# Patient Record
Sex: Female | Born: 1980 | Race: White | Hispanic: No | Marital: Single | Smoking: Current every day smoker
Health system: Southern US, Community
[De-identification: ages and names within clinical notes are randomized; demographics above are authoritative.]

## PROBLEM LIST (undated history)

## (undated) DIAGNOSIS — K219 Gastro-esophageal reflux disease without esophagitis: Secondary | ICD-10-CM

## (undated) DIAGNOSIS — E139 Other specified diabetes mellitus without complications: Secondary | ICD-10-CM

## (undated) DIAGNOSIS — I10 Essential (primary) hypertension: Secondary | ICD-10-CM

## (undated) DIAGNOSIS — K635 Polyp of colon: Secondary | ICD-10-CM

## (undated) DIAGNOSIS — R32 Unspecified urinary incontinence: Secondary | ICD-10-CM

## (undated) DIAGNOSIS — R1900 Intra-abdominal and pelvic swelling, mass and lump, unspecified site: Secondary | ICD-10-CM

## (undated) DIAGNOSIS — M6289 Other specified disorders of muscle: Secondary | ICD-10-CM

## (undated) DIAGNOSIS — F1920 Other psychoactive substance dependence, uncomplicated: Secondary | ICD-10-CM

## (undated) DIAGNOSIS — M1990 Unspecified osteoarthritis, unspecified site: Secondary | ICD-10-CM

## (undated) DIAGNOSIS — N189 Chronic kidney disease, unspecified: Secondary | ICD-10-CM

## (undated) HISTORY — DX: Gastro-esophageal reflux disease without esophagitis: K21.9

## (undated) HISTORY — PX: LYSIS OF ADHESION: SHX5961

## (undated) HISTORY — DX: Unspecified urinary incontinence: R32

## (undated) HISTORY — DX: Polyp of colon: K63.5

## (undated) HISTORY — PX: LAPAROSCOPY: SHX197

---
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
## 2001-11-10 ENCOUNTER — Encounter: Payer: Self-pay | Admitting: General Practice

## 2001-11-10 ENCOUNTER — Encounter: Admission: RE | Admit: 2001-11-10 | Discharge: 2001-11-10 | Payer: Self-pay | Admitting: General Practice

## 2002-03-10 HISTORY — PX: TUBAL LIGATION: SHX77

## 2003-03-11 HISTORY — PX: ABDOMINAL HYSTERECTOMY: SHX81

## 2004-01-22 ENCOUNTER — Emergency Department: Payer: Self-pay | Admitting: Emergency Medicine

## 2004-08-05 ENCOUNTER — Emergency Department: Payer: Self-pay | Admitting: Emergency Medicine

## 2004-11-30 ENCOUNTER — Emergency Department: Payer: Self-pay | Admitting: Emergency Medicine

## 2004-12-10 ENCOUNTER — Ambulatory Visit: Payer: Self-pay | Admitting: Nurse Practitioner

## 2005-10-17 ENCOUNTER — Ambulatory Visit: Payer: Self-pay | Admitting: Family Medicine

## 2006-04-22 ENCOUNTER — Emergency Department: Payer: Self-pay

## 2006-07-08 ENCOUNTER — Ambulatory Visit: Payer: Self-pay | Admitting: Gastroenterology

## 2007-06-04 ENCOUNTER — Emergency Department: Payer: Self-pay | Admitting: Emergency Medicine

## 2007-06-04 ENCOUNTER — Other Ambulatory Visit: Payer: Self-pay

## 2008-06-12 ENCOUNTER — Emergency Department: Payer: Self-pay | Admitting: Internal Medicine

## 2008-11-20 ENCOUNTER — Encounter
Admission: RE | Admit: 2008-11-20 | Discharge: 2008-11-22 | Payer: Self-pay | Admitting: Physical Medicine and Rehabilitation

## 2008-11-22 ENCOUNTER — Ambulatory Visit: Payer: Self-pay | Admitting: Physical Medicine and Rehabilitation

## 2009-01-25 ENCOUNTER — Ambulatory Visit: Payer: Self-pay | Admitting: Obstetrics & Gynecology

## 2009-03-10 HISTORY — PX: VULVECTOMY: SHX1086

## 2009-07-24 ENCOUNTER — Emergency Department: Payer: Self-pay | Admitting: Emergency Medicine

## 2010-03-10 HISTORY — PX: RIGHT OOPHORECTOMY: SHX2359

## 2010-04-01 ENCOUNTER — Encounter: Payer: Self-pay | Admitting: Physical Medicine and Rehabilitation

## 2010-07-26 NOTE — Assessment & Plan Note (Signed)
[name] is a 30 year old single woman who lives with her  boyfriend and takes care of her 32 and 6 year old children.  She has  been referred by Dr. Barkley Bruns.  [name] has a history of bilateral  burning feet and leg pain, which began insidiously in March 2010.  Ms.  Nichole Wiley has been diagnosed with diabetes approximately 11 years ago and is  currently on insulin.  She states that her insulin is not always well  regulated.  She sees an endocrinologist, Dr. Posey Pronto in [facility] for  management when her sugars get particularly bad.   Her other physician is Tomasita Morrow who manages her insulin on a more  regular basis.  She is currently on Lantus as well as NovoLog.   [name] has a chief complaint of bilateral foot burning and pain, which  is rather constant, does seem to get worse as the day goes on.   The first few steps in the morning are not particularly bothersome for  her, it is more towards the end of the day, as she has been up on her  feet more.  She also describes a history of some uterine problems, which  eventually resulted in a hysterectomy back in 2005 for bleeding.   She also has a history of urinary incontinence, which began about 1-2  years ago and she describes having difficulty making it to the bathroom  and may have to change her clothing during the day due to incontinence.   Over the last several months, she has been followed by Dr. Barkley Bruns and  has had multiple injections into her feet including a PRP injection x2.  She states that these injections were not of significant benefit with  respect to her pain.   She has been on other medications in the past for this as well and over  the last several months she has been on hydrocodone up to 7.5 five times  a day.   She describes her average pain is a 9 on a scale of 10, interfering  moderately with her activity level.  Sleep tends be poor.  Pain is worse  with walking, bending, and standing, improves with heat and  medication.  She reports good relief with hydrocodone at this time.   She is able to walk without assistance.  She is up on her feet  intermittently throughout the day.  She can climb stairs and drive.  She  is independent with self-care.  She may need some assistance with  heavier household task.  She does do the laundry and household chores;  however.  Last time she was employed was back in 2002.   REVIEW OF SYSTEMS:  Positive for problems controlling bladder.  Admits  to some fatigue.  Admits to depression and anxiety.  Denies suicidal  ideation.  She also indicates she has intermittent swelling in the feet,  which apparently can be quite significant at times.   PAST MEDICAL HISTORY:  Significant for diabetes x10 years, currently on  insulin.   PAST SURGICAL HISTORY:  In 1987 tonsillectomy and adenoidectomy, 2003 C-  section, 2005 hysterectomy, and 2010 abdominal adhesions.   DRUG ALLERGIES:  To penicillin, she breaks out in a rash.   SOCIAL HISTORY:  The patient lives with her boyfriend and her 25-year-  old daughter and 14-year-old son.  She smokes a pack of cigarettes a day.  She states she is attempting to quit.  She had also been drinking quite  a bit  of sugary soda and she is switched now to diet soda   FAMILY HISTORY:  Positive for heart disease, diabetes, high blood  pressure, as well as disability.   MEDICATIONS:  She comes in with today hydrocodone 7.5/750 five times a  day per Dr. Barkley Bruns.  Lantus, Dr. Tomasita Morrow and NovoLog, Dr. Tomasita Morrow as well.   PHYSICAL EXAMINATION:  Blood pressure is 133/86, pulse 74, respirations  16, 100% saturated on room air.  She is a well-developed, well-nourished  female who does not appear in any distress.  She is oriented x3.  Speech  is clear.  Affect is bright.  She is alert, cooperative, pleasant.  Follows commands without difficulty, answers questions appropriately.   Cranial nerves and coordination are intact.  Her  reflexes are 2+ at  biceps, triceps, brachioradialis, 2+ patellar tendons, 0 at the ankles  bilaterally.  She has diminished sensation from the mid tibia region and  into the foot.  She reports a feeling pinprick, however, that it is  diminished compared to more proximally in her leg.  Her motor strength  is quite good 5/5 at hip flexors, knee extensors, dorsiflexors, plantar  flexors, and EHL.  Straight leg raise is negative.   Transitioning from sitting to standing is done with ease.  Gait is  normal in the room.  Tandem gait.  Romberg test are performed  adequately.  Heel-toe walking is performed without difficulty as well.   Lumbar range of motion is minimally limited.  She has some tenderness in  the upper to mid lumbar segments with palpation.  She has some  limitations in extension.   Examination at hips, does not provoke any pain with good range of  motion.  Knees have full range of motion as well without pain.  She has  full ankle range of motion without pain.  She has significant tenderness  along the midfoot with palpation and some tenderness in the forefoot as  well along the metatarsal heads bilaterally, not a significant amount of  heel pain per se with palpation today.  No instability is noted at  either ankle, AP or medial laterally.   IMPRESSION:  1. Bilateral foot pain and burning x7 months.  2. History of bladder incontinence.  3. Diminished sensation in bilateral feet, with absent reflex at the      Achilles tendons bilaterally.  4. History of foot and ankle swelling.   PLAN:  At this time, etiology of her burning feet is not clear whether  this is associated to her bladder incontinence is also not clear to me.  We would like to pursue further workup on her to include lumbar MRI,  foot radiograph, some blood work as well as electrodiagnostic studies.   Keeping in mind, she has a 10-year history of diabetes, which does not  appear to be well controlled over the  last few years, peripheral  diabetic neuropathy appears to be something that the diagnosis will  definitely consider here.  However, given the bladder problems, need to  rule out any spine issues as well.  We will see her back in 6 weeks  after further testing is completed.  I will await her diagnostic testing  as well.  We will also check urine drug screen.           ______________________________  Franchot Gallo, M.D.     DMK/MedQ  D:  11/22/2008 11:46:49  T:  11/23/2008 03:45:35  Job #:  [id number]  cc:   Dr. Barkley Bruns

## 2010-10-28 ENCOUNTER — Ambulatory Visit: Payer: Self-pay | Admitting: Internal Medicine

## 2010-12-23 ENCOUNTER — Ambulatory Visit: Payer: Self-pay | Admitting: Obstetrics & Gynecology

## 2010-12-26 ENCOUNTER — Inpatient Hospital Stay: Payer: Self-pay | Admitting: Obstetrics & Gynecology

## 2010-12-30 LAB — PATHOLOGY REPORT

## 2011-03-11 LAB — HM PAP SMEAR: HM PAP: NEGATIVE

## 2012-01-20 ENCOUNTER — Emergency Department: Payer: Self-pay | Admitting: Emergency Medicine

## 2012-01-20 LAB — URINALYSIS, COMPLETE
Bilirubin,UR: NEGATIVE
Glucose,UR: >=500 mg/dL (ref 0–75)
Nitrite: POSITIVE
Protein: 30
WBC UR: 444 /HPF (ref 0–5)

## 2012-01-20 LAB — COMPREHENSIVE METABOLIC PANEL
Alkaline Phosphatase: 235 U/L — ABNORMAL HIGH (ref 50–136)
BUN: 10 mg/dL (ref 7–18)
Bilirubin,Total: 0.9 mg/dL (ref 0.2–1.0)
Calcium, Total: 8.7 mg/dL (ref 8.5–10.1)
Chloride: 91 mmol/L — ABNORMAL LOW (ref 98–107)
Co2: 26 mmol/L (ref 21–32)
Creatinine: 1.32 mg/dL — ABNORMAL HIGH (ref 0.60–1.30)
EGFR (African American): >60
EGFR (Non-African Amer.): 53 — ABNORMAL LOW
Glucose: 560 mg/dL (ref 65–99)
SGPT (ALT): 43 U/L (ref 12–78)
Sodium: 127 mmol/L — ABNORMAL LOW (ref 136–145)

## 2012-01-20 LAB — CBC
HGB: 12.8 g/dL (ref 12.0–16.0)
MCH: 30.9 pg (ref 26.0–34.0)
MCHC: 34.6 g/dL (ref 32.0–36.0)
MCV: 89 fL (ref 80–100)
RBC: 4.13 10*6/uL (ref 3.80–5.20)

## 2012-02-04 ENCOUNTER — Emergency Department: Payer: Self-pay | Admitting: Emergency Medicine

## 2012-07-15 ENCOUNTER — Ambulatory Visit: Payer: Self-pay

## 2012-07-23 ENCOUNTER — Emergency Department: Payer: Self-pay | Admitting: Emergency Medicine

## 2013-07-17 ENCOUNTER — Emergency Department: Payer: Self-pay | Admitting: Emergency Medicine

## 2013-08-24 ENCOUNTER — Ambulatory Visit: Payer: Self-pay | Admitting: Family Medicine

## 2015-08-29 ENCOUNTER — Other Ambulatory Visit: Payer: Self-pay

## 2015-08-29 ENCOUNTER — Telehealth: Payer: Self-pay | Admitting: Gastroenterology

## 2015-08-29 NOTE — Telephone Encounter (Signed)
Patient would like to know when she is due for a colonoscopy.

## 2015-08-29 NOTE — Telephone Encounter (Signed)
Pt scheduled for colonoscopy at [facility] on 09/14/15 for constipation K59.0. Please precert

## 2015-08-29 NOTE — Telephone Encounter (Signed)
Returned pt's call and informed her Dr. Allen Norris had seen her when he was at [facility] and I do not have access to when she is due for a colonoscopy. Pt stated she thought he wanted her to repeat in 5 years and it had been longer. Pt stated she was having severe abdominal pain because she was constipated and hadn't been to the bathroom in several days. I had recommended to her to try Miralax, prune juice, stool softeners and to drink lots of water. If these do not work, she can add milk of magnesia. Pt stated she has already tried all of these with no success. After discussing this, I had mentioned to her she may need to go to the ER for the severe abdominal pain because she could have a bowel obstruction. Pt stated she just left [facility] and the physician there had recommend the same thing. She stated she had been having chills, a fever and abdominal pain. I then told pt she really needed to follow the physician's recommendation because she could have some inflammation which is why she has a fever. Pt says she really didn't want to go but I expressed the concern that she really needs to go. She stated she guessed she should go but never said she would said she would.

## 2015-08-29 NOTE — Telephone Encounter (Signed)
Gastroenterology Pre-Procedure Review  Request Date: 09/14/15 Requesting Physician: Dr.   PATIENT REVIEW QUESTIONS: The patient responded to the following health history questions as indicated:    1. Are you having any GI issues? yes (Constipation) 2. Do you have a personal history of Polyps? Yes 3. Do you have a family history of Colon Cancer or Polyps? no 4. Diabetes Mellitus? yes (Type 2) 5. Joint replacements in the past 12 months?no 6. Major health problems in the past 3 months?no 7. Any artificial heart valves, MVP, or defibrillator?no    MEDICATIONS & ALLERGIES:    Patient reports the following regarding taking any anticoagulation/antiplatelet therapy:   Plavix, Coumadin, Eliquis, Xarelto, Lovenox, Pradaxa, Brilinta, or Effient? no Aspirin? no  Patient confirms/reports the following medications:  Current Outpatient Prescriptions  Medication Sig Dispense Refill  . insulin aspart (NOVOLOG) 100 UNIT/ML injection Inject into the skin.    . methadone (DOLOPHINE) 10 MG tablet Take 10 mg by mouth.     No current facility-administered medications for this visit.    Patient confirms/reports the following allergies:  Allergies  Allergen Reactions  . Cephalexin Other (See Comments)    Unknown  . Penicillins Rash    No orders of the defined types were placed in this encounter.    AUTHORIZATION INFORMATION Primary Insurance: 1D#: Group #:  Secondary Insurance: 1D#: Group #:  SCHEDULE INFORMATION: Date: 09/14/15 Time: Location: [place]

## 2015-09-07 ENCOUNTER — Encounter: Payer: Self-pay | Admitting: *Deleted

## 2015-09-13 NOTE — Discharge Instructions (Signed)

## 2015-09-14 ENCOUNTER — Ambulatory Visit
Admission: RE | Admit: 2015-09-14 | Discharge: 2015-09-14 | Disposition: A | Discharge status: Home / Self Care | Payer: Medicaid Other | Source: Ambulatory Visit | Attending: Gastroenterology | Admitting: Gastroenterology

## 2015-09-14 ENCOUNTER — Encounter
Admission: RE | Disposition: A | Discharge status: Home / Self Care | Payer: Self-pay | Source: Ambulatory Visit | Attending: Gastroenterology

## 2015-09-14 ENCOUNTER — Ambulatory Visit: Payer: Medicaid Other | Admitting: Anesthesiology

## 2015-09-14 DIAGNOSIS — Z794 Long term (current) use of insulin: Secondary | ICD-10-CM | POA: Diagnosis not present

## 2015-09-14 DIAGNOSIS — Z880 Allergy status to penicillin: Secondary | ICD-10-CM | POA: Insufficient documentation

## 2015-09-14 DIAGNOSIS — K635 Polyp of colon: Secondary | ICD-10-CM | POA: Insufficient documentation

## 2015-09-14 DIAGNOSIS — D125 Benign neoplasm of sigmoid colon: Secondary | ICD-10-CM | POA: Insufficient documentation

## 2015-09-14 DIAGNOSIS — K5900 Constipation, unspecified: Secondary | ICD-10-CM

## 2015-09-14 DIAGNOSIS — K5909 Other constipation: Secondary | ICD-10-CM | POA: Insufficient documentation

## 2015-09-14 DIAGNOSIS — Z90710 Acquired absence of both cervix and uterus: Secondary | ICD-10-CM | POA: Insufficient documentation

## 2015-09-14 DIAGNOSIS — Z9889 Other specified postprocedural states: Secondary | ICD-10-CM | POA: Insufficient documentation

## 2015-09-14 DIAGNOSIS — M479 Spondylosis, unspecified: Secondary | ICD-10-CM | POA: Diagnosis not present

## 2015-09-14 DIAGNOSIS — Z881 Allergy status to other antibiotic agents status: Secondary | ICD-10-CM | POA: Insufficient documentation

## 2015-09-14 DIAGNOSIS — Z79891 Long term (current) use of opiate analgesic: Secondary | ICD-10-CM | POA: Insufficient documentation

## 2015-09-14 DIAGNOSIS — F17210 Nicotine dependence, cigarettes, uncomplicated: Secondary | ICD-10-CM | POA: Insufficient documentation

## 2015-09-14 DIAGNOSIS — E109 Type 1 diabetes mellitus without complications: Secondary | ICD-10-CM | POA: Insufficient documentation

## 2015-09-14 DIAGNOSIS — D124 Benign neoplasm of descending colon: Secondary | ICD-10-CM | POA: Insufficient documentation

## 2015-09-14 HISTORY — PX: POLYPECTOMY: SHX5525

## 2015-09-14 HISTORY — DX: Other specified diabetes mellitus without complications: E13.9

## 2015-09-14 HISTORY — PX: COLONOSCOPY WITH PROPOFOL: SHX5780

## 2015-09-14 HISTORY — DX: Other specified disorders of muscle: M62.89

## 2015-09-14 HISTORY — DX: Unspecified osteoarthritis, unspecified site: M19.90

## 2015-09-14 HISTORY — DX: Other psychoactive substance dependence, uncomplicated: F19.20

## 2015-09-14 LAB — GLUCOSE, CAPILLARY
GLUCOSE-CAPILLARY: 296 mg/dL — AB (ref 65–99)
Glucose-Capillary: 345 mg/dL — ABNORMAL HIGH (ref 65–99)

## 2015-09-14 LAB — HM COLONOSCOPY

## 2015-09-14 SURGERY — COLONOSCOPY WITH PROPOFOL
Anesthesia: Monitor Anesthesia Care | Wound class: Contaminated

## 2015-09-14 MED ORDER — LIDOCAINE HCL (CARDIAC) 20 MG/ML IV SOLN
INTRAVENOUS | Status: DC | PRN
  Administered 2015-09-14: 50 mg via INTRAVENOUS

## 2015-09-14 MED ORDER — PROPOFOL 10 MG/ML IV BOLUS
INTRAVENOUS | Status: DC | PRN
  Administered 2015-09-14: 20 mg via INTRAVENOUS
  Administered 2015-09-14: 40 mg via INTRAVENOUS
  Administered 2015-09-14: 20 mg via INTRAVENOUS
  Administered 2015-09-14: 10 mg via INTRAVENOUS
  Administered 2015-09-14: 30 mg via INTRAVENOUS
  Administered 2015-09-14: 20 mg via INTRAVENOUS
  Administered 2015-09-14: 40 mg via INTRAVENOUS
  Administered 2015-09-14: 20 mg via INTRAVENOUS
  Administered 2015-09-14: 30 mg via INTRAVENOUS
  Administered 2015-09-14 (×2): 20 mg via INTRAVENOUS
  Administered 2015-09-14: 70 mg via INTRAVENOUS
  Administered 2015-09-14 (×2): 30 mg via INTRAVENOUS
  Administered 2015-09-14: 20 mg via INTRAVENOUS

## 2015-09-14 MED ORDER — STERILE WATER FOR IRRIGATION IR SOLN
Status: DC | PRN
  Administered 2015-09-14: 08:00:00

## 2015-09-14 MED ORDER — LACTATED RINGERS IV SOLN
INTRAVENOUS | Status: DC
  Administered 2015-09-14: 08:00:00 via INTRAVENOUS

## 2015-09-14 SURGICAL SUPPLY — 23 items
CANISTER SUCT 1200ML W/VALVE (MISCELLANEOUS) ×4 IMPLANT
CLIP HMST 235XBRD CATH ROT (MISCELLANEOUS) IMPLANT
CLIP RESOLUTION 360 11X235 (MISCELLANEOUS)
FCP ESCP3.2XJMB 240X2.8X (MISCELLANEOUS)
FORCEPS BIOP RAD 4 LRG CAP 4 (CUTTING FORCEPS) IMPLANT
FORCEPS BIOP RJ4 240 W/NDL (MISCELLANEOUS)
FORCEPS ESCP3.2XJMB 240X2.8X (MISCELLANEOUS) IMPLANT
GOWN CVR UNV OPN BCK APRN NK (MISCELLANEOUS) ×4 IMPLANT
GOWN ISOL THUMB LOOP REG UNIV (MISCELLANEOUS) ×4
INJECTOR VARIJECT VIN23 (MISCELLANEOUS) IMPLANT
KIT DEFENDO VALVE AND CONN (KITS) IMPLANT
KIT ENDO PROCEDURE OLY (KITS) ×4 IMPLANT
MARKER SPOT ENDO TATTOO 5ML (MISCELLANEOUS) IMPLANT
PAD GROUND ADULT SPLIT (MISCELLANEOUS) IMPLANT
PROBE APC STR FIRE (PROBE) IMPLANT
RETRIEVER NET ROTH 2.5X230 LF (MISCELLANEOUS) ×4 IMPLANT
SNARE SHORT THROW 13M SML OVAL (MISCELLANEOUS) ×4 IMPLANT
SNARE SHORT THROW 30M LRG OVAL (MISCELLANEOUS) IMPLANT
SNARE SNG USE RND 15MM (INSTRUMENTS) IMPLANT
SPOT EX ENDOSCOPIC TATTOO (MISCELLANEOUS)
TRAP ETRAP POLY (MISCELLANEOUS) ×4 IMPLANT
VARIJECT INJECTOR VIN23 (MISCELLANEOUS)
WATER STERILE IRR 250ML POUR (IV SOLUTION) ×4 IMPLANT

## 2015-09-14 NOTE — Anesthesia Preprocedure Evaluation (Signed)
Anesthesia Evaluation  Patient identified by MRN, date of birth, ID band Patient awake    Airway Mallampati: II  TM Distance: >3 FB Neck ROM: Full    Dental   Pulmonary Current Smoker,           Cardiovascular      Neuro/Psych    GI/Hepatic   Endo/Other  diabetes  Renal/GU      Musculoskeletal   Abdominal   Peds  Hematology   Anesthesia Other Findings   Reproductive/Obstetrics                             Anesthesia Physical Anesthesia Plan  ASA: III  Anesthesia Plan: MAC   Post-op Pain Management:    Induction: Intravenous  Airway Management Planned:   Additional Equipment:   Intra-op Plan:   Post-operative Plan:   Informed Consent: I have reviewed the patients History and Physical, chart, labs and discussed the procedure including the risks, benefits and alternatives for the proposed anesthesia with the patient or authorized representative who has indicated his/her understanding and acceptance.     Plan Discussed with: CRNA  Anesthesia Plan Comments:         Anesthesia Quick Evaluation

## 2015-09-14 NOTE — Transfer of Care (Signed)
Immediate Anesthesia Transfer of Care Note  Patient: Nichole Wiley  Procedure(s) Performed: Procedure(s) with comments: COLONOSCOPY WITH PROPOFOL (N/A) - Diabetic - insulin POLYPECTOMY  Patient Location: PACU  Anesthesia Type: MAC  Level of Consciousness: awake, alert  and patient cooperative  Airway and Oxygen Therapy: Patient Spontanous Breathing and Patient connected to supplemental oxygen  Post-op Assessment: Post-op Vital signs reviewed, Patient's Cardiovascular Status Stable, Respiratory Function Stable, Patent Airway and No signs of Nausea or vomiting  Post-op Vital Signs: Reviewed and stable  Complications: No apparent anesthesia complications

## 2015-09-14 NOTE — Op Note (Signed)
[facility] Gastroenterology Patient Name: Nichole Wiley Procedure Date: 09/14/2015 7:34 AM MRN: [id number] Account #: [id number] Date of Birth: 04-Jun-1980 Admit Type: Outpatient Age: 35 Room: [facility] OR ROOM 01 Gender: Female Note Status: Finalized Procedure:            Colonoscopy Indications:          Constipation Providers:            Lucilla Lame, MD Referring MD:         Leata Mouse (Referring MD) Medicines:            Propofol per Anesthesia Complications:        No immediate complications. Procedure:            Pre-Anesthesia Assessment:                       - Prior to the procedure, a History and Physical was                        performed, and patient medications and allergies were                        reviewed. The patient's tolerance of previous                        anesthesia was also reviewed. The risks and benefits of                        the procedure and the sedation options and risks were                        discussed with the patient. All questions were                        answered, and informed consent was obtained. Prior                        Anticoagulants: The patient has taken no previous                        anticoagulant or antiplatelet agents. ASA Grade                        Assessment: II - A patient with mild systemic disease.                        After reviewing the risks and benefits, the patient was                        deemed in satisfactory condition to undergo the                        procedure.                       After obtaining informed consent, the colonoscope was                        passed under direct vision. Throughout the procedure,  the patient's blood pressure, pulse, and oxygen                        saturations were monitored continuously. The                        Colonoscope was introduced through the anus and                        advanced to the the cecum,  identified by appendiceal                        orifice and ileocecal valve. The colonoscopy was                        performed without difficulty. The patient tolerated the                        procedure well. The quality of the bowel preparation                        was good. Findings:      The perianal and digital rectal examinations were normal.      A 5 mm polyp was found in the sigmoid colon. The polyp was sessile. The       polyp was removed with a cold snare. Resection and retrieval were       complete.      A 3 mm polyp was found in the descending colon. The polyp was sessile.       The polyp was removed with a cold biopsy forceps. Resection and       retrieval were complete.      Three sessile polyps were found in the sigmoid colon. The polyps were 2       to 5 mm in size. These polyps were removed with a cold biopsy forceps.       Resection and retrieval were complete. Impression:           - One 5 mm polyp in the sigmoid colon, removed with a                        cold snare. Resected and retrieved.                       - One 3 mm polyp in the descending colon, removed with                        a cold biopsy forceps. Resected and retrieved.                       - Three 2 to 5 mm polyps in the sigmoid colon, removed                        with a cold biopsy forceps. Resected and retrieved. Recommendation:       - Await pathology results. Procedure Code(s):    --- Professional ---                       [phone], Colonoscopy, flexible; with removal of tumor(s),  polyp(s), or other lesion(s) by snare technique                       [id number], 59, Colonoscopy, flexible; with biopsy, single                        or multiple Diagnosis Code(s):    --- Professional ---                       K59.00, Constipation, unspecified                       D12.4, Benign neoplasm of descending colon                       D12.5, Benign neoplasm of sigmoid colon CPT  copyright 2016 American Medical Association. All rights reserved. The codes documented in this report are preliminary and upon coder review may  be revised to meet current compliance requirements. Lucilla Lame, MD 09/14/2015 8:08:58 AM This report has been signed electronically. Number of Addenda: 0 Note Initiated On: 09/14/2015 7:34 AM Scope Withdrawal Time: 0 hours 7 minutes 0 seconds  Total Procedure Duration: 0 hours 16 minutes 20 seconds       [facility]

## 2015-09-14 NOTE — Anesthesia Procedure Notes (Signed)
Procedure Name: MAC Performed by: Gerard Bonus Pre-anesthesia Checklist: Patient identified, Emergency Drugs available, Suction available, Timeout performed and Patient being monitored Patient Re-evaluated:Patient Re-evaluated prior to inductionOxygen Delivery Method: Nasal cannula Placement Confirmation: positive ETCO2       

## 2015-09-14 NOTE — H&P (Signed)
  Nichole Lame, MD [address] [place], [place] [address] Phone: [phone] Fax : [phone]  Primary Care Physician:  Nichole Mouse, NP Primary Gastroenterologist:  Dr. Allen Wiley  Pre-Procedure History & Physical: HPI:  Nichole Wiley is a 35 y.o. female is here for an colonoscopy.   Past Medical History  Diagnosis Date  . Arthritis     back  . Drug addiction ([place])     hx (pain pills after c-sect)  . Pelvic floor dysfunction   . 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 Tonsillectomy  1987    and adenoids  . Cesarean section  2003  . Tubal ligation  2004  . Abdominal hysterectomy  2005  . Right oophorectomy  2012  . Laparoscopy    . Lysis of adhesion      Prior to Admission medications   Medication Sig Start Date End Date Taking? Authorizing Provider  insulin aspart (NOVOLOG) 100 UNIT/ML injection Inject into the skin.   Yes Historical Provider, MD  insulin glargine (LANTUS) 100 UNIT/ML injection Inject 52 Units into the skin at bedtime.   Yes Historical Provider, MD  methadone (DOLOPHINE) 10 MG/ML solution Take 170 mg by mouth daily.   Yes Historical Provider, MD  methadone (DOLOPHINE) 10 MG tablet Take 10 mg by mouth.    Historical Provider, MD    Allergies as of 08/29/2015 - never reviewed  Allergen Reaction Noted  . Cephalexin Other (See Comments) 08/29/2015  . Penicillins Rash 08/29/2015    History reviewed. No pertinent family history.  Social History   Social History  . Marital Status: Single    Spouse Name: N/A  . Number of Children: N/A  . Years of Education: N/A   Occupational History  . Not on file.   Social History Main Topics  . Smoking status: Current Every Day Smoker -- 0.50 packs/day for 24 years    Types: Cigarettes  . Smokeless tobacco: Not on file     Comment: has cut down from 2 PPD  . Alcohol Use: No  . Drug Use: Not on file  . Sexual Activity: Not on file    Other Topics Concern  . Not on file   Social History Narrative    Review of Systems: See HPI, otherwise negative ROS  Physical Exam: BP 106/82 mmHg  Pulse 84  Temp(Src) 97.9 F (36.6 C) (Temporal)  Ht 5\' 6"  (1.676 m)  Wt 168 lb (76.204 kg)  BMI 27.13 kg/m2  SpO2 100% General:   Alert,  pleasant and cooperative in NAD Head:  Normocephalic and atraumatic. Neck:  Supple; no masses or thyromegaly. Lungs:  Clear throughout to auscultation.    Heart:  Regular rate and rhythm. Abdomen:  Soft, nontender and nondistended. Normal bowel sounds, without guarding, and without rebound.   Neurologic:  Alert and  oriented x4;  grossly normal neurologically.  Impression/Plan: Nichole Wiley is here for an colonoscopy to be performed for constipation  Risks, benefits, limitations, and alternatives regarding  colonoscopy have been reviewed with the patient.  Questions have been answered.  All parties agreeable.   Nichole Lame, MD  09/14/2015, 7:28 AM

## 2015-09-14 NOTE — Anesthesia Postprocedure Evaluation (Signed)
Anesthesia Post Note  Patient: Nichole Wiley  Procedure(s) Performed: Procedure(s) (LRB): COLONOSCOPY WITH PROPOFOL (N/A) POLYPECTOMY  Patient location during evaluation: PACU Anesthesia Type: General Level of consciousness: awake and alert Pain management: pain level controlled Vital Signs Assessment: post-procedure vital signs reviewed and stable Respiratory status: spontaneous breathing, nonlabored ventilation, respiratory function stable and patient connected to nasal cannula oxygen Cardiovascular status: blood pressure returned to baseline and stable Postop Assessment: no signs of nausea or vomiting Anesthetic complications: no    Marshell Levan

## 2015-09-17 ENCOUNTER — Encounter: Payer: Self-pay | Admitting: Gastroenterology

## 2015-09-18 ENCOUNTER — Encounter: Payer: Self-pay | Admitting: Gastroenterology

## 2015-09-20 ENCOUNTER — Telehealth: Payer: Self-pay | Admitting: Gastroenterology

## 2015-09-20 NOTE — Telephone Encounter (Signed)
Advised pt she may not have a bowel movement a week or so after her colonoscopy due to the cleansing she received from the prep. Advised her to continue taking her fiber but make sure she is drinking lots of water and try using Probiotics. If she doesn't have a bowel movement by early next week she is to call back. She stated the reason she had the colonoscopy in the first place was due to constipation. I informed her the prep was good so she may not be producing a lot of stool.

## 2015-09-20 NOTE — Telephone Encounter (Signed)
Patient had a colonoscopy on 7/7 and hasn't had a bowel movement since. She has been using miralax. Please call

## 2015-09-28 ENCOUNTER — Encounter: Payer: Self-pay | Admitting: Family Medicine

## 2015-09-28 ENCOUNTER — Ambulatory Visit
Admission: RE | Admit: 2015-09-28 | Discharge: 2015-09-28 | Disposition: A | Discharge status: Home / Self Care | Payer: Medicaid Other | Source: Ambulatory Visit | Attending: Family Medicine | Admitting: Family Medicine

## 2015-09-28 ENCOUNTER — Telehealth: Payer: Self-pay | Admitting: Family Medicine

## 2015-09-28 ENCOUNTER — Ambulatory Visit (INDEPENDENT_AMBULATORY_CARE_PROVIDER_SITE_OTHER): Payer: Medicaid Other | Admitting: Family Medicine

## 2015-09-28 VITALS — BP 113/77 | HR 69 | Temp 98.2°F | Resp 16 | Ht 66.0 in | Wt 172.0 lb

## 2015-09-28 DIAGNOSIS — M79661 Pain in right lower leg: Secondary | ICD-10-CM | POA: Diagnosis not present

## 2015-09-28 DIAGNOSIS — M7989 Other specified soft tissue disorders: Secondary | ICD-10-CM | POA: Insufficient documentation

## 2015-09-28 DIAGNOSIS — R1084 Generalized abdominal pain: Secondary | ICD-10-CM

## 2015-09-28 DIAGNOSIS — K5900 Constipation, unspecified: Secondary | ICD-10-CM | POA: Insufficient documentation

## 2015-09-28 DIAGNOSIS — E109 Type 1 diabetes mellitus without complications: Secondary | ICD-10-CM | POA: Insufficient documentation

## 2015-09-28 DIAGNOSIS — K5909 Other constipation: Secondary | ICD-10-CM

## 2015-09-28 DIAGNOSIS — R110 Nausea: Secondary | ICD-10-CM | POA: Insufficient documentation

## 2015-09-28 LAB — COMPLETE METABOLIC PANEL WITH GFR
ALT: 15 U/L (ref 6–29)
AST: 8 U/L — AB (ref 10–30)
Albumin: 3.7 g/dL (ref 3.6–5.1)
Alkaline Phosphatase: 111 U/L (ref 33–115)
BILIRUBIN TOTAL: 0.2 mg/dL (ref 0.2–1.2)
BUN: 16 mg/dL (ref 7–25)
CHLORIDE: 103 mmol/L (ref 98–110)
CO2: 24 mmol/L (ref 20–31)
Calcium: 9 mg/dL (ref 8.6–10.2)
Creat: 1.01 mg/dL (ref 0.50–1.10)
GFR, EST AFRICAN AMERICAN: 82 mL/min (ref >=60)
GFR, EST NON AFRICAN AMERICAN: 71 mL/min (ref >=60)
Glucose, Bld: 364 mg/dL — ABNORMAL HIGH (ref 65–99)
Potassium: 4.3 mmol/L (ref 3.5–5.3)
SODIUM: 136 mmol/L (ref 135–146)
Total Protein: 6.3 g/dL (ref 6.1–8.1)

## 2015-09-28 LAB — LIPID PANEL
CHOL/HDL RATIO: 5.6 ratio — AB (ref <=5.0)
CHOLESTEROL: 242 mg/dL — AB (ref 125–200)
HDL: 43 mg/dL — ABNORMAL LOW (ref >=46)
LDL Cholesterol: 163 mg/dL — ABNORMAL HIGH (ref <130)
TRIGLYCERIDES: 181 mg/dL — AB (ref <150)
VLDL: 36 mg/dL — AB (ref <30)

## 2015-09-28 LAB — TSH: TSH: 5.42 mIU/L — ABNORMAL HIGH

## 2015-09-28 LAB — AMYLASE: Amylase: 10 U/L (ref 0–105)

## 2015-09-28 LAB — LIPASE: Lipase: 8 U/L (ref 7–60)

## 2015-09-28 MED ORDER — INSULIN PEN NEEDLE 31G X 5 MM MISC: 1.0000 | Freq: Every day | Status: DC

## 2015-09-28 MED ORDER — FUROSEMIDE 20 MG PO TABS: 20.0000 mg | ORAL_TABLET | Freq: Every day | ORAL | Status: DC

## 2015-09-28 MED ORDER — INSULIN GLARGINE 100 UNIT/ML SOLOSTAR PEN
52.0000 [IU] | PEN_INJECTOR | Freq: Every day | SUBCUTANEOUS | Status: DC
Start: 2015-09-28 — End: 2017-06-07

## 2015-09-28 MED ORDER — INSULIN GLARGINE 100 UNIT/ML ~~LOC~~ SOLN: 52.0000 [IU] | Freq: Every day | SUBCUTANEOUS | Status: DC

## 2015-09-28 MED ORDER — ONDANSETRON HCL 4 MG PO TABS: 4.0000 mg | ORAL_TABLET | Freq: Three times a day (TID) | ORAL | Status: DC | PRN

## 2015-09-28 NOTE — Assessment & Plan Note (Signed)
XR to determine if theres is stool burden. Pt encouraged to call Dr. Dorothey Baseman office to follow-up 2/2 recent colonoscopy. Continue home treatment of constipation.

## 2015-09-28 NOTE — Progress Notes (Signed)
Subjective:    Patient ID: Nichole Wiley, female    DOB: Nov 09, 1980, 35 y.o.   MRN: [id number]  HPI: Nichole Wiley is a 35 y.o. female presenting on 09/28/2015 for Establish Care   HPI  Pt presents to establish care today. Previous care provider was [employer].  It has been 6 months since Her last PCP visit. Records from previous provider will be requested and reviewed. Current medical problems include:  Diabetes: Type 1.5 per patient report- dx age 60. Having trouble keeping numbers balanced. Seeing Endocrine at [facility]? She cannot remember his name. Last A1c was 13.0%- 2 mos ago. Taking 52 units Lantus at bedtime. She is taking novolog- TID sliding scale- 3 for every 50 above 150 ; She has been out of lantus- due to not enough. UA micro UTD. Eye exam done yearly- not done this year. Was at [facility].  Abdominal pain- intense nausea. May 19- last time she had BM prior to colonoscopy on 7/7. She is having an issues having BM since colonoscopy. Drinking water. Taking miralax and metamucil. Stomach is hurting. BM's are sporadic. Irregular BM's over past few months. Every few months has severe cramping abdominal pain and then needs to have BM. While she is on the toilet- she feels sweaty, lightheaded. Once she has BM after about 10 minutes- she feels better. Has nausea every day. All day long. Has tried tums/ rolaids. No help. Takes prevacid OTC. Has never had an EGD.  No vomiting. Nausea sometimes worsens after meals.  Foot swelling- daily for 2 years. R>L. No redness. Elevates at night. Has [place] in March to r/o blood clot.  Methadone: [facility] in [place] every Wednesday. Gets take home. Has been on methadone.   Health maintenance:  Colonoscopy for constipation 7/7- normal, polyps Hysterectomy- bleeding. 2005.    Past Medical History  Diagnosis Date  . Arthritis     back  . Drug addiction ([place])     hx (pain pills after c-sect)  . Pelvic floor dysfunction   .  Diabetes 1.5, managed as type 1 ([place])   . Colon polyp   . GERD (gastroesophageal reflux disease)   . Urine incontinence    Social History   Social History  . Marital Status: Single    Spouse Name: N/A  . Number of Children: N/A  . Years of Education: N/A   Occupational History  . Not on file.   Social History Main Topics  . Smoking status: Current Every Day Smoker -- 1.00 packs/day for 24 years    Types: Cigarettes  . Smokeless tobacco: Not on file     Comment: has cut down from 2 PPD  . Alcohol Use: No  . Drug Use: No  . Sexual Activity: Not on file   Other Topics Concern  . Not on file   Social History Narrative   Family History  Problem Relation Age of Onset  . Depression Mother   . Diabetes Father   . Alcohol abuse Father   . Heart disease Maternal Grandmother   . Depression Maternal Grandmother   . Dementia Maternal Grandmother    Current Outpatient Prescriptions on File Prior to Visit  Medication Sig  . insulin aspart (NOVOLOG) 100 UNIT/ML injection Inject into the skin.  . methadone (DOLOPHINE) 10 MG/ML solution Take 170 mg by mouth daily.   No current facility-administered medications on file prior to visit.    Review of Systems  Constitutional: Negative for fever and  chills.  HENT: Negative.   Respiratory: Negative for cough, chest tightness and wheezing.   Cardiovascular: Negative for chest pain and leg swelling.  Gastrointestinal: Positive for nausea, constipation and abdominal distention. Negative for vomiting, abdominal pain and diarrhea.  Endocrine: Negative.  Negative for cold intolerance, heat intolerance, polydipsia, polyphagia and polyuria.  Genitourinary: Negative for dysuria and difficulty urinating.  Musculoskeletal: Negative.   Neurological: Negative for dizziness, light-headedness and numbness.  Psychiatric/Behavioral: Negative.    Per HPI unless specifically indicated above     Objective:    BP 113/77 mmHg  Pulse 69  Temp(Src)  98.2 F (36.8 C) (Oral)  Resp 16  Ht 5\' 6"  (1.676 m)  Wt 172 lb (78.019 kg)  BMI 27.77 kg/m2  Wt Readings from Last 3 Encounters:  09/28/15 172 lb (78.019 kg)  09/14/15 168 lb (76.204 kg)    Physical Exam  Constitutional: She is oriented to person, place, and time. She appears well-developed and well-nourished.  HENT:  Head: Normocephalic and atraumatic.  Neck: Neck supple.  Cardiovascular: Normal rate, regular rhythm and normal heart sounds.  Exam reveals no gallop and no friction rub.   No murmur heard. Pulmonary/Chest: Effort normal and breath sounds normal. She has no wheezes. She exhibits no tenderness.  Abdominal: Soft. Normal appearance and bowel sounds are normal. She exhibits no distension and no mass. There is generalized tenderness. There is no rebound, no guarding, no CVA tenderness, no tenderness at McBurney's point and negative Murphy's sign.  Musculoskeletal: Normal range of motion. She exhibits no edema or tenderness.  Lymphadenopathy:    She has no cervical adenopathy.  Neurological: She is alert and oriented to person, place, and time.  Skin: Skin is warm and dry.  Psychiatric: She has a normal mood and affect. Her behavior is normal. Judgment and thought content normal.   Results for orders placed or performed in visit on 09/28/15  HM PAP SMEAR  Result Value Ref Range   HM Pap smear negative   HM COLONOSCOPY  Result Value Ref Range   HM Colonoscopy See Report See Report, Patient Reported Normal      Assessment & Plan:   Problem List Items Addressed This Visit      Digestive   Chronic constipation    XR to determine if theres is stool burden. Pt encouraged to call Dr. Dorothey Baseman office to follow-up 2/2 recent colonoscopy. Continue home treatment of constipation.       Relevant Orders   DG Abd 1 View   Ambulatory referral to General Surgery     Endocrine   Type 1 diabetes mellitus on insulin therapy ([place]) - Primary    Pt would like to change  endocrinologist. Will refer to [facility] today. Continue with recommendations of endocrinologist. UTD to foot exam and UA micro.  Pt will call [facility] to schedule her eye exam.       Relevant Medications   Insulin Pen Needle 31G X 5 MM MISC   Other Relevant Orders   Lipid Profile   Ambulatory referral to Endocrinology     Other   Nausea    Potential diabetic gastroparesis. Check labwork today. Trial of PRN zofran for nausea. Follow-up with gastroenterology.       Relevant Medications   ondansetron (ZOFRAN) 4 MG tablet   Other Relevant Orders   Ambulatory referral to General Surgery   Leg swelling    PRN lasix for leg swelling. Check TSH, CMP.  Encouraged compression stockings, hydration, and  elevation. [place] 2/2 uneven swelling.  Recheck 1 mos.       Relevant Medications   furosemide (LASIX) 20 MG tablet   Other Relevant Orders   COMPLETE METABOLIC PANEL WITH GFR   TSH   Generalized abdominal pain    Symptoms consistent with vasovagal response while toileting. Pt to seek immediate medical attention for syncope vs presyncope. KUB to look for stool burden.       Relevant Orders   Amylase   Lipase   DG Abd 1 View   Ambulatory referral to General Surgery    Other Visit Diagnoses    Pain and swelling of lower leg, right        [place] to r/o DVT.     Relevant Orders    US Venous Img Lower Unilateral Right    D-Dimer, Quantitative       Meds ordered this encounter  Medications  . furosemide (LASIX) 20 MG tablet    Sig: Take 1 tablet (20 mg total) by mouth daily.    Dispense:  30 tablet    Refill:  5    Order Specific Question:  Supervising Provider    Answer:  Arlis Porta [id number]  . ondansetron (ZOFRAN) 4 MG tablet    Sig: Take 1 tablet (4 mg total) by mouth every 8 (eight) hours as needed for nausea or vomiting.    Dispense:  20 tablet    Refill:  1    Order Specific Question:  Supervising Provider    Answer:  Arlis Porta [phone]  .  DISCONTD: insulin glargine (LANTUS) 100 UNIT/ML injection    Sig: Inject 0.52 mLs (52 Units total) into the skin at bedtime.    Dispense:  15 mL    Refill:  11    Order Specific Question:  Supervising Provider    Answer:  Arlis Porta [phone]  . Insulin Pen Needle 31G X 5 MM MISC    Sig: 1 each by Does not apply route daily.    Dispense:  100 each    Refill:  5    Order Specific Question:  Supervising Provider    Answer:  Arlis Porta [id number]      Follow up plan: Return in about 4 weeks (around 10/26/2015) for leg swelling. Marland Kitchen

## 2015-09-28 NOTE — Assessment & Plan Note (Signed)
Pt would like to change endocrinologist. Will refer to [facility] today. Continue with recommendations of endocrinologist. UTD to foot exam and UA micro.  Pt will call [facility] to schedule her eye exam.

## 2015-09-28 NOTE — Telephone Encounter (Signed)
Nichole Wiley at [facility] in [place] has a question about the prescription for pen needles.  Her call back number is [phone]

## 2015-09-28 NOTE — Telephone Encounter (Signed)
Correct to be solostar pens.

## 2015-09-28 NOTE — Assessment & Plan Note (Addendum)
Symptoms consistent with vasovagal response while toileting. Pt to seek immediate medical attention for syncope vs presyncope. KUB to look for stool burden.

## 2015-09-28 NOTE — Assessment & Plan Note (Signed)
Potential diabetic gastroparesis. Check labwork today. Trial of PRN zofran for nausea. Follow-up with gastroenterology.

## 2015-09-28 NOTE — Assessment & Plan Note (Addendum)
PRN lasix for leg swelling. Check TSH, CMP.  Encouraged compression stockings, hydration, and elevation. [place] 2/2 uneven swelling.  Recheck 1 mos.

## 2015-09-28 NOTE — Patient Instructions (Signed)
We will get XR of belly to determine if you are making stool. Please call Dr. Dorothey Baseman office to discuss your lack of BM since colonoscopy.   We will have you seen by Dr. Allen Norris to discuss abdominal pain, nausea, and constipation. Please go to ER for severe abdominal pain.   We will get lab work to work-up your leg swelling. Please take once daily in the AM.

## 2015-09-29 LAB — D-DIMER, QUANTITATIVE (NOT AT ARMC): D DIMER QUANT: 0.55 ug{FEU}/mL — AB (ref <0.50)

## 2015-10-01 ENCOUNTER — Telehealth: Payer: Self-pay | Admitting: Family Medicine

## 2015-10-01 ENCOUNTER — Other Ambulatory Visit: Payer: Self-pay | Admitting: Family Medicine

## 2015-10-01 DIAGNOSIS — M7989 Other specified soft tissue disorders: Secondary | ICD-10-CM

## 2015-10-01 DIAGNOSIS — M79605 Pain in left leg: Secondary | ICD-10-CM

## 2015-10-01 DIAGNOSIS — R7989 Other specified abnormal findings of blood chemistry: Secondary | ICD-10-CM

## 2015-10-01 MED ORDER — BISACODYL 5 MG PO TBEC: 5.0000 mg | DELAYED_RELEASE_TABLET | Freq: Every evening | ORAL | 0 refills | Status: DC | PRN

## 2015-10-01 NOTE — Telephone Encounter (Signed)
LMTCB

## 2015-10-01 NOTE — Telephone Encounter (Signed)
Called pt in attempt to reach her about + d-dimer. Cell phone not in service. LMTCB on home number.  If she calls back, her D dimer was positive and I would like to change her ultrasound to stat today to rule out a clot in her leg. I know she has had a negative in the past- but due to elevated D dimer- would like to rule it out again. Thanks! AK

## 2015-10-02 ENCOUNTER — Encounter: Payer: Self-pay | Admitting: *Deleted

## 2015-10-02 NOTE — Telephone Encounter (Signed)
LMTCB as stat.

## 2015-10-02 NOTE — Telephone Encounter (Signed)
Several attempts made to contact patient. Certified letter mailed out for patient to contact the office.

## 2015-10-02 NOTE — Telephone Encounter (Signed)
Call and left message for patient to call office ASAP.

## 2015-10-03 ENCOUNTER — Telehealth: Payer: Self-pay | Admitting: Gastroenterology

## 2015-10-03 NOTE — Telephone Encounter (Signed)
I have called to make an appointment with GI per referral. No answer. I have left a detailed message for patient to call to make an appointment.

## 2015-10-08 ENCOUNTER — Telehealth: Payer: Self-pay | Admitting: Family Medicine

## 2015-10-08 ENCOUNTER — Other Ambulatory Visit: Payer: Self-pay | Admitting: Family Medicine

## 2015-10-08 DIAGNOSIS — R7989 Other specified abnormal findings of blood chemistry: Secondary | ICD-10-CM

## 2015-10-08 DIAGNOSIS — M7989 Other specified soft tissue disorders: Secondary | ICD-10-CM

## 2015-10-08 DIAGNOSIS — M79662 Pain in left lower leg: Secondary | ICD-10-CM

## 2015-10-08 DIAGNOSIS — M79605 Pain in left leg: Secondary | ICD-10-CM

## 2015-10-08 NOTE — Telephone Encounter (Signed)
I released the order I placed last Monday. Please call to schedule. Thanks! AK

## 2015-10-08 NOTE — Addendum Note (Signed)
Addended byVincenza Hews, AMY L on: 10/08/2015 03:55 PM   Modules accepted: Orders

## 2015-10-08 NOTE — Telephone Encounter (Signed)
Pt advised on phone her number was wrong and changed the right number, she  want [place] to contact her on Mom's number and will call her after ultrasound will be scheduled.

## 2015-10-08 NOTE — Telephone Encounter (Signed)
Can you please order ultra sound for this pt I advise her that I will call her after I schedule an appointment for her.

## 2015-10-08 NOTE — Telephone Encounter (Signed)
Pt has appointment tomorrow on 08/01 @ 4:30 pm pt's notified for her appointment.

## 2015-10-09 ENCOUNTER — Ambulatory Visit: Payer: Medicaid Other

## 2015-10-09 ENCOUNTER — Telehealth: Payer: Self-pay | Admitting: Family Medicine

## 2015-10-09 NOTE — Telephone Encounter (Signed)
Called pt and left message regarding her appointment and time as she wanted [place] to reschedule her appointment for tomorrow.

## 2015-10-10 ENCOUNTER — Ambulatory Visit
Admission: RE | Admit: 2015-10-10 | Discharge: 2015-10-10 | Disposition: A | Discharge status: Home / Self Care | Payer: Medicaid Other | Source: Ambulatory Visit | Attending: Family Medicine | Admitting: Family Medicine

## 2015-10-10 DIAGNOSIS — R791 Abnormal coagulation profile: Secondary | ICD-10-CM | POA: Insufficient documentation

## 2015-10-10 DIAGNOSIS — M79602 Pain in left arm: Secondary | ICD-10-CM | POA: Diagnosis not present

## 2015-10-10 DIAGNOSIS — M79605 Pain in left leg: Secondary | ICD-10-CM | POA: Insufficient documentation

## 2015-10-10 DIAGNOSIS — M7989 Other specified soft tissue disorders: Secondary | ICD-10-CM | POA: Diagnosis not present

## 2015-11-06 ENCOUNTER — Encounter: Payer: Self-pay | Admitting: Family Medicine

## 2015-11-06 ENCOUNTER — Telehealth: Payer: Self-pay | Admitting: *Deleted

## 2015-11-06 NOTE — Telephone Encounter (Signed)
[place] has tried to contact patient several times. We will try to contact patient to call and schedule appt.

## 2015-11-06 NOTE — Telephone Encounter (Signed)
Both patient numbers were invalid. We had a issue contacting her before and the numbers were corrected.  Called patient emergency contact to ask her to call the office.  I also sent patient a message in mychart.

## 2015-11-21 ENCOUNTER — Encounter: Payer: Self-pay | Admitting: Gastroenterology

## 2015-11-21 ENCOUNTER — Ambulatory Visit (INDEPENDENT_AMBULATORY_CARE_PROVIDER_SITE_OTHER): Payer: Self-pay | Admitting: Gastroenterology

## 2015-11-21 VITALS — BP 122/74 | HR 87 | Temp 98.2°F | Ht 67.0 in | Wt 170.0 lb

## 2015-11-21 DIAGNOSIS — R197 Diarrhea, unspecified: Secondary | ICD-10-CM

## 2015-11-21 DIAGNOSIS — R1084 Generalized abdominal pain: Secondary | ICD-10-CM

## 2015-11-21 MED ORDER — HYOSCYAMINE SULFATE 0.125 MG SL SUBL: 0.1250 mg | SUBLINGUAL_TABLET | SUBLINGUAL | 3 refills | Status: DC | PRN

## 2015-11-22 NOTE — Progress Notes (Signed)
Primary Care Physician: Leata Mouse, NP  Primary Gastroenterologist:  Dr. Lucilla Lame  Chief Complaint  Patient presents with  . Abdominal Pain  . Diarrhea    HPI: Nichole Wiley is a 35 y.o. female here for abdominal pain. Patient reports that she has attacks of this abdominal pain once every 3 months.  She reports the pain to be so severe that it doubles her over and she feels lightheaded with fainting and near fainting.  She also reports that when she gets this pain and the lightheadedness she also starts to have profuse sweating.  After that she has approximately 10 minutes of diarrhea.  She is then back to her normal self which is usually constipation and  This does not happen again for the next 3 months.  Current Outpatient Prescriptions  Medication Sig Dispense Refill  . bisacodyl (DULCOLAX) 5 MG EC tablet Take 1 tablet (5 mg total) by mouth at bedtime as needed for moderate constipation. For 1 week. 30 tablet 0  . insulin aspart (NOVOLOG) 100 UNIT/ML injection Inject into the skin.    . Insulin Glargine (LANTUS) 100 UNIT/ML Solostar Pen Inject 52 Units into the skin daily at 10 pm. 15 mL 11  . Insulin Pen Needle 31G X 5 MM MISC 1 each by Does not apply route daily. 100 each 5  . methadone (DOLOPHINE) 10 MG/ML solution Take 170 mg by mouth daily.    . furosemide (LASIX) 20 MG tablet Take 1 tablet (20 mg total) by mouth daily. (Patient not taking: Reported on 11/21/2015) 30 tablet 5  . hyoscyamine (LEVSIN SL) 0.125 MG SL tablet Place 1 tablet (0.125 mg total) under the tongue every 4 (four) hours as needed. 30 tablet 3  . ondansetron (ZOFRAN) 4 MG tablet Take 1 tablet (4 mg total) by mouth every 8 (eight) hours as needed for nausea or vomiting. (Patient not taking: Reported on 11/21/2015) 20 tablet 1   No current facility-administered medications for this visit.     Allergies as of 11/21/2015 - Review Complete 11/21/2015  Allergen Reaction Noted  . Cephalexin Other (See Comments)  08/29/2015  . Penicillins Rash 08/29/2015    ROS:  General: Negative for anorexia, weight loss, fever, chills, fatigue, weakness. ENT: Negative for hoarseness, difficulty swallowing , nasal congestion. CV: Negative for chest pain, angina, palpitations, dyspnea on exertion, peripheral edema.  Respiratory: Negative for dyspnea at rest, dyspnea on exertion, cough, sputum, wheezing.  GI: See history of present illness. GU:  Negative for dysuria, hematuria, urinary incontinence, urinary frequency, nocturnal urination.  Endo: Negative for unusual weight change.    Physical Examination:   BP 122/74   Pulse 87   Temp 98.2 F (36.8 C) (Oral)   Ht 5\' 7"  (1.702 m)   Wt 170 lb (77.1 kg)   BMI 26.63 kg/m   General: Well-nourished, well-developed in no acute distress.  Eyes: No icterus. Conjunctivae pink. Mouth: Oropharyngeal mucosa moist and pink , no lesions erythema or exudate. Lungs: Clear to auscultation bilaterally. Non-labored. Heart: Regular rate and rhythm, no murmurs rubs or gallops.  Abdomen: Bowel sounds are normal, nontender, nondistended, no hepatosplenomegaly or masses, no abdominal bruits or hernia , no rebound or guarding.   Extremities: No lower extremity edema. No clubbing or deformities. Neuro: Alert and oriented x 3.  Grossly intact. Skin: Warm and dry, no jaundice.   Psych: Alert and cooperative, normal mood and affect.  Labs:    Imaging Studies: No results found.  Assessment and  Plan:   Nichole Wiley is a 35 y.o. y/o female who comes in with episodic abdominal pain that appears to be spasmodic pain causing her to have vasovagal episodes with sweating and fainting. As a result the patient then  Has diarrhea for 10 minutes.  It hard to pinpoint an underlying GI pathology that would occur only once every 3 months..  The patient may be having abdominal wall pain or muscle spasms which lead her to her vasovagal episodes..  Otherwise intestinal spasms may also be  causing her problems that she has been given Levsin 0.125 mg to be taken sublingually when she starts to have the pain.  The patient has been explained the plan and agrees with it.   Note: This dictation was prepared with Dragon dictation along with smaller phrase technology. Any transcriptional errors that result from this process are unintentional.

## 2016-01-15 DIAGNOSIS — N19 Unspecified kidney failure: Secondary | ICD-10-CM | POA: Insufficient documentation

## 2016-01-15 DIAGNOSIS — M86172 Other acute osteomyelitis, left ankle and foot: Secondary | ICD-10-CM | POA: Insufficient documentation

## 2016-01-15 DIAGNOSIS — E871 Hypo-osmolality and hyponatremia: Secondary | ICD-10-CM | POA: Insufficient documentation

## 2016-01-15 DIAGNOSIS — L03032 Cellulitis of left toe: Secondary | ICD-10-CM | POA: Insufficient documentation

## 2016-01-15 DIAGNOSIS — Z87891 Personal history of nicotine dependence: Secondary | ICD-10-CM | POA: Insufficient documentation

## 2016-01-15 DIAGNOSIS — L600 Ingrowing nail: Secondary | ICD-10-CM | POA: Insufficient documentation

## 2016-06-02 ENCOUNTER — Telehealth: Payer: Self-pay

## 2016-06-02 NOTE — Telephone Encounter (Signed)
Pt called stating she went to the ER a couple of days ago. She was given Zofran. It is giving her a severe headache. She has an office appt with you next Wednesday for the abdominal pain/nausea. She would like something else for the nausea. Last ov with you on 6 months ago.

## 2016-06-03 ENCOUNTER — Other Ambulatory Visit: Payer: Self-pay

## 2016-06-03 MED ORDER — PROMETHAZINE HCL 25 MG PO TABS: 25.0000 mg | ORAL_TABLET | Freq: Four times a day (QID) | ORAL | 0 refills | Status: DC | PRN

## 2016-06-03 NOTE — Telephone Encounter (Signed)
Switch her to phenergan.

## 2016-06-03 NOTE — Telephone Encounter (Signed)
Pt notified Dr. Allen Norris changed to Phenergan. This was sent to pharmacy.

## 2016-06-11 ENCOUNTER — Encounter: Payer: Self-pay | Admitting: Gastroenterology

## 2016-06-11 ENCOUNTER — Ambulatory Visit: Payer: Medicaid Other | Admitting: Gastroenterology

## 2016-06-11 ENCOUNTER — Ambulatory Visit (INDEPENDENT_AMBULATORY_CARE_PROVIDER_SITE_OTHER): Payer: Medicaid Other | Admitting: Gastroenterology

## 2016-06-11 ENCOUNTER — Other Ambulatory Visit: Payer: Self-pay

## 2016-06-11 VITALS — BP 107/69 | HR 89 | Temp 98.1°F | Ht 67.0 in | Wt 179.2 lb

## 2016-06-11 DIAGNOSIS — K5900 Constipation, unspecified: Secondary | ICD-10-CM

## 2016-06-11 DIAGNOSIS — R110 Nausea: Secondary | ICD-10-CM | POA: Diagnosis not present

## 2016-06-11 DIAGNOSIS — F17200 Nicotine dependence, unspecified, uncomplicated: Secondary | ICD-10-CM | POA: Diagnosis not present

## 2016-06-11 DIAGNOSIS — R112 Nausea with vomiting, unspecified: Secondary | ICD-10-CM

## 2016-06-11 MED ORDER — POLYETHYLENE GLYCOL 3350 17 GM/SCOOP PO POWD: 1.0000 | Freq: Every day | ORAL | 3 refills | Status: DC

## 2016-06-11 MED ORDER — OMEPRAZOLE 40 MG PO CPDR: 40.0000 mg | DELAYED_RELEASE_CAPSULE | Freq: Every day | ORAL | 3 refills | Status: DC

## 2016-06-11 NOTE — Progress Notes (Signed)
Gastroenterology Consultation  Referring Provider:     ER  Primary Care Physician:  No PCP Per Patient Primary Gastroenterologist:  Dr. Jonathon Bellows  Reason for Consultation:     Abdominal pain         HPI:   Nichole Wiley is a 36 y.o. y/o female referred for consultation & management  .  She has been referred by Er for reflux. She was seen at the ER on 05/31/16 when she presented with abdominal pain of 2 years duration . She has a history of diabetes, abdominal adhesions. She is a current smoker. She was given IV fluids and discharged. No recent abdominal imaging . 8 months back her glucose was 364 . In the ER was 228.    Mainly issues are nausea. Says it has been ongoing for over 2 years, getting gradually worse.   All day long when she has an episode, it gets worse. Otherwise at baseline has it all the time. 2 weeks back she threw up . She takes advil occasionally , bc's very rare. Diabetes is not well controlled and her Hba1c was 11 recently . She is due to have her thyroid also checked.   She also suffers from heartburn occasionally . She does not smoke any marijuana nor is she exposed to the same. She smokes 1 pack a cigarettes a day , was 2 packs a day previously . She does have a lot of gas and bloating , worse when she has nausea, sh edoes have abdominal distension . No artificial sugars, no diet sodas, she chews occasionally some gum . Never had an endoscopy .  Last used pain pills which she was addicted 10 years back. Has a bowel movement once in 2 weeks When she belches - has a very foul odor.   Past Medical History:  Diagnosis Date  . Arthritis    back  . Colon polyp   . Diabetes 1.5, managed as type 1 ([place])   . Drug addiction ([place])    hx (pain pills after c-sect)  . GERD (gastroesophageal reflux disease)   . Pelvic floor dysfunction   . Urine incontinence     Past Surgical History:  Procedure Laterality Date  . ABDOMINAL HYSTERECTOMY  2005  . CESAREAN SECTION  2003   . COLONOSCOPY WITH PROPOFOL N/A 09/14/2015   Procedure: COLONOSCOPY WITH PROPOFOL;  Surgeon: Lucilla Lame, MD;  Location: [facility];  Service: Endoscopy;  Laterality: N/A;  Diabetic - insulin  . LAPAROSCOPY    . LYSIS OF ADHESION    . POLYPECTOMY  09/14/2015   Procedure: POLYPECTOMY;  Surgeon: Lucilla Lame, MD;  Location: [facility];  Service: Endoscopy;;  . RIGHT OOPHORECTOMY  2012  . TONSILLECTOMY  1987   and adenoids  . TUBAL LIGATION  2004    Prior to Admission medications   Medication Sig Start Date End Date Taking? Authorizing Provider  hyoscyamine (LEVSIN SL) 0.125 MG SL tablet Place 1 tablet (0.125 mg total) under the tongue every 4 (four) hours as needed. 11/21/15  Yes Darren Allen Norris, MD  insulin aspart (NOVOLOG) 100 UNIT/ML injection Inject into the skin.   Yes Historical Provider, MD  Insulin Glargine (LANTUS SOLOSTAR) 100 UNIT/ML Solostar Pen Inject into the skin.   Yes Historical Provider, MD  methadone (DOLOPHINE) 10 MG/ML solution Take 170 mg by mouth daily.   Yes Historical Provider, MD  bisacodyl (DULCOLAX) 5 MG EC tablet Take 1 tablet (5 mg total) by mouth at bedtime  as needed for moderate constipation. For 1 week. Patient not taking: Reported on 06/11/2016 10/01/15   Amy Overton Mam, NP  furosemide (LASIX) 20 MG tablet Take 1 tablet (20 mg total) by mouth daily. Patient not taking: Reported on 11/21/2015 09/28/15   Amy Overton Mam, NP  Insulin Glargine (LANTUS) 100 UNIT/ML Solostar Pen Inject 52 Units into the skin daily at 10 pm. Patient not taking: Reported on 06/11/2016 09/28/15   Amy Lauren Krebs, NP  Insulin Pen Needle 31G X 5 MM MISC 1 each by Does not apply route daily. Patient not taking: Reported on 06/11/2016 09/28/15   Amy Lauren Krebs, NP  ondansetron (ZOFRAN) 4 MG tablet Take 1 tablet (4 mg total) by mouth every 8 (eight) hours as needed for nausea or vomiting. Patient not taking: Reported on 11/21/2015 09/28/15   Amy Overton Mam, NP  promethazine  (PHENERGAN) 25 MG tablet Take 1 tablet (25 mg total) by mouth every 6 (six) hours as needed for nausea or vomiting. Patient not taking: Reported on 06/11/2016 06/03/16   Lucilla Lame, MD    Family History  Problem Relation Age of Onset  . Depression Mother   . Diabetes Father   . Alcohol abuse Father   . Heart disease Maternal Grandmother   . Depression Maternal Grandmother   . Dementia Maternal Grandmother      Social History  Substance Use Topics  . Smoking status: Current Every Day Smoker    Packs/day: 1.00    Years: 24.00    Types: Cigarettes  . Smokeless tobacco: Never Used     Comment: has cut down from 2 PPD  . Alcohol use No    Allergies as of 06/11/2016 - Review Complete 06/11/2016  Allergen Reaction Noted  . Cephalexin Other (See Comments) and Rash 08/29/2015  . Penicillins Rash 08/29/2015    Review of Systems:    All systems reviewed and negative except where noted in HPI.   Physical Exam:  BP 107/69 (BP Location: Left Arm, Patient Position: Sitting, Cuff Size: Normal)   Pulse 89   Temp 98.1 F (36.7 C) (Oral)   Ht 5\' 7"  (1.702 m)   Wt 179 lb 3.2 oz (81.3 kg)   BMI 28.07 kg/m  No LMP recorded. Patient has had a hysterectomy. Psych:  Alert and cooperative. Normal mood and affect. General:   Alert,  Well-developed, well-nourished, pleasant and cooperative in NAD Head:  Normocephalic and atraumatic. Eyes:  Sclera clear, no icterus.   Conjunctiva pink. Ears:  Normal auditory acuity. Nose:  No deformity, discharge, or lesions. Mouth:  No deformity or lesions,oropharynx pink & moist. Neck:  Supple; no masses or thyromegaly. Lungs:  Respirations even and unlabored.  Clear throughout to auscultation.   No wheezes, crackles, or rhonchi. No acute distress. Heart:  Regular rate and rhythm; no murmurs, clicks, rubs, or gallops. Abdomen:  Normal bowel sounds.  No bruits.  Soft, non-tender and non-distended without masses, hepatosplenomegaly or hernias noted.  No  guarding or rebound tenderness.    Msk:  Symmetrical without gross deformities. Good, equal movement & strength bilaterally. Pulses:  Normal pulses noted. Extremities:  No clubbing or edema.  No cyanosis. Neurologic:  Alert and oriented x3;  grossly normal neurologically. Skin:  Intact without significant lesions or rashes. No jaundice. Lymph Nodes:  No significant cervical adenopathy. Psych:  Alert and cooperative. Normal mood and affect.  Imaging Studies: No results found.  Assessment and Plan:   Nichole Wiley is a 36 y.o. y/o  female has been referred for chronic abdominal pain. Appears her abdominal pain is likely a combination of gastroparesis +/- constipation +/- bloating from aerophagia and likely SIBO   Plan  1. PPI  2. Better control of diabetes with aim to keep sugars < 150 to help with likely gastroparesis  3. 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 EGD 5. Stop smoking and no NSAID's 6. If no better will need to consider empirically treating for SIBO (small bowel bacterial overgrowth syndrome) as she has had abdominal adhesions and is at higher risk  7. Miralax for constipation  8. High fiber diet   Follow up in 8-10 weeks   Dr Jonathon Bellows MD

## 2016-06-18 ENCOUNTER — Encounter: Payer: Self-pay | Admitting: *Deleted

## 2016-06-19 ENCOUNTER — Ambulatory Visit
Admission: RE | Admit: 2016-06-19 | Discharge: 2016-06-19 | Disposition: A | Discharge status: Home / Self Care | Payer: Medicaid Other | Source: Ambulatory Visit | Attending: Gastroenterology | Admitting: Gastroenterology

## 2016-06-19 ENCOUNTER — Ambulatory Visit: Payer: Medicaid Other | Admitting: *Deleted

## 2016-06-19 ENCOUNTER — Encounter
Admission: RE | Disposition: A | Discharge status: Home / Self Care | Payer: Self-pay | Source: Ambulatory Visit | Attending: Gastroenterology

## 2016-06-19 DIAGNOSIS — Z5309 Procedure and treatment not carried out because of other contraindication: Secondary | ICD-10-CM | POA: Diagnosis not present

## 2016-06-19 DIAGNOSIS — Z79899 Other long term (current) drug therapy: Secondary | ICD-10-CM | POA: Diagnosis not present

## 2016-06-19 DIAGNOSIS — Z881 Allergy status to other antibiotic agents status: Secondary | ICD-10-CM | POA: Diagnosis not present

## 2016-06-19 DIAGNOSIS — Z811 Family history of alcohol abuse and dependence: Secondary | ICD-10-CM | POA: Insufficient documentation

## 2016-06-19 DIAGNOSIS — E119 Type 2 diabetes mellitus without complications: Secondary | ICD-10-CM | POA: Insufficient documentation

## 2016-06-19 DIAGNOSIS — M4690 Unspecified inflammatory spondylopathy, site unspecified: Secondary | ICD-10-CM | POA: Insufficient documentation

## 2016-06-19 DIAGNOSIS — T182XXA Foreign body in stomach, initial encounter: Secondary | ICD-10-CM | POA: Diagnosis not present

## 2016-06-19 DIAGNOSIS — X58XXXA Exposure to other specified factors, initial encounter: Secondary | ICD-10-CM | POA: Diagnosis not present

## 2016-06-19 DIAGNOSIS — R1013 Epigastric pain: Secondary | ICD-10-CM | POA: Diagnosis not present

## 2016-06-19 DIAGNOSIS — Z820 Family history of epilepsy and other diseases of the nervous system: Secondary | ICD-10-CM | POA: Insufficient documentation

## 2016-06-19 DIAGNOSIS — Z86010 Personal history of colonic polyps: Secondary | ICD-10-CM | POA: Diagnosis not present

## 2016-06-19 DIAGNOSIS — Z880 Allergy status to penicillin: Secondary | ICD-10-CM | POA: Insufficient documentation

## 2016-06-19 DIAGNOSIS — Z818 Family history of other mental and behavioral disorders: Secondary | ICD-10-CM | POA: Diagnosis not present

## 2016-06-19 DIAGNOSIS — F17210 Nicotine dependence, cigarettes, uncomplicated: Secondary | ICD-10-CM | POA: Insufficient documentation

## 2016-06-19 DIAGNOSIS — R32 Unspecified urinary incontinence: Secondary | ICD-10-CM | POA: Diagnosis not present

## 2016-06-19 DIAGNOSIS — Z794 Long term (current) use of insulin: Secondary | ICD-10-CM | POA: Diagnosis not present

## 2016-06-19 DIAGNOSIS — R112 Nausea with vomiting, unspecified: Secondary | ICD-10-CM

## 2016-06-19 DIAGNOSIS — Z833 Family history of diabetes mellitus: Secondary | ICD-10-CM | POA: Insufficient documentation

## 2016-06-19 DIAGNOSIS — K219 Gastro-esophageal reflux disease without esophagitis: Secondary | ICD-10-CM | POA: Diagnosis not present

## 2016-06-19 DIAGNOSIS — Z90710 Acquired absence of both cervix and uterus: Secondary | ICD-10-CM | POA: Diagnosis not present

## 2016-06-19 HISTORY — PX: ESOPHAGOGASTRODUODENOSCOPY (EGD) WITH PROPOFOL: SHX5813

## 2016-06-19 LAB — GLUCOSE, CAPILLARY: Glucose-Capillary: 311 mg/dL — ABNORMAL HIGH (ref 65–99)

## 2016-06-19 SURGERY — ESOPHAGOGASTRODUODENOSCOPY (EGD) WITH PROPOFOL
Anesthesia: General

## 2016-06-19 MED ORDER — SODIUM CHLORIDE 0.9 % IV SOLN
INTRAVENOUS | Status: DC
  Administered 2016-06-19: 10:00:00 via INTRAVENOUS

## 2016-06-19 MED ORDER — PROPOFOL 500 MG/50ML IV EMUL
INTRAVENOUS | Status: AC
  Filled 2016-06-19: qty 50

## 2016-06-19 NOTE — Anesthesia Postprocedure Evaluation (Signed)
Anesthesia Post Note  Patient: Nichole Wiley  Procedure(s) Performed: Procedure(s) (LRB): ESOPHAGOGASTRODUODENOSCOPY (EGD) WITH PROPOFOL (N/A)  Patient location during evaluation: Endoscopy Anesthesia Type: General Level of consciousness: awake and alert Pain management: pain level controlled Vital Signs Assessment: post-procedure vital signs reviewed and stable Respiratory status: spontaneous breathing, nonlabored ventilation, respiratory function stable and patient connected to nasal cannula oxygen Cardiovascular status: blood pressure returned to baseline and stable Postop Assessment: no signs of nausea or vomiting Anesthetic complications: no     Last Vitals:  Vitals:   06/19/16 1037 06/19/16 1047  BP: 127/82 119/73  Pulse: 77 77  Resp: 12 15  Temp:      Last Pain:  Vitals:   06/19/16 1017  TempSrc: Tympanic                 Martha Clan

## 2016-06-19 NOTE — Transfer of Care (Signed)
Immediate Anesthesia Transfer of Care Note  Patient: Nichole Wiley  Procedure(s) Performed: Procedure(s): ESOPHAGOGASTRODUODENOSCOPY (EGD) WITH PROPOFOL (N/A)  Patient Location: PACU  Anesthesia Type:General  Level of Consciousness: awake, alert  and oriented  Airway & Oxygen Therapy: Patient Spontanous Breathing and Patient connected to nasal cannula oxygen  Post-op Assessment: Report given to RN and Post -op Vital signs reviewed and stable  Post vital signs: Reviewed and stable  Last Vitals:  Vitals:   06/19/16 0935 06/19/16 1017  BP: 131/72 121/73  Pulse: 86 77  Resp: 17 16  Temp: (!) 35.9 C (!) 35.9 C    Last Pain:  Vitals:   06/19/16 1017  TempSrc: Tympanic         Complications: No apparent anesthesia complications

## 2016-06-19 NOTE — Op Note (Signed)
[facility] Gastroenterology Patient Name: Nichole Wiley Procedure Date: 06/19/2016 9:59 AM MRN: [id number] Account #: [id number] Date of Birth: [birth year] Admit Type: Outpatient Age: 36 Room: [facility] ENDO ROOM 4 Gender: Female Note Status: Finalized Procedure:            Upper GI endoscopy Indications:          Dyspepsia Providers:            Jonathon Bellows MD, MD Referring MD:         Casilda Carls, MD (Referring MD) Medicines:            Monitored Anesthesia Care Complications:        No immediate complications. Procedure:            Pre-Anesthesia Assessment:                       - Prior to the procedure, a History and Physical was                        performed, and patient medications, allergies and                        sensitivities were reviewed. The patient's tolerance of                        previous anesthesia was reviewed.                       - ASA Grade Assessment: III - A patient with severe                        systemic disease.                       After obtaining informed consent, the endoscope was                        passed under direct vision. Throughout the procedure,                        the patient's blood pressure, pulse, and oxygen                        saturations were monitored continuously. The Endoscope                        was introduced through the mouth, with the intention of                        advancing to the duodenum. The scope was advanced to                        the third part of the duodenum before the procedure was                        aborted. Medications were given. The upper GI endoscopy                        was accomplished with ease. The procedure was aborted  due to presence of food. Findings:      The esophagus was normal.      A large amount of food (residue) was found in the gastric fundus. Impression:           - The procedure was aborted due to presence of food.                 - Normal esophagus.                       - A large amount of food (residue) in the stomach.                       - No specimens collected. Recommendation:       - Discharge patient to home (with escort).                       - Resume previous diet.                       - Continue present medications.                       - Repeat upper endoscopy in 2 weeks because the                        preparation was poor.                       - Suggest 24 hours of clears prior to procedure and 2                        days of low fiber diet prior to clears to ensure                        stomach is clear of food. Procedure Code(s):    --- Professional ---                       [phone], 52, Esophagogastroduodenoscopy, flexible,                        transoral; diagnostic, including collection of                        specimen(s) by brushing or washing, when performed                        (separate procedure) Diagnosis Code(s):    --- Professional ---                       Z53.8, Procedure and treatment not carried out for                        other reasons                       R10.13, Epigastric pain CPT copyright 2016 American Medical Association. All rights reserved. The codes documented in this report are preliminary and upon coder review may  be revised to meet current compliance requirements. Jonathon Bellows, MD Jonathon Bellows MD, MD 06/19/2016 10:10:39 AM This report has been signed electronically. Number of  Addenda: 0 Note Initiated On: 06/19/2016 9:59 AM      [facility]

## 2016-06-19 NOTE — H&P (Signed)
Jonathon Bellows MD [address] [place], [place] [address] Phone: [phone] Fax : [phone]  Primary Care Physician:  Casilda Carls Primary Gastroenterologist:  Dr. Jonathon Bellows   Pre-Procedure History & Physical: HPI:  Nichole Wiley is a 36 y.o. female is here for an endoscopy.   Past Medical History:  Diagnosis Date  . Arthritis    back  . Colon polyp   . Diabetes 1.5, managed as type 1 ([place])   . Drug addiction ([employer])    hx (pain pills after c-sect)  . GERD (gastroesophageal reflux disease)   . Pelvic floor dysfunction   . Urine incontinence     Past Surgical History:  Procedure Laterality Date  . ABDOMINAL HYSTERECTOMY  2005  . CESAREAN SECTION  2003  . COLONOSCOPY WITH PROPOFOL N/A 09/14/2015   Procedure: COLONOSCOPY WITH PROPOFOL;  Surgeon: Lucilla Lame, MD;  Location: [place];  Service: Endoscopy;  Laterality: N/A;  Diabetic - insulin  . LAPAROSCOPY    . LYSIS OF ADHESION    . POLYPECTOMY  09/14/2015   Procedure: POLYPECTOMY;  Surgeon: Lucilla Lame, MD;  Location: [place];  Service: Endoscopy;;  . RIGHT OOPHORECTOMY  2012  . TONSILLECTOMY  1987   and adenoids  . TUBAL LIGATION  2004    Prior to Admission medications   Medication Sig Start Date End Date Taking? Authorizing Provider  hyoscyamine (LEVSIN SL) 0.125 MG SL tablet Place 1 tablet (0.125 mg total) under the tongue every 4 (four) hours as needed. 11/21/15  Yes Darren Allen Norris, MD  insulin aspart (NOVOLOG) 100 UNIT/ML injection Inject into the skin.   Yes Historical Provider, MD  methadone (DOLOPHINE) 10 MG/ML solution Take 170 mg by mouth daily.   Yes Historical Provider, MD  polyethylene glycol powder (GLYCOLAX/MIRALAX) powder Take 255 g by mouth daily. 06/11/16  Yes Jonathon Bellows, MD  promethazine (PHENERGAN) 25 MG tablet Take 1 tablet (25 mg total) by mouth every 6 (six) hours as needed for nausea or vomiting. 06/03/16  Yes Lucilla Lame, MD  bisacodyl (DULCOLAX) 5 MG EC tablet Take 1  tablet (5 mg total) by mouth at bedtime as needed for moderate constipation. For 1 week. Patient not taking: Reported on 06/11/2016 10/01/15   Amy Overton Mam, NP  furosemide (LASIX) 20 MG tablet Take 1 tablet (20 mg total) by mouth daily. Patient not taking: Reported on 11/21/2015 09/28/15   Amy Overton Mam, NP  Insulin Glargine (LANTUS SOLOSTAR) 100 UNIT/ML Solostar Pen Inject into the skin.    Historical Provider, MD  Insulin Glargine (LANTUS) 100 UNIT/ML Solostar Pen Inject 52 Units into the skin daily at 10 pm. Patient not taking: Reported on 06/11/2016 09/28/15   Amy Lauren Krebs, NP  Insulin Pen Needle 31G X 5 MM MISC 1 each by Does not apply route daily. Patient not taking: Reported on 06/11/2016 09/28/15   Amy Overton Mam, NP  omeprazole (PRILOSEC) 40 MG capsule Take 1 capsule (40 mg total) by mouth daily. 06/11/16   Jonathon Bellows, MD  ondansetron (ZOFRAN) 4 MG tablet Take 1 tablet (4 mg total) by mouth every 8 (eight) hours as needed for nausea or vomiting. Patient not taking: Reported on 11/21/2015 09/28/15   Amy Overton Mam, NP    Allergies as of 06/11/2016 - Review Complete 06/11/2016  Allergen Reaction Noted  . Cephalexin Other (See Comments) and Rash 08/29/2015  . Penicillins Rash 08/29/2015    Family History  Problem Relation Age of Onset  . Depression Mother   .  Diabetes Father   . Alcohol abuse Father   . Heart disease Maternal Grandmother   . Depression Maternal Grandmother   . Dementia Maternal Grandmother     Social History   Social History  . Marital status: Married    Spouse name: N/A  . Number of children: N/A  . Years of education: N/A   Occupational History  . Not on file.   Social History Main Topics  . Smoking status: Current Every Day Smoker    Packs/day: 1.00    Years: 24.00    Types: Cigarettes  . Smokeless tobacco: Never Used     Comment: has cut down from 2 PPD  . Alcohol use No  . Drug use: No  . Sexual activity: Not on file   Other Topics  Concern  . Not on file   Social History Narrative  . No narrative on file    Review of Systems: See HPI, otherwise negative ROS  Physical Exam: BP 131/72   Pulse 86   Temp (!) 96.7 F (35.9 C) (Oral)   Resp 17   Ht 5\' 7"  (1.702 m)   Wt 175 lb (79.4 kg)   SpO2 100%   BMI 27.41 kg/m  General:   Alert,  pleasant and cooperative in NAD Head:  Normocephalic and atraumatic. Neck:  Supple; no masses or thyromegaly. Lungs:  Clear throughout to auscultation.    Heart:  Regular rate and rhythm. Abdomen:  Soft, nontender and nondistended. Normal bowel sounds, without guarding, and without rebound.   Neurologic:  Alert and  oriented x4;  grossly normal neurologically.  Impression/Plan: [name] is here for an endoscopy to be performed for abdominal pain   Risks, benefits, limitations, and alternatives regarding  endoscopy have been reviewed with the patient.  Questions have been answered.  All parties agreeable.   Jonathon Bellows, MD  06/19/2016, 9:52 AM

## 2016-06-19 NOTE — Anesthesia Post-op Follow-up Note (Cosign Needed)
Anesthesia QCDR form completed.        

## 2016-06-19 NOTE — Anesthesia Preprocedure Evaluation (Signed)
Anesthesia Evaluation  Patient identified by MRN, date of birth, ID band Patient awake    Reviewed: Allergy & Precautions, H&P , NPO status , Patient's Chart, lab work & pertinent test results, reviewed documented beta blocker date and time   History of Anesthesia Complications Negative for: history of anesthetic complications  Airway Mallampati: I  TM Distance: >3 FB Neck ROM: full    Dental  (+) Missing, Poor Dentition   Pulmonary neg pulmonary ROS, Current Smoker,           Cardiovascular Exercise Tolerance: Good negative cardio ROS       Neuro/Psych negative neurological ROS  negative psych ROS   GI/Hepatic Neg liver ROS, GERD  ,  Endo/Other  diabetes  Renal/GU CRFRenal disease  negative genitourinary   Musculoskeletal   Abdominal   Peds  Hematology negative hematology ROS (+)   Anesthesia Other Findings Past Medical History: No date: Arthritis     Comment: back No date: Colon polyp No date: Diabetes 1.5, managed as type 1 ([facility]) No date: Drug addiction ([place])     Comment: hx (pain pills after c-sect) No date: GERD (gastroesophageal reflux disease) No date: Pelvic floor dysfunction No date: Urine incontinence   Reproductive/Obstetrics negative OB ROS                             Anesthesia Physical Anesthesia Plan  ASA: II  Anesthesia Plan: General   Post-op Pain Management:    Induction:   Airway Management Planned:   Additional Equipment:   Intra-op Plan:   Post-operative Plan:   Informed Consent: I have reviewed the patients History and Physical, chart, labs and discussed the procedure including the risks, benefits and alternatives for the proposed anesthesia with the patient or authorized representative who has indicated his/her understanding and acceptance.   Dental Advisory Given  Plan Discussed with: Anesthesiologist, CRNA and Surgeon  Anesthesia Plan  Comments:         Anesthesia Quick Evaluation

## 2016-06-20 ENCOUNTER — Encounter: Payer: Self-pay | Admitting: Gastroenterology

## 2016-06-26 ENCOUNTER — Telehealth: Payer: Self-pay | Admitting: Gastroenterology

## 2016-06-26 NOTE — Telephone Encounter (Signed)
06/26/16 NO prior auth required for Gastric Emptying 78264 R11.0 Nausea

## 2016-06-27 ENCOUNTER — Encounter: Payer: Medicaid Other | Attending: Gastroenterology

## 2016-08-06 ENCOUNTER — Ambulatory Visit: Payer: Medicaid Other | Admitting: Gastroenterology

## 2016-09-11 ENCOUNTER — Ambulatory Visit: Payer: Medicaid Other | Admitting: Gastroenterology

## 2016-11-05 ENCOUNTER — Encounter: Payer: Self-pay | Admitting: Gastroenterology

## 2016-11-05 ENCOUNTER — Ambulatory Visit: Payer: Medicaid Other | Admitting: Gastroenterology

## 2016-11-05 NOTE — Progress Notes (Deleted)
Summary of history : She was initially seen at my office back in 06/2016 for abdominal pain , reflux. H/o abdominal adhesions, diabetes, smoker . Nausea ongoing for 2 years duration. Hba1c 11 . H/o constipation and having 1 bowel movement once in two weeks.    Interval history   4//2018-  11/05/2016   EGD 06/2016 - large qty of food in the stomach and hence could not visualize . Did not obtain gastric emptying study   Nichole Wiley is a 36 y.o. y/o female has been referred for chronic abdominal pain. Appears her abdominal pain is likely a combination of gastroparesis +/- constipation +/- bloating from aerophagia and likely SIBO   Plan  1. PPI  2. Better control of diabetes with aim to keep sugars < 150 to help with likely gastroparesis  3. 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 EGD to be repeated with 24 hours of clears  5. Stop smoking and no NSAID's 6. If no better will need to consider empirically treating for SIBO (small bowel bacterial overgrowth syndrome) as she has had abdominal adhesions and is at higher risk  7. Miralax for constipation  8. Low fiber diet due to likely gastroparesis

## 2017-06-06 ENCOUNTER — Emergency Department: Payer: Medicaid Other

## 2017-06-06 ENCOUNTER — Other Ambulatory Visit: Payer: Self-pay

## 2017-06-06 ENCOUNTER — Inpatient Hospital Stay: Payer: Medicaid Other | Admitting: Registered Nurse

## 2017-06-06 ENCOUNTER — Encounter: Payer: Self-pay | Admitting: Emergency Medicine

## 2017-06-06 ENCOUNTER — Inpatient Hospital Stay
Admission: EM | Admit: 2017-06-06 | Discharge: 2017-06-07 | DRG: 661 | Disposition: A | Discharge status: Home / Self Care | Payer: Medicaid Other | Attending: Obstetrics and Gynecology | Admitting: Obstetrics and Gynecology

## 2017-06-06 ENCOUNTER — Encounter
Admission: EM | Disposition: A | Discharge status: Home / Self Care | Payer: Self-pay | Source: Home / Self Care | Attending: Obstetrics and Gynecology

## 2017-06-06 DIAGNOSIS — R109 Unspecified abdominal pain: Secondary | ICD-10-CM

## 2017-06-06 DIAGNOSIS — N736 Female pelvic peritoneal adhesions (postinfective): Secondary | ICD-10-CM

## 2017-06-06 DIAGNOSIS — N1330 Unspecified hydronephrosis: Secondary | ICD-10-CM | POA: Diagnosis present

## 2017-06-06 DIAGNOSIS — Z794 Long term (current) use of insulin: Secondary | ICD-10-CM | POA: Diagnosis not present

## 2017-06-06 DIAGNOSIS — N179 Acute kidney failure, unspecified: Secondary | ICD-10-CM

## 2017-06-06 DIAGNOSIS — R944 Abnormal results of kidney function studies: Secondary | ICD-10-CM

## 2017-06-06 DIAGNOSIS — R52 Pain, unspecified: Secondary | ICD-10-CM

## 2017-06-06 DIAGNOSIS — Z880 Allergy status to penicillin: Secondary | ICD-10-CM | POA: Diagnosis not present

## 2017-06-06 DIAGNOSIS — N132 Hydronephrosis with renal and ureteral calculous obstruction: Secondary | ICD-10-CM

## 2017-06-06 DIAGNOSIS — R102 Pelvic and perineal pain: Secondary | ICD-10-CM | POA: Diagnosis present

## 2017-06-06 DIAGNOSIS — F17210 Nicotine dependence, cigarettes, uncomplicated: Secondary | ICD-10-CM | POA: Diagnosis present

## 2017-06-06 DIAGNOSIS — E109 Type 1 diabetes mellitus without complications: Secondary | ICD-10-CM | POA: Diagnosis present

## 2017-06-06 DIAGNOSIS — N289 Disorder of kidney and ureter, unspecified: Secondary | ICD-10-CM | POA: Diagnosis present

## 2017-06-06 DIAGNOSIS — R1030 Lower abdominal pain, unspecified: Secondary | ICD-10-CM

## 2017-06-06 HISTORY — PX: CYSTOSCOPY W/ URETERAL STENT PLACEMENT: SHX1429

## 2017-06-06 LAB — GLUCOSE, CAPILLARY
GLUCOSE-CAPILLARY: 306 mg/dL — AB (ref 65–99)
GLUCOSE-CAPILLARY: 319 mg/dL — AB (ref 65–99)
GLUCOSE-CAPILLARY: 265 mg/dL — AB (ref 65–99)
Glucose-Capillary: 218 mg/dL — ABNORMAL HIGH (ref 65–99)

## 2017-06-06 LAB — URINALYSIS, COMPLETE (UACMP) WITH MICROSCOPIC
Bilirubin Urine: NEGATIVE
Glucose, UA: >=500 mg/dL — AB
Ketones, ur: NEGATIVE mg/dL
Leukocytes, UA: NEGATIVE
Nitrite: NEGATIVE
Protein, ur: 100 mg/dL — AB
SPECIFIC GRAVITY, URINE: 1.008 (ref 1.005–1.030)
pH: 6 (ref 5.0–8.0)

## 2017-06-06 LAB — COMPREHENSIVE METABOLIC PANEL
ALK PHOS: 91 U/L (ref 38–126)
ALT: 11 U/L — AB (ref 14–54)
AST: 13 U/L — AB (ref 15–41)
Albumin: 3.6 g/dL (ref 3.5–5.0)
Anion gap: 8 (ref 5–15)
BILIRUBIN TOTAL: 0.5 mg/dL (ref 0.3–1.2)
BUN: 22 mg/dL — AB (ref 6–20)
CALCIUM: 9.2 mg/dL (ref 8.9–10.3)
CO2: 25 mmol/L (ref 22–32)
CREATININE: 1.56 mg/dL — AB (ref 0.44–1.00)
Chloride: 102 mmol/L (ref 101–111)
GFR, EST AFRICAN AMERICAN: 48 mL/min — AB (ref >60)
GFR, EST NON AFRICAN AMERICAN: 41 mL/min — AB (ref >60)
Glucose, Bld: 330 mg/dL — ABNORMAL HIGH (ref 65–99)
Potassium: 3.9 mmol/L (ref 3.5–5.1)
Sodium: 135 mmol/L (ref 135–145)
Total Protein: 6.9 g/dL (ref 6.5–8.1)

## 2017-06-06 LAB — CBC
HEMATOCRIT: 34.3 % — AB (ref 35.0–47.0)
HEMOGLOBIN: 11.7 g/dL — AB (ref 12.0–16.0)
MCH: 29.6 pg (ref 26.0–34.0)
MCHC: 34 g/dL (ref 32.0–36.0)
MCV: 86.9 fL (ref 80.0–100.0)
Platelets: 175 10*3/uL (ref 150–440)
RBC: 3.95 MIL/uL (ref 3.80–5.20)
RDW: 14.1 % (ref 11.5–14.5)
WBC: 10.3 10*3/uL (ref 3.6–11.0)

## 2017-06-06 LAB — LIPASE, BLOOD: LIPASE: 22 U/L (ref 11–51)

## 2017-06-06 SURGERY — CYSTOSCOPY, WITH RETROGRADE PYELOGRAM AND URETERAL STENT INSERTION
Anesthesia: General | Laterality: Right | Wound class: "Clean Contaminated "

## 2017-06-06 MED ORDER — INSULIN GLARGINE 100 UNIT/ML ~~LOC~~ SOLN
30.0000 [IU] | Freq: Every day | SUBCUTANEOUS | Status: DC
  Administered 2017-06-06: 30 [IU] via SUBCUTANEOUS
  Filled 2017-06-06 (×2): qty 0.3

## 2017-06-06 MED ORDER — METHADONE HCL 10 MG/ML PO CONC
170.0000 mg | Freq: Every day | ORAL | Status: DC
  Administered 2017-06-06 – 2017-06-07 (×2): 170 mg via ORAL
  Filled 2017-06-06 (×2): qty 17

## 2017-06-06 MED ORDER — CIPROFLOXACIN IN D5W 400 MG/200ML IV SOLN
INTRAVENOUS | Status: AC
  Filled 2017-06-06: qty 200

## 2017-06-06 MED ORDER — ONDANSETRON HCL 4 MG/2ML IJ SOLN
4.0000 mg | Freq: Once | INTRAMUSCULAR | Status: AC
  Administered 2017-06-06: 4 mg via INTRAVENOUS
  Filled 2017-06-06: qty 2

## 2017-06-06 MED ORDER — INSULIN ASPART 100 UNIT/ML ~~LOC~~ SOLN: 0.0000 [IU] | Freq: Every day | SUBCUTANEOUS | Status: DC

## 2017-06-06 MED ORDER — SODIUM CHLORIDE 0.9 % IV BOLUS
1000.0000 mL | Freq: Once | INTRAVENOUS | Status: AC
  Administered 2017-06-06: 1000 mL via INTRAVENOUS

## 2017-06-06 MED ORDER — ONDANSETRON HCL 4 MG/2ML IJ SOLN
INTRAMUSCULAR | Status: AC
  Filled 2017-06-06: qty 2

## 2017-06-06 MED ORDER — HYDROMORPHONE HCL 1 MG/ML IJ SOLN
0.5000 mg | INTRAMUSCULAR | Status: AC
  Administered 2017-06-06: 0.5 mg via INTRAVENOUS
  Filled 2017-06-06: qty 1

## 2017-06-06 MED ORDER — INSULIN ASPART 100 UNIT/ML ~~LOC~~ SOLN
3.0000 [IU] | Freq: Once | SUBCUTANEOUS | Status: AC
  Administered 2017-06-06: 3 [IU] via SUBCUTANEOUS

## 2017-06-06 MED ORDER — MORPHINE SULFATE (PF) 4 MG/ML IV SOLN: 4.0000 mg | Freq: Once | INTRAVENOUS | Status: DC

## 2017-06-06 MED ORDER — FENTANYL CITRATE (PF) 100 MCG/2ML IJ SOLN: 25.0000 ug | INTRAMUSCULAR | Status: DC | PRN

## 2017-06-06 MED ORDER — GLYCOPYRROLATE 0.2 MG/ML IJ SOLN
INTRAMUSCULAR | Status: DC | PRN
  Administered 2017-06-06: 0.2 mg via INTRAVENOUS

## 2017-06-06 MED ORDER — LACTATED RINGERS IV SOLN
INTRAVENOUS | Status: DC
  Administered 2017-06-06 – 2017-06-07 (×3): via INTRAVENOUS

## 2017-06-06 MED ORDER — ONDANSETRON HCL 4 MG/2ML IJ SOLN: 4.0000 mg | Freq: Once | INTRAMUSCULAR | Status: DC | PRN

## 2017-06-06 MED ORDER — INSULIN ASPART 100 UNIT/ML ~~LOC~~ SOLN
0.0000 [IU] | SUBCUTANEOUS | Status: DC
  Administered 2017-06-06: 7 [IU] via SUBCUTANEOUS
  Administered 2017-06-07: 3 [IU] via SUBCUTANEOUS
  Filled 2017-06-06 (×3): qty 1

## 2017-06-06 MED ORDER — PROPOFOL 10 MG/ML IV BOLUS
INTRAVENOUS | Status: DC | PRN
  Administered 2017-06-06: 150 mg via INTRAVENOUS

## 2017-06-06 MED ORDER — INSULIN ASPART 100 UNIT/ML ~~LOC~~ SOLN
0.0000 [IU] | SUBCUTANEOUS | Status: DC
  Filled 2017-06-06: qty 1

## 2017-06-06 MED ORDER — GLYCOPYRROLATE 0.2 MG/ML IJ SOLN
INTRAMUSCULAR | Status: AC
  Filled 2017-06-06: qty 1

## 2017-06-06 MED ORDER — PANTOPRAZOLE SODIUM 40 MG PO TBEC
40.0000 mg | DELAYED_RELEASE_TABLET | Freq: Every day | ORAL | Status: DC
  Administered 2017-06-07: 40 mg via ORAL
  Filled 2017-06-06: qty 1

## 2017-06-06 MED ORDER — MORPHINE SULFATE (PF) 4 MG/ML IV SOLN
4.0000 mg | Freq: Once | INTRAVENOUS | Status: AC
  Administered 2017-06-06: 4 mg via INTRAVENOUS
  Filled 2017-06-06: qty 1

## 2017-06-06 MED ORDER — DEXAMETHASONE SODIUM PHOSPHATE 10 MG/ML IJ SOLN
INTRAMUSCULAR | Status: AC
  Filled 2017-06-06: qty 1

## 2017-06-06 MED ORDER — FENTANYL CITRATE (PF) 100 MCG/2ML IJ SOLN
INTRAMUSCULAR | Status: DC | PRN
  Administered 2017-06-06: 50 ug via INTRAVENOUS

## 2017-06-06 MED ORDER — POLYETHYLENE GLYCOL 3350 17 G PO PACK
17.0000 g | PACK | Freq: Every day | ORAL | Status: DC | PRN
  Filled 2017-06-06: qty 1

## 2017-06-06 MED ORDER — MIDAZOLAM HCL 2 MG/2ML IJ SOLN
INTRAMUSCULAR | Status: DC | PRN
  Administered 2017-06-06: 2 mg via INTRAVENOUS

## 2017-06-06 MED ORDER — LIDOCAINE HCL (PF) 2 % IJ SOLN
INTRAMUSCULAR | Status: AC
  Filled 2017-06-06: qty 10

## 2017-06-06 MED ORDER — LIDOCAINE HCL (CARDIAC) 20 MG/ML IV SOLN
INTRAVENOUS | Status: DC | PRN
  Administered 2017-06-06: 80 mg via INTRAVENOUS

## 2017-06-06 MED ORDER — DEXAMETHASONE SODIUM PHOSPHATE 10 MG/ML IJ SOLN
INTRAMUSCULAR | Status: DC | PRN
  Administered 2017-06-06: 10 mg via INTRAVENOUS

## 2017-06-06 MED ORDER — FENTANYL CITRATE (PF) 100 MCG/2ML IJ SOLN
INTRAMUSCULAR | Status: AC
  Filled 2017-06-06: qty 2

## 2017-06-06 MED ORDER — HYDROMORPHONE HCL 1 MG/ML IJ SOLN
1.0000 mg | INTRAMUSCULAR | Status: AC
  Administered 2017-06-06: 1 mg via INTRAVENOUS
  Filled 2017-06-06: qty 1

## 2017-06-06 MED ORDER — INSULIN ASPART 100 UNIT/ML ~~LOC~~ SOLN
0.0000 [IU] | Freq: Three times a day (TID) | SUBCUTANEOUS | Status: DC
Start: 2017-06-06 — End: 2017-06-06

## 2017-06-06 MED ORDER — IOPAMIDOL (ISOVUE-300) INJECTION 61%
75.0000 mL | Freq: Once | INTRAVENOUS | Status: AC | PRN
  Administered 2017-06-06: 75 mL via INTRAVENOUS

## 2017-06-06 MED ORDER — CIPROFLOXACIN IN D5W 400 MG/200ML IV SOLN
INTRAVENOUS | Status: DC | PRN
  Administered 2017-06-06: 400 mg via INTRAVENOUS

## 2017-06-06 MED ORDER — MIDAZOLAM HCL 2 MG/2ML IJ SOLN
INTRAMUSCULAR | Status: AC
  Filled 2017-06-06: qty 2

## 2017-06-06 MED ORDER — ONDANSETRON HCL 4 MG/2ML IJ SOLN: 4.0000 mg | Freq: Four times a day (QID) | INTRAMUSCULAR | Status: DC | PRN

## 2017-06-06 MED ORDER — PROPOFOL 10 MG/ML IV BOLUS
INTRAVENOUS | Status: AC
  Filled 2017-06-06: qty 20

## 2017-06-06 MED ORDER — DOCUSATE SODIUM 100 MG PO CAPS
100.0000 mg | ORAL_CAPSULE | Freq: Two times a day (BID) | ORAL | Status: DC
  Administered 2017-06-07: 100 mg via ORAL
  Filled 2017-06-06: qty 1

## 2017-06-06 MED ORDER — ONDANSETRON HCL 4 MG PO TABS: 4.0000 mg | ORAL_TABLET | Freq: Four times a day (QID) | ORAL | Status: DC | PRN

## 2017-06-06 MED ORDER — IOTHALAMATE MEGLUMINE 17.2 % UR SOLN: URETHRAL | Status: DC | PRN

## 2017-06-06 MED ORDER — ONDANSETRON HCL 4 MG/2ML IJ SOLN
INTRAMUSCULAR | Status: DC | PRN
  Administered 2017-06-06: 4 mg via INTRAVENOUS

## 2017-06-06 MED ORDER — BISACODYL 10 MG RE SUPP: 10.0000 mg | Freq: Every day | RECTAL | Status: DC | PRN

## 2017-06-06 SURGICAL SUPPLY — 19 items
BAG DRAIN CYSTO-URO LG1000N (MISCELLANEOUS) ×4 IMPLANT
BRUSH SCRUB EZ  4% CHG (MISCELLANEOUS) ×1
BRUSH SCRUB EZ 4% CHG (MISCELLANEOUS) ×1 IMPLANT
CATH URETL 5X70 OPEN END (CATHETERS) ×2 IMPLANT
CONRAY 43 FOR UROLOGY 50M (MISCELLANEOUS) ×2 IMPLANT
DRAPE UTILITY 15X26 TOWEL STRL (DRAPES) ×2 IMPLANT
GOWN STRL REUS W/ TWL LRG LVL3 (GOWN DISPOSABLE) ×1 IMPLANT
GOWN STRL REUS W/TWL LRG LVL3 (GOWN DISPOSABLE) ×1
KIT TURNOVER CYSTO (KITS) ×2 IMPLANT
PACK CYSTO AR (MISCELLANEOUS) ×2 IMPLANT
SENSORWIRE 0.038 NOT ANGLED (WIRE) ×2
SET CYSTO W/LG BORE CLAMP LF (SET/KITS/TRAYS/PACK) ×3 IMPLANT
SOL .9 NS 3000ML IRR  AL (IV SOLUTION) ×1
SOL .9 NS 3000ML IRR UROMATIC (IV SOLUTION) ×1 IMPLANT
STENT URET 6FRX24 CONTOUR (STENTS) ×1 IMPLANT
STENT URET 6FRX26 CONTOUR (STENTS) ×1 IMPLANT
SURGILUBE 2OZ TUBE FLIPTOP (MISCELLANEOUS) ×2 IMPLANT
WATER STERILE IRR 1000ML POUR (IV SOLUTION) ×2 IMPLANT
WIRE SENSOR 0.038 NOT ANGLED (WIRE) ×1 IMPLANT

## 2017-06-06 NOTE — ED Notes (Signed)
Transported to CT 

## 2017-06-06 NOTE — Anesthesia Preprocedure Evaluation (Signed)
Anesthesia Evaluation  Patient identified by MRN, date of birth, ID band Patient awake    Reviewed: Allergy & Precautions, NPO status , Patient's Chart, lab work & pertinent test results, reviewed documented beta blocker date and time   Airway Mallampati: II  TM Distance: >3 FB     Dental  (+) Chipped   Pulmonary Current Smoker,           Cardiovascular      Neuro/Psych    GI/Hepatic GERD  ,  Endo/Other  diabetes, Type 1  Renal/GU      Musculoskeletal  (+) Arthritis ,   Abdominal   Peds  Hematology   Anesthesia Other Findings She got methadone at 4 pm. Discussed diabetes management with her. See my note.  Reproductive/Obstetrics                             Anesthesia Physical Anesthesia Plan  ASA: III  Anesthesia Plan: General   Post-op Pain Management:    Induction: Intravenous  PONV Risk Score and Plan:   Airway Management Planned: LMA  Additional Equipment:   Intra-op Plan:   Post-operative Plan:   Informed Consent: I have reviewed the patients History and Physical, chart, labs and discussed the procedure including the risks, benefits and alternatives for the proposed anesthesia with the patient or authorized representative who has indicated his/her understanding and acceptance.     Plan Discussed with: CRNA  Anesthesia Plan Comments:         Anesthesia Quick Evaluation

## 2017-06-06 NOTE — Progress Notes (Signed)
CBG is 306. Dr. Gilman Schmidt notified and order to follow Sliding Scale Received.

## 2017-06-06 NOTE — ED Notes (Signed)
ED Provider at bedside. 

## 2017-06-06 NOTE — ED Notes (Signed)
OBGYN at bedside

## 2017-06-06 NOTE — ED Provider Notes (Signed)
[facility] Emergency Department Provider Note   ____________________________________________   First MD Initiated Contact with Patient 06/06/17 [phone]     (approximate)  I have reviewed the triage vital signs and the nursing notes.   HISTORY  Chief Complaint Flank Pain    HPI CRISTA NUON is a 37 y.o. female the previous history of a hysterectomy and a right ovarian oophorectomy  The patient reports she had sudden onset this morning of severe pain in her right lower pelvis and right flank.  She reports nausea.  No vomiting.  No recent fevers or chills.  No recent illness.  No diarrhea or constipation.  She reports a severe 10 out of 10 excruciating and sharp pain in the right lower abdomen and right flank.  Past Medical History:  Diagnosis Date  . Arthritis    back  . Colon polyp   . Diabetes 1.5, managed as type 1 ([place])   . Drug addiction ([place])    hx (pain pills after c-sect)  . GERD (gastroesophageal reflux disease)   . Pelvic floor dysfunction   . Urine incontinence     Patient Active Problem List   Diagnosis Date Noted  . Acute osteomyelitis of left foot ([place]) 01/15/2016  . Cellulitis of great toe of left foot 01/15/2016  . Hyponatremia 01/15/2016  . Ingrown left greater toenail 01/15/2016  . Prerenal renal failure 01/15/2016  . Smoking history 01/15/2016  . Nausea 09/28/2015  . Leg swelling 09/28/2015  . Type 1 diabetes mellitus without complication ([place]) 88/[id number]/[id number]  . Generalized abdominal pain 09/28/2015  . Chronic constipation   . Benign neoplasm of descending colon   . Benign neoplasm of sigmoid colon     Past Surgical History:  Procedure Laterality Date  . ABDOMINAL HYSTERECTOMY  2005  . CESAREAN SECTION  2003  . COLONOSCOPY WITH PROPOFOL N/A 09/14/2015   Procedure: COLONOSCOPY WITH PROPOFOL;  Surgeon: Lucilla Lame, MD;  Location: [place];  Service: Endoscopy;  Laterality: N/A;  Diabetic - insulin  .  ESOPHAGOGASTRODUODENOSCOPY (EGD) WITH PROPOFOL N/A 06/19/2016   Procedure: ESOPHAGOGASTRODUODENOSCOPY (EGD) WITH PROPOFOL;  Surgeon: Jonathon Bellows, MD;  Location: [facility] ENDOSCOPY;  Service: Endoscopy;  Laterality: N/A;  . LAPAROSCOPY    . LYSIS OF ADHESION    . POLYPECTOMY  09/14/2015   Procedure: POLYPECTOMY;  Surgeon: Lucilla Lame, MD;  Location: [place];  Service: Endoscopy;;  . RIGHT OOPHORECTOMY  2012  . TONSILLECTOMY  1987   and adenoids  . TUBAL LIGATION  2004    Prior to Admission medications   Medication Sig Start Date End Date Taking? Authorizing Provider  bisacodyl (DULCOLAX) 5 MG EC tablet Take 1 tablet (5 mg total) by mouth at bedtime as needed for moderate constipation. For 1 week. Patient not taking: Reported on 06/11/2016 10/01/15   Luciana Axe, NP  furosemide (LASIX) 20 MG tablet Take 1 tablet (20 mg total) by mouth daily. Patient not taking: Reported on 11/21/2015 09/28/15   Luciana Axe, NP  hyoscyamine (LEVSIN SL) 0.125 MG SL tablet Place 1 tablet (0.125 mg total) under the tongue every 4 (four) hours as needed. 11/21/15   Lucilla Lame, MD  insulin aspart (NOVOLOG) 100 UNIT/ML injection Inject into the skin.    [provider]  Insulin Glargine (LANTUS SOLOSTAR) 100 UNIT/ML Solostar Pen Inject into the skin.    [provider]  Insulin Glargine (LANTUS) 100 UNIT/ML Solostar Pen Inject 52 Units into the skin daily at 10  pm. Patient not taking: Reported on 06/11/2016 09/28/15   Luciana Axe, NP  Insulin Pen Needle 31G X 5 MM MISC 1 each by Does not apply route daily. Patient not taking: Reported on 06/11/2016 09/28/15   Luciana Axe, NP  methadone (DOLOPHINE) 10 MG/ML solution Take 170 mg by mouth daily.    [provider]  omeprazole (PRILOSEC) 40 MG capsule Take 1 capsule (40 mg total) by mouth daily. 06/11/16   Jonathon Bellows, MD  ondansetron (ZOFRAN) 4 MG tablet Take 1 tablet (4 mg total) by mouth every 8 (eight) hours as needed for  nausea or vomiting. Patient not taking: Reported on 11/21/2015 09/28/15   Luciana Axe, NP  polyethylene glycol powder (GLYCOLAX/MIRALAX) powder Take 255 g by mouth daily. 06/11/16   Jonathon Bellows, MD  promethazine (PHENERGAN) 25 MG tablet Take 1 tablet (25 mg total) by mouth every 6 (six) hours as needed for nausea or vomiting. 06/03/16   Lucilla Lame, MD    Allergies Cephalexin and Penicillins  Family History  Problem Relation Age of Onset  . Depression Mother   . Diabetes Father   . Alcohol abuse Father   . Heart disease Maternal Grandmother   . Depression Maternal Grandmother   . Dementia Maternal Grandmother     Social History Social History   Tobacco Use  . Smoking status: Current Every Day Smoker    Packs/day: 1.00    Years: 24.00    Pack years: 24.00    Types: Cigarettes  . Smokeless tobacco: Never Used  . Tobacco comment: has cut down from 2 PPD  Substance Use Topics  . Alcohol use: No  . Drug use: No    Review of Systems Constitutional: No fever/chills Eyes: No visual changes. ENT: No sore throat. Cardiovascular: Denies chest pain. Respiratory: Denies shortness of breath. Gastrointestinal:  No diarrhea.  No constipation. Genitourinary: Negative for dysuria.  Denies pregnancy.  Reports previous hysterectomy at [facility]. Musculoskeletal: Negative for back pain. Skin: Negative for rash. Neurological: Negative for headaches, focal weakness or numbness.    ____________________________________________   PHYSICAL EXAM:  VITAL SIGNS: ED Triage Vitals  Enc Vitals Group     BP 06/06/17 0741 (!) 145/129     Pulse Rate 06/06/17 0741 98     Resp 06/06/17 0741 18     Temp 06/06/17 0741 98.3 F (36.8 C)     Temp Source 06/06/17 0741 Oral     SpO2 06/06/17 0741 99 %     Weight 06/06/17 0740 170 lb (77.1 kg)     Height 06/06/17 0740 5\' 6"  (1.676 m)     Head Circumference --      Peak Flow --      Pain Score 06/06/17 0740 10     Pain Loc --      Pain Edu? --        Excl. in [place]? --     Constitutional: Alert and oriented.  Sitting, appears uncomfortable, appears in severe pain wincing and tearful reporting severe pain in the right lower flank. Eyes: Conjunctivae are normal. Head: Atraumatic. Nose: No congestion/rhinnorhea. Mouth/Throat: Mucous membranes are moist. Neck: No stridor.   Cardiovascular: Normal rate, regular rhythm. Grossly normal heart sounds.  Good peripheral circulation. Respiratory: Normal respiratory effort.  No retractions. Lungs CTAB. Gastrointestinal: Soft and nontender except for moderate to severe tenderness in the right flank and right lower quadrant. No distention. Musculoskeletal: No lower extremity tenderness nor edema. Neurologic:  Normal speech and language.  No gross focal neurologic deficits are appreciated.  Skin:  Skin is warm, dry and intact. No rash noted. Psychiatric: Mood and affect are normal. Speech and behavior are normal.  ____________________________________________   LABS (all labs ordered are listed, but only abnormal results are displayed)  Labs Reviewed  CBC - Abnormal; Notable for the following components:      Result Value   Hemoglobin 11.7 (*)    HCT 34.3 (*)    All other components within normal limits  COMPREHENSIVE METABOLIC PANEL - Abnormal; Notable for the following components:   Glucose, Bld 330 (*)    BUN 22 (*)    Creatinine, Ser 1.56 (*)    AST 13 (*)    ALT 11 (*)    GFR calc non Af Amer 41 (*)    GFR calc Af Amer 48 (*)    All other components within normal limits  URINALYSIS, COMPLETE (UACMP) WITH MICROSCOPIC - Abnormal; Notable for the following components:   Color, Urine STRAW (*)    APPearance CLEAR (*)    Glucose, UA >=500 (*)    Hgb urine dipstick MODERATE (*)    Protein, ur 100 (*)    Bacteria, UA FEW (*)    Squamous Epithelial / LPF 0-5 (*)    All other components within normal limits  LIPASE, BLOOD  CA 125  CEA    ____________________________________________  EKG   ____________________________________________  RADIOLOGY  CT scan results reviewed, severe right hydronephrosis.  Abnormal cystic structure in the right pelvis.  Reviewed records, patient has previous imaging studies at [facility] that indicates she has had previous hysterectomy and right-sided oophorectomy. ____________________________________________   PROCEDURES  Procedure(s) performed: None  Procedures  Critical Care performed: No  ____________________________________________   INITIAL IMPRESSION / ASSESSMENT AND PLAN / ED COURSE  Pertinent labs & imaging results that were available during my care of the patient were reviewed by me and considered in my medical decision making (see chart for details).  Differential diagnosis includes but is not limited to, abdominal perforation, aortic dissection, cholecystitis, appendicitis, diverticulitis, colitis, esophagitis/gastritis, kidney stone, pyelonephritis, urinary tract infection, aortic aneurysm. All are considered in decision and treatment plan. Based upon the patient's presentation and risk factors, will proceed with CT scan to further evaluate for etiology of right lower quadrant pain  ----------------------------------------- 11:26 AM on 06/06/2017 -----------------------------------------  Dr. Marcelline Mates from [facility] come and evaluate.  It is unclear to me what is causing the patient's symptoms, but clearly radiology seems indicate that some type of mass or tumor or other could be obstructing her right ureter.  The patient has had a previous right-sided oophorectomy, would not make sense that this could represent torsion.  Await OB/GYN consult for further recommendations.  Reevaluate the patient and she is alert and oriented, mother at bedside she continues to appear in moderate to severe pain rating her pain an 8 out of 10 now with little relief thus far.  I have ordered  additional 0.5 mg Dilaudid.  She has normal alertness, normal respiratory pattern, and we will continue to maintain her on continuous telemetry capnography especially given the high opioid requirement.  Of note, patient is also on methadone, making her not nave to opioids.  Anticipate admission, pending GYN consultation.  Patient agreeable and understanding.   Clinical Course as of Jun 06 1308  Sat Jun 06, 2017  1212 Dr. Marcelline Mates saw patient briefly, but patient identified that she is actually a [facility] patient.  I originally  thought that she was a [facility] patient.  I have called Dr. Nechama Guard of [facility] who will see the patient in consult in the ER   [MQ]    Clinical Course User Index [MQ] Delman Kitten, MD   ----------------------------------------- 1:10 PM on 06/06/2017 -----------------------------------------  Patient alert, understanding of plan for admission to the hospital.  Gynecology admitting the patient for further evaluation and workup.  Ovarian ultrasound ordered as requested gynecology team.  Also discussed case and care with Dr. Gloriann Loan of urology, Dr. Gloriann Loan notes that he would be happy to consult and will see the patient this afternoon for further recommendations as well.  ____________________________________________   FINAL CLINICAL IMPRESSION(S) / ED DIAGNOSES  Final diagnoses:  Flank pain  Intractable pain  Hydronephrosis, right      NEW MEDICATIONS STARTED DURING THIS VISIT:  New Prescriptions   No medications on file     Note:  This document was prepared using Dragon voice recognition software and may include unintentional dictation errors.     Delman Kitten, MD 06/06/17 1311

## 2017-06-06 NOTE — Progress Notes (Signed)
CHG bath completed. Pt states she is unable to remove facial piercings, will notify OR nurse.

## 2017-06-06 NOTE — Anesthesia Postprocedure Evaluation (Signed)
Anesthesia Post Note  Patient: Nichole Wiley  Procedure(s) Performed: CYSTOSCOPY WITH RETROGRADE PYELOGRAM/URETERAL STENT PLACEMENT (Right )  Patient location during evaluation: PACU Anesthesia Type: General Level of consciousness: awake and alert Pain management: pain level controlled Vital Signs Assessment: post-procedure vital signs reviewed and stable Respiratory status: spontaneous breathing, nonlabored ventilation, respiratory function stable and patient connected to nasal cannula oxygen Cardiovascular status: blood pressure returned to baseline and stable Postop Assessment: no apparent nausea or vomiting Anesthetic complications: no     Last Vitals:  Vitals:   06/06/17 1926 06/06/17 2032  BP: 131/82 138/84  Pulse: 85 81  Resp: 18 16  Temp: 36.7 C 36.9 C  SpO2: 96% 97%    Last Pain:  Vitals:   06/06/17 2032  TempSrc: Oral  PainSc:                  Akshar Starnes S

## 2017-06-06 NOTE — ED Triage Notes (Signed)
C/O right flank pain onxet of symptoms this morning at 0400.  Denies vomiting, c/o nausea.

## 2017-06-06 NOTE — Anesthesia Procedure Notes (Signed)
Procedure Name: LMA Insertion Performed by: Doreen Salvage, CRNA Pre-anesthesia Checklist: Patient identified, Patient being monitored, Timeout performed, Emergency Drugs available and Suction available Patient Re-evaluated:Patient Re-evaluated prior to induction Oxygen Delivery Method: Circle system utilized Preoxygenation: Pre-oxygenation with 100% oxygen Induction Type: IV induction Ventilation: Mask ventilation without difficulty LMA: LMA inserted LMA Size: 3.5 Tube type: Oral Number of attempts: 1 Placement Confirmation: positive ETCO2 and breath sounds checked- equal and bilateral Tube secured with: Tape Dental Injury: Teeth and Oropharynx as per pre-operative assessment

## 2017-06-06 NOTE — Anesthesia Post-op Follow-up Note (Signed)
Anesthesia QCDR form completed.        

## 2017-06-06 NOTE — Progress Notes (Signed)
Spoke with Dr. Marcello Moores (Anesth) regarding pt elevated BG. Rechecked 1 hr after 3 units Novolog, BG is now 339. Pt being transported to OR for surgical procedure. Pt stated she has delayed changes in her BG after insulin, and usually is longer than 1 hr for BG to change. Notified Dr. Marcello Moores of pt's statement, he stated "would rather have pt high than too low for surgery, hold off on insulin and we will check immediately post procedure". Pt agreeable to this, she stated she was uneasy about taking more insulin without eating. Plan to recheck BG when pt arrives back to floor. Will notify MD with result.

## 2017-06-06 NOTE — ED Notes (Signed)
Pt reports RLQ pain that woke her up from sleep at apx 0400 this am, Reports that she has had her right ovary removed. TTP. Reports pain sharp and constant in nature.

## 2017-06-06 NOTE — ED Notes (Signed)
Vaginal exam performed by OBGYN with myself at bedside.

## 2017-06-06 NOTE — ED Notes (Signed)
Emesis s/p medication administration. Emesis bag provided.

## 2017-06-06 NOTE — Interval H&P Note (Signed)
History and Physical Interval Note:  06/06/2017 4:11 PM  Raina Mina  has presented today for surgery, with the diagnosis of right flank pain,right hydronephrosis,right pelvic cystic mass obstructing distal right ureter  The various methods of treatment have been discussed with the patient and family. After consideration of risks, benefits and other options for treatment, the patient has consented to  Procedure(s): CYSTOSCOPY WITH RETROGRADE PYELOGRAM/URETERAL STENT PLACEMENT (Right) as a surgical intervention .  The patient's history has been reviewed, patient examined, no change in status, stable for surgery.  I have reviewed the patient's chart and labs.  Questions were answered to the patient's satisfaction.     Marton Redwood, III

## 2017-06-06 NOTE — H&P (Signed)
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  During that excision, there was rupture of the ovarian cyst containing the cystadenoma.  Patient presented this morning with sharp right-sided flank pain.  It awoke her from sleep as he also had an episode of emesis.  Denies hematuria or dysuria.  Her pain improved with pain medication in the emergency department.  Her creatinine is elevated at 1.56 with no leukocytosis.  She does have a history of substance abuse and takes methadone for this.  Urinalysis negative except for few bacteria, 6-30 RBC per high-power field.  Past Medical History:  Diagnosis Date  . Arthritis    back  . Colon polyp   . Diabetes 1.5, managed as type 1 ([place])   . Drug addiction ([place])    hx (pain pills after c-sect)  . GERD (gastroesophageal reflux disease)   . Pelvic floor dysfunction   . Urine incontinence    Past Surgical History:  Procedure Laterality Date  . ABDOMINAL HYSTERECTOMY  2005  . CESAREAN SECTION  2003  . COLONOSCOPY WITH PROPOFOL N/A 09/14/2015   Procedure: COLONOSCOPY WITH PROPOFOL;  Surgeon: Lucilla Lame, MD;  Location: [place];  Service: Endoscopy;  Laterality: N/A;  Diabetic - insulin  . ESOPHAGOGASTRODUODENOSCOPY (EGD) WITH PROPOFOL N/A 06/19/2016   Procedure: ESOPHAGOGASTRODUODENOSCOPY (EGD) WITH PROPOFOL;  Surgeon: Jonathon Bellows, MD;  Location: [facility];  Service: Endoscopy;  Laterality: N/A;  . LAPAROSCOPY    . LYSIS OF ADHESION    . POLYPECTOMY  09/14/2015   Procedure: POLYPECTOMY;  Surgeon: Lucilla Lame, MD;  Location: [place];  Service: Endoscopy;;  . RIGHT OOPHORECTOMY  2012  . TONSILLECTOMY  1987   and adenoids  . TUBAL LIGATION  2004  . VULVECTOMY  2011   lipoma     Home Medications:  Medications Prior to Admission  Medication Sig Dispense Refill Last Dose  . insulin aspart (NOVOLOG) 100 UNIT/ML injection Inject 0-4 Units into the skin 3 (three) times daily with meals.    PRN at PRN  . Insulin Glargine (LANTUS SOLOSTAR) 100 UNIT/ML Solostar Pen Inject 42 Units into the skin at bedtime.    06/04/2017 at 2000  . methadone (DOLOPHINE) 10 MG/ML solution Take 170 mg by mouth daily.   06/05/2017 at 0700  . bisacodyl (DULCOLAX) 5 MG EC tablet Take 1 tablet (5 mg total) by mouth at bedtime as needed for moderate constipation. For 1 week. (Patient not taking: Reported on 06/11/2016) 30 tablet 0 Not Taking at Unknown time  . furosemide (LASIX) 20 MG tablet Take 1 tablet (20 mg total) by mouth daily. (Patient not taking: Reported on 11/21/2015) 30 tablet 5 Not Taking at Unknown time  . hyoscyamine (LEVSIN SL) 0.125 MG SL tablet Place 1 tablet (0.125 mg total) under the tongue every 4 (four) hours as needed. (Patient not taking: Reported on 06/06/2017) 30 tablet 3 Not Taking at Unknown time  . Insulin Glargine (LANTUS) 100 UNIT/ML Solostar Pen Inject 52 Units into the skin daily at 10 pm. (Patient not taking: Reported on 06/11/2016) 15 mL 11 Not Taking at Unknown time  . omeprazole (PRILOSEC) 40 MG capsule Take 1 capsule (40 mg total) by mouth daily. (Patient not taking: Reported on 06/06/2017) 90 capsule 3 Not Taking at Unknown time  .  ondansetron (ZOFRAN) 4 MG tablet Take 1 tablet (4 mg total) by mouth every 8 (eight) hours as needed for nausea or vomiting. (Patient not taking: Reported on 11/21/2015) 20 tablet 1 Not Taking at Unknown time  . polyethylene glycol powder (GLYCOLAX/MIRALAX) powder Take 255 g by mouth daily. (Patient not taking: Reported on 06/06/2017) 255 g 3 Not Taking at Unknown time  . promethazine (PHENERGAN) 25 MG tablet Take 1 tablet (25 mg total) by mouth every 6 (six) hours as needed for nausea or vomiting. (Patient not taking: Reported on 06/06/2017) 30  tablet 0 Not Taking at Unknown time   Allergies:  Allergies  Allergen Reactions  . Cephalexin Other (See Comments) and Rash    Unknown  . Penicillins Rash    Has patient had a PCN reaction causing immediate rash, facial/tongue/throat swelling, SOB or lightheadedness with hypotension: No Has patient had a PCN reaction causing severe rash involving mucus membranes or skin necrosis: No Has patient had a PCN reaction that required hospitalization: No Has patient had a PCN reaction occurring within the last 10 years: No If all of the above answers are "NO", then may proceed with Cephalosporin use.    Family History  Problem Relation Age of Onset  . Depression Mother   . Diabetes Father   . Alcohol abuse Father   . Heart disease Maternal Grandmother   . Depression Maternal Grandmother   . Dementia Maternal Grandmother    Social History:  reports that she has been smoking cigarettes.  She has a 24.00 pack-year smoking history. She has never used smokeless tobacco. She reports that she does not drink alcohol or use drugs.  ROS: A complete review of systems was performed.  All systems are negative except for pertinent findings as noted. ROS   Physical Exam:  Vital signs in last 24 hours: Temp:  [98.3 F (36.8 C)-98.5 F (36.9 C)] 98.5 F (36.9 C) (03/30 1511) Pulse Rate:  [82-98] 89 (03/30 1511) Resp:  [18-20] 20 (03/30 1511) BP: (126-170)/(73-129) 126/89 (03/30 1511) SpO2:  [99 %-100 %] 100 % (03/30 1511) Weight:  [77.1 kg (170 lb)] 77.1 kg (170 lb) (03/30 0740) General:  Alert and oriented, No acute distress HEENT: Normocephalic, atraumatic Neck: No JVD or lymphadenopathy Cardiovascular: Regular rate and rhythm Lungs: Regular rate and effort Abdomen: Soft, nontender, nondistended, no abdominal masses Back: No CVA tenderness Extremities: No edema Neurologic: Grossly intact  Laboratory Data:  Results for orders placed or performed during the hospital encounter of 06/06/17  (from the past 24 hour(s))  CBC     Status: Abnormal   Collection Time: 06/06/17  7:54 AM  Result Value Ref Range   WBC 10.3 3.6 - 11.0 K/uL   RBC 3.95 3.80 - 5.20 MIL/uL   Hemoglobin 11.7 (L) 12.0 - 16.0 g/dL   HCT 34.3 (L) 35.0 - 47.0 %   MCV 86.9 80.0 - 100.0 fL   MCH 29.6 26.0 - 34.0 pg   MCHC 34.0 32.0 - 36.0 g/dL   RDW 14.1 11.5 - 14.5 %   Platelets 175 150 - 440 K/uL  Comprehensive metabolic panel     Status: Abnormal   Collection Time: 06/06/17  7:54 AM  Result Value Ref Range   Sodium 135 135 - 145 mmol/L   Potassium 3.9 3.5 - 5.1 mmol/L   Chloride 102 101 - 111 mmol/L   CO2 25 22 - 32 mmol/L   Glucose, Bld 330 (H) 65 - 99 mg/dL   BUN 22 (H)  6 - 20 mg/dL   Creatinine, Ser 1.56 (H) 0.44 - 1.00 mg/dL   Calcium 9.2 8.9 - 10.3 mg/dL   Total Protein 6.9 6.5 - 8.1 g/dL   Albumin 3.6 3.5 - 5.0 g/dL   AST 13 (L) 15 - 41 U/L   ALT 11 (L) 14 - 54 U/L   Alkaline Phosphatase 91 38 - 126 U/L   Total Bilirubin 0.5 0.3 - 1.2 mg/dL   GFR calc non Af Amer 41 (L) >60 mL/min   GFR calc Af Amer 48 (L) >60 mL/min   Anion gap 8 5 - 15  Lipase, blood     Status: None   Collection Time: 06/06/17  7:54 AM  Result Value Ref Range   Lipase 22 11 - 51 U/L  Urinalysis, Complete w Microscopic     Status: Abnormal   Collection Time: 06/06/17  7:54 AM  Result Value Ref Range   Color, Urine STRAW (A) YELLOW   APPearance CLEAR (A) CLEAR   Specific Gravity, Urine 1.008 1.005 - 1.030   pH 6.0 5.0 - 8.0   Glucose, UA >=500 (A) NEGATIVE mg/dL   Hgb urine dipstick MODERATE (A) NEGATIVE   Bilirubin Urine NEGATIVE NEGATIVE   Ketones, ur NEGATIVE NEGATIVE mg/dL   Protein, ur 100 (A) NEGATIVE mg/dL   Nitrite NEGATIVE NEGATIVE   Leukocytes, UA NEGATIVE NEGATIVE   RBC / HPF 6-30 0 - 5 RBC/hpf   WBC, UA 0-5 0 - 5 WBC/hpf   Bacteria, UA FEW (A) NONE SEEN   Squamous Epithelial / LPF 0-5 (A) NONE SEEN   No results found for this or any previous visit (from the past 240 hour(s)). Creatinine: Recent  Labs    06/06/17 0754  CREATININE 1.56*   CT of the abdomen and pelvis reviewed: IMPRESSION: Severe RIGHT hydroureteronephrosis to the distal RIGHT ureter, presumably caused by a 3 x 3.5 cm cystic structure in the RIGHT pelvis. This may represent a RIGHT ovarian cyst given that there appears to be some RIGHT ovarian tissue on the 2014 CT.   Impression/Assessment:  1. Right hydroureteronephrosis secondary to extrinsic compression of unknown complex cystic mass in the right lower quadrant of the abdomen pressing the distal ureter.  2. Acute renal insufficiency  Plan:  Proceed to the operating room for cystoscopy with right retrograde pyelogram, right possible diagnostic ureteroscopy, right ureteral stent placement.  She understands potential risk including but not limited to bleeding, infection, injury to surrounding structures, need for additional procedures such as nephrostomy tube if this is unsuccessful, pain from the ureteral stent, irritation from the ureteral stent.  After full discussion, she wishes to proceed.  Marton Redwood, III 06/06/2017, 4:05 PM

## 2017-06-06 NOTE — H&P (Signed)
Hisotry and Physical  Nichole Wiley is an 37 y.o. female.  HPI: Patient presented to the emergency room today for right-sided flank pain.  Patient describes the pain as sharp and wrapping around her right side from the front to the back.  Patient states that the pain started at 4 AM.  Patient reports that she was sleeping at the time awoke her from her sleep.  Patient reports nausea and vomiting x1 this morning.  Patient denies any dysuria patient denies any vaginal bleeding patient denies any fever.  Patient has been having normal bowel movements. This is the first time the patient has had pain like this. Nothing has helped the pain feel better so far. She denies weight changes. She denies early satiety. She denies pelvic pain or pressure. Patient reports that she is not sexually active. She does have dyspareunia, but that has been a long standing problem.    Patient reports that she takes methadone for a history of substance abuse. She reports that her does is 170 mg a day. She says that she has not taken her methadone today.  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 She has a abdominal hysterectomy in 2005. She still has her left ovary.   Patient is in pain and her answers to questions was limited.   Review of her historical records in [place] indicate that in 2012, when she had her right oophorectomy, she had extensive adhesive disease.  It is noted in the operative report that the uterus and left ovary were surgically absent.  It was also noted in the operative report that there was rupture of the ovarian cyst intraoperatively which does make recurrence more likely.   OB History  Gravida Para Term Preterm AB Living  2 2 2     2   SAB TAB Ectopic Multiple Live Births          2    # Outcome Date GA Lbr Len/2nd Weight Sex Delivery Anes PTL Lv  2 Term 2003    M CS-LTranv   LIV  1 Term 1997    F Vag-Spont   LIV     Past Medical History:  Diagnosis Date   . Arthritis    back  . Colon polyp   . Diabetes 1.5, managed as type 1 ([place])   . Drug addiction ([place])    hx (pain pills after c-sect)  . GERD (gastroesophageal reflux disease)   . Pelvic floor dysfunction   . Urine incontinence     Past Surgical History:  Procedure Laterality Date  . ABDOMINAL HYSTERECTOMY  2005  . CESAREAN SECTION  2003  . COLONOSCOPY WITH PROPOFOL N/A 09/14/2015   Procedure: COLONOSCOPY WITH PROPOFOL;  Surgeon: Lucilla Lame, MD;  Location: [facility];  Service: Endoscopy;  Laterality: N/A;  Diabetic - insulin  . ESOPHAGOGASTRODUODENOSCOPY (EGD) WITH PROPOFOL N/A 06/19/2016   Procedure: ESOPHAGOGASTRODUODENOSCOPY (EGD) WITH PROPOFOL;  Surgeon: Jonathon Bellows, MD;  Location: [facility];  Service: Endoscopy;  Laterality: N/A;  . LAPAROSCOPY    . LYSIS OF ADHESION    . POLYPECTOMY  09/14/2015   Procedure: POLYPECTOMY;  Surgeon: Lucilla Lame, MD;  Location: [facility];  Service: Endoscopy;;  . RIGHT OOPHORECTOMY  2012  . TONSILLECTOMY  1987   and adenoids  . TUBAL LIGATION  2004  . VULVECTOMY  2011   lipoma    Family History  Problem Relation Age of Onset  . Depression Mother   .  Diabetes Father   . Alcohol abuse Father   . Heart disease Maternal Grandmother   . Depression Maternal Grandmother   . Dementia Maternal Grandmother    Patient denies any family history of breast, uterine, or ovarian cancer.   Social History:  reports that she has been smoking cigarettes.  She has a 24.00 pack-year smoking history. She has never used smokeless tobacco. She reports that she does not drink alcohol or use drugs.  Allergies:  Allergies  Allergen Reactions  . Cephalexin Other (See Comments) and Rash    Unknown  . Penicillins Rash    Has patient had a PCN reaction causing immediate rash, facial/tongue/throat swelling, SOB or lightheadedness with hypotension: No Has patient had a PCN reaction causing severe rash involving mucus membranes or skin  necrosis: No Has patient had a PCN reaction that required hospitalization: No Has patient had a PCN reaction occurring within the last 10 years: No If all of the above answers are "NO", then may proceed with Cephalosporin use.    Medications: I have reviewed the patient's current medications.  Results for orders placed or performed during the hospital encounter of 06/06/17 (from the past 48 hour(s))  CBC     Status: Abnormal   Collection Time: 06/06/17  7:54 AM  Result Value Ref Range   WBC 10.3 3.6 - 11.0 K/uL   RBC 3.95 3.80 - 5.20 MIL/uL   Hemoglobin 11.7 (L) 12.0 - 16.0 g/dL   HCT 34.3 (L) 35.0 - 47.0 %   MCV 86.9 80.0 - 100.0 fL   MCH 29.6 26.0 - 34.0 pg   MCHC 34.0 32.0 - 36.0 g/dL   RDW 14.1 11.5 - 14.5 %   Platelets 175 150 - 440 K/uL    Comment: Performed at [facility], [address] [place], [place] [address]  Comprehensive metabolic panel     Status: Abnormal   Collection Time: 06/06/17  7:54 AM  Result Value Ref Range   Sodium 135 135 - 145 mmol/L   Potassium 3.9 3.5 - 5.1 mmol/L   Chloride 102 101 - 111 mmol/L   CO2 25 22 - 32 mmol/L   Glucose, Bld 330 (H) 65 - 99 mg/dL   BUN 22 (H) 6 - 20 mg/dL   Creatinine, Ser 1.56 (H) 0.44 - 1.00 mg/dL   Calcium 9.2 8.9 - 10.3 mg/dL   Total Protein 6.9 6.5 - 8.1 g/dL   Albumin 3.6 3.5 - 5.0 g/dL   AST 13 (L) 15 - 41 U/L   ALT 11 (L) 14 - 54 U/L   Alkaline Phosphatase 91 38 - 126 U/L   Total Bilirubin 0.5 0.3 - 1.2 mg/dL   GFR calc non Af Amer 41 (L) >60 mL/min   GFR calc Af Amer 48 (L) >60 mL/min    Comment: (NOTE) The eGFR has been calculated using the CKD EPI equation. This calculation has not been validated in all clinical situations. eGFR's persistently <60 mL/min signify possible Chronic Kidney Disease.    Anion gap 8 5 - 15    Comment: Performed at [facility], [address] [place], [place] [address]  Lipase, blood     Status: None   Collection Time: 06/06/17  7:54 AM  Result Value  Ref Range   Lipase 22 11 - 51 U/L    Comment: Performed at [facility], [address] [place], [place] [address]  Urinalysis, Complete w Microscopic     Status: Abnormal  Collection Time: 06/06/17  7:54 AM  Result Value Ref Range   Color, Urine STRAW (A) YELLOW   APPearance CLEAR (A) CLEAR   Specific Gravity, Urine 1.008 1.005 - 1.030   pH 6.0 5.0 - 8.0   Glucose, UA >=500 (A) NEGATIVE mg/dL   Hgb urine dipstick MODERATE (A) NEGATIVE   Bilirubin Urine NEGATIVE NEGATIVE   Ketones, ur NEGATIVE NEGATIVE mg/dL   Protein, ur 100 (A) NEGATIVE mg/dL   Nitrite NEGATIVE NEGATIVE   Leukocytes, UA NEGATIVE NEGATIVE   RBC / HPF 6-30 0 - 5 RBC/hpf   WBC, UA 0-5 0 - 5 WBC/hpf   Bacteria, UA FEW (A) NONE SEEN   Squamous Epithelial / LPF 0-5 (A) NONE SEEN    Comment: Performed at [facility], [address], [place], [place] [address]    Ct Abdomen Pelvis W Contrast  Result Date: 06/06/2017 CLINICAL DATA:  37 year old female with acute RIGHT abdominal and pelvic pain. History of hysterectomy and RIGHT oophorectomy in 2012. EXAM: CT ABDOMEN AND PELVIS WITH CONTRAST TECHNIQUE: Multidetector CT imaging of the abdomen and pelvis was performed using the standard protocol following bolus administration of intravenous contrast. CONTRAST:  92m ISOVUE-300 IOPAMIDOL (ISOVUE-300) INJECTION 61% COMPARISON:  07/15/2012 CT FINDINGS: Lower chest: No acute abnormality Hepatobiliary: The liver and gallbladder are unremarkable. No biliary dilatation. Pancreas: Unremarkable Spleen: Unremarkable Adrenals/Urinary Tract: There is severe RIGHT hydroureteronephrosis to the distal RIGHT ureter. A 3 x 3.5 cm cystic structure within the RIGHT pelvis at the level of obstruction is noted (2:72). The very distal RIGHT ureter is collapsed. RIGHT perinephric and periureteral inflammation is noted. The LEFT kidney, adrenal glands and bladder are unremarkable. Stomach/Bowel: Stomach is within normal limits.  Appendix appears normal. No evidence of bowel wall thickening, distention, or inflammatory changes. Vascular/Lymphatic: Aortic atherosclerosis. No enlarged abdominal or pelvic lymph nodes. Reproductive: Status post hysterectomy. The LEFT adnexal region is unremarkable. The 3 x 3.5 cm cystic structure in the RIGHT adnexal region obstructing the distal RIGHT ureter as discussed above could represent an ovarian cyst as there appears to be a small amount of RIGHT ovarian tissue on the 2014 CT, after the reported RIGHT oophorectomy in 2012. Other: No ascites, pneumoperitoneum or abdominal wall hernia. Musculoskeletal: No acute or suspicious bony abnormalities. IMPRESSION: 1. Severe RIGHT hydroureteronephrosis to the distal RIGHT ureter, presumably caused by a 3 x 3.5 cm cystic structure in the RIGHT pelvis. This may represent a RIGHT ovarian cyst given that there appears to be some RIGHT ovarian tissue on the 2014 CT. 2.  Aortic Atherosclerosis (ICD10-I70.0). Electronically Signed   By: JMargarette CanadaM.D.   On: 06/06/2017 10:02    Review of Systems  Constitutional: Negative for chills, fever, malaise/fatigue and weight loss.  HENT: Negative for congestion, hearing loss and sinus pain.   Eyes: Negative for blurred vision and double vision.  Respiratory: Negative for cough, sputum production, shortness of breath and wheezing.   Cardiovascular: Negative for chest pain, palpitations, orthopnea and leg swelling.  Gastrointestinal: Negative for abdominal pain, constipation, diarrhea, nausea and vomiting.  Genitourinary: Negative for dysuria, flank pain, frequency, hematuria and urgency.  Musculoskeletal: Negative for back pain, falls and joint pain.  Skin: Negative for itching and rash.  Neurological: Negative for dizziness and headaches.  Psychiatric/Behavioral: Negative for depression, substance abuse and suicidal ideas. The patient is not nervous/anxious.    Blood pressure (!) 157/83, pulse 88, temperature  98.3 F (36.8 C), temperature source Oral, resp. rate 18, height 5' 6"  (1.676  m), weight 170 lb (77.1 kg), SpO2 100 %. Physical Exam  Nursing note and vitals reviewed. Constitutional: She is oriented to person, place, and time. She appears well-developed and well-nourished.  HENT:  Head: Normocephalic and atraumatic.  Cardiovascular: Normal rate and regular rhythm.  Respiratory: Effort normal and breath sounds normal.  GI: Soft. Bowel sounds are normal. She exhibits no distension and no mass. There is no tenderness. There is no rebound and no guarding.  Genitourinary: No labial fusion. There is no rash or lesion on the right labia. There is no rash or lesion on the left labia. There is tenderness in the vagina. No erythema or bleeding in the vagina. No foreign body in the vagina.  Genitourinary Comments: Patient did not tolerate exam. No masses appreciated, but exam was limited. Vagina narrow and atrophic.   Musculoskeletal: Normal range of motion.  Neurological: She is alert and oriented to person, place, and time.  Skin: Skin is warm and dry.  Psychiatric: She has a normal mood and affect. Her behavior is normal. Judgment and thought content normal.    Assessment/Plan: 37 yo with right sided flank pain secondary to right hydronephrosis.  Cystic mass seen close to right ureter: This could be related to a recurrence of a mucinous cystadenoma, but this is rare after total oophorectomy, but there was surgical spill which can increase recurrence rates. Will admit patient for pain management. Will obtain a transvaginal US to further characterize the mass. Will obtain CA 125 and CEA as tumor markers. Will consult urology for their opinion and possible stent placement if needed.   History of substance abuse on methadone: Will restart patient on methadone.  Type I Diabetic: Will monitor blood glucose every 4 hours. Will place patient on a sliding scale and continue her long acting insulin.     Update: Patient will be going to the OR today for ureteral stenting with Dr. Gloriann Loan from Urology.   Rawlin Reaume R Zakhi Dupre 06/06/2017, 2:18 PM  [facility], [facility].

## 2017-06-06 NOTE — ED Notes (Signed)
Pt asked "can I take my methadone". Told pt to not take home meds until MD cleared. Pt agreed. Pt denies pain relief s/p IV medication.

## 2017-06-06 NOTE — ED Notes (Signed)
Ambulated to restroom independently  

## 2017-06-06 NOTE — Progress Notes (Signed)
CBG of 216. Dr. Gilman Schmidt notified and verbal order received to give Scheduled Lantus. Pt. Is sleeping but easily awakened and she denies c/o. Will follow closely.

## 2017-06-06 NOTE — Care Management (Signed)
Pt states she is very sensitive to insulin. 3 u can bring sugars down quite drastically. The last BS was in the 300 range. Because of her history, I will wait until post op to decide on more insulin.

## 2017-06-06 NOTE — Op Note (Signed)
Operative Note  Preoperative diagnosis:  1.  Right hydronephrosis secondary to extrinsic compression of a right lower quadrant complex cystic mass  Post operative diagnosis: 1. Right hydronephrosis secondary to extrinsic compression of a right lower quadrant complex cystic mass   Procedure(s): 1.  Cystoscopy with right retrograde pyelogram and right ureteral stent placement  Surgeon: Link Snuffer, MD  Assistants: None  Anesthesia: General  Complications: None immediate  EBL: Minimal  Specimens: 1.  None  Drains/Catheters: 1.  6 X 26 double-J ureteral stent  Intraoperative findings: 1.  Normal urethra and bladder except for some cystitis cystica throughout the bladder but no obvious concerning masses.  2.  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  During that excision, there was rupture of the ovarian cyst containing the cystadenoma.  Patient presented this morning with sharp right-sided flank pain.  It awoke her from sleep as he also had an episode of emesis.  Denies hematuria or dysuria.  Her pain improved with pain medication in the emergency department.  Her creatinine is elevated at 1.56 with no leukocytosis.  She does have a history of substance abuse and takes methadone for this.  Urinalysis negative except for few bacteria, 6-30 RBC per high-power field.  After discussion of different options for the hydronephrosis, she elected to undergo ureteral stent placement.    Description of procedure:  The patient was identified and consent was obtained.  The patient was taken to the operating room and placed in the supine position.  The patient was placed under general  anesthesia.  Perioperative antibiotics were administered.  The patient was placed in dorsal lithotomy.  Patient was prepped and draped in a standard sterile fashion and a timeout was performed.  A 21 French rigid cystoscope was advanced into the urethra and into the bladder.  The right distal most portion of the ureter was cannulated with an open-ended ureteral catheter.  Retrograde pyelogram was performed with the findings noted above.  A sensor wire was then advanced up to the kidney under fluoroscopic guidance.  A 6 X 24 double-J ureteral stent was advanced up to the kidney under fluoroscopic guidance.  The wire was withdrawn and fluoroscopy revealed that the stent barely reach the renal pelvis and in fact it migrated down into the proximal most ureter.  I therefore used a grasper to pull the stent just beyond the urethral meatus and advanced a sensor wire through the stent and up into the kidney followed by removal of the stent.  I then backloaded the wire onto the cystoscope and advanced it into the bladder.  I then advanced a 6 x 26 double-J ureteral stent to the kidney in a standard fashion followed by removal of the wire.  Fluoroscopy confirmed good proximal placement and direct visualization confirmed a good coil within the bladder.  The bladder was drained and the scope withdrawn.  This concluded the operation.  Patient tolerated procedure well and was stable postoperatively.  Plan: The patient will need to have full evaluation of the complex cystic mass in the right lower quadrant.  It will likely require removal in order to relieve the extrinsic compression on the ureter.  Stable for discharge from a urological standpoint.  She can follow-up outpatient with [place].

## 2017-06-06 NOTE — Transfer of Care (Signed)
Immediate Anesthesia Transfer of Care Note  Patient: Nichole Wiley  Procedure(s) Performed: Procedure(s): CYSTOSCOPY WITH RETROGRADE PYELOGRAM/URETERAL STENT PLACEMENT (Right)  Patient Location: PACU  Anesthesia Type:General  Level of Consciousness: sedated  Airway & Oxygen Therapy: Patient Spontanous Breathing and Patient connected to face mask oxygen  Post-op Assessment: Report given to RN and Post -op Vital signs reviewed and stable  Post vital signs: Reviewed and stable  Last Vitals:  Vitals:   06/06/17 1635 06/06/17 1827  BP: 134/82 95/66  Pulse: 92 88  Resp: 18 11  Temp: 37 C (!) 36.4 C  SpO2: 54% 883%    Complications: No apparent anesthesia complications

## 2017-06-07 ENCOUNTER — Encounter: Payer: Self-pay | Admitting: Urology

## 2017-06-07 LAB — BASIC METABOLIC PANEL
ANION GAP: 7 (ref 5–15)
BUN: 24 mg/dL — ABNORMAL HIGH (ref 6–20)
CALCIUM: 8.9 mg/dL (ref 8.9–10.3)
CO2: 23 mmol/L (ref 22–32)
Chloride: 104 mmol/L (ref 101–111)
Creatinine, Ser: 1.29 mg/dL — ABNORMAL HIGH (ref 0.44–1.00)
GFR calc Af Amer: >60 mL/min (ref >60)
GFR, EST NON AFRICAN AMERICAN: 52 mL/min — AB (ref >60)
GLUCOSE: 229 mg/dL — AB (ref 65–99)
Potassium: 4.4 mmol/L (ref 3.5–5.1)
SODIUM: 134 mmol/L — AB (ref 135–145)

## 2017-06-07 LAB — CBC
HEMATOCRIT: 31.2 % — AB (ref 35.0–47.0)
Hemoglobin: 10.5 g/dL — ABNORMAL LOW (ref 12.0–16.0)
MCH: 28.9 pg (ref 26.0–34.0)
MCHC: 33.7 g/dL (ref 32.0–36.0)
MCV: 85.7 fL (ref 80.0–100.0)
Platelets: 118 10*3/uL — ABNORMAL LOW (ref 150–440)
RBC: 3.64 MIL/uL — ABNORMAL LOW (ref 3.80–5.20)
RDW: 14 % (ref 11.5–14.5)
WBC: 9.9 10*3/uL (ref 3.6–11.0)

## 2017-06-07 LAB — HIV ANTIBODY (ROUTINE TESTING W REFLEX): HIV Screen 4th Generation wRfx: NONREACTIVE

## 2017-06-07 LAB — CA 125: Cancer Antigen (CA) 125: 13.7 U/mL (ref 0.0–38.1)

## 2017-06-07 LAB — GLUCOSE, CAPILLARY
GLUCOSE-CAPILLARY: 191 mg/dL — AB (ref 65–99)
Glucose-Capillary: 207 mg/dL — ABNORMAL HIGH (ref 65–99)

## 2017-06-07 LAB — CEA: CEA1: 3.7 ng/mL (ref 0.0–4.7)

## 2017-06-07 LAB — FOLLICLE STIMULATING HORMONE: FSH: 6.4 m[IU]/mL

## 2017-06-07 MED ORDER — PHENAZOPYRIDINE HCL 100 MG PO TABS
100.0000 mg | ORAL_TABLET | Freq: Three times a day (TID) | ORAL | Status: DC
  Filled 2017-06-07: qty 1

## 2017-06-07 MED ORDER — PHENAZOPYRIDINE HCL 100 MG PO TABS: 100.0000 mg | ORAL_TABLET | Freq: Three times a day (TID) | ORAL | 1 refills | Status: DC | PRN

## 2017-06-07 MED ORDER — PHENAZOPYRIDINE HCL 100 MG PO TABS
100.0000 mg | ORAL_TABLET | Freq: Three times a day (TID) | ORAL | Status: DC
Start: 2017-06-07 — End: 2017-06-07
  Administered 2017-06-07: 100 mg via ORAL
  Filled 2017-06-07 (×2): qty 1

## 2017-06-07 MED ORDER — OXYBUTYNIN CHLORIDE ER 5 MG PO TB24: 5.0000 mg | ORAL_TABLET | Freq: Every day | ORAL | 0 refills | Status: DC

## 2017-06-07 MED ORDER — ACETAMINOPHEN 500 MG PO TABS
500.0000 mg | ORAL_TABLET | Freq: Four times a day (QID) | ORAL | Status: DC | PRN
  Administered 2017-06-07 (×2): 500 mg via ORAL
  Filled 2017-06-07 (×2): qty 1

## 2017-06-07 NOTE — Progress Notes (Addendum)
Pt discharged to home with family. Will pick up RX's and schedule follow up appointments. D/C instructions reviewed with patient who verbalized understanding.

## 2017-06-07 NOTE — Progress Notes (Signed)
Pt. Refused Insulin dose of 2 Units at this time for CBG of 191. Pt. States," if I go any lwer, I will get the shakes and I do not want anymore."

## 2017-06-07 NOTE — Plan of Care (Signed)
Pt. Post-Op Ureteral Stint placement. VSS Alert and oriented with cheerful affect. Color good, skin w&d. BBS clear. Will do I.S. With encouragement. Assisted up to void and voided 200cc of dark red urine. Appears to have relief of "dull" pain in left lower abdomen after P.O. Tylenol as evidenced by sleep. CBG's elevated and treated as per Sliding Scale order. Pt. Has tolerated crackers and fluids Post-Op.

## 2017-06-07 NOTE — Discharge Instructions (Signed)

## 2017-06-07 NOTE — Discharge Summary (Signed)
Physician Discharge Summary  Patient ID: Nichole Wiley MRN: [id number] DOB/AGE: 1980/08/26 37 y.o.  Admit date: 06/06/2017 Discharge date: 06/07/2017  Admission Diagnoses:  Discharge Diagnoses:  Active Problems:   Hydronephrosis   Pelvic adhesive disease   Discharged Condition: good  Hospital Course: Patient was admitted for flank pain and pelvic cystic mass. A ureteral stent was placed in right ureter. Patient has an uncomplicated postoperative course. She is feeling much better. She desires discharge today because she struggles with depression if she remains too long in the hospital. Her pelvic mass can be followed up closely outpatient.   Consults: None  Significant Diagnostic Studies: CT Scan  Treatments: IV hydration and surgery: Right ureteral stent placement  Discharge Exam: Blood pressure 130/77, pulse 71, temperature 98.5 F (36.9 C), temperature source Oral, resp. rate 16, height 5\' 6"  (1.676 m), weight 170 lb (77.1 kg), SpO2 100 %. General appearance: alert, cooperative and appears stated age Eyes: conjunctivae/corneas clear. PERRL, EOM's intact. Fundi benign. Resp: clear to auscultation bilaterally Cardio: regular rate and rhythm, S1, S2 normal, no murmur, click, rub or gallop GI: soft, non-tender; bowel sounds normal; no masses,  no organomegaly Extremities: extremities normal, atraumatic, no cyanosis or edema Skin: Skin color, texture, turgor normal. No rashes or lesions Neurologic: Alert and oriented X 3, normal strength and tone. Normal symmetric reflexes. Normal coordination and gait  Disposition: Discharge disposition: 01-Home or Self Care       Discharge Instructions    (HEART FAILURE PATIENTS) Call MD:  Anytime you have any of the following symptoms: 1) 3 pound weight gain in 24 hours or 5 pounds in 1 week 2) shortness of breath, with or without a dry hacking cough 3) swelling in the hands, feet or stomach 4) if you have to sleep on extra pillows at  night in order to breathe.   Complete by:  As directed    Call MD for:  difficulty breathing, headache or visual disturbances   Complete by:  As directed    Call MD for:  extreme fatigue   Complete by:  As directed    Call MD for:  hives   Complete by:  As directed    Call MD for:  persistant dizziness or light-headedness   Complete by:  As directed    Call MD for:  persistant nausea and vomiting   Complete by:  As directed    Call MD for:  redness, tenderness, or signs of infection (pain, swelling, redness, odor or green/yellow discharge around incision site)   Complete by:  As directed    Call MD for:  severe uncontrolled pain   Complete by:  As directed    Call MD for:  temperature >100.4   Complete by:  As directed    Diet - low sodium heart healthy   Complete by:  As directed    Increase activity slowly   Complete by:  As directed      Allergies as of 06/07/2017      Reactions   Cephalexin Other (See Comments), Rash   Unknown   Penicillins Rash   Has patient had a PCN reaction causing immediate rash, facial/tongue/throat swelling, SOB or lightheadedness with hypotension: No Has patient had a PCN reaction causing severe rash involving mucus membranes or skin necrosis: No Has patient had a PCN reaction that required hospitalization: No Has patient had a PCN reaction occurring within the last 10 years: No If all of the above answers are "NO", then may  proceed with Cephalosporin use.      Medication List    STOP taking these medications   bisacodyl 5 MG EC tablet Commonly known as:  DULCOLAX   furosemide 20 MG tablet Commonly known as:  LASIX   hyoscyamine 0.125 MG SL tablet Commonly known as:  LEVSIN SL   omeprazole 40 MG capsule Commonly known as:  PRILOSEC   ondansetron 4 MG tablet Commonly known as:  ZOFRAN   polyethylene glycol powder powder Commonly known as:  GLYCOLAX/MIRALAX   promethazine 25 MG tablet Commonly known as:  PHENERGAN     TAKE these  medications   insulin aspart 100 UNIT/ML injection Commonly known as:  novoLOG Inject 0-4 Units into the skin 3 (three) times daily with meals.   LANTUS SOLOSTAR 100 UNIT/ML Solostar Pen Generic drug:  Insulin Glargine Inject 42 Units into the skin at bedtime. What changed:  Another medication with the same name was removed. Continue taking this medication, and follow the directions you see here.   methadone 10 MG/ML solution Commonly known as:  DOLOPHINE Take 170 mg by mouth daily.   oxybutynin 5 MG 24 hr tablet Commonly known as:  DITROPAN XL Take 1 tablet (5 mg total) by mouth at bedtime.   phenazopyridine 100 MG tablet Commonly known as:  PYRIDIUM Take 1 tablet (100 mg total) by mouth 3 (three) times daily as needed for pain.      Follow-up Information    Gae Dry, MD. Call in 1 week(s).   Specialty:  Obstetrics and Gynecology Why:  Please call to make an appointment Contact information: [place] [address] [phone]        Lucas Mallow, MD. Call in 3 week(s).   Specialty:  Urology Contact information: [place] [address] [phone]           Signed: Homero Fellers 06/07/2017, 9:08 AM

## 2017-06-07 NOTE — Progress Notes (Signed)
Dr. Gloriann Loan notified of U/O of 300cc this shift. Pt. Had 200cc between 2000 and 0000. She was assisted up to void at 0521 and voided 100cc. Order to Bladder Scan Pt. And insert Foley Catheter if >400.

## 2017-06-07 NOTE — Progress Notes (Signed)
Bladder Scanned as per Dr. Purvis Sheffield order. 227cc Urine in Bladder per Scan.

## 2017-06-07 NOTE — Progress Notes (Signed)
Patient ID: Nichole Wiley, female   DOB: 12/01/1980, 37 y.o.   MRN: [id number]  CHIEF COMPLAINT:   Chief Complaint  Patient presents with  . Flank Pain    Subjective  Patient reports that her flank pain has greatly improved. She is tolerating a regular diet. She reports that when she urinates she feel like plastic is cutting her. It lasts for 10 minutes then goes away. She is otherwise doing well. Resting in bed watching a movie on her ipad comfortably.   Objective   Examination:  General exam: Appears calm and comfortable  Respiratory system: Clear to auscultation. Respiratory effort normal. HEENT: [place]/AT, PERRLA, no thrush, no stridor. Cardiovascular system: S1 & S2 heard, RRR. No JVD, murmurs, rubs, gallops or clicks. No pedal edema. Gastrointestinal system: Abdomen is nondistended, soft and nontender. No organomegaly or masses felt. Normal bowel sounds heard. Central nervous system: Alert and oriented. No focal neurological deficits. Extremities: Symmetric 5 x 5 power. Skin: No rashes, lesions or ulcers Psychiatry: Judgement and insight appear normal. Mood & affect appropriate.   VITALS:  height is 5\' 6"  (1.676 m) and weight is 170 lb (77.1 kg). Her oral temperature is 98.5 F (36.9 C). Her blood pressure is 130/77 and her pulse is 71. Her respiration is 16 and oxygen saturation is 100%.   I personally reviewed Labs under Results section.  Radiology Reports US Pelvis Complete  Result Date: 06/06/2017 CLINICAL DATA:  37 year old female with RIGHT pelvic cystic mass obstructing the distal RIGHT ureter identified on CT today. History of hysterectomy. EXAM: TRANSABDOMINAL ULTRASOUND OF PELVIS DOPPLER ULTRASOUND OF OVARIES TECHNIQUE: Transabdominal ultrasound examination of the pelvis was performed including evaluation of the uterus, ovaries, adnexal regions, and pelvic cul-de-sac. Color and duplex Doppler ultrasound was utilized to evaluate blood flow to the ovaries. COMPARISON:   06/06/2017 CT FINDINGS: Uterus Not visualized compatible with hysterectomy. Right ovary Measurements: 3.8 x 2.6 x 3.9 cm. A 2.7 x 1.8 x 2.6 cm complex cyst within the RIGHT ovary noted and appears to obstruct the distal RIGHT ureter. Left ovary Measurements: 3.3 x 2.2 x 3.5 cm. Normal appearance/no adnexal mass. Pulsed Doppler evaluation demonstrates normal low-resistance arterial and venous waveforms in both ovaries. Other: No free fluid. IMPRESSION: 1. 2.7 cm complex RIGHT ovarian cyst which appears to obstruct the distal RIGHT ureter. This cyst is of uncertain etiology. 2. No evidence of ovarian torsion bilaterally. 3. Normal LEFT ovary. 4. Status post hysterectomy. Electronically Signed   By: Margarette Canada M.D.   On: 06/06/2017 14:34   Ct Abdomen Pelvis W Contrast  Result Date: 06/06/2017 CLINICAL DATA:  37 year old female with acute RIGHT abdominal and pelvic pain. History of hysterectomy and RIGHT oophorectomy in 2012. EXAM: CT ABDOMEN AND PELVIS WITH CONTRAST TECHNIQUE: Multidetector CT imaging of the abdomen and pelvis was performed using the standard protocol following bolus administration of intravenous contrast. CONTRAST:  81mL ISOVUE-300 IOPAMIDOL (ISOVUE-300) INJECTION 61% COMPARISON:  07/15/2012 CT FINDINGS: Lower chest: No acute abnormality Hepatobiliary: The liver and gallbladder are unremarkable. No biliary dilatation. Pancreas: Unremarkable Spleen: Unremarkable Adrenals/Urinary Tract: There is severe RIGHT hydroureteronephrosis to the distal RIGHT ureter. A 3 x 3.5 cm cystic structure within the RIGHT pelvis at the level of obstruction is noted (2:72). The very distal RIGHT ureter is collapsed. RIGHT perinephric and periureteral inflammation is noted. The LEFT kidney, adrenal glands and bladder are unremarkable. Stomach/Bowel: Stomach is within normal limits. Appendix appears normal. No evidence of bowel wall thickening, distention, or inflammatory changes. Vascular/Lymphatic: Aortic  atherosclerosis. No enlarged abdominal or pelvic lymph nodes. Reproductive: Status post hysterectomy. The LEFT adnexal region is unremarkable. The 3 x 3.5 cm cystic structure in the RIGHT adnexal region obstructing the distal RIGHT ureter as discussed above could represent an ovarian cyst as there appears to be a small amount of RIGHT ovarian tissue on the 2014 CT, after the reported RIGHT oophorectomy in 2012. Other: No ascites, pneumoperitoneum or abdominal wall hernia. Musculoskeletal: No acute or suspicious bony abnormalities. IMPRESSION: 1. Severe RIGHT hydroureteronephrosis to the distal RIGHT ureter, presumably caused by a 3 x 3.5 cm cystic structure in the RIGHT pelvis. This may represent a RIGHT ovarian cyst given that there appears to be some RIGHT ovarian tissue on the 2014 CT. 2.  Aortic Atherosclerosis (ICD10-I70.0). Electronically Signed   By: Margarette Canada M.D.   On: 06/06/2017 10:02   [place] Art/ven Flow Abd Pelv Doppler  Result Date: 06/06/2017 CLINICAL DATA:  37 year old female with RIGHT pelvic cystic mass obstructing the distal RIGHT ureter identified on CT today. History of hysterectomy. EXAM: TRANSABDOMINAL ULTRASOUND OF PELVIS DOPPLER ULTRASOUND OF OVARIES TECHNIQUE: Transabdominal ultrasound examination of the pelvis was performed including evaluation of the uterus, ovaries, adnexal regions, and pelvic cul-de-sac. Color and duplex Doppler ultrasound was utilized to evaluate blood flow to the ovaries. COMPARISON:  06/06/2017 CT FINDINGS: Uterus Not visualized compatible with hysterectomy. Right ovary Measurements: 3.8 x 2.6 x 3.9 cm. A 2.7 x 1.8 x 2.6 cm complex cyst within the RIGHT ovary noted and appears to obstruct the distal RIGHT ureter. Left ovary Measurements: 3.3 x 2.2 x 3.5 cm. Normal appearance/no adnexal mass. Pulsed Doppler evaluation demonstrates normal low-resistance arterial and venous waveforms in both ovaries. Other: No free fluid. IMPRESSION: 1. 2.7 cm complex RIGHT ovarian  cyst which appears to obstruct the distal RIGHT ureter. This cyst is of uncertain etiology. 2. No evidence of ovarian torsion bilaterally. 3. Normal LEFT ovary. 4. Status post hysterectomy. Electronically Signed   By: Margarette Canada M.D.   On: 06/06/2017 14:34      Assessment/Plan:  37 yo with cystic mass causing compression of the right ureter and hydronephrosis no relieved with ureteral stent placement. Discussed patient's symptoms of dysuria and they recommended ditropan 5mg  BID. Will discharge her home with plans for close follow up in office with Dr. Kenton Kingfisher .   Code Status: Full  Family Communication: None  Disposition Plan: Home  DVT Prophylaxis  Lovenox - Heparin - SCDs -TED hose in place  Time spent: 15 minutes   LOS: 1 day   [facility], [facility] 06/07/17 8:56 AM

## 2017-06-07 NOTE — Progress Notes (Signed)
Pt asked if she needs to be on restrictions for work after d/c. Spoke with Dr. Gilman Schmidt who stated pt should be able to resume normal activity as tolerated. Will plan to f/u with Dr. Gloriann Loan (urology).

## 2017-06-08 ENCOUNTER — Encounter (INDEPENDENT_AMBULATORY_CARE_PROVIDER_SITE_OTHER): Payer: Self-pay

## 2017-06-08 ENCOUNTER — Telehealth: Payer: Self-pay | Admitting: Urology

## 2017-06-08 ENCOUNTER — Telehealth: Payer: Self-pay | Admitting: Obstetrics & Gynecology

## 2017-06-08 LAB — GLUCOSE, CAPILLARY
GLUCOSE-CAPILLARY: 176 mg/dL — AB (ref 65–99)
Glucose-Capillary: 326 mg/dL — ABNORMAL HIGH (ref 65–99)
Glucose-Capillary: 339 mg/dL — ABNORMAL HIGH (ref 65–99)

## 2017-06-08 NOTE — Telephone Encounter (Signed)
-----   Message from Lucas Mallow, MD sent at 06/06/2017  6:26 PM EDT ----- Please arrange for an outpatient follow-up with a provider in about 2-3 weeks.  Thank you

## 2017-06-08 NOTE — Telephone Encounter (Signed)
-----   Message from Homero Fellers, MD sent at 06/07/2017  9:18 AM EDT ----- Regarding: Appointment Please call patient and schedule follow up for her with Dr. Kenton Kingfisher in office.  Thank you, Dr. Gilman Schmidt

## 2017-06-08 NOTE — Telephone Encounter (Signed)
Unable to reach patient voicemail box not set up.

## 2017-06-08 NOTE — Telephone Encounter (Signed)
Unable to reach patient voicemail box not set up

## 2017-06-08 NOTE — Telephone Encounter (Signed)
App made and mailed to patient could not reach no VM Mailed  06-08-17  MB

## 2017-06-12 ENCOUNTER — Encounter: Payer: Self-pay | Admitting: Obstetrics & Gynecology

## 2017-06-12 ENCOUNTER — Ambulatory Visit (INDEPENDENT_AMBULATORY_CARE_PROVIDER_SITE_OTHER): Payer: Medicaid Other | Admitting: Obstetrics & Gynecology

## 2017-06-12 VITALS — BP 110/60 | HR 81 | Ht 67.0 in | Wt 171.0 lb

## 2017-06-12 DIAGNOSIS — R109 Unspecified abdominal pain: Secondary | ICD-10-CM | POA: Insufficient documentation

## 2017-06-12 DIAGNOSIS — N736 Female pelvic peritoneal adhesions (postinfective): Secondary | ICD-10-CM

## 2017-06-12 DIAGNOSIS — N83201 Unspecified ovarian cyst, right side: Secondary | ICD-10-CM | POA: Diagnosis not present

## 2017-06-12 NOTE — Progress Notes (Signed)
Gynecology Pelvic Pain Evaluation   Chief Complaint: Pain on right side  History of Present Illness:   Patient is a 37 y.o. [id number] who LMP was years ago (hysterectomy2005), presents today for a problem visit.  She complains of pain.   Her pain is localized to the right flank and side that began acutely 7 days ago; seen in hospital and found to have copressive effect oif a cystic mass on her right ureter; treated with a ureteral stent with improvement. Its severity is described as mild now, but was severe when it happened last Saturday.. The pain radiates to the  right back. She has these associated symptoms which include none. Patient has these modifiers which include stent; meds that make it better and unable to associate with any factor that make it worse.  Context includes: Pt had Hysterectomy 2005.  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  Known to have significant Adhesive Disease.  She has done well since that time.  Previous evaluation: ER over weekend. Prior Diagnosis: 2.7 cm cystic lesion in Right Pelvis overlying ureter, on CT and pelvic ultrasound. Previous Treatment: no additional treatemtns or surgeries on this cyst; just stent placement to help with hydroureter and obstruction.  PMHx: She  has a past medical history of Arthritis, Colon polyp, Diabetes 1.5, managed as type 1 ([place]), Drug addiction ([place]), GERD (gastroesophageal reflux disease), Pelvic floor dysfunction, and Urine incontinence. Also,  has a past surgical history that includes Tonsillectomy (1987); Cesarean section (2003); Tubal ligation (2004); Abdominal hysterectomy (2005); Right oophorectomy (2012); laparoscopy; Lysis of adhesion; Colonoscopy with propofol (N/A, 09/14/2015); polypectomy (09/14/2015); Esophagogastroduodenoscopy (egd) with propofol (N/A, 06/19/2016); Vulvectomy (2011); and Cystoscopy w/ ureteral stent placement (Right, 06/06/2017)., family  history includes Alcohol abuse in her father; Dementia in her maternal grandmother; Depression in her maternal grandmother and mother; Diabetes in her father; Heart disease in her maternal grandmother.,  reports that she has been smoking cigarettes.  She has a 24.00 pack-year smoking history. She has never used smokeless tobacco. She reports that she does not drink alcohol or use drugs.  She has a current medication list which includes the following prescription(s): insulin aspart, insulin glargine, methadone, oxybutynin, and phenazopyridine. Also, is allergic to cephalexin and penicillins.  Review of Systems  Constitutional: Negative for chills, fever and malaise/fatigue.  HENT: Negative for congestion, sinus pain and sore throat.   Eyes: Negative for blurred vision and pain.  Respiratory: Negative for cough and wheezing.   Cardiovascular: Negative for chest pain and leg swelling.  Gastrointestinal: Negative for abdominal pain, constipation, diarrhea, heartburn, nausea and vomiting.  Genitourinary: Negative for dysuria, frequency, hematuria and urgency.  Musculoskeletal: Negative for back pain, joint pain, myalgias and neck pain.  Skin: Negative for itching and rash.  Neurological: Negative for dizziness, tremors and weakness.  Endo/Heme/Allergies: Does not bruise/bleed easily.  Psychiatric/Behavioral: Negative for depression. The patient is not nervous/anxious and does not have insomnia.     Objective: BP 110/60   Pulse 81   Ht 5\' 7"  (1.702 m)   Wt 171 lb (77.6 kg)   BMI 26.78 kg/m  Physical Exam  Constitutional: She is oriented to person, place, and time. She appears well-developed and well-nourished. No distress.  Musculoskeletal: Normal range of motion.  Neurological: She is alert and oriented to person, place, and time.  Skin: Skin is warm and dry.  Psychiatric: She has a normal mood and affect.  Vitals reviewed.  Assessment: 37 y.o. [id number] with PAIN, especially on right side  and flank; also has cystic mass as diagnosed by CT/US in right pelvis overlying ureter where prior RSO had been done..  1. Right ovarian cyst - Ovarian Remnant vs Adhesive disease fluid loculation vs other etiology  2. Pelvic adhesive disease - Concern for degree of difficulty with surgery to explore and excise mass sitting on ureter - Ambulatory referral to Minimally Invasive Gynecology Specialist discussed w patient  3. Right sided abdominal pain - Cont heat, rest, pain meds, and maintain stent until more evaluations and treatment options discussed  A total of 20 minutes were spent face-to-face with the patient during this encounter and over half of that time dealt with counseling and coordination of care.   Barnett Applebaum, MD, [facility], [facility] 06/12/2017  11:44 AM

## 2017-06-25 ENCOUNTER — Encounter: Payer: Self-pay | Admitting: Urology

## 2017-06-25 ENCOUNTER — Other Ambulatory Visit: Payer: Self-pay | Admitting: Radiology

## 2017-06-25 ENCOUNTER — Ambulatory Visit (INDEPENDENT_AMBULATORY_CARE_PROVIDER_SITE_OTHER): Payer: Medicaid Other | Admitting: Urology

## 2017-06-25 ENCOUNTER — Telehealth: Payer: Self-pay | Admitting: Radiology

## 2017-06-25 VITALS — BP 116/71 | HR 87 | Resp 16 | Ht 67.0 in | Wt 166.4 lb

## 2017-06-25 DIAGNOSIS — N1330 Unspecified hydronephrosis: Secondary | ICD-10-CM

## 2017-06-25 LAB — URINALYSIS, COMPLETE
Bilirubin, UA: NEGATIVE
Glucose, UA: NEGATIVE
Ketones, UA: NEGATIVE
Nitrite, UA: NEGATIVE
Specific Gravity, UA: 1.02 (ref 1.005–1.030)
Urobilinogen, Ur: 0.2 mg/dL (ref 0.2–1.0)
pH, UA: 6.5 (ref 5.0–7.5)

## 2017-06-25 LAB — MICROSCOPIC EXAMINATION

## 2017-06-25 NOTE — Telephone Encounter (Signed)
Contacted Izora Gala with [facility] regarding referral for pelvic mass by Dr Kenton Kingfisher. Pt has not been contacted for appt yet. Pt's phone is broken. May call mother, Holley Raring, at [phone] to set up the appointment.

## 2017-06-25 NOTE — H&P (View-Only) (Signed)
06/25/2017 10:44 AM   Nichole Wiley 1980/09/11 [id number]  Referring provider: Casilda Carls, MD No address on file  Chief Complaint  Patient presents with  . Follow-up    stent     HPI: 37 year old female in the office today after recent cystoscopy, right ureteral stent placement by Dr. Gloriann Loan on 06/06/2017.  She presented to the emergency room with right flank pain found to have right hydronephrosis secondary to a compressive 2.7 cm cystic lesion within the pelvis.  She underwent ureteral stent placement and her pain improved and was ultimately discharged.  She returns today for discussion on stent management.  She is been tolerating the stent fairly well.   She does have some mild frequency and urgency related to the stent, otherwise no complaints.  She has a complex GYN history including history of hysterectomy in 2005.  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  She recently had follow-up with Dr. Kenton Kingfisher several weeks ago and was referred to tertiary care OB/GYN.  She is not heard anything about this appointment.  Her telephone is been broken she is unable to receive messages.   PMH: Past Medical History:  Diagnosis Date  . Arthritis    back  . Colon polyp   . Diabetes 1.5, managed as type 1 ([place])   . Drug addiction ([place])    hx (pain pills after c-sect)  . GERD (gastroesophageal reflux disease)   . Pelvic floor dysfunction   . Urine incontinence     Surgical History: Past Surgical History:  Procedure Laterality Date  . ABDOMINAL HYSTERECTOMY  2005  . CESAREAN SECTION  2003  . COLONOSCOPY WITH PROPOFOL N/A 09/14/2015   Procedure: COLONOSCOPY WITH PROPOFOL;  Surgeon: Lucilla Lame, MD;  Location: [facility];  Service: Endoscopy;  Laterality: N/A;  Diabetic - insulin  . CYSTOSCOPY W/ URETERAL STENT PLACEMENT Right 06/06/2017   Procedure: CYSTOSCOPY WITH  RETROGRADE PYELOGRAM/URETERAL STENT PLACEMENT;  Surgeon: Lucas Mallow, MD;  Location: [facility];  Service: Urology;  Laterality: Right;  . ESOPHAGOGASTRODUODENOSCOPY (EGD) WITH PROPOFOL N/A 06/19/2016   Procedure: ESOPHAGOGASTRODUODENOSCOPY (EGD) WITH PROPOFOL;  Surgeon: Jonathon Bellows, MD;  Location: [facility];  Service: Endoscopy;  Laterality: N/A;  . LAPAROSCOPY    . LYSIS OF ADHESION    . POLYPECTOMY  09/14/2015   Procedure: POLYPECTOMY;  Surgeon: Lucilla Lame, MD;  Location: [facility];  Service: Endoscopy;;  . RIGHT OOPHORECTOMY  2012  . TONSILLECTOMY  1987   and adenoids  . TUBAL LIGATION  2004  . VULVECTOMY  2011   lipoma    Home Medications:  Allergies as of 06/25/2017      Reactions   Cephalexin Other (See Comments), Rash   Unknown   Penicillins Rash   Has patient had a PCN reaction causing immediate rash, facial/tongue/throat swelling, SOB or lightheadedness with hypotension: No Has patient had a PCN reaction causing severe rash involving mucus membranes or skin necrosis: No Has patient had a PCN reaction that required hospitalization: No Has patient had a PCN reaction occurring within the last 10 years: No If all of the above answers are "NO", then may proceed with Cephalosporin use.      Medication List        Accurate as of 06/25/17 10:44 AM. Always use your most recent med list.          insulin aspart 100 UNIT/ML  injection Commonly known as:  novoLOG Inject 0-4 Units into the skin 3 (three) times daily with meals.   LANTUS SOLOSTAR 100 UNIT/ML Solostar Pen Generic drug:  Insulin Glargine Inject 42 Units into the skin at bedtime.   methadone 10 MG/ML solution Commonly known as:  DOLOPHINE Take 170 mg by mouth daily.   oxybutynin 5 MG 24 hr tablet Commonly known as:  DITROPAN XL Take 1 tablet (5 mg total) by mouth at bedtime.   phenazopyridine 100 MG tablet Commonly known as:  PYRIDIUM Take 1 tablet (100 mg total) by mouth 3 (three) times  daily as needed for pain.       Allergies:  Allergies  Allergen Reactions  . Cephalexin Other (See Comments) and Rash    Unknown  . Penicillins Rash    Has patient had a PCN reaction causing immediate rash, facial/tongue/throat swelling, SOB or lightheadedness with hypotension: No Has patient had a PCN reaction causing severe rash involving mucus membranes or skin necrosis: No Has patient had a PCN reaction that required hospitalization: No Has patient had a PCN reaction occurring within the last 10 years: No If all of the above answers are "NO", then may proceed with Cephalosporin use.    Family History: Family History  Problem Relation Age of Onset  . Depression Mother   . Diabetes Father   . Alcohol abuse Father   . Heart disease Maternal Grandmother   . Depression Maternal Grandmother   . Dementia Maternal Grandmother     Social History:  reports that she has been smoking cigarettes.  She has a 24.00 pack-year smoking history. She has never used smokeless tobacco. She reports that she does not drink alcohol or use drugs.  ROS: UROLOGY Frequent Urination?: No Hard to postpone urination?: Yes Burning/pain with urination?: No Get up at night to urinate?: No Leakage of urine?: No Urine stream starts and stops?: No Trouble starting stream?: No Do you have to strain to urinate?: No Blood in urine?: No Urinary tract infection?: No Sexually transmitted disease?: No Injury to kidneys or bladder?: Yes Painful intercourse?: Yes Weak stream?: No Currently pregnant?: No Vaginal bleeding?: No Last menstrual period?: n  Gastrointestinal Nausea?: No Vomiting?: No Indigestion/heartburn?: No Diarrhea?: No Constipation?: No  Constitutional Fever: No Night sweats?: No Weight loss?: No Fatigue?: Yes  Skin Skin rash/lesions?: No Itching?: No  Eyes Blurred vision?: No Double vision?: No  Ears/Nose/Throat Sore throat?: No Sinus problems?:  No  Hematologic/Lymphatic Swollen glands?: No Easy bruising?: No  Cardiovascular Leg swelling?: Yes Chest pain?: No  Respiratory Cough?: No Shortness of breath?: No  Endocrine Excessive thirst?: No  Musculoskeletal Back pain?: Yes Joint pain?: No  Neurological Headaches?: No Dizziness?: No  Psychologic Depression?: No Anxiety?: No  Physical Exam: BP 116/71   Pulse 87   Resp 16   Ht 5\' 7"  (1.702 m)   Wt 166 lb 6.4 oz (75.5 kg)   SpO2 98%   BMI 26.06 kg/m   Constitutional:  Alert and oriented, No acute distress. HEENT: [place] AT, moist mucus membranes.  Trachea midline, no masses. Cardiovascular: No clubbing, cyanosis, or edema. Respiratory: Normal respiratory effort, no increased work of breathing. GI: Abdomen is soft, nontender, nondistended, no abdominal masses GU: No CVA tenderness. Skin: No rashes, bruises or suspicious lesions. Neurologic: Grossly intact, no focal deficits, moving all 4 extremities. Psychiatric: Normal mood and affect.  Laboratory Data: Lab Results  Component Value Date   WBC 9.9 06/07/2017   HGB 10.5 (L) 06/07/2017  HCT 31.2 (L) 06/07/2017   MCV 85.7 06/07/2017   PLT 118 (L) 06/07/2017    Lab Results  Component Value Date   CREATININE 1.29 (H) 06/07/2017   Urinalysis Results for orders placed or performed in visit on 06/25/17  CULTURE, URINE COMPREHENSIVE  Result Value Ref Range   Urine Culture, Comprehensive Final report    Organism ID, Bacteria Comment   Microscopic Examination  Result Value Ref Range   WBC, UA 11-30 (A) 0 - 5 /hpf   RBC, UA 3-10 (A) 0 - 2 /hpf   Epithelial Cells (non renal) 0-10 0 - 10 /hpf   Casts Present (A) None seen /lpf   Cast Type Hyaline casts N/A   Mucus, UA Present (A) Not Estab.   Bacteria, UA Moderate (A) None seen/Few  Urinalysis, Complete  Result Value Ref Range   Specific Gravity, UA 1.020 1.005 - 1.030   pH, UA 6.5 5.0 - 7.5   Color, UA Yellow Yellow   Appearance Ur Cloudy (A) Clear    Leukocytes, UA 1+ (A) Negative   Protein, UA 2+ (A) Negative/Trace   Glucose, UA Negative Negative   Ketones, UA Negative Negative   RBC, UA Trace (A) Negative   Bilirubin, UA Negative Negative   Urobilinogen, Ur 0.2 0.2 - 1.0 mg/dL   Nitrite, UA Negative Negative   Microscopic Examination See below:     Pertinent Imaging: CLINICAL DATA:  37 year old female with acute RIGHT abdominal and pelvic pain. History of hysterectomy and RIGHT oophorectomy in 2012.  EXAM: CT ABDOMEN AND PELVIS WITH CONTRAST  TECHNIQUE: Multidetector CT imaging of the abdomen and pelvis was performed using the standard protocol following bolus administration of intravenous contrast.  CONTRAST:  57mL ISOVUE-300 IOPAMIDOL (ISOVUE-300) INJECTION 61%  COMPARISON:  07/15/2012 CT  FINDINGS: Lower chest: No acute abnormality  Hepatobiliary: The liver and gallbladder are unremarkable. No biliary dilatation.  Pancreas: Unremarkable  Spleen: Unremarkable  Adrenals/Urinary Tract: There is severe RIGHT hydroureteronephrosis to the distal RIGHT ureter. A 3 x 3.5 cm cystic structure within the RIGHT pelvis at the level of obstruction is noted (2:72). The very distal RIGHT ureter is collapsed. RIGHT perinephric and periureteral inflammation is noted.  The LEFT kidney, adrenal glands and bladder are unremarkable.  Stomach/Bowel: Stomach is within normal limits. Appendix appears normal. No evidence of bowel wall thickening, distention, or inflammatory changes.  Vascular/Lymphatic: Aortic atherosclerosis. No enlarged abdominal or pelvic lymph nodes.  Reproductive: Status post hysterectomy. The LEFT adnexal region is unremarkable. The 3 x 3.5 cm cystic structure in the RIGHT adnexal region obstructing the distal RIGHT ureter as discussed above could represent an ovarian cyst as there appears to be a small amount of RIGHT ovarian tissue on the 2014 CT, after the reported RIGHT oophorectomy  in 2012.  Other: No ascites, pneumoperitoneum or abdominal wall hernia.  Musculoskeletal: No acute or suspicious bony abnormalities.  IMPRESSION: 1. Severe RIGHT hydroureteronephrosis to the distal RIGHT ureter, presumably caused by a 3 x 3.5 cm cystic structure in the RIGHT pelvis. This may represent a RIGHT ovarian cyst given that there appears to be some RIGHT ovarian tissue on the 2014 CT. 2.  Aortic Atherosclerosis (ICD10-I70.0).   Electronically Signed   By: Margarette Canada M.D.   On: 06/06/2017 10:02  CT scan was personally reviewed today with the patient  Assessment & Plan:    1. Hydronephrosis, unspecified hydronephrosis type CT scan reviewed-appears to be secondary to extrarenal/extrinsic compression of the ureter either secondary to pelvic adhesions  or compressive cystic mass Patient has been referred to tertiary care center for OB/GYN evaluation of the lesion but is still awaiting consultation We will contact Dr. Mitzie Na office to ensure that she has this appropriate follow-up In terms of management of her ureteral stent, she will need some exchange in 1 month, Persistent pyelogram, and ureteral stent exchange with a long-term stent until she can have definitive surgical management She is agreeable with plan Risk and benefits of surgery were discussed in detail including risk of bleeding, infection, damage to other structures, and stent pain All questions Preop UA/urine culture - Urinalysis, Complete - CULTURE, URINE COMPREHENSIVE  Hollice Espy, MD  [facility] [address], [place], [place] [address] [phone]  I spent 25 min with this patient of which greater than 50% was spent in counseling and coordination of care with the patient.

## 2017-06-25 NOTE — Progress Notes (Signed)
06/25/2017 10:44 AM   Nichole Wiley Apr 04, 1980 [id number]  Referring provider: Casilda Carls, MD No address on file  Chief Complaint  Patient presents with  . Follow-up    stent     HPI: 37 year old female in the office today after recent cystoscopy, right ureteral stent placement by Dr. Gloriann Loan on 06/06/2017.  She presented to the emergency room with right flank pain found to have right hydronephrosis secondary to a compressive 2.7 cm cystic lesion within the pelvis.  She underwent ureteral stent placement and her pain improved and was ultimately discharged.  She returns today for discussion on stent management.  She is been tolerating the stent fairly well.   She does have some mild frequency and urgency related to the stent, otherwise no complaints.  She has a complex GYN history including history of hysterectomy in 2005.  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  She recently had follow-up with Dr. Kenton Kingfisher several weeks ago and was referred to tertiary care OB/GYN.  She is not heard anything about this appointment.  Her telephone is been broken she is unable to receive messages.   PMH: Past Medical History:  Diagnosis Date  . Arthritis    back  . Colon polyp   . Diabetes 1.5, managed as type 1 ([place])   . Drug addiction ([place])    hx (pain pills after c-sect)  . GERD (gastroesophageal reflux disease)   . Pelvic floor dysfunction   . Urine incontinence     Surgical History: Past Surgical History:  Procedure Laterality Date  . ABDOMINAL HYSTERECTOMY  2005  . CESAREAN SECTION  2003  . COLONOSCOPY WITH PROPOFOL N/A 09/14/2015   Procedure: COLONOSCOPY WITH PROPOFOL;  Surgeon: Lucilla Lame, MD;  Location: [facility];  Service: Endoscopy;  Laterality: N/A;  Diabetic - insulin  . CYSTOSCOPY W/ URETERAL STENT PLACEMENT Right 06/06/2017   Procedure: CYSTOSCOPY WITH  RETROGRADE PYELOGRAM/URETERAL STENT PLACEMENT;  Surgeon: Lucas Mallow, MD;  Location: [facility];  Service: Urology;  Laterality: Right;  . ESOPHAGOGASTRODUODENOSCOPY (EGD) WITH PROPOFOL N/A 06/19/2016   Procedure: ESOPHAGOGASTRODUODENOSCOPY (EGD) WITH PROPOFOL;  Surgeon: Jonathon Bellows, MD;  Location: [facility];  Service: Endoscopy;  Laterality: N/A;  . LAPAROSCOPY    . LYSIS OF ADHESION    . POLYPECTOMY  09/14/2015   Procedure: POLYPECTOMY;  Surgeon: Lucilla Lame, MD;  Location: [facility];  Service: Endoscopy;;  . RIGHT OOPHORECTOMY  2012  . TONSILLECTOMY  1987   and adenoids  . TUBAL LIGATION  2004  . VULVECTOMY  2011   lipoma    Home Medications:  Allergies as of 06/25/2017      Reactions   Cephalexin Other (See Comments), Rash   Unknown   Penicillins Rash   Has patient had a PCN reaction causing immediate rash, facial/tongue/throat swelling, SOB or lightheadedness with hypotension: No Has patient had a PCN reaction causing severe rash involving mucus membranes or skin necrosis: No Has patient had a PCN reaction that required hospitalization: No Has patient had a PCN reaction occurring within the last 10 years: No If all of the above answers are "NO", then may proceed with Cephalosporin use.      Medication List        Accurate as of 06/25/17 10:44 AM. Always use your most recent med list.          insulin aspart 100 UNIT/ML  injection Commonly known as:  novoLOG Inject 0-4 Units into the skin 3 (three) times daily with meals.   LANTUS SOLOSTAR 100 UNIT/ML Solostar Pen Generic drug:  Insulin Glargine Inject 42 Units into the skin at bedtime.   methadone 10 MG/ML solution Commonly known as:  DOLOPHINE Take 170 mg by mouth daily.   oxybutynin 5 MG 24 hr tablet Commonly known as:  DITROPAN XL Take 1 tablet (5 mg total) by mouth at bedtime.   phenazopyridine 100 MG tablet Commonly known as:  PYRIDIUM Take 1 tablet (100 mg total) by mouth 3 (three) times  daily as needed for pain.       Allergies:  Allergies  Allergen Reactions  . Cephalexin Other (See Comments) and Rash    Unknown  . Penicillins Rash    Has patient had a PCN reaction causing immediate rash, facial/tongue/throat swelling, SOB or lightheadedness with hypotension: No Has patient had a PCN reaction causing severe rash involving mucus membranes or skin necrosis: No Has patient had a PCN reaction that required hospitalization: No Has patient had a PCN reaction occurring within the last 10 years: No If all of the above answers are "NO", then may proceed with Cephalosporin use.    Family History: Family History  Problem Relation Age of Onset  . Depression Mother   . Diabetes Father   . Alcohol abuse Father   . Heart disease Maternal Grandmother   . Depression Maternal Grandmother   . Dementia Maternal Grandmother     Social History:  reports that she has been smoking cigarettes.  She has a 24.00 pack-year smoking history. She has never used smokeless tobacco. She reports that she does not drink alcohol or use drugs.  ROS: UROLOGY Frequent Urination?: No Hard to postpone urination?: Yes Burning/pain with urination?: No Get up at night to urinate?: No Leakage of urine?: No Urine stream starts and stops?: No Trouble starting stream?: No Do you have to strain to urinate?: No Blood in urine?: No Urinary tract infection?: No Sexually transmitted disease?: No Injury to kidneys or bladder?: Yes Painful intercourse?: Yes Weak stream?: No Currently pregnant?: No Vaginal bleeding?: No Last menstrual period?: n  Gastrointestinal Nausea?: No Vomiting?: No Indigestion/heartburn?: No Diarrhea?: No Constipation?: No  Constitutional Fever: No Night sweats?: No Weight loss?: No Fatigue?: Yes  Skin Skin rash/lesions?: No Itching?: No  Eyes Blurred vision?: No Double vision?: No  Ears/Nose/Throat Sore throat?: No Sinus problems?:  No  Hematologic/Lymphatic Swollen glands?: No Easy bruising?: No  Cardiovascular Leg swelling?: Yes Chest pain?: No  Respiratory Cough?: No Shortness of breath?: No  Endocrine Excessive thirst?: No  Musculoskeletal Back pain?: Yes Joint pain?: No  Neurological Headaches?: No Dizziness?: No  Psychologic Depression?: No Anxiety?: No  Physical Exam: BP 116/71   Pulse 87   Resp 16   Ht 5\' 7"  (1.702 m)   Wt 166 lb 6.4 oz (75.5 kg)   SpO2 98%   BMI 26.06 kg/m   Constitutional:  Alert and oriented, No acute distress. HEENT: [place] AT, moist mucus membranes.  Trachea midline, no masses. Cardiovascular: No clubbing, cyanosis, or edema. Respiratory: Normal respiratory effort, no increased work of breathing. GI: Abdomen is soft, nontender, nondistended, no abdominal masses GU: No CVA tenderness. Skin: No rashes, bruises or suspicious lesions. Neurologic: Grossly intact, no focal deficits, moving all 4 extremities. Psychiatric: Normal mood and affect.  Laboratory Data: Lab Results  Component Value Date   WBC 9.9 06/07/2017   HGB 10.5 (L) 06/07/2017  HCT 31.2 (L) 06/07/2017   MCV 85.7 06/07/2017   PLT 118 (L) 06/07/2017    Lab Results  Component Value Date   CREATININE 1.29 (H) 06/07/2017   Urinalysis Results for orders placed or performed in visit on 06/25/17  CULTURE, URINE COMPREHENSIVE  Result Value Ref Range   Urine Culture, Comprehensive Final report    Organism ID, Bacteria Comment   Microscopic Examination  Result Value Ref Range   WBC, UA 11-30 (A) 0 - 5 /hpf   RBC, UA 3-10 (A) 0 - 2 /hpf   Epithelial Cells (non renal) 0-10 0 - 10 /hpf   Casts Present (A) None seen /lpf   Cast Type Hyaline casts N/A   Mucus, UA Present (A) Not Estab.   Bacteria, UA Moderate (A) None seen/Few  Urinalysis, Complete  Result Value Ref Range   Specific Gravity, UA 1.020 1.005 - 1.030   pH, UA 6.5 5.0 - 7.5   Color, UA Yellow Yellow   Appearance Ur Cloudy (A) Clear    Leukocytes, UA 1+ (A) Negative   Protein, UA 2+ (A) Negative/Trace   Glucose, UA Negative Negative   Ketones, UA Negative Negative   RBC, UA Trace (A) Negative   Bilirubin, UA Negative Negative   Urobilinogen, Ur 0.2 0.2 - 1.0 mg/dL   Nitrite, UA Negative Negative   Microscopic Examination See below:     Pertinent Imaging: CLINICAL DATA:  37 year old female with acute RIGHT abdominal and pelvic pain. History of hysterectomy and RIGHT oophorectomy in 2012.  EXAM: CT ABDOMEN AND PELVIS WITH CONTRAST  TECHNIQUE: Multidetector CT imaging of the abdomen and pelvis was performed using the standard protocol following bolus administration of intravenous contrast.  CONTRAST:  71mL ISOVUE-300 IOPAMIDOL (ISOVUE-300) INJECTION 61%  COMPARISON:  07/15/2012 CT  FINDINGS: Lower chest: No acute abnormality  Hepatobiliary: The liver and gallbladder are unremarkable. No biliary dilatation.  Pancreas: Unremarkable  Spleen: Unremarkable  Adrenals/Urinary Tract: There is severe RIGHT hydroureteronephrosis to the distal RIGHT ureter. A 3 x 3.5 cm cystic structure within the RIGHT pelvis at the level of obstruction is noted (2:72). The very distal RIGHT ureter is collapsed. RIGHT perinephric and periureteral inflammation is noted.  The LEFT kidney, adrenal glands and bladder are unremarkable.  Stomach/Bowel: Stomach is within normal limits. Appendix appears normal. No evidence of bowel wall thickening, distention, or inflammatory changes.  Vascular/Lymphatic: Aortic atherosclerosis. No enlarged abdominal or pelvic lymph nodes.  Reproductive: Status post hysterectomy. The LEFT adnexal region is unremarkable. The 3 x 3.5 cm cystic structure in the RIGHT adnexal region obstructing the distal RIGHT ureter as discussed above could represent an ovarian cyst as there appears to be a small amount of RIGHT ovarian tissue on the 2014 CT, after the reported RIGHT oophorectomy  in 2012.  Other: No ascites, pneumoperitoneum or abdominal wall hernia.  Musculoskeletal: No acute or suspicious bony abnormalities.  IMPRESSION: 1. Severe RIGHT hydroureteronephrosis to the distal RIGHT ureter, presumably caused by a 3 x 3.5 cm cystic structure in the RIGHT pelvis. This may represent a RIGHT ovarian cyst given that there appears to be some RIGHT ovarian tissue on the 2014 CT. 2.  Aortic Atherosclerosis (ICD10-I70.0).   Electronically Signed   By: Margarette Canada M.D.   On: 06/06/2017 10:02  CT scan was personally reviewed today with the patient  Assessment & Plan:    1. Hydronephrosis, unspecified hydronephrosis type CT scan reviewed-appears to be secondary to extrarenal/extrinsic compression of the ureter either secondary to pelvic adhesions  or compressive cystic mass Patient has been referred to tertiary care center for OB/GYN evaluation of the lesion but is still awaiting consultation We will contact Dr. Mitzie Na office to ensure that she has this appropriate follow-up In terms of management of her ureteral stent, she will need some exchange in 1 month, Persistent pyelogram, and ureteral stent exchange with a long-term stent until she can have definitive surgical management She is agreeable with plan Risk and benefits of surgery were discussed in detail including risk of bleeding, infection, damage to other structures, and stent pain All questions Preop UA/urine culture - Urinalysis, Complete - CULTURE, URINE COMPREHENSIVE  Hollice Espy, MD  [facility] [address], [place], [place] [address] [phone]  I spent 25 min with this patient of which greater than 50% was spent in counseling and coordination of care with the patient.

## 2017-06-27 LAB — CULTURE, URINE COMPREHENSIVE

## 2017-06-29 ENCOUNTER — Inpatient Hospital Stay: Admission: RE | Admit: 2017-06-29 | Payer: Medicaid Other | Source: Ambulatory Visit

## 2017-07-01 ENCOUNTER — Inpatient Hospital Stay: Admission: RE | Admit: 2017-07-01 | Payer: Medicaid Other | Source: Ambulatory Visit

## 2017-07-01 ENCOUNTER — Other Ambulatory Visit: Payer: Self-pay

## 2017-07-01 ENCOUNTER — Telehealth: Payer: Self-pay | Admitting: Radiology

## 2017-07-01 ENCOUNTER — Encounter
Admission: RE | Admit: 2017-07-01 | Discharge: 2017-07-01 | Disposition: A | Discharge status: Home / Self Care | Payer: Medicaid Other | Source: Ambulatory Visit | Attending: Urology | Admitting: Urology

## 2017-07-01 HISTORY — DX: Intra-abdominal and pelvic swelling, mass and lump, unspecified site: R19.00

## 2017-07-01 HISTORY — DX: Chronic kidney disease, unspecified: N18.9

## 2017-07-01 NOTE — Telephone Encounter (Signed)
Pt has missed 2 pre-admit testing appts. Notified mother of appt rescheduled again to 07/03/2017 @1 :43 & that if pt misses this appt we will have to postpone her surgery. Mother voices understanding & will notify pt.

## 2017-07-01 NOTE — Patient Instructions (Signed)
Your procedure is scheduled on: 07/06/17 Mon Report to Same Day Surgery 2nd floor medical mall [facility] Entrance-take elevator on left to 2nd floor.  Check in with surgery information desk.) To find out your arrival time please call [phone] between 1PM - 3PM on 07/03/17   Remember: Instructions that are not followed completely may result in serious medical risk, up to and including death, or upon the discretion of your surgeon and anesthesiologist your surgery may need to be rescheduled.    _x___ 1. Do not eat food after midnight the night before your procedure. You may drink clear liquids up to 2 hours before you are scheduled to arrive at the hospital for your procedure.  Do not drink clear liquids within 2 hours of your scheduled arrival to the hospital.  Clear liquids include  --Water or Apple juice without pulp  --Clear carbohydrate beverage such as ClearFast or Gatorade  --Black Coffee or Clear Tea (No milk, no creamers, do not add anything to                  the coffee or Tea Type 1 and type 2 diabetics should only drink water.  No gum chewing or hard candies.     __x__ 2. No Alcohol for 24 hours before or after surgery.   __x__3. No Smoking or e-cigarettes for 24 prior to surgery.  Do not use any chewable tobacco products for at least 6 hour prior to surgery   ____  4. Bring all medications with you on the day of surgery if instructed.    __x__ 5. Notify your doctor if there is any change in your medical condition     (cold, fever, infections).    x___6. On the morning of surgery brush your teeth with toothpaste and water.  You may rinse your mouth with mouth wash if you wish.  Do not swallow any toothpaste or mouthwash.   Do not wear jewelry, make-up, hairpins, clips or nail polish.  Do not wear lotions, powders, or perfumes. You may wear deodorant.  Do not shave 48 hours prior to surgery. Men may shave face and neck.  Do not bring valuables to the hospital.     [facility] is not responsible for any belongings or valuables.               Contacts, dentures or bridgework may not be worn into surgery.  Leave your suitcase in the car. After surgery it may be brought to your room.  For patients admitted to the hospital, discharge time is determined by your                       treatment team.  _  Patients discharged the day of surgery will not be allowed to drive home.  You will need someone to drive you home and stay with you the night of your procedure.    Please read over the following fact sheets that you were given:   [facility] Preparing for Surgery and or MRSA Information   _x___ Take anti-hypertensive listed below, cardiac, seizure, asthma,     anti-reflux and psychiatric medicines. These include:  1. None  2.  3.  4.  5.  6.  ____Fleets enema or Magnesium Citrate as directed.   _x___ Use CHG Soap or sage wipes as directed on instruction sheet   ____ Use inhalers on the day of surgery and bring to hospital day of surgery  ____ Stop Metformin and Janumet 2 days prior to surgery.    __x__ Take 1/2 of usual insulin dose the night before surgery and none on the morning     surgery.   _x___ Follow recommendations from Cardiologist, Pulmonologist or PCP regarding          stopping Aspirin, Coumadin, Plavix ,Eliquis, Effient, or Pradaxa, and Pletal.  X____Stop Anti-inflammatories such as Advil, Aleve, Ibuprofen, Motrin, Naproxen, Naprosyn, Goodies powders or aspirin products. OK to take Tylenol and                          Celebrex.   _x___ Stop supplements until after surgery.  But may continue Vitamin D, Vitamin B,       and multivitamin.   ____ Bring C-Pap to the hospital.

## 2017-07-03 ENCOUNTER — Inpatient Hospital Stay: Admission: RE | Admit: 2017-07-03 | Payer: Medicaid Other | Source: Ambulatory Visit

## 2017-07-05 MED ORDER — CIPROFLOXACIN IN D5W 400 MG/200ML IV SOLN
400.0000 mg | INTRAVENOUS | Status: AC
  Administered 2017-07-06: 400 mg via INTRAVENOUS

## 2017-07-06 ENCOUNTER — Ambulatory Visit: Payer: Medicaid Other | Admitting: Anesthesiology

## 2017-07-06 ENCOUNTER — Ambulatory Visit
Admission: RE | Admit: 2017-07-06 | Discharge: 2017-07-06 | Disposition: A | Discharge status: Home / Self Care | Payer: Medicaid Other | Source: Ambulatory Visit | Attending: Urology | Admitting: Urology

## 2017-07-06 ENCOUNTER — Encounter
Admission: RE | Disposition: A | Discharge status: Home / Self Care | Payer: Self-pay | Source: Ambulatory Visit | Attending: Urology

## 2017-07-06 ENCOUNTER — Other Ambulatory Visit: Payer: Self-pay

## 2017-07-06 DIAGNOSIS — E109 Type 1 diabetes mellitus without complications: Secondary | ICD-10-CM | POA: Diagnosis not present

## 2017-07-06 DIAGNOSIS — F17210 Nicotine dependence, cigarettes, uncomplicated: Secondary | ICD-10-CM | POA: Insufficient documentation

## 2017-07-06 DIAGNOSIS — N1330 Unspecified hydronephrosis: Secondary | ICD-10-CM

## 2017-07-06 HISTORY — PX: CYSTOSCOPY W/ URETERAL STENT PLACEMENT: SHX1429

## 2017-07-06 HISTORY — PX: CYSTOSCOPY W/ RETROGRADES: SHX1426

## 2017-07-06 LAB — GLUCOSE, CAPILLARY
GLUCOSE-CAPILLARY: 285 mg/dL — AB (ref 65–99)
GLUCOSE-CAPILLARY: 261 mg/dL — AB (ref 65–99)

## 2017-07-06 SURGERY — CYSTOSCOPY, FLEXIBLE, WITH STENT REPLACEMENT
Anesthesia: General | Site: Ureter | Laterality: Right | Wound class: Clean Contaminated

## 2017-07-06 MED ORDER — DEXAMETHASONE SODIUM PHOSPHATE 10 MG/ML IJ SOLN
INTRAMUSCULAR | Status: DC | PRN
  Administered 2017-07-06: 5 mg via INTRAVENOUS

## 2017-07-06 MED ORDER — MIDAZOLAM HCL 5 MG/5ML IJ SOLN
INTRAMUSCULAR | Status: DC | PRN
  Administered 2017-07-06: 2 mg via INTRAVENOUS

## 2017-07-06 MED ORDER — PROPOFOL 10 MG/ML IV BOLUS
INTRAVENOUS | Status: DC | PRN
  Administered 2017-07-06: 150 mg via INTRAVENOUS

## 2017-07-06 MED ORDER — ONDANSETRON HCL 4 MG/2ML IJ SOLN
INTRAMUSCULAR | Status: DC | PRN
  Administered 2017-07-06: 4 mg via INTRAVENOUS

## 2017-07-06 MED ORDER — SODIUM CHLORIDE 0.9 % IV SOLN
INTRAVENOUS | Status: DC
  Administered 2017-07-06: 11:00:00 via INTRAVENOUS

## 2017-07-06 MED ORDER — FENTANYL CITRATE (PF) 100 MCG/2ML IJ SOLN: 25.0000 ug | INTRAMUSCULAR | Status: DC | PRN

## 2017-07-06 MED ORDER — LIDOCAINE HCL (CARDIAC) PF 100 MG/5ML IV SOSY
PREFILLED_SYRINGE | INTRAVENOUS | Status: DC | PRN
  Administered 2017-07-06: 60 mg via INTRAVENOUS

## 2017-07-06 MED ORDER — PROMETHAZINE HCL 25 MG/ML IJ SOLN: 6.2500 mg | INTRAMUSCULAR | Status: DC | PRN

## 2017-07-06 MED ORDER — FENTANYL CITRATE (PF) 100 MCG/2ML IJ SOLN
INTRAMUSCULAR | Status: AC
  Filled 2017-07-06: qty 2

## 2017-07-06 MED ORDER — PROPOFOL 10 MG/ML IV BOLUS
INTRAVENOUS | Status: AC
  Filled 2017-07-06: qty 20

## 2017-07-06 MED ORDER — CIPROFLOXACIN IN D5W 400 MG/200ML IV SOLN
INTRAVENOUS | Status: AC
  Filled 2017-07-06: qty 200

## 2017-07-06 MED ORDER — MIDAZOLAM HCL 2 MG/2ML IJ SOLN
INTRAMUSCULAR | Status: AC
  Filled 2017-07-06: qty 2

## 2017-07-06 MED ORDER — FENTANYL CITRATE (PF) 100 MCG/2ML IJ SOLN
INTRAMUSCULAR | Status: DC | PRN
  Administered 2017-07-06: 25 ug via INTRAVENOUS

## 2017-07-06 SURGICAL SUPPLY — 20 items
BAG DRAIN CYSTO-URO LG1000N (MISCELLANEOUS) ×3 IMPLANT
BRUSH SCRUB EZ  4% CHG (MISCELLANEOUS) ×2
BRUSH SCRUB EZ 4% CHG (MISCELLANEOUS) ×1 IMPLANT
CATH URETL 5X70 OPEN END (CATHETERS) ×3 IMPLANT
CONRAY 43 FOR UROLOGY 50M (MISCELLANEOUS) ×3 IMPLANT
DRAPE UTILITY 15X26 TOWEL STRL (DRAPES) ×3 IMPLANT
GLOVE BIO SURGEON STRL SZ 6.5 (GLOVE) ×2 IMPLANT
GLOVE BIO SURGEONS STRL SZ 6.5 (GLOVE) ×1
GOWN STRL REUS W/ TWL LRG LVL3 (GOWN DISPOSABLE) ×2 IMPLANT
GOWN STRL REUS W/TWL LRG LVL3 (GOWN DISPOSABLE) ×4
KIT TURNOVER CYSTO (KITS) ×3 IMPLANT
PACK CYSTO AR (MISCELLANEOUS) ×3 IMPLANT
SENSORWIRE 0.038 NOT ANGLED (WIRE) ×3
SET CYSTO W/LG BORE CLAMP LF (SET/KITS/TRAYS/PACK) ×3 IMPLANT
SOL .9 NS 3000ML IRR  AL (IV SOLUTION) ×2
SOL .9 NS 3000ML IRR UROMATIC (IV SOLUTION) ×1 IMPLANT
STENT URO INLAY 6FRX26CM (STENTS) ×3 IMPLANT
SURGILUBE 2OZ TUBE FLIPTOP (MISCELLANEOUS) ×3 IMPLANT
WATER STERILE IRR 1000ML POUR (IV SOLUTION) ×3 IMPLANT
WIRE SENSOR 0.038 NOT ANGLED (WIRE) ×1 IMPLANT

## 2017-07-06 NOTE — Interval H&P Note (Signed)
History and Physical Interval Note:  07/06/2017 11:29 AM  Nichole Wiley  has presented today for surgery, with the diagnosis of right hydronephrosis  The various methods of treatment have been discussed with the patient and family. After consideration of risks, benefits and other options for treatment, the patient has consented to  Procedure(s): CYSTOSCOPY WITH STENT REPLACEMENT (Right) CYSTOSCOPY WITH RETROGRADE PYELOGRAM (Right) as a surgical intervention .  The patient's history has been reviewed, patient examined, no change in status, stable for surgery.  I have reviewed the patient's chart and labs.  Questions were answered to the patient's satisfaction.    RRR CTAB  Hollice Espy

## 2017-07-06 NOTE — Transfer of Care (Signed)
Immediate Anesthesia Transfer of Care Note  Patient: Nichole Wiley  Procedure(s) Performed: CYSTOSCOPY WITH STENT REPLACEMENT (Right Ureter) CYSTOSCOPY WITH RETROGRADE PYELOGRAM (Right Ureter)  Patient Location: PACU  Anesthesia Type:General  Level of Consciousness: sedated  Airway & Oxygen Therapy: Patient Spontanous Breathing and Patient connected to face mask oxygen  Post-op Assessment: Report given to RN and Post -op Vital signs reviewed and stable  Post vital signs: Reviewed and stable  Last Vitals:  Vitals Value Taken Time  BP    Temp    Pulse 61 07/06/2017 12:31 PM  Resp    SpO2 100 % 07/06/2017 12:31 PM  Vitals shown include unvalidated device data.  Last Pain:  Vitals:   07/06/17 1010  TempSrc: Oral  PainSc: 3          Complications: No apparent anesthesia complications

## 2017-07-06 NOTE — Anesthesia Preprocedure Evaluation (Signed)
Anesthesia Evaluation  Patient identified by MRN, date of birth, ID band Patient awake    Reviewed: Allergy & Precautions, H&P , NPO status , Patient's Chart, lab work & pertinent test results, reviewed documented beta blocker date and time   History of Anesthesia Complications Negative for: history of anesthetic complications  Airway Mallampati: I  TM Distance: >3 FB Neck ROM: full    Dental  (+) Missing, Poor Dentition   Pulmonary neg shortness of breath, neg COPD, neg recent URI, Current Smoker,           Cardiovascular Exercise Tolerance: Good negative cardio ROS       Neuro/Psych negative neurological ROS  negative psych ROS   GI/Hepatic Neg liver ROS, GERD  ,  Endo/Other  diabetes  Renal/GU CRFRenal disease  negative genitourinary   Musculoskeletal   Abdominal   Peds  Hematology negative hematology ROS (+)   Anesthesia Other Findings Past Medical History: No date: Arthritis     Comment: back No date: Colon polyp No date: Diabetes 1.5, managed as type 1 ([facility]) No date: Drug addiction ([place])     Comment: hx (pain pills after c-sect) No date: GERD (gastroesophageal reflux disease) No date: Pelvic floor dysfunction No date: Urine incontinence   Reproductive/Obstetrics negative OB ROS                             Anesthesia Physical  Anesthesia Plan  ASA: II  Anesthesia Plan: General   Post-op Pain Management:    Induction: Intravenous  PONV Risk Score and Plan: 1 and Ondansetron and Dexamethasone  Airway Management Planned: LMA  Additional Equipment:   Intra-op Plan:   Post-operative Plan: Extubation in OR  Informed Consent: I have reviewed the patients History and Physical, chart, labs and discussed the procedure including the risks, benefits and alternatives for the proposed anesthesia with the patient or authorized representative who has indicated his/her  understanding and acceptance.   Dental Advisory Given  Plan Discussed with: Anesthesiologist, CRNA and Surgeon  Anesthesia Plan Comments:         Anesthesia Quick Evaluation

## 2017-07-06 NOTE — Anesthesia Procedure Notes (Signed)
Procedure Name: LMA Insertion Date/Time: 07/06/2017 12:07 PM Performed by: Dionne Bucy, CRNA Pre-anesthesia Checklist: Patient identified, Patient being monitored, Timeout performed, Emergency Drugs available and Suction available Patient Re-evaluated:Patient Re-evaluated prior to induction Oxygen Delivery Method: Circle system utilized Preoxygenation: Pre-oxygenation with 100% oxygen Induction Type: IV induction Ventilation: Mask ventilation without difficulty LMA: LMA inserted LMA Size: 4.0 Tube type: Oral Number of attempts: 1 Placement Confirmation: positive ETCO2 and breath sounds checked- equal and bilateral Tube secured with: Tape Dental Injury: Teeth and Oropharynx as per pre-operative assessment

## 2017-07-06 NOTE — Anesthesia Post-op Follow-up Note (Signed)
Anesthesia QCDR form completed.        

## 2017-07-06 NOTE — Discharge Instructions (Signed)
You have a ureteral stent in place.  This is a tube that extends from your kidney to your bladder.  This may cause urinary bleeding, burning with urination, and urinary frequency.  Please call our office or present to the ED if you develop fevers >101 or pain which is not able to be controlled with oral pain medications.  You may be given either Flomax and/ or ditropan to help with bladder spasms and stent pain in addition to pain medications.  Your stent is temporary.  If it remains in for more than 6 months, it could cause permanent kidney damage.    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  A urologist may also be involved in your care.  Ideally, your stent will be removed by these doctors at the time of surgery.  If not, please return to our office in 5 months to discuss stent exchange.  [facility] [address] [place], [place] [address] [phone]   AMBULATORY SURGERY  DISCHARGE INSTRUCTIONS  1) The drugs that you were given will stay in your system until tomorrow so for the next 24 hours you should not:  A) Drive an automobile B) Make any legal decisions C) Drink any alcoholic beverage  2) You may resume regular meals tomorrow.  Today it is better to start with liquids and gradually work up to solid foods. You may eat anything you prefer, but it is better to start with liquids, then soup and crackers, and gradually work up to solid foods.  3) Please notify your doctor immediately if you have any unusual bleeding, trouble breathing, redness and pain at the surgery site, drainage, fever, or pain not relieved by medication.  4) Additional Instructions:   Please contact your physician with any problems or Same Day Surgery at [phone], Monday through Friday 6 am to 4 pm, or [facility] at [facility] number at [phone].

## 2017-07-06 NOTE — Op Note (Signed)
Date of procedure: 07/06/17  Preoperative diagnosis:  1. Right hydronephrosis  Postoperative diagnosis:  1. Same as above  Procedure: 1. Cystoscopy 2. Right retrograde pyelogram 3. Right ureteral stent exchange  Surgeon: Hollice Espy, MD  Anesthesia: General  Complications: None  Intraoperative findings: Hydroureteronephrosis was transition point at the pelvic inlet/iliacs without an obvious filling defect.  Stent exchange without difficulty.  EBL: Minimal  Specimens: None  Drains: 6 x 26 French Bard Optima ureteral stent on right  Indication: Nichole Wiley is a 37 y.o. patient with severe hydroureteronephrosis down to the level of presumably obstructive cystic right pelvic mass.  She underwent ureteral stent placement and is awaiting further evaluation at tertiary care center.  She is due for stent exchange.  After reviewing the management options for treatment, he elected to proceed with the above surgical procedure(s). We have discussed the potential benefits and risks of the procedure, side effects of the proposed treatment, the likelihood of the patient achieving the goals of the procedure, and any potential problems that might occur during the procedure or recuperation. Informed consent has been obtained.  Description of procedure:  The patient was taken to the operating room and general anesthesia was induced.  The patient was placed in the dorsal lithotomy position, prepped and draped in the usual sterile fashion, and preoperative antibiotics were administered. A preoperative time-out was performed.   A 21 French scope was advanced per urethra into the bladder.  Attention was turned to the right UO for which a ureteral stent was seen emanating.  The distal coil the stent was grasped and brought to level the urethral meatus.  This was then cannulated using a sensor wire up to level the kidney.  The stent was removed and the wire was left in place.  A 5 French open-ended  ureteral catheter was intubated just within the mouth of the UO and a gentle retrograde pyelogram was performed along the wire.  This showed a fairly abrupt transition point at the level of the pelvic inlet consistent with the area of extrinsic compression seen on previous CT scan.  There are no filling defects otherwise in the lumen of the ureter at this level although narrowed appeared normal.  Next, a 6 x 26 French Bard Optima ureteral stent was advanced over the wire up to level the renal pelvis.  The wires partially drawn a focal is noted within the renal pelvis.  The wires and fully withdrawn a full coils noted within the bladder.  The bladder was then drained.  Patient was then clean and dry, repositioned in the supine position, reversed from anesthesia, taken to PACU in stable condition.  Plan: Patient has been referred to tertiary care center by her local GYN doctor for further evaluation of her right adnexal mass.  Presumably, once the mass was removed, the obstruction will be resolved as well.  I did discuss with the patient in the preoperative holding area as well as her aunt today the importance of follow-up with urology and ultimately stent removal within 6 months as well as discussed the possible complications of retained stent.  She is agreeable to this plan.  Hollice Espy, M.D.

## 2017-07-06 NOTE — Anesthesia Postprocedure Evaluation (Signed)
Anesthesia Post Note  Patient: Nichole Wiley  Procedure(s) Performed: CYSTOSCOPY WITH STENT REPLACEMENT (Right Ureter) CYSTOSCOPY WITH RETROGRADE PYELOGRAM (Right Ureter)  Patient location during evaluation: PACU Anesthesia Type: General Level of consciousness: awake and alert Pain management: pain level controlled Vital Signs Assessment: post-procedure vital signs reviewed and stable Respiratory status: spontaneous breathing, nonlabored ventilation, respiratory function stable and patient connected to nasal cannula oxygen Cardiovascular status: blood pressure returned to baseline and stable Postop Assessment: no apparent nausea or vomiting Anesthetic complications: no     Last Vitals:  Vitals:   07/06/17 1317 07/06/17 1339  BP:  131/68  Pulse:  69  Resp: 12 16  Temp: 36.4 C 36.4 C  SpO2:  100%    Last Pain:  Vitals:   07/06/17 1339  TempSrc: Oral  PainSc:                  Martha Clan

## 2017-07-07 ENCOUNTER — Encounter: Payer: Self-pay | Admitting: Urology

## 2017-07-08 ENCOUNTER — Telehealth: Payer: Self-pay | Admitting: Radiology

## 2017-07-08 NOTE — Telephone Encounter (Signed)
Spoke with pt regarding tertiary referral by pt's gynecologist for treatment of pelvic mass. Pt stated that she had called to make appt but needed her insurance ID #. Pt did not have this information, so gave pt the ID# that was found on insurance company's website. Pt states she will call to make appt.

## 2017-12-08 ENCOUNTER — Encounter: Payer: Self-pay | Admitting: Urology

## 2017-12-08 ENCOUNTER — Ambulatory Visit: Payer: Medicaid Other | Admitting: Urology

## 2018-11-23 ENCOUNTER — Other Ambulatory Visit
Admission: RE | Admit: 2018-11-23 | Discharge: 2018-11-23 | Disposition: A | Discharge status: Home / Self Care | Payer: Medicaid Other | Source: Ambulatory Visit | Attending: Nephrology | Admitting: Nephrology

## 2018-11-23 ENCOUNTER — Other Ambulatory Visit: Payer: Self-pay | Admitting: Nephrology

## 2018-11-23 DIAGNOSIS — R809 Proteinuria, unspecified: Secondary | ICD-10-CM | POA: Diagnosis not present

## 2018-11-23 DIAGNOSIS — N183 Chronic kidney disease, stage 3 unspecified: Secondary | ICD-10-CM

## 2018-11-23 LAB — COMPREHENSIVE METABOLIC PANEL
ALT: 12 U/L (ref 0–44)
AST: 15 U/L (ref 15–41)
Albumin: 2.7 g/dL — ABNORMAL LOW (ref 3.5–5.0)
Alkaline Phosphatase: 86 U/L (ref 38–126)
Anion gap: 7 (ref 5–15)
BUN: 29 mg/dL — ABNORMAL HIGH (ref 6–20)
CO2: 27 mmol/L (ref 22–32)
Calcium: 8.4 mg/dL — ABNORMAL LOW (ref 8.9–10.3)
Chloride: 105 mmol/L (ref 98–111)
Creatinine, Ser: 2 mg/dL — ABNORMAL HIGH (ref 0.44–1.00)
GFR calc Af Amer: 35 mL/min — ABNORMAL LOW (ref >60)
GFR calc non Af Amer: 30 mL/min — ABNORMAL LOW (ref >60)
Glucose, Bld: 99 mg/dL (ref 70–99)
Potassium: 3.8 mmol/L (ref 3.5–5.1)
Sodium: 139 mmol/L (ref 135–145)
Total Bilirubin: 0.5 mg/dL (ref 0.3–1.2)
Total Protein: 5.6 g/dL — ABNORMAL LOW (ref 6.5–8.1)

## 2018-11-23 LAB — LIPID PANEL
Cholesterol: 258 mg/dL — ABNORMAL HIGH (ref 0–200)
HDL: 55 mg/dL (ref >40)
LDL Cholesterol: 180 mg/dL — ABNORMAL HIGH (ref 0–99)
Total CHOL/HDL Ratio: 4.7 RATIO
Triglycerides: 113 mg/dL (ref <150)
VLDL: 23 mg/dL (ref 0–40)

## 2018-11-23 LAB — PROTIME-INR
INR: 1 (ref 0.8–1.2)
Prothrombin Time: 12.8 seconds (ref 11.4–15.2)

## 2018-11-23 LAB — CBC
HCT: 27.5 % — ABNORMAL LOW (ref 36.0–46.0)
Hemoglobin: 9.1 g/dL — ABNORMAL LOW (ref 12.0–15.0)
MCH: 31 pg (ref 26.0–34.0)
MCHC: 33.1 g/dL (ref 30.0–36.0)
MCV: 93.5 fL (ref 80.0–100.0)
Platelets: 128 10*3/uL — ABNORMAL LOW (ref 150–400)
RBC: 2.94 MIL/uL — ABNORMAL LOW (ref 3.87–5.11)
RDW: 13.5 % (ref 11.5–15.5)
WBC: 6.9 10*3/uL (ref 4.0–10.5)
nRBC: 0 % (ref 0.0–0.2)

## 2018-11-23 LAB — PHOSPHORUS: Phosphorus: 5.2 mg/dL — ABNORMAL HIGH (ref 2.5–4.6)

## 2018-11-24 ENCOUNTER — Other Ambulatory Visit: Payer: Self-pay | Admitting: Nephrology

## 2018-11-24 DIAGNOSIS — R809 Proteinuria, unspecified: Secondary | ICD-10-CM

## 2018-11-25 LAB — MISC LABCORP TEST (SEND OUT)
LabCorp test name: 141330
Labcorp test code: 141330

## 2018-11-30 ENCOUNTER — Other Ambulatory Visit: Payer: Self-pay | Admitting: Nephrology

## 2018-11-30 DIAGNOSIS — R809 Proteinuria, unspecified: Secondary | ICD-10-CM

## 2018-12-07 ENCOUNTER — Other Ambulatory Visit: Payer: Self-pay | Admitting: Radiology

## 2018-12-08 ENCOUNTER — Other Ambulatory Visit: Payer: Self-pay

## 2018-12-08 ENCOUNTER — Ambulatory Visit
Admission: RE | Admit: 2018-12-08 | Discharge: 2018-12-08 | Disposition: A | Discharge status: Home / Self Care | Payer: Medicaid Other | Source: Ambulatory Visit | Attending: Nephrology | Admitting: Nephrology

## 2018-12-08 DIAGNOSIS — N189 Chronic kidney disease, unspecified: Secondary | ICD-10-CM | POA: Diagnosis not present

## 2018-12-08 DIAGNOSIS — E1022 Type 1 diabetes mellitus with diabetic chronic kidney disease: Secondary | ICD-10-CM | POA: Diagnosis not present

## 2018-12-08 DIAGNOSIS — Z794 Long term (current) use of insulin: Secondary | ICD-10-CM | POA: Diagnosis not present

## 2018-12-08 DIAGNOSIS — Z7982 Long term (current) use of aspirin: Secondary | ICD-10-CM | POA: Insufficient documentation

## 2018-12-08 DIAGNOSIS — M545 Low back pain: Secondary | ICD-10-CM | POA: Diagnosis not present

## 2018-12-08 DIAGNOSIS — F17210 Nicotine dependence, cigarettes, uncomplicated: Secondary | ICD-10-CM | POA: Diagnosis not present

## 2018-12-08 DIAGNOSIS — R809 Proteinuria, unspecified: Secondary | ICD-10-CM | POA: Diagnosis not present

## 2018-12-08 DIAGNOSIS — Z79899 Other long term (current) drug therapy: Secondary | ICD-10-CM | POA: Insufficient documentation

## 2018-12-08 LAB — CBC
HCT: 31.8 % — ABNORMAL LOW (ref 36.0–46.0)
Hemoglobin: 10.6 g/dL — ABNORMAL LOW (ref 12.0–15.0)
MCH: 30.8 pg (ref 26.0–34.0)
MCHC: 33.3 g/dL (ref 30.0–36.0)
MCV: 92.4 fL (ref 80.0–100.0)
Platelets: 163 10*3/uL (ref 150–400)
RBC: 3.44 MIL/uL — ABNORMAL LOW (ref 3.87–5.11)
RDW: 13.4 % (ref 11.5–15.5)
WBC: 9.2 10*3/uL (ref 4.0–10.5)
nRBC: 0 % (ref 0.0–0.2)

## 2018-12-08 LAB — GLUCOSE, CAPILLARY
Glucose-Capillary: 245 mg/dL — ABNORMAL HIGH (ref 70–99)
Glucose-Capillary: 220 mg/dL — ABNORMAL HIGH (ref 70–99)

## 2018-12-08 LAB — PROTIME-INR
INR: 0.9 (ref 0.8–1.2)
Prothrombin Time: 12.5 seconds (ref 11.4–15.2)

## 2018-12-08 MED ORDER — FENTANYL CITRATE (PF) 100 MCG/2ML IJ SOLN
INTRAMUSCULAR | Status: AC | PRN
  Administered 2018-12-08: 50 ug via INTRAVENOUS

## 2018-12-08 MED ORDER — FENTANYL CITRATE (PF) 100 MCG/2ML IJ SOLN
INTRAMUSCULAR | Status: AC
  Filled 2018-12-08: qty 4

## 2018-12-08 MED ORDER — HYDRALAZINE HCL 20 MG/ML IJ SOLN
INTRAMUSCULAR | Status: AC | PRN
  Administered 2018-12-08: 10 mg via INTRAVENOUS

## 2018-12-08 MED ORDER — MIDAZOLAM HCL 2 MG/2ML IJ SOLN
INTRAMUSCULAR | Status: AC | PRN
  Administered 2018-12-08 (×3): 1 mg via INTRAVENOUS

## 2018-12-08 MED ORDER — MIDAZOLAM HCL 5 MG/5ML IJ SOLN
INTRAMUSCULAR | Status: AC
  Filled 2018-12-08: qty 5

## 2018-12-08 MED ORDER — SODIUM CHLORIDE 0.9 % IV SOLN: INTRAVENOUS | Status: DC

## 2018-12-08 MED ORDER — HYDRALAZINE HCL 20 MG/ML IJ SOLN
INTRAMUSCULAR | Status: AC
  Filled 2018-12-08: qty 1

## 2018-12-08 NOTE — Procedures (Signed)
Pre Procedure Dx: Proteinuria Post Procedural Dx: Same  Technically successful US guided biopsy of inferior pole of the right kidney.  EBL: None  No immediate complications.   Jay Mikeal Winstanley, MD Pager #: [phone]    

## 2018-12-08 NOTE — Consult Note (Signed)
Chief Complaint: Proteinuria of uncertain etiology  Referring Physician(s): Singh,Harmeet  Patient Status: [facility] - Out-pt  History of Present Illness: Nichole Wiley is a 38 y.o. female with past medical history significant for diabetes and drug addiction (currently on methadone) who presents today for ultrasound-guided random renal biopsy for the evaluation of proteinuria.  Patient is unaccompanied and serves as her own historian.  Patient admits to chronic mild lower back pain.  She is otherwise without complaint.  Specifically, no chest pain, shortness of breath, fever or chills.    Past Medical History:  Diagnosis Date  . Abdominal mass   . Arthritis    back  . Chronic kidney disease   . Colon polyp   . Diabetes 1.5, managed as type 1 ([place])   . Drug addiction ([place])    hx (pain pills after c-sect)  . GERD (gastroesophageal reflux disease)   . Pelvic floor dysfunction   . Urine incontinence     Past Surgical History:  Procedure Laterality Date  . ABDOMINAL HYSTERECTOMY  2005  . CESAREAN SECTION  2003  . COLONOSCOPY WITH PROPOFOL N/A 09/14/2015   Procedure: COLONOSCOPY WITH PROPOFOL;  Surgeon: Lucilla Lame, MD;  Location: [place];  Service: Endoscopy;  Laterality: N/A;  Diabetic - insulin  . CYSTOSCOPY W/ RETROGRADES Right 07/06/2017   Procedure: CYSTOSCOPY WITH RETROGRADE PYELOGRAM;  Surgeon: Hollice Espy, MD;  Location: [facility];  Service: Urology;  Laterality: Right;  . CYSTOSCOPY W/ URETERAL STENT PLACEMENT Right 06/06/2017   Procedure: CYSTOSCOPY WITH RETROGRADE PYELOGRAM/URETERAL STENT PLACEMENT;  Surgeon: Lucas Mallow, MD;  Location: [facility];  Service: Urology;  Laterality: Right;  . CYSTOSCOPY W/ URETERAL STENT PLACEMENT Right 07/06/2017   Procedure: CYSTOSCOPY WITH STENT REPLACEMENT;  Surgeon: Hollice Espy, MD;  Location: [facility];  Service: Urology;  Laterality: Right;  . ESOPHAGOGASTRODUODENOSCOPY (EGD) WITH PROPOFOL N/A 06/19/2016   Procedure: ESOPHAGOGASTRODUODENOSCOPY (EGD) WITH PROPOFOL;  Surgeon: Jonathon Bellows, MD;  Location: [facility];  Service: Endoscopy;  Laterality: N/A;  . LAPAROSCOPY    . LYSIS OF ADHESION    . POLYPECTOMY  09/14/2015   Procedure: POLYPECTOMY;  Surgeon: Lucilla Lame, MD;  Location: [place];  Service: Endoscopy;;  . RIGHT OOPHORECTOMY  2012  . TONSILLECTOMY  1987   and adenoids  . TUBAL LIGATION  2004  . VULVECTOMY  2011   lipoma    Allergies: Cephalexin and Penicillins  Medications: Prior to Admission medications   Medication Sig Start Date End Date Taking? Authorizing Provider  aspirin EC 81 MG tablet Take 81 mg by mouth daily.   Yes [provider]  insulin aspart (NOVOLOG) 100 UNIT/ML injection Inject 0-4 Units into the skin 3 (three) times daily with meals.    Yes [provider]  Insulin Glargine (LANTUS SOLOSTAR) 100 UNIT/ML Solostar Pen Inject 42 Units into the skin at bedtime.    Yes [provider]  methadone (DOLOPHINE) 10 MG/ML solution Take 170 mg by mouth daily.   Yes [provider]  oxybutynin (DITROPAN XL) 5 MG 24 hr tablet Take 1 tablet (5 mg total) by mouth at bedtime. Patient not taking: Reported on 07/01/2017 06/07/17   Homero Fellers, MD  phenazopyridine (PYRIDIUM) 100 MG tablet Take 1 tablet (100 mg total) by mouth 3 (three) times daily as needed for pain. Patient not taking: Reported on 07/01/2017 06/07/17   Homero Fellers, MD     Family History  Problem Relation Age of Onset  . Depression  Mother   . Diabetes Father   . Alcohol abuse Father   . Heart disease Maternal Grandmother   . Depression Maternal Grandmother   . Dementia Maternal Grandmother     Social History   Socioeconomic History  . Marital status: Single    Spouse name: Not on file  . Number of children: 2  . Years of education: Not on file  . Highest education level: Not on file  Occupational History  . Not on file  Social Needs   . Financial resource strain: Not very hard  . Food insecurity    Worry: Never true    Inability: Never true  . Transportation needs    Medical: No    Non-medical: No  Tobacco Use  . Smoking status: Current Every Day Smoker    Packs/day: 0.50    Years: 24.00    Pack years: 12.00    Types: Cigarettes  . Smokeless tobacco: Never Used  . Tobacco comment: has cut down from 2 PPD  Substance and Sexual Activity  . Alcohol use: No  . Drug use: No  . Sexual activity: Yes  Lifestyle  . Physical activity    Days per week: Not on file    Minutes per session: Not on file  . Stress: Not on file  Relationships  . Social connections    Talks on phone: More than three times a week    Gets together: Not on file    Attends religious service: Not on file    Active member of club or organization: Not on file    Attends meetings of clubs or organizations: Not on file    Relationship status: Not on file  Other Topics Concern  . Not on file  Social History Narrative  . Not on file    ECOG Status: 1 - Symptomatic but completely ambulatory  Review of Systems: A 12 point ROS discussed and pertinent positives are indicated in the HPI above.  All other systems are negative.  Review of Systems  Vital Signs: BP (!) 185/94   Pulse 94   Temp 98.5 F (36.9 C) (Oral)   Resp 18   Ht 5\' 7"  (1.702 m)   Wt 86.2 kg   SpO2 100%   BMI 29.76 kg/m   Physical Exam  Imaging: No results found.  Labs:  CBC: Recent Labs    11/23/18 1719 12/08/18 1028  WBC 6.9 9.2  HGB 9.1* 10.6*  HCT 27.5* 31.8*  PLT 128* 163    COAGS: Recent Labs    11/23/18 1719 12/08/18 1028  INR 1.0 0.9    BMP: Recent Labs    11/23/18 1719  NA 139  K 3.8  CL 105  CO2 27  GLUCOSE 99  BUN 29*  CALCIUM 8.4*  CREATININE 2.00*  GFRNONAA 30*  GFRAA 35*    LIVER FUNCTION TESTS: Recent Labs    11/23/18 1719  BILITOT 0.5  AST 15  ALT 12  ALKPHOS 86  PROT 5.6*  ALBUMIN 2.7*    TUMOR MARKERS:  No results for input(s): AFPTM, CEA, CA199, CHROMGRNA in the last 8760 hours.  Assessment and Plan:   [name] is a 38 y.o. female with past medical history significant for diabetes and drug addiction (currently on methadone) who presents today for ultrasound-guided random renal biopsy for the evaluation of proteinuria.  Patient is unaccompanied and serves as her own historian.  Patient admits to chronic mild lower back pain.  She is otherwise  without complaint.   Risks and benefits of ultrasound-guided random renal biopsy was discussed with the patient and/or patient's family including, but not limited to bleeding, infection, damage to adjacent structures, hematuria, worsening renal function, and/or  low yield requiring additional tests.  All of the questions were answered and there is agreement to proceed.  Consent signed and in chart.  Thank you for this interesting consult.  I greatly enjoyed meeting DACODA SPALLONE and look forward to participating in their care.  A copy of this report was sent to the requesting provider on this date.  Electronically Signed: Sandi Mariscal, MD 12/08/2018, 11:02 AM   I spent a total of 15 Minutes in face to face in clinical consultation, greater than 50% of which was counseling/coordinating care for ultrasound-guided renal biopsy.

## 2018-12-08 NOTE — Progress Notes (Signed)
DR. Pascal Lux AT BEDSIDE, SPEAKING WITH PT.Marland Kitchen AND HER MOTHER. Both verbalized understanding of conversation.

## 2018-12-15 ENCOUNTER — Encounter: Payer: Self-pay | Admitting: Nephrology

## 2018-12-15 LAB — SURGICAL PATHOLOGY

## 2019-04-14 ENCOUNTER — Other Ambulatory Visit: Payer: Self-pay

## 2019-04-14 ENCOUNTER — Encounter: Payer: Self-pay | Admitting: Obstetrics & Gynecology

## 2019-04-14 ENCOUNTER — Ambulatory Visit (INDEPENDENT_AMBULATORY_CARE_PROVIDER_SITE_OTHER): Payer: Medicaid Other | Admitting: Obstetrics & Gynecology

## 2019-04-14 VITALS — BP 140/80 | Ht 67.0 in | Wt 208.0 lb

## 2019-04-14 DIAGNOSIS — N644 Mastodynia: Secondary | ICD-10-CM

## 2019-04-14 NOTE — Progress Notes (Signed)
HPI:      [name] is a 39 y.o. [id number] who is premenopausal, presents today for a problem visit.  She is s/p TAH and later RSO.  She has not had hot flashes or sign of menopause. She complains of breast tenderness on the bilaterallyside which she first noticed three days ago.  It has decreased.  Associated symptoms include none.  Denies nipple discharge or skin changes.  No fever.  Prior Mammogram: no. Prior breast problems: No Family History: Breast Cancer-relatedfamily history is not on file.  PMHx: She  has a past medical history of Abdominal mass, Arthritis, Chronic kidney disease, Colon polyp, Diabetes 1.5, managed as type 1 ([place]), Drug addiction ([place]), GERD (gastroesophageal reflux disease), Pelvic floor dysfunction, and Urine incontinence. Also,  has a past surgical history that includes Tonsillectomy (1987); Cesarean section (2003); Tubal ligation (2004); Abdominal hysterectomy (2005); Right oophorectomy (2012); laparoscopy; Lysis of adhesion; Colonoscopy with propofol (N/A, 09/14/2015); polypectomy (09/14/2015); Esophagogastroduodenoscopy (egd) with propofol (N/A, 06/19/2016); Vulvectomy (2011); Cystoscopy w/ ureteral stent placement (Right, 06/06/2017); Cystoscopy w/ ureteral stent placement (Right, 07/06/2017); and Cystoscopy w/ retrogrades (Right, 07/06/2017)., family history includes Alcohol abuse in her father; Dementia in her maternal grandmother; Depression in her maternal grandmother and mother; Diabetes in her father; Heart disease in her maternal grandmother.,  reports that she has been smoking cigarettes. She has a 12.00 pack-year smoking history. She has never used smokeless tobacco. She reports that she does not drink alcohol or use drugs.  She has a current medication list which includes the following prescription(s): insulin aspart, insulin glargine, methadone, phenazopyridine, aspirin ec, and oxybutynin. Also, is allergic to cephalexin and penicillins.  Review of Systems    Constitutional: Negative for chills, fever and malaise/fatigue.  HENT: Negative for congestion, sinus pain and sore throat.   Eyes: Negative for blurred vision and pain.  Respiratory: Negative for cough and wheezing.   Cardiovascular: Negative for chest pain and leg swelling.  Gastrointestinal: Negative for abdominal pain, constipation, diarrhea, heartburn, nausea and vomiting.  Genitourinary: Negative for dysuria, frequency, hematuria and urgency.  Musculoskeletal: Negative for back pain, joint pain, myalgias and neck pain.  Skin: Negative for itching and rash.  Neurological: Negative for dizziness, tremors and weakness.  Endo/Heme/Allergies: Does not bruise/bleed easily.  Psychiatric/Behavioral: Negative for depression. The patient is not nervous/anxious and does not have insomnia.     Objective: BP 140/80   Ht 5\' 7"  (1.702 m)   Wt 208 lb (94.3 kg)   BMI 32.58 kg/m  Physical Exam Constitutional:      General: She is not in acute distress.    Appearance: Normal appearance. She is well-developed.  Cardiovascular:     Rate and Rhythm: Normal rate and regular rhythm.     Pulses: Normal pulses.     Heart sounds: Normal heart sounds. No murmur. No friction rub. No gallop.   Pulmonary:     Effort: Pulmonary effort is normal.     Breath sounds: Normal breath sounds.  Chest:     Chest wall: No mass, tenderness or edema.     Breasts:        Right: No inverted nipple, mass, nipple discharge, skin change or tenderness.        Left: No inverted nipple, mass, nipple discharge, skin change or tenderness.     Comments: Min T on exam today, no mass or skin changes Musculoskeletal:        General: Normal range of motion.  Lymphadenopathy:  Upper Body:     Right upper body: No pectoral adenopathy.     Left upper body: No pectoral adenopathy.  Neurological:     Mental Status: She is alert and oriented to person, place, and time.  Skin:    General: Skin is warm and dry.     Findings:  No abrasion, bruising, erythema, lesion or rash.  Psychiatric:        Speech: Speech normal.        Behavior: Behavior normal.        Judgment: Judgment normal.  Vitals reviewed.     ASSESSMENT/PLAN:  1. Breast tenderness - likely related to fluid retention due to renal failure from diabetes - MM DIAG BREAST TOMO BILATERAL to be scheduled as she is due at age 81 and as a precautionary additional measure  A total of 20 minutes were spent face-to-face with the patient as well as preparation, review, communication, and documentation during this encounter.   Barnett Applebaum, MD, [facility], [facility] 04/14/2019  3:42 PM

## 2019-04-15 ENCOUNTER — Telehealth: Payer: Self-pay | Admitting: Obstetrics & Gynecology

## 2019-04-15 NOTE — Telephone Encounter (Signed)
-----   Message from [place] Lodge sent at 04/15/2019  2:44 PM EST ----- Regarding: FW: MMG Dx Please call [facility] asap to schedule the patient. Thanks. ----- Message ----- From: Gae Dry, MD Sent: 04/14/2019   3:42 PM EST To: Alexandria Lodge Subject: MMG Dx                                         Breast T bilateral, so Dx MMG [facility]

## 2019-04-15 NOTE — Telephone Encounter (Signed)
Phone number on file not accepting calls at this time

## 2019-04-15 NOTE — Telephone Encounter (Signed)
Called to schedule appointment for Patient. Roselyn Reef is requesting patient call so they can ask questions so they know how patient needs to be schedule.

## 2019-04-27 ENCOUNTER — Other Ambulatory Visit: Payer: Self-pay | Admitting: Obstetrics & Gynecology

## 2019-04-27 DIAGNOSIS — N644 Mastodynia: Secondary | ICD-10-CM

## 2019-05-03 NOTE — Telephone Encounter (Signed)
Left message for patient to call office back about getting her scheduled for her mammo at [place].

## 2019-05-04 ENCOUNTER — Telehealth: Payer: Self-pay | Admitting: Obstetrics & Gynecology

## 2019-05-04 NOTE — Telephone Encounter (Signed)
Patient scheduled for her mammo on 05/11/19 @ 11:00am

## 2019-05-11 ENCOUNTER — Other Ambulatory Visit: Payer: Medicaid Other

## 2019-05-16 ENCOUNTER — Other Ambulatory Visit: Payer: Self-pay | Admitting: Nephrology

## 2019-05-16 DIAGNOSIS — R600 Localized edema: Secondary | ICD-10-CM

## 2019-05-16 MED ORDER — POTASSIUM CHLORIDE ER 10 MEQ PO TBCR: 10.0000 meq | EXTENDED_RELEASE_TABLET | Freq: Once | ORAL | 0 refills | Status: DC

## 2019-05-16 MED ORDER — FUROSEMIDE 10 MG/ML IJ SOLN: 80.0000 mg | Freq: Once | INTRAMUSCULAR | Status: DC

## 2019-05-17 ENCOUNTER — Ambulatory Visit: Admission: RE | Admit: 2019-05-17 | Payer: Medicaid Other | Source: Ambulatory Visit

## 2019-05-17 ENCOUNTER — Encounter: Payer: Self-pay | Admitting: Nephrology

## 2019-06-09 ENCOUNTER — Other Ambulatory Visit: Payer: Self-pay | Admitting: Ophthalmology

## 2019-06-09 DIAGNOSIS — H5713 Ocular pain, bilateral: Secondary | ICD-10-CM

## 2019-06-17 ENCOUNTER — Ambulatory Visit: Admission: RE | Admit: 2019-06-17 | Payer: Medicaid Other | Source: Ambulatory Visit

## 2019-06-20 ENCOUNTER — Other Ambulatory Visit: Payer: Self-pay | Admitting: Ophthalmology

## 2019-06-20 ENCOUNTER — Ambulatory Visit
Admission: RE | Admit: 2019-06-20 | Discharge: 2019-06-20 | Disposition: A | Discharge status: Home / Self Care | Payer: Medicaid Other | Source: Ambulatory Visit | Attending: Ophthalmology | Admitting: Ophthalmology

## 2019-06-20 ENCOUNTER — Other Ambulatory Visit: Payer: Self-pay | Admitting: Obstetrics & Gynecology

## 2019-06-20 ENCOUNTER — Other Ambulatory Visit: Payer: Self-pay

## 2019-06-20 DIAGNOSIS — N644 Mastodynia: Secondary | ICD-10-CM

## 2019-06-20 DIAGNOSIS — H5713 Ocular pain, bilateral: Secondary | ICD-10-CM | POA: Diagnosis not present

## 2019-06-20 HISTORY — DX: Essential (primary) hypertension: I10

## 2019-06-20 LAB — POCT I-STAT CREATININE: Creatinine, Ser: 2.9 mg/dL — ABNORMAL HIGH (ref 0.44–1.00)

## 2019-06-20 MED ORDER — IOHEXOL 300 MG/ML  SOLN: 75.0000 mL | Freq: Once | INTRAMUSCULAR | Status: DC | PRN

## 2019-06-22 ENCOUNTER — Other Ambulatory Visit: Payer: Self-pay | Admitting: Ophthalmology

## 2019-06-22 DIAGNOSIS — H5713 Ocular pain, bilateral: Secondary | ICD-10-CM

## 2019-06-28 ENCOUNTER — Ambulatory Visit: Payer: Medicaid Other

## 2019-07-01 ENCOUNTER — Ambulatory Visit: Payer: Medicaid Other

## 2019-07-07 ENCOUNTER — Telehealth: Payer: Self-pay | Admitting: Obstetrics & Gynecology

## 2019-07-07 NOTE — Telephone Encounter (Signed)
-----   Message from Gae Dry, MD sent at 07/07/2019 10:12 AM EDT ----- Regarding: MMG Check and see if pt still plans on scheduling MMG, as she is due and she was also having breast T which is why we ordered diagnostic MMG

## 2019-07-07 NOTE — Telephone Encounter (Signed)
Called pt to follow up. VM was full. Call home number - her mother, who said she would have pt call me back. She called back and said appts were canceled because of not being auth by her ins. I don't see that but ok. I adv that all breast imaging tests have been approved and gave her the number to [facility] to schedule because she couldn't be decisive as to when would be a good time for me to schedule them for her. I will place her in my follow up folder to make sure she is seen.

## 2019-07-21 ENCOUNTER — Other Ambulatory Visit: Payer: Self-pay | Admitting: Obstetrics & Gynecology

## 2019-07-21 DIAGNOSIS — N644 Mastodynia: Secondary | ICD-10-CM

## 2019-07-29 ENCOUNTER — Other Ambulatory Visit: Payer: Medicaid Other

## 2019-08-09 ENCOUNTER — Ambulatory Visit
Admission: RE | Admit: 2019-08-09 | Discharge: 2019-08-09 | Disposition: A | Discharge status: Home / Self Care | Payer: Medicaid Other | Source: Ambulatory Visit | Attending: Obstetrics & Gynecology | Admitting: Obstetrics & Gynecology

## 2019-08-09 ENCOUNTER — Encounter: Payer: Self-pay | Admitting: Internal Medicine

## 2019-08-09 DIAGNOSIS — N644 Mastodynia: Secondary | ICD-10-CM | POA: Diagnosis not present

## 2021-03-26 ENCOUNTER — Ambulatory Visit: Payer: Self-pay

## 2021-03-26 NOTE — Telephone Encounter (Signed)
°  Chief Complaint: nausea and vomiting Symptoms: nauseated all the time and worse after eating. Vomits most every meal Frequency: since November Pertinent Negatives: Patient denies pain or green emesis Disposition: [] ED /[] Urgent Care (no appt availability in office) / [] Appointment(In office/virtual)/ [x]  [facility]/ [] Home Care/ [] Refused Recommended Disposition /[] [facility]/ []  Follow-up with PCP Additional Notes: has a new pt appt 06/27/21- refused Mobile bus, previously seen in [place], advised [facility]     Reason for Disposition  Vomiting is a chronic symptom (recurrent or ongoing AND present > 4 weeks)  Answer Assessment - Initial Assessment Questions 1. VOMITING SEVERITY: "How many times have you vomited in the past 24 hours?"     - MILD:  1 - 2 times/day    - MODERATE: 3 - 5 times/day, decreased oral intake without significant weight loss or symptoms of dehydration    - SEVERE: 6 or more times/day, vomits everything or nearly everything, with significant weight loss, symptoms of dehydration      moderate 2. ONSET: "When did the vomiting begin?"      11/22 3. FLUIDS: "What fluids or food have you vomited up today?" "Have you been able to keep any fluids down?"     everything 4. ABDOMINAL PAIN: "Are your having any abdominal pain?" If yes : "How bad is it and what does it feel like?" (e.g., crampy, dull, intermittent, constant)      Looks swollen after eating- nausea is constant pain after eating intensifies nausea 5. DIARRHEA: "Is there any diarrhea?" If Yes, ask: "How many times today?"      mo 6. CONTACTS: "Is there anyone else in the family with the same symptoms?"      no 7. CAUSE: "What do you think is causing your vomiting?"     unknown 8. HYDRATION STATUS: "Any signs of dehydration?" (e.g., dry mouth [not only dry lips], too weak to stand) "When did you last urinate?"     Weak and headache- last urinated this am  9. OTHER SYMPTOMS: "Do you  have any other symptoms?" (e.g., fever, headache, vertigo, vomiting blood or coffee grounds, recent head injury)     headache 10. PREGNANCY: "Is there any chance you are pregnant?" "When was your last menstrual period?"       *No Answer*  Protocols used: Vomiting-A-AH

## 2021-04-27 LAB — HEMOGLOBIN A1C: Hemoglobin A1C: 5

## 2021-06-27 ENCOUNTER — Ambulatory Visit (INDEPENDENT_AMBULATORY_CARE_PROVIDER_SITE_OTHER): Payer: Medicaid Other | Admitting: Internal Medicine

## 2021-06-27 ENCOUNTER — Encounter: Payer: Self-pay | Admitting: Internal Medicine

## 2021-06-27 VITALS — BP 192/94 | HR 86 | Temp 97.3°F | Ht 67.0 in | Wt 182.0 lb

## 2021-06-27 DIAGNOSIS — K5909 Other constipation: Secondary | ICD-10-CM | POA: Diagnosis not present

## 2021-06-27 DIAGNOSIS — E663 Overweight: Secondary | ICD-10-CM

## 2021-06-27 DIAGNOSIS — Z992 Dependence on renal dialysis: Secondary | ICD-10-CM | POA: Insufficient documentation

## 2021-06-27 DIAGNOSIS — Z6828 Body mass index (BMI) 28.0-28.9, adult: Secondary | ICD-10-CM

## 2021-06-27 DIAGNOSIS — K219 Gastro-esophageal reflux disease without esophagitis: Secondary | ICD-10-CM

## 2021-06-27 DIAGNOSIS — N186 End stage renal disease: Secondary | ICD-10-CM | POA: Diagnosis not present

## 2021-06-27 DIAGNOSIS — M47819 Spondylosis without myelopathy or radiculopathy, site unspecified: Secondary | ICD-10-CM

## 2021-06-27 DIAGNOSIS — E7800 Pure hypercholesterolemia, unspecified: Secondary | ICD-10-CM

## 2021-06-27 DIAGNOSIS — E785 Hyperlipidemia, unspecified: Secondary | ICD-10-CM | POA: Insufficient documentation

## 2021-06-27 DIAGNOSIS — I150 Renovascular hypertension: Secondary | ICD-10-CM

## 2021-06-27 DIAGNOSIS — E109 Type 1 diabetes mellitus without complications: Secondary | ICD-10-CM

## 2021-06-27 DIAGNOSIS — M1990 Unspecified osteoarthritis, unspecified site: Secondary | ICD-10-CM | POA: Insufficient documentation

## 2021-06-27 DIAGNOSIS — N185 Chronic kidney disease, stage 5: Secondary | ICD-10-CM

## 2021-06-27 DIAGNOSIS — D631 Anemia in chronic kidney disease: Secondary | ICD-10-CM | POA: Insufficient documentation

## 2021-06-27 DIAGNOSIS — I10 Essential (primary) hypertension: Secondary | ICD-10-CM | POA: Insufficient documentation

## 2021-06-27 MED ORDER — CARVEDILOL 6.25 MG PO TABS: 6.2500 mg | ORAL_TABLET | Freq: Two times a day (BID) | ORAL | 0 refills | Status: DC

## 2021-06-27 MED ORDER — ACCU-CHEK SOFTCLIX LANCETS MISC: 4 refills | Status: AC

## 2021-06-27 NOTE — Patient Instructions (Signed)
Hypertension, Adult ?Hypertension is another name for high blood pressure. High blood pressure forces your heart to work harder to pump blood. This can cause problems over time. ?There are two numbers in a blood pressure reading. There is a top number (systolic) over a bottom number (diastolic). It is best to have a blood pressure that is below 120/80. ?What are the causes? ?The cause of this condition is not known. Some other conditions can lead to high blood pressure. ?What increases the risk? ?Some lifestyle factors can make you more likely to develop high blood pressure: ?Smoking. ?Not getting enough exercise or physical activity. ?Being overweight. ?Having too much fat, sugar, calories, or salt (sodium) in your diet. ?Drinking too much alcohol. ?Other risk factors include: ?Having any of these conditions: ?Heart disease. ?Diabetes. ?High cholesterol. ?Kidney disease. ?Obstructive sleep apnea. ?Having a family history of high blood pressure and high cholesterol. ?Age. The risk increases with age. ?Stress. ?What are the signs or symptoms? ?High blood pressure may not cause symptoms. Very high blood pressure (hypertensive crisis) may cause: ?Headache. ?Fast or uneven heartbeats (palpitations). ?Shortness of breath. ?Nosebleed. ?Vomiting or feeling like you may vomit (nauseous). ?Changes in how you see. ?Very bad chest pain. ?Feeling dizzy. ?Seizures. ?How is this treated? ?This condition is treated by making healthy lifestyle changes, such as: ?Eating healthy foods. ?Exercising more. ?Drinking less alcohol. ?Your doctor may prescribe medicine if lifestyle changes do not help enough and if: ?Your top number is above 130. ?Your bottom number is above 80. ?Your personal target blood pressure may vary. ?Follow these instructions at home: ?Eating and drinking ? ?If told, follow the DASH eating plan. To follow this plan: ?Fill one half of your plate at each meal with fruits and vegetables. ?Fill one fourth of your plate  at each meal with whole grains. Whole grains include whole-wheat pasta, brown rice, and whole-grain bread. ?Eat or drink low-fat dairy products, such as skim milk or low-fat yogurt. ?Fill one fourth of your plate at each meal with low-fat (lean) proteins. Low-fat proteins include fish, chicken without skin, eggs, beans, and tofu. ?Avoid fatty meat, cured and processed meat, or chicken with skin. ?Avoid pre-made or processed food. ?Limit the amount of salt in your diet to less than 1,500 mg each day. ?Do not drink alcohol if: ?Your doctor tells you not to drink. ?You are pregnant, may be pregnant, or are planning to become pregnant. ?If you drink alcohol: ?Limit how much you have to: ?0-1 drink a day for women. ?0-2 drinks a day for men. ?Know how much alcohol is in your drink. In the [place] one drink equals one 12 oz bottle of beer (355 mL), one 5 oz glass of wine (148 mL), or one 1? oz glass of hard liquor (44 mL). ?Lifestyle ? ?Work with your doctor to stay at a healthy weight or to lose weight. Ask your doctor what the best weight is for you. ?Get at least 30 minutes of exercise that causes your heart to beat faster (aerobic exercise) most days of the week. This may include walking, swimming, or biking. ?Get at least 30 minutes of exercise that strengthens your muscles (resistance exercise) at least 3 days a week. This may include lifting weights or doing Pilates. ?Do not smoke or use any products that contain nicotine or tobacco. If you need help quitting, ask your doctor. ?Check your blood pressure at home as told by your doctor. ?Keep all follow-up visits. ?Medicines ?Take over-the-counter and prescription medicines   only as told by your doctor. Follow directions carefully. ?Do not skip doses of blood pressure medicine. The medicine does not work as well if you skip doses. Skipping doses also puts you at risk for problems. ?Ask your doctor about side effects or reactions to medicines that you should watch  for. ?Contact a doctor if: ?You think you are having a reaction to the medicine you are taking. ?You have headaches that keep coming back. ?You feel dizzy. ?You have swelling in your ankles. ?You have trouble with your vision. ?Get help right away if: ?You get a very bad headache. ?You start to feel mixed up (confused). ?You feel weak or numb. ?You feel faint. ?You have very bad pain in your: ?Chest. ?Belly (abdomen). ?You vomit more than once. ?You have trouble breathing. ?These symptoms may be an emergency. Get help right away. Call 911. ?Do not wait to see if the symptoms will go away. ?Do not drive yourself to the hospital. ?Summary ?Hypertension is another name for high blood pressure. ?High blood pressure forces your heart to work harder to pump blood. ?For most people, a normal blood pressure is less than 120/80. ?Making healthy choices can help lower blood pressure. If your blood pressure does not get lower with healthy choices, you may need to take medicine. ?This information is not intended to replace advice given to you by your health care provider. Make sure you discuss any questions you have with your health care provider. ?Document Revised: 12/13/2020 Document Reviewed: 12/13/2020 ?Elsevier Patient Education ? 2023 [employer] Inc[employer] ? ?

## 2021-06-27 NOTE — Assessment & Plan Note (Signed)
CBC reviewed Continue oral iron 

## 2021-06-27 NOTE — Assessment & Plan Note (Signed)
Resolved ?Encouraged high fiber diet and adequate water intake ?

## 2021-06-27 NOTE — Assessment & Plan Note (Signed)
Encouraged regular stretching  ?Recommend Tylenol OTC ?

## 2021-06-27 NOTE — Assessment & Plan Note (Signed)
Encouraged diet and exercise for weight loss ?

## 2021-06-27 NOTE — Assessment & Plan Note (Signed)
On peritoneal dialysis ?Follows with nephrology ?

## 2021-06-27 NOTE — Assessment & Plan Note (Signed)
Uncontrolled off meds ?Will restart Carvedilol 6.25 mg BID ?Reinforced DASH diet and exercise for weight loss ?

## 2021-06-27 NOTE — Assessment & Plan Note (Signed)
Not on any medications ?A1C reviewed ?Will check urine microalbumin at next visit ?Encouraged low carb diet and exercise for weight loss ?Will request copy of eye exam ?Encouraged routine foot exams ?

## 2021-06-27 NOTE — Assessment & Plan Note (Signed)
Encouraged weight loss as this can help reduce reflux symptoms ?Continue TUMS as needed ?

## 2021-06-27 NOTE — Progress Notes (Signed)
HPI ? ?Patient presents to clinic today to establish care and for management of the conditions listed below. ? 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  She is not taking any insulin or oral medication at this time.  Her sugars range 68-122. She checks her feet routinely. Her last eye exam was 06/2021, [facility]. She does not follow with endocrinology. ? ?HTN: Her BP today is 188/94.  She is not taking any antihypertensive medications at this time because she ran out of Carvedilol.  ECG from 01/2012 reviewed. ? ?GERD: Triggered by everything.  She takes Tums as needed appropriately for symptoms.  Upper GI from 06/2016 reviewed. ? ?OA: Mainly in her back.  She is not taking any pain medication OTC at this time. ? ?ESRD: She is getting peritoneal dialysis. Her last creatinine was 9.18, GFR 5, 04/2021. She follows with nephrology. ? ?Anemia of CKD: Her last H/H is 7.6/22.7, 04/2021. She is taking oral iron as prescribed. ? ?HLD: Her last LDL was 117, triglycerides 80, 2023. She is not taking any cholesterol lowering medication at this time. She tries to consume a low fat diet. ? ?Chronic Constipation: Currently not an issue. Colonoscopy from 09/2015 reviewed. ? ?Past Medical History:  ?Diagnosis Date  ? Abdominal mass   ? Arthritis   ? back  ? Chronic kidney disease   ? Colon polyp   ? Diabetes 1.5, managed as type 1 ([place])   ? Drug addiction ([place])   ? hx (pain pills after c-sect)  ? GERD (gastroesophageal reflux disease)   ? Hypertension   ? Pelvic floor dysfunction   ? Urine incontinence   ? ? ?Current Outpatient Medications  ?Medication Sig Dispense Refill  ? aspirin EC 81 MG tablet Take 81 mg by mouth daily.    ? methadone (DOLOPHINE) 10 MG/ML solution Take 170 mg by mouth daily.    ? ?No current facility-administered medications for this visit.  ? ? ?Allergies  ?Allergen Reactions  ? Cephalexin Other (See Comments) and Rash  ?  Unknown  ? Penicillins Rash  ?  Has patient had a PCN reaction causing immediate rash,  facial/tongue/throat swelling, SOB or lightheadedness with hypotension: No ?Has patient had a PCN reaction causing severe rash involving mucus membranes or skin necrosis: No ?Has patient had a PCN reaction that required hospitalization: No ?Has patient had a PCN reaction occurring within the last 10 years: No ?If all of the above answers are "NO", then may proceed with Cephalosporin use.  ? ? ?Family History  ?Problem Relation Age of Onset  ? Depression Mother   ? Diabetes Father   ? Alcohol abuse Father   ? Heart disease Maternal Grandmother   ? Depression Maternal Grandmother   ? Dementia Maternal Grandmother   ? ? ?Social History  ? ?Socioeconomic History  ? Marital status: Single  ?  Spouse name: Not on file  ? Number of children: 2  ? Years of education: Not on file  ? Highest education level: Not on file  ?Occupational History  ? Not on file  ?Tobacco Use  ? Smoking status: Every Day  ?  Packs/day: 0.50  ?  Years: 24.00  ?  Pack years: 12.00  ?  Types: Cigarettes  ? Smokeless tobacco: Never  ? Tobacco comments:  ?  has cut down from 2 PPD  ?Vaping Use  ? Vaping Use: Never used  ?Substance and Sexual Activity  ? Alcohol use: No  ? Drug use: No  ?  Sexual activity: Yes  ?Other Topics Concern  ? Not on file  ?Social History Narrative  ? Not on file  ? ?Social Determinants of Health  ? ?Financial Resource Strain: Not on file  ?Food Insecurity: Not on file  ?Transportation Needs: Not on file  ?Physical Activity: Not on file  ?Stress: Not on file  ?Social Connections: Not on file  ?Intimate Partner Violence: Not on file  ? ? ?ROS: ? ?Constitutional: Denies fever, malaise, fatigue, headache or abrupt weight changes.  ?HEENT: Denies eye pain, eye redness, ear pain, ringing in the ears, wax buildup, runny nose, nasal congestion, bloody nose, or sore throat. ?Respiratory: Denies difficulty breathing, shortness of breath, cough or sputum production.   ?Cardiovascular: Denies chest pain, chest tightness, palpitations or  swelling in the hands or feet.  ?Gastrointestinal: Denies abdominal pain, bloating, constipation, diarrhea or blood in the stool.  ?GU: Denies frequency, urgency, pain with urination, blood in urine, odor or discharge. ?Musculoskeletal: Patient reports chronic back pain.  Denies decrease in range of motion, difficulty with gait, muscle pain or joint swelling.  ?Skin: Denies redness, rashes, lesions or ulcercations.  ?Neurological: Denies dizziness, difficulty with memory, difficulty with speech or problems with balance and coordination.  ?Psych: Pt reports anxiety. Denies depression, SI/HI. ? ?No other specific complaints in a complete review of systems (except as listed in HPI above). ? ?PE: ? ?BP (!) 192/94 (BP Location: Right Arm, Patient Position: Sitting, Cuff Size: Large)   Pulse 86   Temp (!) 97.3 ?F (36.3 ?C) (Temporal)   Ht '5\' 7"'$  (1.702 m)   Wt 182 lb (82.6 kg)   SpO2 99%   BMI 28.51 kg/m?  ? ?Wt Readings from Last 3 Encounters:  ?04/14/19 208 lb (94.3 kg)  ?12/08/18 190 lb (86.2 kg)  ?07/06/17 166 lb (75.3 kg)  ? ? ?General: Appears her stated age, overweight, in NAD. ?HEENT: Head: normal shape and size; Eyes: sclera white, no icterus, conjunctiva pink, PERRLA and EOMs intact;  ?Cardiovascular: Normal rate and rhythm. S1,S2 noted.  No murmur, rubs or gallops noted. Trace pitting BLE edema.  ?Pulmonary/Chest: Normal effort and positive vesicular breath sounds. No respiratory distress. No wheezes, rales or ronchi noted.  ?Musculoskeletal:  No difficulty with gait.  ?Neurological: Alert and oriented.  ?Psychiatric: Mood and affect normal. Behavior is normal. Judgment and thought content normal.  ? ? ? ?BMET ?   ?Component Value Date/Time  ? NA 139 11/23/2018 1719  ? NA 127 (L) 01/20/2012 1610  ? K 3.8 11/23/2018 1719  ? K 3.5 01/20/2012 1610  ? CL 105 11/23/2018 1719  ? CL 91 (L) 01/20/2012 1610  ? CO2 27 11/23/2018 1719  ? CO2 26 01/20/2012 1610  ? GLUCOSE 99 11/23/2018 1719  ? GLUCOSE 560 (HH)  01/20/2012 1610  ? BUN 29 (H) 11/23/2018 1719  ? BUN 10 01/20/2012 1610  ? CREATININE 2.90 (H) 06/20/2019 1428  ? CREATININE 1.01 09/28/2015 1524  ? CALCIUM 8.4 (L) 11/23/2018 1719  ? CALCIUM 8.7 01/20/2012 1610  ? GFRNONAA 30 (L) 11/23/2018 1719  ? GFRNONAA 71 09/28/2015 1524  ? GFRAA 35 (L) 11/23/2018 1719  ? GFRAA 82 09/28/2015 1524  ? ? ?Lipid Panel  ?   ?Component Value Date/Time  ? CHOL 258 (H) 11/23/2018 1719  ? TRIG 113 11/23/2018 1719  ? HDL 55 11/23/2018 1719  ? CHOLHDL 4.7 11/23/2018 1719  ? VLDL 23 11/23/2018 1719  ? LDLCALC 180 (H) 11/23/2018 1719  ? ? ?CBC ?   ?  Component Value Date/Time  ? WBC 9.2 12/08/2018 1028  ? RBC 3.44 (L) 12/08/2018 1028  ? HGB 10.6 (L) 12/08/2018 1028  ? HGB 12.8 01/20/2012 1610  ? HCT 31.8 (L) 12/08/2018 1028  ? HCT 36.8 01/20/2012 1610  ? PLT 163 12/08/2018 1028  ? PLT 95 (L) 01/20/2012 1610  ? MCV 92.4 12/08/2018 1028  ? MCV 89 01/20/2012 1610  ? MCH 30.8 12/08/2018 1028  ? MCHC 33.3 12/08/2018 1028  ? RDW 13.4 12/08/2018 1028  ? RDW 12.6 01/20/2012 1610  ? ? ?Hgb A1C ?No results found for: HGBA1C ? ? ?Assessment and Plan: ? ? ? ?Webb Silversmith, NP ? ?

## 2021-06-27 NOTE — Assessment & Plan Note (Signed)
Lipid profile reviewed Encouraged her to consume a low-fat diet 

## 2021-07-05 ENCOUNTER — Other Ambulatory Visit: Payer: Self-pay | Admitting: Internal Medicine

## 2021-07-05 NOTE — Telephone Encounter (Signed)
Medication Refill - Medication:  ? ?glucose blood (ACCU-CHEK GUIDE) test strip  ? ?Has the patient contacted their pharmacy? Yes.  Contact pcp office.  ?(Agent: If no, request that the patient contact the pharmacy for the refill. If patient does not wish to contact the pharmacy document the reason why and proceed with request.) ?(Agent: If yes, when and what did the pharmacy advise?) ? ?Preferred Pharmacy (with phone number or street name):  ? ?[facility] [place], [place] AT [facility] OF [address]  ?[place] [address]  ?Phone: [phone] Fax: [phone]  ? ? ?Has the patient been seen for an appointment in the last year OR does the patient have an upcoming appointment? Yes.   ? ?Agent: Please be advised that RX refills may take up to 3 business days. We ask that you follow-up with your pharmacy. ? ?

## 2021-07-08 ENCOUNTER — Telehealth: Payer: Self-pay | Admitting: Internal Medicine

## 2021-07-08 MED ORDER — ACCU-CHEK GUIDE VI STRP: 1.0000 | ORAL_STRIP | 0 refills | Status: AC

## 2021-07-08 NOTE — Telephone Encounter (Signed)
Requested medication (s) are due for refill today: historical medication ? ?Requested medication (s) are on the active medication list: yes  ? ?Last refill:  09/26/19 ? ?Future visit scheduled: yes in 1 week ? ?Notes to clinic:  historical medication , do you want to renew Rx? ? ? ?  ?Requested Prescriptions  ?Pending Prescriptions Disp Refills  ? glucose blood (ACCU-CHEK GUIDE) test strip 100 each   ?  Sig: See admin instructions.  ?  ? Endocrinology: Diabetes - Testing Supplies Passed - 07/05/2021  4:35 PM  ?  ?  Passed - Valid encounter within last 12 months  ?  Recent Outpatient Visits   ? ?      ? 1 week ago ESRD (end stage renal disease) ([place])  ? [facility] [place], [place] W, NP  ? 5 years ago Type 1 diabetes mellitus on insulin therapy [facility])  ? [facility] Vinc[place] Hews, Genevie Cheshire, NP  ? ?  ?  ?Future Appointments   ? ?        ? In 1 week Baity, Coralie Keens, NP [facility], [place]  ? ?  ? ? ?  ?  ?  ? ?

## 2021-07-08 NOTE — Telephone Encounter (Signed)
Copied from [place]. Topic: General - Other ?>> Jul 08, 2021  2:45 PM Tessa Lerner A wrote: ?Reason for CRM: The patient would like to speak with a member of staff when possible ? ?The patient is calling to check on the status of their previously requested glucose blood (ACCU-CHEK GUIDE) test strip [[id number]]  ? ?Please contact further when available ?

## 2021-07-08 NOTE — Telephone Encounter (Signed)
Pt called, spoke with pt's mother Holley Raring. I advised her that test strips were sent in today to pharmacy. She states she would let pt know.  ?

## 2021-07-18 ENCOUNTER — Ambulatory Visit: Payer: Medicaid Other | Admitting: Internal Medicine

## 2021-07-18 NOTE — Progress Notes (Deleted)
Subjective:    Patient ID: Nichole Wiley, female    DOB: Jun 17, 1980, 41 y.o.   MRN: [id number]  HPI  Patient presents to clinic today for follow-up of HTN.  At her last visit, her BP was elevated.  She was restarted on Carvedilol 6.25 mg twice daily.  She has been taking her medications as prescribed.  Her BP today is.  ECG from 01/2012 reviewed.  Review of Systems     Past Medical History:  Diagnosis Date   Abdominal mass    Arthritis    back   Chronic kidney disease    Colon polyp    Diabetes 1.5, managed as type 1 ([place])    Drug addiction ([facility])    hx (pain pills after c-sect)   GERD (gastroesophageal reflux disease)    Hypertension    Pelvic floor dysfunction    Urine incontinence     Current Outpatient Medications  Medication Sig Dispense Refill   Accu-Chek Softclix Lancets lancets Use to check blood sugar daily for type 2 diabetes. E11.9 100 each 4   aspirin EC 81 MG tablet Take 81 mg by mouth daily.     carvedilol (COREG) 6.25 MG tablet Take 1 tablet (6.25 mg total) by mouth 2 (two) times daily with a meal. 60 tablet 0   ferrous sulfate 325 (65 FE) MG tablet Take by mouth.     glucose blood (ACCU-CHEK GUIDE) test strip 1 each by Other route See admin instructions. 100 each 0   methadone (DOLOPHINE) 10 MG/ML solution Take 170 mg by mouth daily.     naloxone (NARCAN) nasal spray 4 mg/0.1 mL One spray in either nostril once for known/suspected opioid overdose. May repeat every 2-3 minutes in alternating nostril til EMS arrives     No current facility-administered medications for this visit.    Allergies  Allergen Reactions   Cephalexin Other (See Comments) and Rash    Unknown   Penicillins Rash    Has patient had a PCN reaction causing immediate rash, facial/tongue/throat swelling, SOB or lightheadedness with hypotension: No Has patient had a PCN reaction causing severe rash involving mucus membranes or skin necrosis: No Has patient had a PCN reaction that  required hospitalization: No Has patient had a PCN reaction occurring within the last 10 years: No If all of the above answers are "NO", then may proceed with Cephalosporin use.    Family History  Problem Relation Age of Onset   Depression Mother    Diabetes Father    Heart disease Maternal Grandmother    Depression Maternal Grandmother    Dementia Maternal Grandmother    Diabetes Maternal Grandmother    Diabetes Paternal Grandmother     Social History   Socioeconomic History   Marital status: Single    Spouse name: Not on file   Number of children: 2   Years of education: Not on file   Highest education level: Not on file  Occupational History   Not on file  Tobacco Use   Smoking status: Every Day    Packs/day: 0.50    Years: 24.00    Pack years: 12.00    Types: Cigarettes   Smokeless tobacco: Never   Tobacco comments:    has cut down from 2 PPD  Vaping Use   Vaping Use: Never used  Substance and Sexual Activity   Alcohol use: No   Drug use: No   Sexual activity: Yes  Other Topics Concern   Not on  file  Social History Narrative   Not on file   Social Determinants of Health   Financial Resource Strain: Not on file  Food Insecurity: Not on file  Transportation Needs: Not on file  Physical Activity: Not on file  Stress: Not on file  Social Connections: Not on file  Intimate Partner Violence: Not on file     Constitutional: Denies fever, malaise, fatigue, headache or abrupt weight changes.  HEENT: Denies eye pain, eye redness, ear pain, ringing in the ears, wax buildup, runny nose, nasal congestion, bloody nose, or sore throat. Respiratory: Denies difficulty breathing, shortness of breath, cough or sputum production.   Cardiovascular: Denies chest pain, chest tightness, palpitations or swelling in the hands or feet.  Gastrointestinal: Denies abdominal pain, bloating, constipation, diarrhea or blood in the stool.  GU: Denies urgency, frequency, pain with  urination, burning sensation, blood in urine, odor or discharge. Musculoskeletal: Denies decrease in range of motion, difficulty with gait, muscle pain or joint pain and swelling.  Skin: Denies redness, rashes, lesions or ulcercations.  Neurological: Denies dizziness, difficulty with memory, difficulty with speech or problems with balance and coordination.  Psych: Denies anxiety, depression, SI/HI.  No other specific complaints in a complete review of systems (except as listed in HPI above).  Objective:   Physical Exam    There were no vitals taken for this visit. Wt Readings from Last 3 Encounters:  06/27/21 182 lb (82.6 kg)  04/14/19 208 lb (94.3 kg)  12/08/18 190 lb (86.2 kg)    General: Appears their stated age, well developed, well nourished in NAD. Skin: Warm, dry and intact. No rashes, lesions or ulcerations noted. HEENT: Head: normal shape and size; Eyes: sclera white, no icterus, conjunctiva pink, PERRLA and EOMs intact; Ears: Tm's gray and intact, normal light reflex; Nose: mucosa pink and moist, septum midline; Throat/Mouth: Teeth present, mucosa pink and moist, no exudate, lesions or ulcerations noted.  Neck:  Neck supple, trachea midline. No masses, lumps or thyromegaly present.  Cardiovascular: Normal rate and rhythm. S1,S2 noted.  No murmur, rubs or gallops noted. No JVD or BLE edema. No carotid bruits noted. Pulmonary/Chest: Normal effort and positive vesicular breath sounds. No respiratory distress. No wheezes, rales or ronchi noted.  Abdomen: Soft and nontender. Normal bowel sounds. No distention or masses noted. Liver, spleen and kidneys non palpable. Musculoskeletal: Normal range of motion. No signs of joint swelling. No difficulty with gait.  Neurological: Alert and oriented. Cranial nerves II-XII grossly intact. Coordination normal.  Psychiatric: Mood and affect normal. Behavior is normal. Judgment and thought content normal.   EKG:  BMET    Component Value  Date/Time   NA 139 11/23/2018 1719   NA 127 (L) 01/20/2012 1610   K 3.8 11/23/2018 1719   K 3.5 01/20/2012 1610   CL 105 11/23/2018 1719   CL 91 (L) 01/20/2012 1610   CO2 27 11/23/2018 1719   CO2 26 01/20/2012 1610   GLUCOSE 99 11/23/2018 1719   GLUCOSE 560 (HH) 01/20/2012 1610   BUN 29 (H) 11/23/2018 1719   BUN 10 01/20/2012 1610   CREATININE 2.90 (H) 06/20/2019 1428   CREATININE 1.01 09/28/2015 1524   CALCIUM 8.4 (L) 11/23/2018 1719   CALCIUM 8.7 01/20/2012 1610   GFRNONAA 30 (L) 11/23/2018 1719   GFRNONAA 71 09/28/2015 1524   GFRAA 35 (L) 11/23/2018 1719   GFRAA 82 09/28/2015 1524    Lipid Panel     Component Value Date/Time   CHOL 258 (H)  11/23/2018 1719   TRIG 113 11/23/2018 1719   HDL 55 11/23/2018 1719   CHOLHDL 4.7 11/23/2018 1719   VLDL 23 11/23/2018 1719   LDLCALC 180 (H) 11/23/2018 1719    CBC    Component Value Date/Time   WBC 9.2 12/08/2018 1028   RBC 3.44 (L) 12/08/2018 1028   HGB 10.6 (L) 12/08/2018 1028   HGB 12.8 01/20/2012 1610   HCT 31.8 (L) 12/08/2018 1028   HCT 36.8 01/20/2012 1610   PLT 163 12/08/2018 1028   PLT 95 (L) 01/20/2012 1610   MCV 92.4 12/08/2018 1028   MCV 89 01/20/2012 1610   MCH 30.8 12/08/2018 1028   MCHC 33.3 12/08/2018 1028   RDW 13.4 12/08/2018 1028   RDW 12.6 01/20/2012 1610    Hgb A1C Lab Results  Component Value Date   HGBA1C 5 04/27/2021          Assessment & Plan:    Webb Silversmith, NP

## 2021-08-07 ENCOUNTER — Encounter: Payer: Self-pay | Admitting: Internal Medicine

## 2021-08-07 ENCOUNTER — Ambulatory Visit: Payer: Medicaid Other | Admitting: Internal Medicine

## 2021-08-07 VITALS — BP 187/82 | HR 102 | Temp 97.3°F | Wt 180.0 lb

## 2021-08-07 DIAGNOSIS — K219 Gastro-esophageal reflux disease without esophagitis: Secondary | ICD-10-CM

## 2021-08-07 DIAGNOSIS — R0789 Other chest pain: Secondary | ICD-10-CM | POA: Diagnosis not present

## 2021-08-07 DIAGNOSIS — R0602 Shortness of breath: Secondary | ICD-10-CM | POA: Diagnosis not present

## 2021-08-07 DIAGNOSIS — R002 Palpitations: Secondary | ICD-10-CM

## 2021-08-07 DIAGNOSIS — N2889 Other specified disorders of kidney and ureter: Secondary | ICD-10-CM

## 2021-08-07 DIAGNOSIS — I151 Hypertension secondary to other renal disorders: Secondary | ICD-10-CM

## 2021-08-07 DIAGNOSIS — F418 Other specified anxiety disorders: Secondary | ICD-10-CM

## 2021-08-07 DIAGNOSIS — R7989 Other specified abnormal findings of blood chemistry: Secondary | ICD-10-CM

## 2021-08-07 MED ORDER — SERTRALINE HCL 25 MG PO TABS: 25.0000 mg | ORAL_TABLET | Freq: Every day | ORAL | 0 refills | Status: DC

## 2021-08-07 MED ORDER — LABETALOL HCL 100 MG PO TABS: 100.0000 mg | ORAL_TABLET | Freq: Two times a day (BID) | ORAL | 0 refills | Status: DC

## 2021-08-07 MED ORDER — OMEPRAZOLE 20 MG PO CPDR: 20.0000 mg | DELAYED_RELEASE_CAPSULE | Freq: Every day | ORAL | 0 refills | Status: AC

## 2021-08-07 NOTE — Patient Instructions (Signed)
Palpitations Palpitations are feelings that your heartbeat is not normal. Your heartbeat may feel like it is: Uneven (irregular). Faster than normal. Fluttering. Skipping a beat. This is usually not a serious problem. However, a doctor will do tests and check your medical history to make sure that you do not have a serious heart problem. Follow these instructions at home: Watch for any changes in your condition. Tell your doctor about any changes. Take these actions to help manage your symptoms: Eating and drinking Follow instructions from your doctor about things to eat and drink. You may be told to avoid these things: Drinks that have caffeine in them, such as coffee, tea, soft drinks, and energy drinks. Chocolate. Alcohol. Diet pills. Lifestyle     Try to lower your stress. These things can help you relax: Yoga. Deep breathing and meditation. Guided imagery. This is using words and images to create positive thoughts. Exercise, including swimming, jogging, and walking. Tell your doctor if you have more abnormal heartbeats when you are active. If you have chest pain or feel short of breath with exercise, do not keep doing the exercise until you are seen by your doctor. Biofeedback. This is using your mind to control things in your body, such as your heartbeat. Get plenty of rest and sleep. Keep a regular bed time. Do not use drugs, such as cocaine or ecstasy. Do not use marijuana. Do not smoke or use any products that contain nicotine or tobacco. If you need help quitting, ask your doctor. General instructions Take over-the-counter and prescription medicines only as told by your doctor. Keep all follow-up visits. You may need more tests if palpitations do not go away or get worse. Contact a doctor if: You keep having fast or uneven heartbeats for a long time. Your symptoms happen more often. Get help right away if: You have chest pain. You feel short of breath. You have a very  bad headache. You feel dizzy. You faint. These symptoms may be an emergency. Get help right away. Call your local emergency services (911 in the [place] Do not wait to see if the symptoms will go away. Do not drive yourself to the hospital. Summary Palpitations are feelings that your heartbeat is uneven or faster than normal. It may feel like your heart is fluttering or skipping a beat. Avoid food and drinks that may cause this condition. These include caffeine, chocolate, and alcohol. Try to lower your stress. Do not smoke or use drugs. Get help right away if you faint, feel dizzy, feel short of breath, have chest pain, or have a very bad headache. This information is not intended to replace advice given to you by your health care provider. Make sure you discuss any questions you have with your health care provider. Document Revised: 07/18/2020 Document Reviewed: 07/18/2020 Elsevier Patient Education  2023 Elsevier Inc.  

## 2021-08-07 NOTE — Progress Notes (Signed)
Subjective:    Patient ID: Nichole Wiley, female    DOB: 1980/05/17, 41 y.o.   MRN: [id number]  HPI  Patient presents to clinic today with complaint of palpitations and chest pain.  This started about two weeks ago. She describes the chest pain as tightness. She also reports rapid heart rate which has gotten up to 133. She reports it typically last for about 5 minutes then resolves. She reports increased stress in the last 2 weeks.  She does have a history of ESRD, does PD at night 2-3 times per week. She does feel like she is having reflux. She uses Tums frequently with good relief, but she does not feel like this is heart burn. She is unsure if this is anxiety.  Of note, her BP today is 220/108. She was on antihypertensive medication in the past but has not been on it since she ran out. She was on Carvedilol. ECG from 01/2012 reviewed.  Review of Systems     Past Medical History:  Diagnosis Date   Abdominal mass    Arthritis    back   Chronic kidney disease    Colon polyp    Diabetes 1.5, managed as type 1 ([place])    Drug addiction (HCC)    hx (pain pills after c-sect)   GERD (gastroesophageal reflux disease)    Hypertension    Pelvic floor dysfunction    Urine incontinence     Current Outpatient Medications  Medication Sig Dispense Refill   Accu-Chek Softclix Lancets lancets Use to check blood sugar daily for type 2 diabetes. E11.9 100 each 4   aspirin EC 81 MG tablet Take 81 mg by mouth daily.     carvedilol (COREG) 6.25 MG tablet Take 1 tablet (6.25 mg total) by mouth 2 (two) times daily with a meal. 60 tablet 0   ferrous sulfate 325 (65 FE) MG tablet Take by mouth.     glucose blood (ACCU-CHEK GUIDE) test strip 1 each by Other route See admin instructions. 100 each 0   methadone (DOLOPHINE) 10 MG/ML solution Take 170 mg by mouth daily.     naloxone (NARCAN) nasal spray 4 mg/0.1 mL One spray in either nostril once for known/suspected opioid overdose. May repeat every 2-3  minutes in alternating nostril til EMS arrives     No current facility-administered medications for this visit.    Allergies  Allergen Reactions   Cephalexin Other (See Comments) and Rash    Unknown   Penicillins Rash    Has patient had a PCN reaction causing immediate rash, facial/tongue/throat swelling, SOB or lightheadedness with hypotension: No Has patient had a PCN reaction causing severe rash involving mucus membranes or skin necrosis: No Has patient had a PCN reaction that required hospitalization: No Has patient had a PCN reaction occurring within the last 10 years: No If all of the above answers are "NO", then may proceed with Cephalosporin use.    Family History  Problem Relation Age of Onset   Depression Mother    Diabetes Father    Heart disease Maternal Grandmother    Depression Maternal Grandmother    Dementia Maternal Grandmother    Diabetes Maternal Grandmother    Diabetes Paternal Grandmother     Social History   Socioeconomic History   Marital status: Single    Spouse name: Not on file   Number of children: 2   Years of education: Not on file   Highest education level: Not on file  Occupational History   Not on file  Tobacco Use   Smoking status: Every Day    Packs/day: 0.50    Years: 24.00    Pack years: 12.00    Types: Cigarettes   Smokeless tobacco: Never   Tobacco comments:    has cut down from 2 PPD  Vaping Use   Vaping Use: Never used  Substance and Sexual Activity   Alcohol use: No   Drug use: No   Sexual activity: Yes  Other Topics Concern   Not on file  Social History Narrative   Not on file   Social Determinants of Health   Financial Resource Strain: Not on file  Food Insecurity: Not on file  Transportation Needs: Not on file  Physical Activity: Not on file  Stress: Not on file  Social Connections: Not on file  Intimate Partner Violence: Not on file     Constitutional: Denies fever, malaise, fatigue, headache or abrupt  weight changes.  Respiratory: Pt reports intermittent shortness of breath. Denies difficulty breathing, cough or sputum production.   Cardiovascular: Patient reports palpitations and chest pain.  Denies chest tightness, or swelling in the hands or feet.  Gastrointestinal: Pt reports reflux. Denies abdominal pain, bloating, constipation, diarrhea or blood in the stool.  Skin: Denies redness, rashes, lesions or ulcercations.  Neurological: Denies dizziness, difficulty with memory, difficulty with speech or problems with balance and coordination.  Psych: Pt reports stress and anxiety. Denies depression, SI/HI.  No other specific complaints in a complete review of systems (except as listed in HPI above).  Objective:   Physical Exam BP (!) 209/94 (BP Location: Left Arm, Patient Position: Sitting, Cuff Size: Normal)   Pulse (!) 101   Temp (!) 97.3 F (36.3 C) (Temporal)   Wt 180 lb (81.6 kg)   SpO2 100%   BMI 28.19 kg/m   Wt Readings from Last 3 Encounters:  06/27/21 182 lb (82.6 kg)  04/14/19 208 lb (94.3 kg)  12/08/18 190 lb (86.2 kg)    General: Appears her stated age, overweight, in NAD. Skin: Warm, dry and intact.  Neck:  Neck supple, trachea midline. No masses, lumps or thyromegaly present.  Cardiovascular: Tachycardic with normal rhythm. S1,S2 noted.  No murmur, rubs or gallops noted. No JVD or BLE edema.  Pulmonary/Chest: Normal effort and positive vesicular breath sounds. No respiratory distress. No wheezes, rales or ronchi noted.  Abdomen: Soft and nontender. Normal bowel sounds.  Musculoskeletal:  No difficulty with gait.  Neurological: Alert and oriented.  Psychiatric: Mood and affect normal. Mildly anxious apeparing. Judgment and thought content normal.    BMET    Component Value Date/Time   NA 139 11/23/2018 1719   NA 127 (L) 01/20/2012 1610   K 3.8 11/23/2018 1719   K 3.5 01/20/2012 1610   CL 105 11/23/2018 1719   CL 91 (L) 01/20/2012 1610   CO2 27 11/23/2018  1719   CO2 26 01/20/2012 1610   GLUCOSE 99 11/23/2018 1719   GLUCOSE 560 (HH) 01/20/2012 1610   BUN 29 (H) 11/23/2018 1719   BUN 10 01/20/2012 1610   CREATININE 2.90 (H) 06/20/2019 1428   CREATININE 1.01 09/28/2015 1524   CALCIUM 8.4 (L) 11/23/2018 1719   CALCIUM 8.7 01/20/2012 1610   GFRNONAA 30 (L) 11/23/2018 1719   GFRNONAA 71 09/28/2015 1524   GFRAA 35 (L) 11/23/2018 1719   GFRAA 82 09/28/2015 1524    Lipid Panel     Component Value Date/Time   CHOL 258 (  H) 11/23/2018 1719   TRIG 113 11/23/2018 1719   HDL 55 11/23/2018 1719   CHOLHDL 4.7 11/23/2018 1719   VLDL 23 11/23/2018 1719   LDLCALC 180 (H) 11/23/2018 1719    CBC    Component Value Date/Time   WBC 9.2 12/08/2018 1028   RBC 3.44 (L) 12/08/2018 1028   HGB 10.6 (L) 12/08/2018 1028   HGB 12.8 01/20/2012 1610   HCT 31.8 (L) 12/08/2018 1028   HCT 36.8 01/20/2012 1610   PLT 163 12/08/2018 1028   PLT 95 (L) 01/20/2012 1610   MCV 92.4 12/08/2018 1028   MCV 89 01/20/2012 1610   MCH 30.8 12/08/2018 1028   MCHC 33.3 12/08/2018 1028   RDW 13.4 12/08/2018 1028   RDW 12.6 01/20/2012 1610    Hgb A1C Lab Results  Component Value Date   HGBA1C 5 04/27/2021            Assessment & Plan:   Palpitations, Chest Tightness, Shortness of Breath, Reflux, Anxiety, Abnormal TSH, HTN:  Clonidine 0.1 mg PO x 1, repeat BP 187/82 Indication for ECG: chest tightness Interpretation of ECG: Sinus tachycardia Comparison of ECG: 01/2012 Will check CBC, CMET, TSH, Free T4 and Troponin Will start Omeprazole 20 mg daily Will start Labetalol 100 mg BID  Will start Sertraline 25 mg daily  RTC in 1 week for follow up HTN  Webb Silversmith, NP

## 2021-08-08 ENCOUNTER — Telehealth: Payer: Self-pay

## 2021-08-08 ENCOUNTER — Encounter: Payer: Self-pay | Admitting: Internal Medicine

## 2021-08-08 ENCOUNTER — Ambulatory Visit: Payer: Self-pay | Admitting: *Deleted

## 2021-08-08 ENCOUNTER — Emergency Department: Payer: Medicaid Other

## 2021-08-08 ENCOUNTER — Inpatient Hospital Stay
Admission: EM | Admit: 2021-08-08 | Discharge: 2021-08-10 | DRG: 682 | Disposition: A | Discharge status: Home / Self Care | Payer: Medicaid Other | Attending: Internal Medicine | Admitting: Internal Medicine

## 2021-08-08 ENCOUNTER — Other Ambulatory Visit: Payer: Self-pay

## 2021-08-08 DIAGNOSIS — D649 Anemia, unspecified: Secondary | ICD-10-CM | POA: Diagnosis not present

## 2021-08-08 DIAGNOSIS — K3184 Gastroparesis: Secondary | ICD-10-CM | POA: Diagnosis present

## 2021-08-08 DIAGNOSIS — Z818 Family history of other mental and behavioral disorders: Secondary | ICD-10-CM

## 2021-08-08 DIAGNOSIS — M479 Spondylosis, unspecified: Secondary | ICD-10-CM | POA: Diagnosis present

## 2021-08-08 DIAGNOSIS — Z7982 Long term (current) use of aspirin: Secondary | ICD-10-CM

## 2021-08-08 DIAGNOSIS — N2581 Secondary hyperparathyroidism of renal origin: Secondary | ICD-10-CM | POA: Diagnosis present

## 2021-08-08 DIAGNOSIS — E039 Hypothyroidism, unspecified: Secondary | ICD-10-CM | POA: Diagnosis present

## 2021-08-08 DIAGNOSIS — F17210 Nicotine dependence, cigarettes, uncomplicated: Secondary | ICD-10-CM | POA: Diagnosis present

## 2021-08-08 DIAGNOSIS — Z79891 Long term (current) use of opiate analgesic: Secondary | ICD-10-CM

## 2021-08-08 DIAGNOSIS — Z833 Family history of diabetes mellitus: Secondary | ICD-10-CM

## 2021-08-08 DIAGNOSIS — E1022 Type 1 diabetes mellitus with diabetic chronic kidney disease: Secondary | ICD-10-CM | POA: Diagnosis present

## 2021-08-08 DIAGNOSIS — E1043 Type 1 diabetes mellitus with diabetic autonomic (poly)neuropathy: Secondary | ICD-10-CM | POA: Diagnosis present

## 2021-08-08 DIAGNOSIS — F17200 Nicotine dependence, unspecified, uncomplicated: Secondary | ICD-10-CM | POA: Diagnosis present

## 2021-08-08 DIAGNOSIS — K219 Gastro-esophageal reflux disease without esophagitis: Secondary | ICD-10-CM | POA: Diagnosis present

## 2021-08-08 DIAGNOSIS — Z79890 Hormone replacement therapy (postmenopausal): Secondary | ICD-10-CM

## 2021-08-08 DIAGNOSIS — Z992 Dependence on renal dialysis: Secondary | ICD-10-CM

## 2021-08-08 DIAGNOSIS — F418 Other specified anxiety disorders: Secondary | ICD-10-CM | POA: Diagnosis present

## 2021-08-08 DIAGNOSIS — N186 End stage renal disease: Secondary | ICD-10-CM | POA: Diagnosis present

## 2021-08-08 DIAGNOSIS — E109 Type 1 diabetes mellitus without complications: Secondary | ICD-10-CM | POA: Diagnosis present

## 2021-08-08 DIAGNOSIS — Z880 Allergy status to penicillin: Secondary | ICD-10-CM

## 2021-08-08 DIAGNOSIS — I120 Hypertensive chronic kidney disease with stage 5 chronic kidney disease or end stage renal disease: Principal | ICD-10-CM | POA: Diagnosis present

## 2021-08-08 DIAGNOSIS — D631 Anemia in chronic kidney disease: Secondary | ICD-10-CM | POA: Diagnosis present

## 2021-08-08 DIAGNOSIS — Z881 Allergy status to other antibiotic agents status: Secondary | ICD-10-CM

## 2021-08-08 DIAGNOSIS — Z8249 Family history of ischemic heart disease and other diseases of the circulatory system: Secondary | ICD-10-CM

## 2021-08-08 DIAGNOSIS — Z79899 Other long term (current) drug therapy: Secondary | ICD-10-CM

## 2021-08-08 DIAGNOSIS — E119 Type 2 diabetes mellitus without complications: Secondary | ICD-10-CM

## 2021-08-08 DIAGNOSIS — E1343 Other specified diabetes mellitus with diabetic autonomic (poly)neuropathy: Secondary | ICD-10-CM

## 2021-08-08 DIAGNOSIS — F32A Depression, unspecified: Secondary | ICD-10-CM | POA: Diagnosis present

## 2021-08-08 DIAGNOSIS — I10 Essential (primary) hypertension: Secondary | ICD-10-CM | POA: Diagnosis present

## 2021-08-08 DIAGNOSIS — E1122 Type 2 diabetes mellitus with diabetic chronic kidney disease: Secondary | ICD-10-CM

## 2021-08-08 LAB — CBC
HCT: 22.2 % — ABNORMAL LOW (ref 36.0–46.0)
Hemoglobin: 6.9 g/dL — ABNORMAL LOW (ref 12.0–15.0)
MCH: 29 pg (ref 26.0–34.0)
MCHC: 31.1 g/dL (ref 30.0–36.0)
MCV: 93.3 fL (ref 80.0–100.0)
Platelets: 130 10*3/uL — ABNORMAL LOW (ref 150–400)
RBC: 2.38 MIL/uL — ABNORMAL LOW (ref 3.87–5.11)
RDW: 13.9 % (ref 11.5–15.5)
WBC: 8.9 10*3/uL (ref 4.0–10.5)
nRBC: 0 % (ref 0.0–0.2)

## 2021-08-08 LAB — IRON AND TIBC
Iron: 66 ug/dL (ref 28–170)
Saturation Ratios: 34 % — ABNORMAL HIGH (ref 10.4–31.8)
TIBC: 193 ug/dL — ABNORMAL LOW (ref 250–450)
UIBC: 127 ug/dL

## 2021-08-08 LAB — ABO/RH: ABO/RH(D): A POS

## 2021-08-08 LAB — BASIC METABOLIC PANEL
Anion gap: 7 (ref 5–15)
BUN: 89 mg/dL — ABNORMAL HIGH (ref 6–20)
CO2: 21 mmol/L — ABNORMAL LOW (ref 22–32)
Calcium: 7.7 mg/dL — ABNORMAL LOW (ref 8.9–10.3)
Chloride: 108 mmol/L (ref 98–111)
Creatinine, Ser: 14.24 mg/dL — ABNORMAL HIGH (ref 0.44–1.00)
GFR, Estimated: 3 mL/min — ABNORMAL LOW (ref >60)
Glucose, Bld: 203 mg/dL — ABNORMAL HIGH (ref 70–99)
Potassium: 3.6 mmol/L (ref 3.5–5.1)
Sodium: 136 mmol/L (ref 135–145)

## 2021-08-08 LAB — TROPONIN I (HIGH SENSITIVITY): Troponin I (High Sensitivity): 2988 ng/L (ref <18)

## 2021-08-08 LAB — GLUCOSE, CAPILLARY
Glucose-Capillary: 140 mg/dL — ABNORMAL HIGH (ref 70–99)
Glucose-Capillary: 147 mg/dL — ABNORMAL HIGH (ref 70–99)

## 2021-08-08 LAB — APTT: aPTT: 28 seconds (ref 24–36)

## 2021-08-08 LAB — PREPARE RBC (CROSSMATCH)

## 2021-08-08 LAB — PROTIME-INR
INR: 1.1 (ref 0.8–1.2)
Prothrombin Time: 14 seconds (ref 11.4–15.2)

## 2021-08-08 MED ORDER — SERTRALINE HCL 50 MG PO TABS
25.0000 mg | ORAL_TABLET | Freq: Every day | ORAL | Status: DC
  Administered 2021-08-09 – 2021-08-10 (×2): 25 mg via ORAL
  Filled 2021-08-08 (×2): qty 1

## 2021-08-08 MED ORDER — SODIUM CHLORIDE 0.9% FLUSH: 3.0000 mL | INTRAVENOUS | Status: DC | PRN

## 2021-08-08 MED ORDER — SODIUM CHLORIDE 0.9% FLUSH
3.0000 mL | Freq: Two times a day (BID) | INTRAVENOUS | Status: DC
  Administered 2021-08-08 – 2021-08-10 (×4): 3 mL via INTRAVENOUS

## 2021-08-08 MED ORDER — LEVOTHYROXINE SODIUM 25 MCG PO TABS
25.0000 ug | ORAL_TABLET | Freq: Every day | ORAL | Status: DC
  Administered 2021-08-09 – 2021-08-10 (×2): 25 ug via ORAL
  Filled 2021-08-08 (×2): qty 1

## 2021-08-08 MED ORDER — FUROSEMIDE 10 MG/ML IJ SOLN
80.0000 mg | Freq: Once | INTRAMUSCULAR | Status: AC
  Administered 2021-08-08: 80 mg via INTRAVENOUS
  Filled 2021-08-08: qty 8

## 2021-08-08 MED ORDER — ONDANSETRON HCL 4 MG/2ML IJ SOLN
4.0000 mg | Freq: Four times a day (QID) | INTRAMUSCULAR | Status: DC | PRN
  Administered 2021-08-10: 4 mg via INTRAVENOUS

## 2021-08-08 MED ORDER — ACETAMINOPHEN 325 MG PO TABS
650.0000 mg | ORAL_TABLET | Freq: Four times a day (QID) | ORAL | Status: DC | PRN
  Administered 2021-08-08 – 2021-08-09 (×3): 650 mg via ORAL
  Filled 2021-08-08 (×3): qty 2

## 2021-08-08 MED ORDER — PANTOPRAZOLE SODIUM 40 MG PO TBEC
40.0000 mg | DELAYED_RELEASE_TABLET | Freq: Every day | ORAL | Status: DC
  Administered 2021-08-09 – 2021-08-10 (×2): 40 mg via ORAL
  Filled 2021-08-08 (×2): qty 1

## 2021-08-08 MED ORDER — METHADONE HCL 10 MG/ML PO CONC: 170.0000 mg | Freq: Every day | ORAL | Status: DC

## 2021-08-08 MED ORDER — ACETAMINOPHEN 650 MG RE SUPP: 650.0000 mg | Freq: Four times a day (QID) | RECTAL | Status: DC | PRN

## 2021-08-08 MED ORDER — LABETALOL HCL 100 MG PO TABS
100.0000 mg | ORAL_TABLET | Freq: Two times a day (BID) | ORAL | Status: DC
  Administered 2021-08-08 – 2021-08-10 (×4): 100 mg via ORAL
  Filled 2021-08-08 (×4): qty 1

## 2021-08-08 MED ORDER — FERROUS SULFATE 325 (65 FE) MG PO TABS
325.0000 mg | ORAL_TABLET | Freq: Every day | ORAL | Status: DC
  Administered 2021-08-09 – 2021-08-10 (×2): 325 mg via ORAL
  Filled 2021-08-08 (×2): qty 1

## 2021-08-08 MED ORDER — SODIUM CHLORIDE 0.9 % IV SOLN
10.0000 mL/h | Freq: Once | INTRAVENOUS | Status: AC
  Administered 2021-08-08: 10 mL/h via INTRAVENOUS

## 2021-08-08 MED ORDER — ONDANSETRON HCL 4 MG PO TABS: 4.0000 mg | ORAL_TABLET | Freq: Four times a day (QID) | ORAL | Status: DC | PRN

## 2021-08-08 MED ORDER — INSULIN ASPART 100 UNIT/ML IJ SOLN
0.0000 [IU] | Freq: Three times a day (TID) | INTRAMUSCULAR | Status: DC
  Administered 2021-08-10: 3 [IU] via SUBCUTANEOUS
  Filled 2021-08-08: qty 1

## 2021-08-08 MED ORDER — NICOTINE 14 MG/24HR TD PT24
14.0000 mg | MEDICATED_PATCH | Freq: Every day | TRANSDERMAL | Status: DC
  Administered 2021-08-08 – 2021-08-10 (×3): 14 mg via TRANSDERMAL
  Filled 2021-08-08 (×3): qty 1

## 2021-08-08 MED ORDER — SODIUM CHLORIDE 0.9 % IV SOLN: 250.0000 mL | INTRAVENOUS | Status: DC | PRN

## 2021-08-08 MED ORDER — LEVOTHYROXINE SODIUM 25 MCG PO TABS: 25.0000 ug | ORAL_TABLET | Freq: Every day | ORAL | 0 refills | Status: AC

## 2021-08-08 NOTE — Telephone Encounter (Signed)
Copied from [place] [id number]. Topic: General - Other >> Aug 08, 2021  1:01 PM Tessa Lerner A wrote: Reason for CRM: The patient has called to share that they were previously directed to receive a blood transfusion   The patient would like additional information as to the specific location of where they're supposed to go at [facility]   Please contact further when possible

## 2021-08-08 NOTE — Assessment & Plan Note (Signed)
Stable Continue Synthroid 

## 2021-08-08 NOTE — Assessment & Plan Note (Signed)
Stable ?Continue Protonix ?

## 2021-08-08 NOTE — Telephone Encounter (Signed)
Pt has known ESRD

## 2021-08-08 NOTE — H&P (Addendum)
History and Physical    Patient: Nichole Wiley:[id number] DOB: 1980/07/25 DOA: 08/08/2021 DOS: the patient was seen and examined on 08/08/2021 PCP: Jearld Fenton, NP  Patient coming from: Home  Chief Complaint:  Chief Complaint  Patient presents with   Anemia   HPI: Nichole Wiley is a 41 y.o. female with medical history significant for diabetes mellitus with complications of end-stage renal disease on hemodialysis, GERD, on chronic methadone therapy, hypertension who was sent to the emergency room by her primary care provider for evaluation of symptomatic anemia. Patient states that she has had chest pain mostly at rest associated with shortness of breath but denies having any radiation of the pain and no associated diaphoresis or palpitations.  She initially thought it was due to reflux but went to see her primary care provider due to persistence of her symptoms and was noted to have a hemoglobin of 7.7 g/dl compared to baseline of 10.6 about 2 years ago. She has had a hysterectomy and no longer has periods.  She denies NSAID use and states that her stools are brown in color.  She denies having any hematochezia, hematemesis or passage of melena stools.  She denies feeling dizzy or lightheaded and denies having any falls or loss of consciousness. She denies having any fever, no chills, no cough, no leg swelling, no diaphoresis, no palpitation no focal deficit.  Review of Systems: As mentioned in the history of present illness. All other systems reviewed and are negative. Past Medical History:  Diagnosis Date   Abdominal mass    Arthritis    back   Chronic kidney disease    Colon polyp    Diabetes 1.5, managed as type 1 ([place])    Drug addiction ([place])    hx (pain pills after c-sect)   GERD (gastroesophageal reflux disease)    Hypertension    Pelvic floor dysfunction    Urine incontinence    Past Surgical History:  Procedure Laterality Date   ABDOMINAL HYSTERECTOMY  2005    CESAREAN SECTION  2003   COLONOSCOPY WITH PROPOFOL N/A 09/14/2015   Procedure: COLONOSCOPY WITH PROPOFOL;  Surgeon: Lucilla Lame, MD;  Location: [facility];  Service: Endoscopy;  Laterality: N/A;  Diabetic - insulin   CYSTOSCOPY W/ RETROGRADES Right 07/06/2017   Procedure: CYSTOSCOPY WITH RETROGRADE PYELOGRAM;  Surgeon: Hollice Espy, MD;  Location: [facility];  Service: Urology;  Laterality: Right;   CYSTOSCOPY W/ URETERAL STENT PLACEMENT Right 06/06/2017   Procedure: CYSTOSCOPY WITH RETROGRADE PYELOGRAM/URETERAL STENT PLACEMENT;  Surgeon: Lucas Mallow, MD;  Location: [facility];  Service: Urology;  Laterality: Right;   CYSTOSCOPY W/ URETERAL STENT PLACEMENT Right 07/06/2017   Procedure: CYSTOSCOPY WITH STENT REPLACEMENT;  Surgeon: Hollice Espy, MD;  Location: [facility];  Service: Urology;  Laterality: Right;   ESOPHAGOGASTRODUODENOSCOPY (EGD) WITH PROPOFOL N/A 06/19/2016   Procedure: ESOPHAGOGASTRODUODENOSCOPY (EGD) WITH PROPOFOL;  Surgeon: Jonathon Bellows, MD;  Location: [facility];  Service: Endoscopy;  Laterality: N/A;   LAPAROSCOPY     LYSIS OF ADHESION     POLYPECTOMY  09/14/2015   Procedure: POLYPECTOMY;  Surgeon: Lucilla Lame, MD;  Location: [facility];  Service: Endoscopy;;   RIGHT OOPHORECTOMY  2012   TONSILLECTOMY  1987   and adenoids   TUBAL LIGATION  2004   VULVECTOMY  2011   lipoma   Social History:  reports that she has been smoking cigarettes. She has a 12.00 pack-year smoking history. She has never used smokeless tobacco.  She reports that she does not drink alcohol and does not use drugs.  Allergies  Allergen Reactions   Cephalexin Other (See Comments) and Rash    Unknown   Penicillins Rash    Has patient had a PCN reaction causing immediate rash, facial/tongue/throat swelling, SOB or lightheadedness with hypotension: No Has patient had a PCN reaction causing severe rash involving mucus membranes or skin necrosis: No Has patient had a PCN reaction that  required hospitalization: No Has patient had a PCN reaction occurring within the last 10 years: No If all of the above answers are "NO", then may proceed with Cephalosporin use.    Family History  Problem Relation Age of Onset   Depression Mother    Diabetes Father    Heart disease Maternal Grandmother    Depression Maternal Grandmother    Dementia Maternal Grandmother    Diabetes Maternal Grandmother    Diabetes Paternal Grandmother     Prior to Admission medications   Medication Sig Start Date End Date Taking? Authorizing Provider  Accu-Chek Softclix Lancets lancets Use to check blood sugar daily for type 2 diabetes. E11.9 06/27/21   Jearld Fenton, NP  aspirin EC 81 MG tablet Take 81 mg by mouth daily.    [provider]  ferrous sulfate 325 (65 FE) MG tablet Take by mouth.    [provider]  glucose blood (ACCU-CHEK GUIDE) test strip 1 each by Other route See admin instructions. 07/08/21   Jearld Fenton, NP  labetalol (NORMODYNE) 100 MG tablet Take 1 tablet (100 mg total) by mouth 2 (two) times daily. 08/07/21   Jearld Fenton, NP  levothyroxine (SYNTHROID) 25 MCG tablet Take 1 tablet (25 mcg total) by mouth daily. 08/08/21   Jearld Fenton, NP  methadone (DOLOPHINE) 10 MG/ML solution Take 170 mg by mouth daily.    [provider]  naloxone [facility]) nasal spray 4 mg/0.1 mL One spray in either nostril once for known/suspected opioid overdose. May repeat every 2-3 minutes in alternating nostril til EMS arrives 04/29/21   [provider]  omeprazole (PRILOSEC) 20 MG capsule Take 1 capsule (20 mg total) by mouth daily. 08/07/21   Jearld Fenton, NP  sertraline (ZOLOFT) 25 MG tablet Take 1 tablet (25 mg total) by mouth daily. 08/07/21   Jearld Fenton, NP    Physical Exam: Vitals:   08/08/21 1503 08/08/21 1504 08/08/21 1745 08/08/21 1800  BP: 115/66  (!) 161/82 (!) 174/88  Pulse: 87  78 77  Resp: '18  10 14  '$ Temp: 98.3 F (36.8 C)     TempSrc:  Oral     SpO2: 100%  100% 100%  Weight:  81.6 kg    Height:  '5\' 7"'$  (1.702 m)     Physical Exam Vitals and nursing note reviewed.  Constitutional:      Appearance: Normal appearance.  HENT:     Head: Normocephalic and atraumatic.     Nose: Nose normal.     Mouth/Throat:     Mouth: Mucous membranes are moist.  Eyes:     Comments: Pale conjunctiva  Cardiovascular:     Rate and Rhythm: Normal rate and regular rhythm.  Pulmonary:     Effort: Pulmonary effort is normal.     Breath sounds: Normal breath sounds.  Abdominal:     General: Abdomen is flat. Bowel sounds are normal.     Palpations: Abdomen is soft.     Comments: PD catheter in place  Musculoskeletal:        General: Normal range of motion.     Cervical back: Normal range of motion and neck supple.  Skin:    General: Skin is warm and dry.  Neurological:     General: No focal deficit present.     Mental Status: She is alert and oriented to person, place, and time.  Psychiatric:        Mood and Affect: Mood normal.        Behavior: Behavior normal.    Data Reviewed: Relevant notes from primary care and specialist visits, past discharge summaries as available in EHR, including Care Everywhere. Prior diagnostic testing as pertinent to current admission diagnoses Updated medications and problem lists for reconciliation ED course, including vitals, labs, imaging, treatment and response to treatment Triage notes, nursing and pharmacy notes and ED provider's notes Notable results as noted in HPI Labs reviewed.  Sodium 136, potassium 3.6, chloride 108, bicarb 21, glucose 203, BUN 89, creatinine 14.24, calcium 7.7, white count 8.9, hemoglobin 6.9, hematocrit 22.2, MCV 93.3, platelet count 130 CT scan of abdomen and pelvis without contrast shows cholelithiasis without acute cholecystitis. No bowel obstruction or ileus. Aortic Atherosclerosis . EKG still pending There are no new results to review at this time.  Assessment and  Plan: * Symptomatic anemia Patient presents for evaluation of chest pain and shortness of breath mostly at rest Noted to have a drop in her hemoglobin to 6.9g/d Unclear etiology for patient's anemia but may be related to her end-stage renal disease Hold aspirin We will transfuse 1 unit of packed RBC We will give Lasix 80 mg IV push posttransfusion  ESRD (end stage renal disease) ([place]) Patient has a history of end-stage renal disease and is on peritoneal dialysis Nephrology consult for renal replacement therapy during this hospitalization  Type 1 diabetes mellitus without complication ([place]) With complications of end-stage renal disease on hemodialysis Maintain consistent carbohydrate diet Glycemic control with sliding scale insulin  GERD (gastroesophageal reflux disease) Stable Continue Protonix  Depression Stable Continue Zoloft  Hypothyroidism Stable Continue Synthroid  Nicotine dependence Smoking cessation was discussed with patient in detail We will place patient on a nicotine transdermal patch  HTN (hypertension) Continue labetalol for blood pressure control      Advance Care Planning:   Code Status: Full Code   Consults: Nephrology  Family Communication: Greater than 50% of time was spent discussing patient's condition and plan of care with her at the bedside.  All questions and concerns have been addressed.  She verbalizes understanding and agrees with the plan.  Severity of Illness: The appropriate patient status for this patient is OBSERVATION. Observation status is judged to be reasonable and necessary in order to provide the required intensity of service to ensure the patient's safety. The patient's presenting symptoms, physical exam findings, and initial radiographic and laboratory data in the context of their medical condition is felt to place them at decreased risk for further clinical deterioration. Furthermore, it is anticipated that the patient will be  medically stable for discharge from the hospital within 2 midnights of admission.   Author: Collier Bullock, MD 08/08/2021 7:55 PM  For on call review [email or web address].

## 2021-08-08 NOTE — Assessment & Plan Note (Signed)
With complications of end-stage renal disease on hemodialysis Maintain consistent carbohydrate diet Glycemic control with sliding scale insulin

## 2021-08-08 NOTE — Assessment & Plan Note (Signed)
Smoking cessation was discussed with patient in detail We will place patient on a nicotine transdermal patch. The patient is strongly advised not to resume tobacco use upon discharge to home.

## 2021-08-08 NOTE — ED Notes (Addendum)
Pt was sent by her primary care doctor due to pt's hemoglobin being 7.7. Pt states for the past 2 weeks she has been having weakness, SOB, and chest pain. Pt denies feeling SOB and having chest pain at this time. Pt's skin color appears pale.   Pt does have a peritoneal dialysis port. Pt states she does peritoneal dialysis at home and does dialysis as needed.

## 2021-08-08 NOTE — Telephone Encounter (Signed)
Call received from [facility] at [facility] to report stat lab results,  08/07/21 creatinine level 14.15. results can be viewed in chart. FC Rachael notified results being sent to practice.

## 2021-08-08 NOTE — Assessment & Plan Note (Signed)
-  Stable -Continue Zoloft 

## 2021-08-08 NOTE — Assessment & Plan Note (Addendum)
Patient presents for evaluation of chest pain and shortness of breath mostly at rest Noted to have a drop in her hemoglobin to 6.9g/d Unclear etiology for patient's anemia but may be related to her end-stage renal disease Hold aspirin We will transfuse 1 unit of packed RBC We will give Lasix 80 mg IV push posttransfusion

## 2021-08-08 NOTE — Assessment & Plan Note (Signed)
Continue labetalol for blood pressure control

## 2021-08-08 NOTE — Addendum Note (Signed)
Addended by: Jearld Fenton on: 08/08/2021 07:05 AM   Modules accepted: Orders

## 2021-08-08 NOTE — Telephone Encounter (Signed)
Pt advised.   Thanks,   -Dayzee Trower

## 2021-08-08 NOTE — ED Notes (Signed)
Paper Blood Consent obtained, see paper chart.

## 2021-08-08 NOTE — Telephone Encounter (Signed)
She needs to go to the Emergency Room

## 2021-08-08 NOTE — Assessment & Plan Note (Addendum)
Patient has a history of end-stage renal disease and is on peritoneal dialysis Nephrology consult for renal replacement via peritoneal diaysis during this hospitalization. The patient refused PD yesterday. Nephrology aware.

## 2021-08-08 NOTE — ED Triage Notes (Signed)
Pt was sent here by her provider at [facility]. Pt's hgb was 7.7 that was collected yesterday. Pt denies pain or any other symptoms.

## 2021-08-08 NOTE — ED Provider Notes (Signed)
[facility] Provider Note    Event Date/Time   First MD Initiated Contact with Patient 08/08/21 1648     (approximate)   History   Chief Complaint: Anemia   HPI  Nichole Wiley is a 41 y.o. female with a history of GERD hypertension ESRD on peritoneal dialysis who comes the ED due to being found to have a hemoglobin of 7.7 on outpatient labs obtained yesterday by her PCP.  She does report feeling cold and fatigued for several weeks.  Also has daily vomiting and constipation which have been going on for many months.  No fever chest pain shortness of breath palpitations or syncope     Physical Exam   Triage Vital Signs: ED Triage Vitals  Enc Vitals Group     BP 08/08/21 1503 115/66     Pulse Rate 08/08/21 1503 87     Resp 08/08/21 1503 18     Temp 08/08/21 1503 98.3 F (36.8 C)     Temp Source 08/08/21 1503 Oral     SpO2 08/08/21 1503 100 %     Weight 08/08/21 1504 180 lb (81.6 kg)     Height 08/08/21 1504 '5\' 7"'$  (1.702 m)     Head Circumference --      Peak Flow --      Pain Score 08/08/21 1504 0     Pain Loc --      Pain Edu? --      Excl. in [place]? --     Most recent vital signs: Vitals:   08/08/21 1745 08/08/21 1800  BP: (!) 161/82 (!) 174/88  Pulse: 78 77  Resp: 10 14  Temp:    SpO2: 100% 100%    General: Awake, no distress.  CV:  Good peripheral perfusion.  Resp:  Normal effort.  Abd:  No distention.  Soft and nontender.  PD catheter site noninflamed.  Patient refuses rectal exam Other:  No lower extremity edema.   ED Results / Procedures / Treatments   Labs (all labs ordered are listed, but only abnormal results are displayed) Labs Reviewed  CBC - Abnormal; Notable for the following components:      Result Value   RBC 2.38 (*)    Hemoglobin 6.9 (*)    HCT 22.2 (*)    Platelets 130 (*)    All other components within normal limits  BASIC METABOLIC PANEL - Abnormal; Notable for the following components:   CO2 21 (*)     Glucose, Bld 203 (*)    BUN 89 (*)    Creatinine, Ser 14.24 (*)    Calcium 7.7 (*)    GFR, Estimated 3 (*)    All other components within normal limits  PROTIME-INR  APTT  IRON AND TIBC  HIV ANTIBODY (ROUTINE TESTING W REFLEX)  CBC  BASIC METABOLIC PANEL  TYPE AND SCREEN  PREPARE RBC (CROSSMATCH)  ABO/RH  TROPONIN I (HIGH SENSITIVITY)     EKG    RADIOLOGY CT abdomen pelvis viewed and interpreted by me, no free air, no intra-abdominal abscess, no bowel obstruction.  Radiology report reviewed.   PROCEDURES:  .Critical Care Performed by: Carrie Mew, MD Authorized by: Carrie Mew, MD   Critical care provider statement:    Critical care time (minutes):  35   Critical care time was exclusive of:  Separately billable procedures and treating other patients   Critical care was necessary to treat or prevent imminent or life-threatening deterioration of the following  conditions:  Circulatory failure and renal failure   Critical care was time spent personally by me on the following activities:  Development of treatment plan with patient or surrogate, discussions with consultants, evaluation of patient's response to treatment, examination of patient, obtaining history from patient or surrogate, ordering and performing treatments and interventions, ordering and review of laboratory studies, ordering and review of radiographic studies, pulse oximetry, re-evaluation of patient's condition and review of old charts   Care discussed with: admitting provider     [place] ED: Medications  0.9 %  sodium chloride infusion (has no administration in time range)  methadone (DOLOPHINE) 10 MG/ML solution 170 mg (has no administration in time range)  labetalol (NORMODYNE) tablet 100 mg (has no administration in time range)  sertraline (ZOLOFT) tablet 25 mg (has no administration in time range)  levothyroxine (SYNTHROID) tablet 25 mcg (has no administration in time range)   pantoprazole (PROTONIX) EC tablet 40 mg (has no administration in time range)  ferrous sulfate tablet 325 mg (has no administration in time range)  sodium chloride flush (NS) 0.9 % injection 3 mL (has no administration in time range)  sodium chloride flush (NS) 0.9 % injection 3 mL (has no administration in time range)  0.9 %  sodium chloride infusion (has no administration in time range)  acetaminophen (TYLENOL) tablet 650 mg (has no administration in time range)    Or  acetaminophen (TYLENOL) suppository 650 mg (has no administration in time range)  ondansetron (ZOFRAN) tablet 4 mg (has no administration in time range)    Or  ondansetron (ZOFRAN) injection 4 mg (has no administration in time range)  furosemide (LASIX) injection 80 mg (has no administration in time range)  insulin aspart (novoLOG) injection 0-15 Units (has no administration in time range)  nicotine (NICODERM CQ - dosed in mg/24 hours) patch 14 mg (has no administration in time range)     IMPRESSION / MDM / [place] / ED COURSE  I reviewed the triage vital signs and the nursing notes.                              Differential diagnosis includes, but is not limited to, GI bleed, hematopoietic failure due to renal failure, symptomatic anemia, electrolyte abnormality, bowel obstruction  Patient's presentation is most consistent with acute presentation with potential threat to life or bodily function.  Patient presents with fatigue and persistent chills, found to have a hemoglobin of 6.9 today compared to 7.7 yesterday and a baseline of about 9.  This is suspicious for acute blood loss with symptomatic anemia, though patient refuses rectal exam.  We will need to transfuse 1 unit RBCs.  Because of her renal failure and elevated risk for volume overload and pulmonary edema, will need to hospitalize for continued observation, diuresis or respiratory support as needed.  Case discussed with hospitalist.        FINAL CLINICAL IMPRESSION(S) / ED DIAGNOSES   Final diagnoses:  Symptomatic anemia  ESRD on peritoneal dialysis [facility])     Rx / DC Orders   ED Discharge Orders     None        Note:  This document was prepared using Dragon voice recognition software and may include unintentional dictation errors.   Carrie Mew, MD 08/08/21 2004

## 2021-08-09 DIAGNOSIS — Z79890 Hormone replacement therapy (postmenopausal): Secondary | ICD-10-CM | POA: Diagnosis not present

## 2021-08-09 DIAGNOSIS — Z833 Family history of diabetes mellitus: Secondary | ICD-10-CM | POA: Diagnosis not present

## 2021-08-09 DIAGNOSIS — K3184 Gastroparesis: Secondary | ICD-10-CM | POA: Diagnosis present

## 2021-08-09 DIAGNOSIS — E039 Hypothyroidism, unspecified: Secondary | ICD-10-CM | POA: Diagnosis present

## 2021-08-09 DIAGNOSIS — I120 Hypertensive chronic kidney disease with stage 5 chronic kidney disease or end stage renal disease: Secondary | ICD-10-CM | POA: Diagnosis present

## 2021-08-09 DIAGNOSIS — E1043 Type 1 diabetes mellitus with diabetic autonomic (poly)neuropathy: Secondary | ICD-10-CM | POA: Diagnosis present

## 2021-08-09 DIAGNOSIS — Z79891 Long term (current) use of opiate analgesic: Secondary | ICD-10-CM | POA: Diagnosis not present

## 2021-08-09 DIAGNOSIS — D649 Anemia, unspecified: Secondary | ICD-10-CM | POA: Diagnosis not present

## 2021-08-09 DIAGNOSIS — Z881 Allergy status to other antibiotic agents status: Secondary | ICD-10-CM | POA: Diagnosis not present

## 2021-08-09 DIAGNOSIS — F17210 Nicotine dependence, cigarettes, uncomplicated: Secondary | ICD-10-CM | POA: Diagnosis present

## 2021-08-09 DIAGNOSIS — F32A Depression, unspecified: Secondary | ICD-10-CM | POA: Diagnosis present

## 2021-08-09 DIAGNOSIS — Z992 Dependence on renal dialysis: Secondary | ICD-10-CM | POA: Diagnosis not present

## 2021-08-09 DIAGNOSIS — D631 Anemia in chronic kidney disease: Secondary | ICD-10-CM | POA: Diagnosis present

## 2021-08-09 DIAGNOSIS — M479 Spondylosis, unspecified: Secondary | ICD-10-CM | POA: Diagnosis present

## 2021-08-09 DIAGNOSIS — Z880 Allergy status to penicillin: Secondary | ICD-10-CM | POA: Diagnosis not present

## 2021-08-09 DIAGNOSIS — N186 End stage renal disease: Secondary | ICD-10-CM | POA: Diagnosis present

## 2021-08-09 DIAGNOSIS — Z79899 Other long term (current) drug therapy: Secondary | ICD-10-CM | POA: Diagnosis not present

## 2021-08-09 DIAGNOSIS — Z818 Family history of other mental and behavioral disorders: Secondary | ICD-10-CM | POA: Diagnosis not present

## 2021-08-09 DIAGNOSIS — Z7982 Long term (current) use of aspirin: Secondary | ICD-10-CM | POA: Diagnosis not present

## 2021-08-09 DIAGNOSIS — E1022 Type 1 diabetes mellitus with diabetic chronic kidney disease: Secondary | ICD-10-CM | POA: Diagnosis present

## 2021-08-09 DIAGNOSIS — K219 Gastro-esophageal reflux disease without esophagitis: Secondary | ICD-10-CM | POA: Diagnosis present

## 2021-08-09 DIAGNOSIS — N2581 Secondary hyperparathyroidism of renal origin: Secondary | ICD-10-CM | POA: Diagnosis present

## 2021-08-09 DIAGNOSIS — Z8249 Family history of ischemic heart disease and other diseases of the circulatory system: Secondary | ICD-10-CM | POA: Diagnosis not present

## 2021-08-09 LAB — HEMOGLOBIN AND HEMATOCRIT, BLOOD
HCT: 24.8 % — ABNORMAL LOW (ref 36.0–46.0)
HCT: 29.3 % — ABNORMAL LOW (ref 36.0–46.0)
Hemoglobin: 7.8 g/dL — ABNORMAL LOW (ref 12.0–15.0)
Hemoglobin: 9.1 g/dL — ABNORMAL LOW (ref 12.0–15.0)

## 2021-08-09 LAB — HIV ANTIBODY (ROUTINE TESTING W REFLEX): HIV Screen 4th Generation wRfx: NONREACTIVE

## 2021-08-09 LAB — RETICULOCYTES
Immature Retic Fract: 10.3 % (ref 2.3–15.9)
RBC.: 2.73 MIL/uL — ABNORMAL LOW (ref 3.87–5.11)
Retic Count, Absolute: 50.2 10*3/uL (ref 19.0–186.0)
Retic Ct Pct: 1.8 % (ref 0.4–3.1)

## 2021-08-09 LAB — BASIC METABOLIC PANEL
Anion gap: 8 (ref 5–15)
BUN: 93 mg/dL — ABNORMAL HIGH (ref 6–20)
CO2: 21 mmol/L — ABNORMAL LOW (ref 22–32)
Calcium: 7.6 mg/dL — ABNORMAL LOW (ref 8.9–10.3)
Chloride: 109 mmol/L (ref 98–111)
Creatinine, Ser: 14.08 mg/dL — ABNORMAL HIGH (ref 0.44–1.00)
GFR, Estimated: 3 mL/min — ABNORMAL LOW (ref >60)
Glucose, Bld: 117 mg/dL — ABNORMAL HIGH (ref 70–99)
Potassium: 3.8 mmol/L (ref 3.5–5.1)
Sodium: 138 mmol/L (ref 135–145)

## 2021-08-09 LAB — TYPE AND SCREEN
ABO/RH(D): A POS
Antibody Screen: NEGATIVE
Unit division: 0

## 2021-08-09 LAB — BPAM RBC
Blood Product Expiration Date: 202306162359
ISSUE DATE / TIME: 202306012033
Unit Type and Rh: 6200

## 2021-08-09 LAB — GLUCOSE, CAPILLARY
Glucose-Capillary: 100 mg/dL — ABNORMAL HIGH (ref 70–99)
Glucose-Capillary: 103 mg/dL — ABNORMAL HIGH (ref 70–99)
Glucose-Capillary: 108 mg/dL — ABNORMAL HIGH (ref 70–99)
Glucose-Capillary: 135 mg/dL — ABNORMAL HIGH (ref 70–99)

## 2021-08-09 LAB — IRON AND TIBC
Iron: 90 ug/dL (ref 28–170)
Saturation Ratios: 47 % — ABNORMAL HIGH (ref 10.4–31.8)
TIBC: 193 ug/dL — ABNORMAL LOW (ref 250–450)
UIBC: 103 ug/dL

## 2021-08-09 LAB — LACTATE DEHYDROGENASE: LDH: 217 U/L — ABNORMAL HIGH (ref 98–192)

## 2021-08-09 LAB — TROPONIN I (HIGH SENSITIVITY): Troponin I (High Sensitivity): 2793 ng/L (ref <18)

## 2021-08-09 LAB — HEPATIC FUNCTION PANEL
ALT: 15 U/L (ref 0–44)
AST: 13 U/L — ABNORMAL LOW (ref 15–41)
Albumin: 2.6 g/dL — ABNORMAL LOW (ref 3.5–5.0)
Alkaline Phosphatase: 61 U/L (ref 38–126)
Bilirubin, Direct: <0.1 mg/dL (ref 0.0–0.2)
Total Bilirubin: 0.3 mg/dL (ref 0.3–1.2)
Total Protein: 5.5 g/dL — ABNORMAL LOW (ref 6.5–8.1)

## 2021-08-09 LAB — CBC
HCT: 22.9 % — ABNORMAL LOW (ref 36.0–46.0)
Hemoglobin: 7.2 g/dL — ABNORMAL LOW (ref 12.0–15.0)
MCH: 28.2 pg (ref 26.0–34.0)
MCHC: 31.4 g/dL (ref 30.0–36.0)
MCV: 89.8 fL (ref 80.0–100.0)
Platelets: 107 10*3/uL — ABNORMAL LOW (ref 150–400)
RBC: 2.55 MIL/uL — ABNORMAL LOW (ref 3.87–5.11)
RDW: 15.7 % — ABNORMAL HIGH (ref 11.5–15.5)
WBC: 7.4 10*3/uL (ref 4.0–10.5)
nRBC: 0 % (ref 0.0–0.2)

## 2021-08-09 LAB — FOLATE: Folate: 8.5 ng/mL (ref >5.9)

## 2021-08-09 LAB — FERRITIN: Ferritin: 311 ng/mL — ABNORMAL HIGH (ref 11–307)

## 2021-08-09 MED ORDER — GENTAMICIN SULFATE 0.1 % EX CREA
1.0000 "application " | TOPICAL_CREAM | Freq: Every day | CUTANEOUS | Status: DC
  Administered 2021-08-09 – 2021-08-10 (×2): 1 via TOPICAL
  Filled 2021-08-09 (×3): qty 15

## 2021-08-09 MED ORDER — DIPHENHYDRAMINE HCL 12.5 MG/5ML PO ELIX
12.5000 mg | ORAL_SOLUTION | Freq: Once | ORAL | Status: DC
  Filled 2021-08-09: qty 5

## 2021-08-09 MED ORDER — DELFLEX-LC/2.5% DEXTROSE 394 MOSM/L IP SOLN: INTRAPERITONEAL | Status: DC

## 2021-08-09 MED ORDER — HEPARIN 1000 UNIT/ML FOR PERITONEAL DIALYSIS
INTRAPERITONEAL | Status: DC | PRN
  Filled 2021-08-09 (×2): qty 3000

## 2021-08-09 MED ORDER — METHADONE HCL 10 MG/ML PO CONC
160.0000 mg | Freq: Every day | ORAL | Status: DC
  Administered 2021-08-09 – 2021-08-10 (×2): 160 mg via ORAL
  Filled 2021-08-09 (×2): qty 20

## 2021-08-09 MED ORDER — EPOETIN ALFA 40000 UNIT/ML IJ SOLN
20000.0000 [IU] | INTRAMUSCULAR | Status: DC
  Administered 2021-08-09: 20000 [IU] via SUBCUTANEOUS
  Filled 2021-08-09: qty 1

## 2021-08-09 MED ORDER — HEPARIN 1000 UNIT/ML FOR PERITONEAL DIALYSIS
INTRAPERITONEAL | Status: DC | PRN
  Filled 2021-08-09 (×2): qty 6000

## 2021-08-09 NOTE — Progress Notes (Signed)
Received ordered for PD treatment and machine set and connected to patient. Patient then verbalized that she experience severe pain with initial drainage, Dr. Holley Raring made aware and ordered to with each fill to drain 80% and keep 20% of fluids. New bags of fluids and setup done, patient then refused treatment due to pain. Alternative pain relief discussed such as getting order for pain medication, patient refused. Patient refused peritoneal dialysis treatment tonight, Dr. Holley Raring made aware.

## 2021-08-09 NOTE — Progress Notes (Signed)
Patient  was out walking around in the lobby area near the coffee shop. She was told she could not leave the floor again due to her past history and for her safety, she acknowledge understanding and states she will comply with the policy.

## 2021-08-09 NOTE — Progress Notes (Signed)
Pharmacy verification of Methadone dose  Called patient's listed Methadone clinic on 08/09/21:  [facility] maintenance. [place], [place]  Clinic states patient's current dose is Methadone 160 mg po Daily and patient was at clinic on 08/08/21   Chinita Greenland PharmD Clinical Pharmacist 08/09/2021

## 2021-08-09 NOTE — Progress Notes (Signed)
Inpatient Diabetes Program Recommendations  AACE/ADA: New Consensus Statement on Inpatient Glycemic Control (2015)  Target Ranges:  Prepandial:   less than 140 mg/dL      Peak postprandial:   less than 180 mg/dL (1-2 hours)      Critically ill patients:  140 - 180 mg/dL   Lab Results  Component Value Date   GLUCAP 103 (H) 08/09/2021   HGBA1C 5 04/27/2021    Review of Glycemic Control  Latest Reference Range & Units 08/09/21 08:05 08/09/21 08:31  Glucose-Capillary 70 - 99 mg/dL 100 (H) 103 (H)   Diabetes history: DM (states 1.5 however patient not taking insulin) Outpatient Diabetes medications: None Current orders for Inpatient glycemic control:  Novolog moderate tid with meals  Inpatient Diabetes Program Recommendations:    Please reduce Novolog correction to "very sensitive" (0-6 units) tid with meals.  Patient is documented as DM 1.5 however she does not taking insulin currently and last A1C was 5%.  She has hx. Of ESRD and is on PD. Will follow.  Thanks, Adah Perl, RN, BC-ADM Inpatient Diabetes Coordinator Pager [phone]  (8a-5p)

## 2021-08-09 NOTE — Progress Notes (Signed)
PROGRESS NOTE  Nichole Wiley RXY:[id number] DOB: April 30, 1980 DOA: 08/08/2021 PCP: Nichole Fenton, NP  Brief History   Nichole Wiley is a 41 y.o. female with medical history significant for diabetes mellitus with complications of end-stage renal disease on hemodialysis, GERD, on chronic methadone therapy, hypertension who was sent to the emergency room by her primary care provider for evaluation of symptomatic anemia. Patient states that she has had chest pain mostly at rest associated with shortness of breath but denies having any radiation of the pain and no associated diaphoresis or palpitations.  She initially thought it was due to reflux but went to see her primary care provider due to persistence of her symptoms and was noted to have a hemoglobin of 7.7 g/dl compared to baseline of 10.6 about 2 years ago. She has had a hysterectomy and no longer has periods.  She denies NSAID use and states that her stools are brown in color.  She denies having any hematochezia, hematemesis or passage of melena stools.  She denies feeling dizzy or lightheaded and denies having any falls or loss of consciousness. She denies having any fever, no chills, no cough, no leg swelling, no diaphoresis, no palpitation no focal deficit.   Review of Systems: As mentioned in the history of present illness. All other systems reviewed and are negative.  [facility] were consulted to admit the patient for further evaluation and care.  Consultants    Procedures  None  Antibiotics   Anti-infectives (From admission, onward)    None      Subjective  The patient is resting comfortably. No new complaints.   Objective   Vitals:  Vitals:   08/09/21 0518 08/09/21 1425  BP: (!) 143/78 (!) 145/77  Pulse: 79 81  Resp: 16   Temp: 97.8 F (36.6 C) 98 F (36.7 C)  SpO2: 100% 100%    Exam:  Constitutional:  The patient is awake, alert, and oriented x 3. No acute distress. Respiratory:  No increased  work of breathing. No wheezes, rales, or rhonchi No tactile fremitus Cardiovascular:  Regular rate and rhythm No murmurs, ectopy, or gallups. No lateral PMI. No thrills. Abdomen:  Abdomen is soft, non-tender, non-distended No hernias, masses, or organomegaly Normoactive bowel sounds.  Musculoskeletal:  No cyanosis, clubbing, or edema Skin:  No rashes, lesions, ulcers palpation of skin: no induration or nodules Neurologic:  CN 2-12 intact Sensation all 4 extremities intact Psychiatric:  Mental status Mood, affect appropriate Orientation to person, place, time  judgment and insight appear intact   I have personally reviewed the following:   Today's Data  Vitals  Lab Data  CBC CMP Troponin  Micro Data  HIV  - no reactive  Imaging  CT abdomen and pelvis  Cardiology Data  EKG  Other Data    Scheduled Meds:  diphenhydrAMINE  12.5 mg Oral Once   epoetin (EPOGEN/PROCRIT) injection  20,000 Units Subcutaneous Weekly   ferrous sulfate  325 mg Oral Q breakfast   insulin aspart  0-15 Units Subcutaneous TID WC   labetalol  100 mg Oral BID   levothyroxine  25 mcg Oral Daily   methadone  160 mg Oral Daily   nicotine  14 mg Transdermal Daily   pantoprazole  40 mg Oral Daily   sertraline  25 mg Oral Daily   sodium chloride flush  3 mL Intravenous Q12H   Continuous Infusions:  sodium chloride      Principal Problem:   Symptomatic anemia Active  Problems:   ESRD (end stage renal disease) ([place])   Type 1 diabetes mellitus without complication (HCC)   GERD (gastroesophageal reflux disease)   HTN (hypertension)   Nicotine dependence   Hypothyroidism   Depression   LOS: 0 days   A & P  Symptomatic anemia Patient presents for evaluation of chest pain and shortness of breath mostly at rest Noted to have a drop in her hemoglobin to 6.9g/d Unclear etiology for patient's anemia but may be related to her end-stage renal disease Hold aspirin The patient received 1  unit PRBC's in transfusion. Hemoglobin this am is 7.2. Will continue to follow H&H.  FOBT has been ordered. LDH is only slightly elevated over normal. Total bilirubin as well as direct bilirubin and indirect bilirubin is normal. Anemia panel is pending. More than likely her anemia is due to her ESRD. Will rule out other reversible causes. Transfuse for Hgb 7.0 or less.  ESRD (end stage renal disease) ([place]) Patient has a history of end-stage renal disease and is on peritoneal dialysis Nephrology has been consulted and will manage PD.   Type 1 diabetes mellitus without complication (HCC) With complications of end-stage renal disease on hemodialysis Maintain consistent carbohydrate diet Glycemic control with sliding scale insulin   GERD (gastroesophageal reflux disease) Stable Continue Protonix   Depression Stable Continue Zoloft   Hypothyroidism Stable Continue Synthroid   Nicotine dependence Smoking cessation was discussed with patient in detail We will place patient on a nicotine transdermal patch   HTN (hypertension) Continue labetalol for blood pressure control   I have seen and examined this patient myself. I have spent 36 minutes in her evaluation and care.    Advance Care Planning:   Code Status: Full Code    Consults: Nephrology   Family Communication: Greater than 50% of time was spent discussing patient's condition and plan of care with her at the bedside.  All questions and concerns have been addressed.  She verbalizes understanding and agrees with the plan.  Disposition Plan: Home    Nichole Bohac, DO [facility] Direct contact: see [email or web address]  7PM-7AM contact night coverage as above 08/09/2021, 3:33 PM  LOS: 0 days

## 2021-08-09 NOTE — Progress Notes (Addendum)
[facility]  ROUNDING NOTE   Subjective:   Nichole Wiley is a 41 year old female with past medical conditions including diabetes, GERD, hypertension, and end-stage renal disease on peritoneal dialysis.  Patient presents to the emergency department from her primary care physician for evaluation of anemia.  Patient was admitted for ESRD on peritoneal dialysis ([place]) [N18.6, Z99.2] Symptomatic anemia [D64.9] . Patient is known to our practice and is followed by [name].  Patient has been lost to follow-up due to multiple missed appointments, lab checkups.  Patient seen sitting up in bed today, female friend laying in recliner at bedside.  Patient states she has been well, intermittently does peritoneal dialysis treatments at home.  Patient does voice concern of the relationship that she is and and states she is limited on what doctors visits she can attend.  She shares these concerns without found, just mouthing the words and pointing to the female lying in bed beside her.  It is interpreted that she says he is a reason she misses her appointments.  She currently denies pain or discomfort from peritoneal dialysis site.  States she does continue to make urine.  Denies nausea, vomiting, or diarrhea.  Does report shortness of breath with exertion.  Intermittent chest discomfort without radiation.  Denies NSAID use.  Denies dark or bloody stools.  Labs on arrival include bicarb 21, glucose 203, BUN 89, creatinine 14.24, calcium 7.7, hemoglobin 6.9.  CT abdomen pelvis negative for acute changes.  We have been consulted to manage dialysis needs.   Objective:  Vital signs in last 24 hours:  Temp:  [97.8 F (36.6 C)-98.5 F (36.9 C)] 97.8 F (36.6 C) (06/02 0518) Pulse Rate:  [77-87] 79 (06/02 0518) Resp:  [10-19] 16 (06/02 0518) BP: (115-174)/(66-88) 143/78 (06/02 0518) SpO2:  [96 %-100 %] 100 % (06/02 0518) Weight:  [81.6 kg] 81.6 kg (06/01 1504)  Weight change:  Filed Weights    08/08/21 1504  Weight: 81.6 kg    Intake/Output: I/O last 3 completed shifts: In: 288 [Blood:288] Out: -    Intake/Output this shift:  No intake/output data recorded.  Physical Exam: General: NAD, sitting up in bed  Head: Normocephalic, atraumatic. Moist oral mucosal membranes  Eyes: Anicteric  Lungs:  Clear to auscultation, normal effort, room air  Heart: Regular rate and rhythm  Abdomen:  Soft, nontender, PD catheter  Extremities: 2+ peripheral edema.  Neurologic: Nonfocal, moving all four extremities  Skin: No lesions  Access: PD catheter    Basic Metabolic Panel: Recent Labs  Lab 08/07/21 1110 08/08/21 1509 08/09/21 0423  NA 138 136 138  K 4.0 3.6 3.8  CL 105 108 109  CO2 21 21* 21*  GLUCOSE 199* 203* 117*  BUN 87* 89* 93*  CREATININE 14.15* 14.24* 14.08*  CALCIUM 7.9* 7.7* 7.6*    Liver Function Tests: Recent Labs  Lab 08/07/21 1110 08/09/21 0936  AST 10 13*  ALT 14 15  ALKPHOS  --  61  BILITOT 0.2 0.3  PROT 4.8* 5.5*  ALBUMIN  --  2.6*   No results for input(s): LIPASE, AMYLASE in the last 168 hours. No results for input(s): AMMONIA in the last 168 hours.  CBC: Recent Labs  Lab 08/07/21 1110 08/08/21 1509 08/09/21 0423  WBC 9.7 8.9 7.4  HGB 7.7* 6.9* 7.2*  HCT 24.1* 22.2* 22.9*  MCV 93.8 93.3 89.8  PLT 150 130* 107*    Cardiac Enzymes: No results for input(s): CKTOTAL, CKMB, CKMBINDEX, TROPONINI in the  last 168 hours.  BNP: Invalid input(s): POCBNP  CBG: Recent Labs  Lab 08/08/21 2056 08/08/21 2247 08/09/21 0805 08/09/21 0831 08/09/21 1138  GLUCAP 140* 147* 100* 103* 108*    Microbiology: Results for orders placed or performed in visit on 06/25/17  Microscopic Examination     Status: Abnormal   Collection Time: 06/25/17 10:15 AM   URINE  Result Value Ref Range Status   WBC, UA 11-30 (A) 0 - 5 /hpf Final   RBC, UA 3-10 (A) 0 - 2 /hpf Final   Epithelial Cells (non renal) 0-10 0 - 10 /hpf Final   Casts Present (A) None  seen /lpf Final   Cast Type Hyaline casts N/A Final   Mucus, UA Present (A) Not Estab. Final   Bacteria, UA Moderate (A) None seen/Few Final  CULTURE, URINE COMPREHENSIVE     Status: None   Collection Time: 06/25/17 12:16 PM   Specimen: Urine   BL  Result Value Ref Range Status   Urine Culture, Comprehensive Final report  Final   Organism ID, Bacteria Comment  Final    Comment: Mixed urogenital flora Greater than 100,000 colony forming units per mL     Coagulation Studies: Recent Labs    08/08/21 1745  LABPROT 14.0  INR 1.1    Urinalysis: No results for input(s): COLORURINE, LABSPEC, PHURINE, GLUCOSEU, HGBUR, BILIRUBINUR, KETONESUR, PROTEINUR, UROBILINOGEN, NITRITE, LEUKOCYTESUR in the last 72 hours.  Invalid input(s): APPERANCEUR    Imaging: CT ABDOMEN PELVIS WO CONTRAST  Result Date: 08/08/2021 CLINICAL DATA:  Nausea and vomiting, anemia EXAM: CT ABDOMEN AND PELVIS WITHOUT CONTRAST TECHNIQUE: Multidetector CT imaging of the abdomen and pelvis was performed following the standard protocol without IV contrast. Unenhanced CT was performed per clinician order. Lack of IV contrast limits sensitivity and specificity, especially for evaluation of abdominal/pelvic solid viscera. RADIATION DOSE REDUCTION: This exam was performed according to the departmental dose-optimization program which includes automated exposure control, adjustment of the mA and/or kV according to patient size and/or use of iterative reconstruction technique. COMPARISON:  06/06/2017 FINDINGS: Lower chest: No acute pleural or parenchymal lung disease. Hepatobiliary: Small gallstones within the gallbladder. No evidence of acute cholecystitis. Unremarkable unenhanced appearance of the liver. Pancreas: Unremarkable unenhanced appearance. Spleen: Unremarkable unenhanced appearance. Adrenals/Urinary Tract: No urinary tract calculi or obstructive uropathy within either kidney. The adrenals and bladder are unremarkable.  Stomach/Bowel: No bowel obstruction or ileus. Normal retrocecal appendix. No bowel wall thickening or inflammatory change. Vascular/Lymphatic: Aortic atherosclerosis. No enlarged abdominal or pelvic lymph nodes. Reproductive: Status post hysterectomy. No adnexal masses. Other: Trace pelvic free fluid. No free intraperitoneal gas. Peritoneal dialysis catheter coiled within the lower pelvis. No abdominal wall hernia. Musculoskeletal: No acute or destructive bony lesions. Reconstructed images demonstrate no additional findings. IMPRESSION: 1. Cholelithiasis without acute cholecystitis. 2. No bowel obstruction or ileus. 3.  Aortic Atherosclerosis (ICD10-I70.0). Electronically Signed   By: Randa Ngo M.D.   On: 08/08/2021 18:15     Medications:    sodium chloride      diphenhydrAMINE  12.5 mg Oral Once   epoetin (EPOGEN/PROCRIT) injection  20,000 Units Subcutaneous Weekly   ferrous sulfate  325 mg Oral Q breakfast   insulin aspart  0-15 Units Subcutaneous TID WC   labetalol  100 mg Oral BID   levothyroxine  25 mcg Oral Daily   methadone  160 mg Oral Daily   nicotine  14 mg Transdermal Daily   pantoprazole  40 mg Oral Daily   sertraline  25 mg Oral Daily   sodium chloride flush  3 mL Intravenous Q12H   sodium chloride, acetaminophen **OR** acetaminophen, ondansetron **OR** ondansetron (ZOFRAN) IV, sodium chloride flush  Assessment/ Plan:  Ms. KOURTNI STINEMAN is a 41 y.o.  female with past medical conditions including diabetes, GERD, hypertension, and end-stage renal disease on peritoneal dialysis.  Patient presents to the emergency department from her primary care physician for evaluation of anemia.  Patient was admitted for ESRD on peritoneal dialysis ([place]) [N18.6, Z99.2] Symptomatic anemia [D64.9]  [facility]  End-stage renal disease on peritoneal dialysis.  Will maintain nightly schedule during this admission.  Due to bilateral lower extremity edema, will use 2.5% dialysate  for 2 L fills, 4 cycles, 9-hour treatment.  Patient states she does not perform regular treatments at home, therefore we will heparinize dialysate for treatment.  We will monitor UF and weight.  2. Anemia of chronic kidney disease Lab Results  Component Value Date   HGB 7.2 (L) 08/09/2021    Admitted for symptomatic anemia.  Admission hemoglobin 6.9, patient has received blood transfusion during this admission.  Work-up remains in progress.  Will prescribe subcu EPO 20,000 units weekly.  3. Secondary Hyperparathyroidism:  Lab Results  Component Value Date   CALCIUM 7.6 (L) 08/09/2021   PHOS 5.2 (H) 11/23/2018    Calcium decreased however phosphorus at goal.  We will continue to monitor and may supplement tomorrow.  4.  Hypertension with chronic kidney disease.  Home regimen includes labetalol.  Currently prescribed.  Blood pressure 143/78, acceptable for this patient.   LOS: 0 Kyrah Schiro 6/2/20232:02 PM

## 2021-08-10 DIAGNOSIS — E119 Type 2 diabetes mellitus without complications: Secondary | ICD-10-CM

## 2021-08-10 DIAGNOSIS — E1122 Type 2 diabetes mellitus with diabetic chronic kidney disease: Secondary | ICD-10-CM

## 2021-08-10 DIAGNOSIS — E1343 Other specified diabetes mellitus with diabetic autonomic (poly)neuropathy: Secondary | ICD-10-CM

## 2021-08-10 LAB — GLUCOSE, CAPILLARY
Glucose-Capillary: 101 mg/dL — ABNORMAL HIGH (ref 70–99)
Glucose-Capillary: 183 mg/dL — ABNORMAL HIGH (ref 70–99)

## 2021-08-10 LAB — BASIC METABOLIC PANEL
Anion gap: 12 (ref 5–15)
BUN: 90 mg/dL — ABNORMAL HIGH (ref 6–20)
CO2: 19 mmol/L — ABNORMAL LOW (ref 22–32)
Calcium: 8 mg/dL — ABNORMAL LOW (ref 8.9–10.3)
Chloride: 107 mmol/L (ref 98–111)
Creatinine, Ser: 14.56 mg/dL — ABNORMAL HIGH (ref 0.44–1.00)
GFR, Estimated: 3 mL/min — ABNORMAL LOW (ref >60)
Glucose, Bld: 89 mg/dL (ref 70–99)
Potassium: 4.1 mmol/L (ref 3.5–5.1)
Sodium: 138 mmol/L (ref 135–145)

## 2021-08-10 LAB — HAPTOGLOBIN: Haptoglobin: 87 mg/dL (ref 42–296)

## 2021-08-10 LAB — HEMOGLOBIN AND HEMATOCRIT, BLOOD
HCT: 25.3 % — ABNORMAL LOW (ref 36.0–46.0)
Hemoglobin: 8 g/dL — ABNORMAL LOW (ref 12.0–15.0)

## 2021-08-10 LAB — VITAMIN B12: Vitamin B-12: 332 pg/mL (ref 180–914)

## 2021-08-10 MED ORDER — NICOTINE 14 MG/24HR TD PT24
14.0000 mg | MEDICATED_PATCH | Freq: Every day | TRANSDERMAL | 0 refills | Status: DC
Start: 2021-08-11 — End: 2021-08-19

## 2021-08-10 MED ORDER — METOCLOPRAMIDE HCL 5 MG PO TABS: 2.5000 mg | ORAL_TABLET | Freq: Three times a day (TID) | ORAL | 0 refills | Status: DC

## 2021-08-10 MED ORDER — EPOETIN ALFA 40000 UNIT/ML IJ SOLN: 20000.0000 [IU] | INTRAMUSCULAR | 0 refills | Status: DC

## 2021-08-10 MED ORDER — CALCIUM CARBONATE ANTACID 500 MG PO CHEW
400.0000 mg | CHEWABLE_TABLET | Freq: Four times a day (QID) | ORAL | Status: DC | PRN
Start: 2021-08-10 — End: 2021-08-10
  Administered 2021-08-10: 400 mg via ORAL
  Filled 2021-08-10: qty 2

## 2021-08-10 MED ORDER — GENTAMICIN SULFATE 0.1 % EX CREA
1.0000 | TOPICAL_CREAM | Freq: Every day | CUTANEOUS | 0 refills | Status: DC
Start: 2021-08-10 — End: 2021-10-03

## 2021-08-10 MED ORDER — METOCLOPRAMIDE HCL 5 MG PO TABS
2.5000 mg | ORAL_TABLET | Freq: Three times a day (TID) | ORAL | Status: DC
  Administered 2021-08-10: 2.5 mg via ORAL
  Filled 2021-08-10 (×2): qty 0.5

## 2021-08-10 NOTE — Discharge Summary (Signed)
Physician Discharge Summary   Patient: Nichole Wiley MRN: [id number] DOB: 1980-11-02  Admit date:     08/08/2021  Discharge date: 08/10/21  Discharge Physician: Karie Kirks   PCP: Jearld Fenton, NP   Recommendations at discharge:    Discharge to home. Follow up with PCP in 7-10 days. CBC to be drawn on that visit and reported to PCP. Continue with PC. Epogen as per nephrology.  Discharge Diagnoses: Principal Problem:   Symptomatic anemia Active Problems:   ESRD (end stage renal disease) (HCC)   Type 1 diabetes mellitus without complication (HCC)   GERD (gastroesophageal reflux disease)   HTN (hypertension)   Nicotine dependence   Hypothyroidism   Depression   Gastroparesis due to secondary diabetes ([place])  Resolved Problems:   * No resolved hospital problems. [facility] Course: No notes on file  Assessment and Plan: * Symptomatic anemia Patient presents for evaluation of chest pain and shortness of breath mostly at rest Noted to have a drop in her hemoglobin to 6.9g/d Aspirin was held. She was transfused with 1 unit of packed RBC on 08/08/2021. Hemoglobin post transfusion was 7.2, but has stabilized at 8.0 over the last 24 hours. LDH, haptoglobin and LFT's do no indicate intravascular hemolysis. Anemia panel was performed. The patient has no iron deficiency. It does appear that her anemia is due to chronic inflammation and ESRD. FOBT was ordered, but not performed. However, the patient does not relate any melena or hematochezia, and the lack of iron deficiency does not support occult bleed. She was given Epogen by nephrology.   The patient refused PD last night. Nephrology aware.  She is feeling better and will be discharged to home today. Further Epogen infusions are as per nephrology.  ESRD (end stage renal disease) ([place]) Patient has a history of end-stage renal disease and is on peritoneal dialysis Nephrology consult for renal replacement via peritoneal diaysis  during this hospitalization. The patient refused PD yesterday. Nephrology aware.  Type 1 diabetes mellitus without complication (HCC) With complications of end-stage renal disease on hemodialysis Maintain consistent carbohydrate diet Glycemic control with sliding scale insulin  GERD (gastroesophageal reflux disease) Stable Continue Protonix  Gastroparesis due to secondary diabetes ([place]) The patient has complained of vomiting 3-4 times a day. She is not experiencing weight loss, however. I strongly suspect gastroparesis in this patient. Will start her on a trial of reglan 2.5 mg qac and hs to see if this helps. If not, she may benefit from referral to GI as outpatient.  Depression Stable Continue Zoloft  Hypothyroidism Stable Continue Synthroid  Nicotine dependence Smoking cessation was discussed with patient in detail We will place patient on a nicotine transdermal patch. The patient is strongly advised not to resume tobacco use upon discharge to home.  HTN (hypertension) Continue labetalol for blood pressure control   I have seen and examined this patient myself. I have spent 38 minutes in her evaluation and care.  Consultants: Nephrology Procedures performed: Transfusion of one unit PRBC's. Peritoneal dialysis.  Disposition: Home Diet recommendation:  Discharge Diet Orders (From admission, onward)     Start     Ordered   08/10/21 0000  Diet - low sodium heart healthy        08/10/21 1047   08/10/21 0000  Diet Carb Modified        08/10/21 1047           Cardiac and Carb modified diet DISCHARGE MEDICATION: Allergies as of 08/10/2021  Reactions   Cephalexin Other (See Comments), Rash   Unknown   Penicillins Rash   Has patient had a PCN reaction causing immediate rash, facial/tongue/throat swelling, SOB or lightheadedness with hypotension: No Has patient had a PCN reaction causing severe rash involving mucus membranes or skin necrosis: No Has patient had  a PCN reaction that required hospitalization: No Has patient had a PCN reaction occurring within the last 10 years: No If all of the above answers are "NO", then may proceed with Cephalosporin use.        Medication List     STOP taking these medications    ferrous sulfate 325 (65 FE) MG tablet       TAKE these medications    Accu-Chek Guide test strip Generic drug: glucose blood 1 each by Other route See admin instructions.   Accu-Chek Softclix Lancets lancets Use to check blood sugar daily for type 2 diabetes. E11.9   aspirin EC 81 MG tablet Take 81 mg by mouth daily.   epoetin alfa 40000 UNIT/ML injection Commonly known as: EPOGEN Inject 0.5 mLs (20,000 Units total) into the skin once a week. Start taking on: August 16, 2021   gentamicin cream 0.1 % Commonly known as: GARAMYCIN Apply 1 application. topically daily.   labetalol 100 MG tablet Commonly known as: NORMODYNE Take 1 tablet (100 mg total) by mouth 2 (two) times daily.   levothyroxine 25 MCG tablet Commonly known as: SYNTHROID Take 1 tablet (25 mcg total) by mouth daily.   methadone 10 MG/ML solution Commonly known as: DOLOPHINE Take 160 mg by mouth daily.   metoCLOPramide 5 MG tablet Commonly known as: REGLAN Take 0.5 tablets (2.5 mg total) by mouth 4 (four) times daily -  before meals and at bedtime.   naloxone 4 MG/0.1ML Liqd nasal spray kit Commonly known as: NARCAN One spray in either nostril once for known/suspected opioid overdose. May repeat every 2-3 minutes in alternating nostril til EMS arrives   nicotine 14 mg/24hr patch Commonly known as: NICODERM CQ - dosed in mg/24 hours Place 1 patch (14 mg total) onto the skin daily. Start taking on: August 11, 2021   omeprazole 20 MG capsule Commonly known as: PRILOSEC Take 1 capsule (20 mg total) by mouth daily.   sertraline 25 MG tablet Commonly known as: ZOLOFT Take 1 tablet (25 mg total) by mouth daily.        Discharge Exam: Filed  Weights   08/08/21 1504  Weight: 81.6 kg   Vitals:   08/10/21 0452 08/10/21 0800  BP: (!) 155/72 (!) 163/76  Pulse: 81 83  Resp: 17 17  Temp: 97.9 F (36.6 C) 98.9 F (37.2 C)  SpO2: 99% 100%   Exam:  Constitutional:  The patient is awake, alert, and oriented x 3. No acute distress. Respiratory:  No increased work of breathing. No wheezes, rales, or rhonchi No tactile fremitus Cardiovascular:  Regular rate and rhythm No murmurs, ectopy, or gallups. No lateral PMI. No thrills. Abdomen:  Abdomen is soft, non-tender, non-distended No hernias, masses, or organomegaly Normoactive bowel sounds.  Musculoskeletal:  No cyanosis, clubbing, or edema Skin:  No rashes, lesions, ulcers palpation of skin: no induration or nodules Neurologic:  CN 2-12 intact Sensation all 4 extremities intact Psychiatric:  Mental status Mood, affect appropriate Orientation to person, place, time  judgment and insight appear intact   Condition at discharge: fair  The results of significant diagnostics from this hospitalization (including imaging, microbiology, ancillary and laboratory) are listed  below for reference.   Imaging Studies: CT ABDOMEN PELVIS WO CONTRAST  Result Date: 08/08/2021 CLINICAL DATA:  Nausea and vomiting, anemia EXAM: CT ABDOMEN AND PELVIS WITHOUT CONTRAST TECHNIQUE: Multidetector CT imaging of the abdomen and pelvis was performed following the standard protocol without IV contrast. Unenhanced CT was performed per clinician order. Lack of IV contrast limits sensitivity and specificity, especially for evaluation of abdominal/pelvic solid viscera. RADIATION DOSE REDUCTION: This exam was performed according to the departmental dose-optimization program which includes automated exposure control, adjustment of the mA and/or kV according to patient size and/or use of iterative reconstruction technique. COMPARISON:  06/06/2017 FINDINGS: Lower chest: No acute pleural or parenchymal lung  disease. Hepatobiliary: Small gallstones within the gallbladder. No evidence of acute cholecystitis. Unremarkable unenhanced appearance of the liver. Pancreas: Unremarkable unenhanced appearance. Spleen: Unremarkable unenhanced appearance. Adrenals/Urinary Tract: No urinary tract calculi or obstructive uropathy within either kidney. The adrenals and bladder are unremarkable. Stomach/Bowel: No bowel obstruction or ileus. Normal retrocecal appendix. No bowel wall thickening or inflammatory change. Vascular/Lymphatic: Aortic atherosclerosis. No enlarged abdominal or pelvic lymph nodes. Reproductive: Status post hysterectomy. No adnexal masses. Other: Trace pelvic free fluid. No free intraperitoneal gas. Peritoneal dialysis catheter coiled within the lower pelvis. No abdominal wall hernia. Musculoskeletal: No acute or destructive bony lesions. Reconstructed images demonstrate no additional findings. IMPRESSION: 1. Cholelithiasis without acute cholecystitis. 2. No bowel obstruction or ileus. 3.  Aortic Atherosclerosis (ICD10-I70.0). Electronically Signed   By: Randa Ngo M.D.   On: 08/08/2021 18:15    Microbiology: Results for orders placed or performed in visit on 06/25/17  Microscopic Examination     Status: Abnormal   Collection Time: 06/25/17 10:15 AM   URINE  Result Value Ref Range Status   WBC, UA 11-30 (A) 0 - 5 /hpf Final   RBC, UA 3-10 (A) 0 - 2 /hpf Final   Epithelial Cells (non renal) 0-10 0 - 10 /hpf Final   Casts Present (A) None seen /lpf Final   Cast Type Hyaline casts N/A Final   Mucus, UA Present (A) Not Estab. Final   Bacteria, UA Moderate (A) None seen/Few Final  CULTURE, URINE COMPREHENSIVE     Status: None   Collection Time: 06/25/17 12:16 PM   Specimen: Urine   BL  Result Value Ref Range Status   Urine Culture, Comprehensive Final report  Final   Organism ID, Bacteria Comment  Final    Comment: Mixed urogenital flora Greater than 100,000 colony forming units per mL      Labs: CBC: Recent Labs  Lab 08/07/21 1110 08/08/21 1509 08/09/21 0423 08/09/21 1547 08/09/21 2308 08/10/21 0522  WBC 9.7 8.9 7.4  --   --   --   HGB 7.7* 6.9* 7.2* 7.8* 9.1* 8.0*  HCT 24.1* 22.2* 22.9* 24.8* 29.3* 25.3*  MCV 93.8 93.3 89.8  --   --   --   PLT 150 130* 107*  --   --   --    Basic Metabolic Panel: Recent Labs  Lab 08/07/21 1110 08/08/21 1509 08/09/21 0423 08/10/21 0522  NA 138 136 138 138  K 4.0 3.6 3.8 4.1  CL 105 108 109 107  CO2 21 21* 21* 19*  GLUCOSE 199* 203* 117* 89  BUN 87* 89* 93* 90*  CREATININE 14.15* 14.24* 14.08* 14.56*  CALCIUM 7.9* 7.7* 7.6* 8.0*   Liver Function Tests: Recent Labs  Lab 08/07/21 1110 08/09/21 0936  AST 10 13*  ALT 14 15  ALKPHOS  --  61  BILITOT 0.2 0.3  PROT 4.8* 5.5*  ALBUMIN  --  2.6*   CBG: Recent Labs  Lab 08/09/21 0805 08/09/21 0831 08/09/21 1138 08/09/21 2242 08/10/21 0810  GLUCAP 100* 103* 108* 135* 101*    Discharge time spent: greater than 30 minutes.  Signed: Surina Storts, DO [facility] 08/10/2021

## 2021-08-10 NOTE — Assessment & Plan Note (Signed)
The patient has complained of vomiting 3-4 times a day. She is not experiencing weight loss, however. I strongly suspect gastroparesis in this patient. Will start her on a trial of reglan 2.5 mg qac and hs to see if this helps. If not, she may benefit from referral to GI as outpatient.

## 2021-08-10 NOTE — Plan of Care (Signed)
Patient discharged per MD orders.All discharge instructions,education and medications reviewed with the patient.Pt expressed understanding and will comply with dc instructions follow up appointments was also communicated to the patient.no verbal c/o or any ssx of distress.patient was discharged home with self-care per order.Pt was transported home by husband in a privately owned vehicle.

## 2021-08-12 LAB — GLUCOSE, CAPILLARY: Glucose-Capillary: 92 mg/dL (ref 70–99)

## 2021-08-13 ENCOUNTER — Telehealth: Payer: Self-pay | Admitting: Family Medicine

## 2021-08-13 LAB — COMPLETE METABOLIC PANEL WITH GFR
AG Ratio: 1.3 (calc) (ref 1.0–2.5)
ALT: 14 U/L (ref 6–29)
AST: 10 U/L (ref 10–30)
Albumin: 2.7 g/dL — ABNORMAL LOW (ref 3.6–5.1)
Alkaline phosphatase (APISO): 73 U/L (ref 31–125)
BUN/Creatinine Ratio: 6 (calc) (ref 6–22)
BUN: 87 mg/dL — ABNORMAL HIGH (ref 7–25)
CO2: 21 mmol/L (ref 20–32)
Calcium: 7.9 mg/dL — ABNORMAL LOW (ref 8.6–10.2)
Chloride: 105 mmol/L (ref 98–110)
Creat: 14.15 mg/dL — ABNORMAL HIGH (ref 0.50–0.99)
Globulin: 2.1 g/dL (calc) (ref 1.9–3.7)
Glucose, Bld: 199 mg/dL — ABNORMAL HIGH (ref 65–99)
Potassium: 4 mmol/L (ref 3.5–5.3)
Sodium: 138 mmol/L (ref 135–146)
Total Bilirubin: 0.2 mg/dL (ref 0.2–1.2)
Total Protein: 4.8 g/dL — ABNORMAL LOW (ref 6.1–8.1)
eGFR: 3 mL/min/{1.73_m2} — ABNORMAL LOW (ref >=60)

## 2021-08-13 LAB — T4, FREE: Free T4: 0.7 ng/dL — ABNORMAL LOW (ref 0.8–1.8)

## 2021-08-13 LAB — CBC
HCT: 24.1 % — ABNORMAL LOW (ref 35.0–45.0)
Hemoglobin: 7.7 g/dL — ABNORMAL LOW (ref 11.7–15.5)
MCH: 30 pg (ref 27.0–33.0)
MCHC: 32 g/dL (ref 32.0–36.0)
MCV: 93.8 fL (ref 80.0–100.0)
MPV: 11.6 fL (ref 7.5–12.5)
Platelets: 150 10*3/uL (ref 140–400)
RBC: 2.57 10*6/uL — ABNORMAL LOW (ref 3.80–5.10)
RDW: 13 % (ref 11.0–15.0)
WBC: 9.7 10*3/uL (ref 3.8–10.8)

## 2021-08-13 LAB — TSH: TSH: 14.53 mIU/L — ABNORMAL HIGH

## 2021-08-13 LAB — TROPONIN T: Troponin T TROPT: 1.28 ng/mL (ref <0.01)

## 2021-08-13 NOTE — Telephone Encounter (Signed)
Called with critical troponin drawn on 08/06/21. Patient already presented to ER on 08/08/21. Nothing further needed.

## 2021-08-14 ENCOUNTER — Telehealth: Payer: Self-pay

## 2021-08-14 ENCOUNTER — Ambulatory Visit: Payer: Medicaid Other | Admitting: Internal Medicine

## 2021-08-14 ENCOUNTER — Inpatient Hospital Stay: Payer: Medicaid Other | Admitting: Internal Medicine

## 2021-08-14 NOTE — Progress Notes (Deleted)
Subjective:    Patient ID: Nichole Wiley, female    DOB: [birth year], 41 y.o.   MRN: [id number]  HPI  Patient presents to clinic today for hospital follow-up.  She presented to the ER 6/1 after direction from me for critical H/H of 6.9/22.2.  She also had a troponin of 1.28, elevated TSH.  She also had elevated creatinine but she is known ESRD on peritoneal dialysis.  She was transfused 1 packed PRBC.  Her hemoglobin stabilized around 8.  She was discharged on 6/3 and advised to follow-up with her PCP.  Since discharge.  Review of Systems     Past Medical History:  Diagnosis Date   Abdominal mass    Arthritis    back   Chronic kidney disease    Colon polyp    Diabetes 1.5, managed as type 1 ([place])    Drug addiction (HCC)    hx (pain pills after c-sect)   GERD (gastroesophageal reflux disease)    Hypertension    Pelvic floor dysfunction    Urine incontinence     Current Outpatient Medications  Medication Sig Dispense Refill   Accu-Chek Softclix Lancets lancets Use to check blood sugar daily for type 2 diabetes. E11.9 100 each 4   aspirin EC 81 MG tablet Take 81 mg by mouth daily.     [START ON 08/16/2021] epoetin alfa (EPOGEN) 40000 UNIT/ML injection Inject 0.5 mLs (20,000 Units total) into the skin once a week. 1 mL 0   gentamicin cream (GARAMYCIN) 0.1 % Apply 1 application. topically daily. 15 g 0   glucose blood (ACCU-CHEK GUIDE) test strip 1 each by Other route See admin instructions. 100 each 0   labetalol (NORMODYNE) 100 MG tablet Take 1 tablet (100 mg total) by mouth 2 (two) times daily. 60 tablet 0   levothyroxine (SYNTHROID) 25 MCG tablet Take 1 tablet (25 mcg total) by mouth daily. 90 tablet 0   methadone (DOLOPHINE) 10 MG/ML solution Take 160 mg by mouth daily.     metoCLOPramide (REGLAN) 5 MG tablet Take 0.5 tablets (2.5 mg total) by mouth 4 (four) times daily -  before meals and at bedtime. 120 tablet 0   naloxone (NARCAN) nasal spray 4 mg/0.1 mL One spray in either  nostril once for known/suspected opioid overdose. May repeat every 2-3 minutes in alternating nostril til EMS arrives     nicotine (NICODERM CQ - DOSED IN MG/24 HOURS) 14 mg/24hr patch Place 1 patch (14 mg total) onto the skin daily. 28 patch 0   omeprazole (PRILOSEC) 20 MG capsule Take 1 capsule (20 mg total) by mouth daily. 90 capsule 0   sertraline (ZOLOFT) 25 MG tablet Take 1 tablet (25 mg total) by mouth daily. 90 tablet 0   No current facility-administered medications for this visit.    Allergies  Allergen Reactions   Cephalexin Other (See Comments) and Rash    Unknown   Penicillins Rash    Has patient had a PCN reaction causing immediate rash, facial/tongue/throat swelling, SOB or lightheadedness with hypotension: No Has patient had a PCN reaction causing severe rash involving mucus membranes or skin necrosis: No Has patient had a PCN reaction that required hospitalization: No Has patient had a PCN reaction occurring within the last 10 years: No If all of the above answers are "NO", then may proceed with Cephalosporin use.    Family History  Problem Relation Age of Onset   Depression Mother    Diabetes Father  Heart disease Maternal Grandmother    Depression Maternal Grandmother    Dementia Maternal Grandmother    Diabetes Maternal Grandmother    Diabetes Paternal Grandmother     Social History   Socioeconomic History   Marital status: Single    Spouse name: Not on file   Number of children: 2   Years of education: Not on file   Highest education level: Not on file  Occupational History   Not on file  Tobacco Use   Smoking status: Every Day    Packs/day: 0.50    Years: 24.00    Pack years: 12.00    Types: Cigarettes   Smokeless tobacco: Never   Tobacco comments:    has cut down from 2 PPD  Vaping Use   Vaping Use: Never used  Substance and Sexual Activity   Alcohol use: No   Drug use: No   Sexual activity: Yes  Other Topics Concern   Not on file   Social History Narrative   Not on file   Social Determinants of Health   Financial Resource Strain: Not on file  Food Insecurity: Not on file  Transportation Needs: Not on file  Physical Activity: Not on file  Stress: Not on file  Social Connections: Not on file  Intimate Partner Violence: Not on file     Constitutional: Denies fever, malaise, fatigue, headache or abrupt weight changes.  HEENT: Denies eye pain, eye redness, ear pain, ringing in the ears, wax buildup, runny nose, nasal congestion, bloody nose, or sore throat. Respiratory: Denies difficulty breathing, shortness of breath, cough or sputum production.   Cardiovascular: Denies chest pain, chest tightness, palpitations or swelling in the hands or feet.  Gastrointestinal: Denies abdominal pain, bloating, constipation, diarrhea or blood in the stool.  GU: Denies urgency, frequency, pain with urination, burning sensation, blood in urine, odor or discharge. Musculoskeletal: Denies decrease in range of motion, difficulty with gait, muscle pain or joint pain and swelling.  Skin: Denies redness, rashes, lesions or ulcercations.  Neurological: Denies dizziness, difficulty with memory, difficulty with speech or problems with balance and coordination.  Psych: Denies anxiety, depression, SI/HI.  No other specific complaints in a complete review of systems (except as listed in HPI above).  Objective:   Physical Exam   There were no vitals taken for this visit. Wt Readings from Last 3 Encounters:  08/08/21 180 lb (81.6 kg)  08/07/21 180 lb (81.6 kg)  06/27/21 182 lb (82.6 kg)    General: Appears their stated age, well developed, well nourished in NAD. Skin: Warm, dry and intact. No rashes, lesions or ulcerations noted. HEENT: Head: normal shape and size; Eyes: sclera white, no icterus, conjunctiva pink, PERRLA and EOMs intact; Ears: Tm's gray and intact, normal light reflex; Nose: mucosa pink and moist, septum midline;  Throat/Mouth: Teeth present, mucosa pink and moist, no exudate, lesions or ulcerations noted.  Neck:  Neck supple, trachea midline. No masses, lumps or thyromegaly present.  Cardiovascular: Normal rate and rhythm. S1,S2 noted.  No murmur, rubs or gallops noted. No JVD or BLE edema. No carotid bruits noted. Pulmonary/Chest: Normal effort and positive vesicular breath sounds. No respiratory distress. No wheezes, rales or ronchi noted.  Abdomen: Soft and nontender. Normal bowel sounds. No distention or masses noted. Liver, spleen and kidneys non palpable. Musculoskeletal: Normal range of motion. No signs of joint swelling. No difficulty with gait.  Neurological: Alert and oriented. Cranial nerves II-XII grossly intact. Coordination normal.  Psychiatric: Mood and affect  normal. Behavior is normal. Judgment and thought content normal.   BMET    Component Value Date/Time   NA 138 08/10/2021 0522   NA 127 (L) 01/20/2012 1610   K 4.1 08/10/2021 0522   K 3.5 01/20/2012 1610   CL 107 08/10/2021 0522   CL 91 (L) 01/20/2012 1610   CO2 19 (L) 08/10/2021 0522   CO2 26 01/20/2012 1610   GLUCOSE 89 08/10/2021 0522   GLUCOSE 560 (HH) 01/20/2012 1610   BUN 90 (H) 08/10/2021 0522   BUN 10 01/20/2012 1610   CREATININE 14.56 (H) 08/10/2021 0522   CREATININE 14.15 (H) 08/07/2021 1110   CALCIUM 8.0 (L) 08/10/2021 0522   CALCIUM 8.7 01/20/2012 1610   GFRNONAA 3 (L) 08/10/2021 0522   GFRNONAA 71 09/28/2015 1524   GFRAA 35 (L) 11/23/2018 1719   GFRAA 82 09/28/2015 1524    Lipid Panel     Component Value Date/Time   CHOL 258 (H) 11/23/2018 1719   TRIG 113 11/23/2018 1719   HDL 55 11/23/2018 1719   CHOLHDL 4.7 11/23/2018 1719   VLDL 23 11/23/2018 1719   LDLCALC 180 (H) 11/23/2018 1719    CBC    Component Value Date/Time   WBC 7.4 08/09/2021 0423   RBC 2.73 (L) 08/09/2021 1547   RBC 2.55 (L) 08/09/2021 0423   HGB 8.0 (L) 08/10/2021 0522   HGB 12.8 01/20/2012 1610   HCT 25.3 (L) 08/10/2021  0522   HCT 36.8 01/20/2012 1610   PLT 107 (L) 08/09/2021 0423   PLT 95 (L) 01/20/2012 1610   MCV 89.8 08/09/2021 0423   MCV 89 01/20/2012 1610   MCH 28.2 08/09/2021 0423   MCHC 31.4 08/09/2021 0423   RDW 15.7 (H) 08/09/2021 0423   RDW 12.6 01/20/2012 1610    Hgb A1C Lab Results  Component Value Date   HGBA1C 5 04/27/2021           Assessment & Plan:   Hospital follow-up for Symptomatic Anemia:  Hospital notes, labs and imaging reviewed   Return precautions discussed Webb Silversmith, NP

## 2021-08-14 NOTE — Telephone Encounter (Signed)
Transition Care Management Follow-up Telephone Call Date of discharge and from where: 08/10/21 / [facility] How have you been since you were released from the hospital? " Doing fine" Any questions or concerns? No  Items Reviewed: Did the pt receive and understand the discharge instructions provided? Yes  Medications obtained and verified? No , patient states she was unable to pick up medications because she did not have enough money this month. She states she would have normally had the co-pay because she has Medicaid. Patient states she just moved to her own place and that is why she was a little low on money. Other?  N/a Any new allergies since your discharge? No  Dietary orders reviewed? Yes Do you have support at home? Yes   Home Care and Equipment/Supplies: Were home health services ordered? no If so, what is the name of the agency?   Has the agency set up a time to come to the patient's home? not applicable Were any new equipment or medical supplies ordered?  No What is the name of the medical supply agency?  Were you able to get the supplies/equipment? not applicable Do you have any questions related to the use of the equipment or supplies? N/a  Functional Questionnaire: (I = Independent and D = Dependent) ADLs: I  Bathing/Dressing- I  Meal Prep- I  Eating- I  Maintaining continence- I  Transferring/Ambulation- II  Managing Meds- I  Follow up appointments reviewed:  PCP Hospital f/u appt confirmed? Yes  Scheduled to see Webb Silversmith on 08/19/21 @ 3:00 pm. [facility] f/u appt confirmed? No  Scheduled to see  Are transportation arrangements needed? Yes  If their condition worsens, is the pt aware to call PCP or go to the Emergency Dept.? Yes Was the patient provided with contact information for the PCP's office or ED? Yes Was to pt encouraged to call back with questions or concerns? Yes  Quinn Plowman RN,BSN,CCM RN Case Manager [phone]

## 2021-08-14 NOTE — Telephone Encounter (Signed)
Transition Care Management Unsuccessful Follow-up Telephone Call  Date of discharge and from where:  08/10/21 / [facility]  Attempts:  1st Attempt  Reason for unsuccessful TCM follow-up call:  Unable to leave message due to voice mail not being set up.   Quinn Plowman RN,BSN,CCM RN Case Manager [phone]

## 2021-08-14 NOTE — Telephone Encounter (Signed)
noted 

## 2021-08-19 ENCOUNTER — Encounter: Payer: Self-pay | Admitting: Internal Medicine

## 2021-08-19 ENCOUNTER — Ambulatory Visit: Payer: Medicaid Other | Admitting: Internal Medicine

## 2021-08-19 VITALS — BP 165/78 | HR 76 | Temp 98.2°F | Wt 182.0 lb

## 2021-08-19 DIAGNOSIS — D649 Anemia, unspecified: Secondary | ICD-10-CM | POA: Diagnosis not present

## 2021-08-19 DIAGNOSIS — E1143 Type 2 diabetes mellitus with diabetic autonomic (poly)neuropathy: Secondary | ICD-10-CM

## 2021-08-19 DIAGNOSIS — F418 Other specified anxiety disorders: Secondary | ICD-10-CM

## 2021-08-19 DIAGNOSIS — N2889 Other specified disorders of kidney and ureter: Secondary | ICD-10-CM

## 2021-08-19 DIAGNOSIS — R7989 Other specified abnormal findings of blood chemistry: Secondary | ICD-10-CM

## 2021-08-19 DIAGNOSIS — F330 Major depressive disorder, recurrent, mild: Secondary | ICD-10-CM | POA: Diagnosis not present

## 2021-08-19 DIAGNOSIS — R002 Palpitations: Secondary | ICD-10-CM

## 2021-08-19 DIAGNOSIS — R0602 Shortness of breath: Secondary | ICD-10-CM

## 2021-08-19 DIAGNOSIS — R0789 Other chest pain: Secondary | ICD-10-CM

## 2021-08-19 DIAGNOSIS — I151 Hypertension secondary to other renal disorders: Secondary | ICD-10-CM

## 2021-08-19 DIAGNOSIS — N186 End stage renal disease: Secondary | ICD-10-CM | POA: Diagnosis not present

## 2021-08-19 DIAGNOSIS — I150 Renovascular hypertension: Secondary | ICD-10-CM

## 2021-08-19 DIAGNOSIS — K3184 Gastroparesis: Secondary | ICD-10-CM

## 2021-08-19 DIAGNOSIS — F410 Panic disorder [episodic paroxysmal anxiety] without agoraphobia: Secondary | ICD-10-CM

## 2021-08-19 DIAGNOSIS — K219 Gastro-esophageal reflux disease without esophagitis: Secondary | ICD-10-CM

## 2021-08-19 MED ORDER — LABETALOL HCL 100 MG PO TABS: 100.0000 mg | ORAL_TABLET | Freq: Three times a day (TID) | ORAL | 1 refills | Status: DC
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
## 2021-08-19 NOTE — Patient Instructions (Signed)
Anemia  Anemia is a condition in which there is not enough red blood cells or hemoglobin in the blood. Hemoglobin is a substance in red blood cells that carries oxygen. When you do not have enough red blood cells or hemoglobin (are anemic), your body cannot get enough oxygen and your organs may not work properly. As a result, you may feel very tired or have other problems. What are the causes? Common causes of anemia include: Excessive bleeding. Anemia can be caused by excessive bleeding inside or outside the body, including bleeding from the intestines or from heavy menstrual periods in females. Poor nutrition. Long-lasting (chronic) kidney, thyroid, and liver disease. Bone marrow disorders, spleen problems, and blood disorders. Cancer and treatments for cancer. HIV (human immunodeficiency virus) and AIDS (acquired immunodeficiency syndrome). Infections, medicines, and autoimmune disorders that destroy red blood cells. What are the signs or symptoms? Symptoms of this condition include: Minor weakness. Dizziness. Headache, or difficulties concentrating and sleeping. Heartbeats that feel irregular or faster than normal (palpitations). Shortness of breath, especially with exercise. Pale skin, lips, and nails, or cold hands and feet. Indigestion and nausea. Symptoms may occur suddenly or develop slowly. If your anemia is mild, you may not have symptoms. How is this diagnosed? This condition is diagnosed based on blood tests, your medical history, and a physical exam. In some cases, a test may be needed in which cells are removed from the soft tissue inside of a bone and looked at under a microscope (bone marrow biopsy). Your health care provider may also check your stool (feces) for blood and may do additional testing to look for the cause of your bleeding. Other tests may include: Imaging tests, such as a CT scan or MRI. A procedure to see inside your esophagus and stomach (endoscopy). A  procedure to see inside your colon and rectum (colonoscopy). How is this treated? Treatment for this condition depends on the cause. If you continue to lose a lot of blood, you may need to be treated at a hospital. Treatment may include: Taking supplements of iron, vitamin B12, or folic acid. Taking a hormone medicine (erythropoietin) that can help to stimulate red blood cell growth. Having a blood transfusion. This may be needed if you lose a lot of blood. Making changes to your diet. Having surgery to remove your spleen. Follow these instructions at home: Take over-the-counter and prescription medicines only as told by your health care provider. Take supplements only as told by your health care provider. Follow any diet instructions that you were given by your health care provider. Keep all follow-up visits as told by your health care provider. This is important. Contact a health care provider if: You develop new bleeding anywhere in the body. Get help right away if: You are very weak. You are short of breath. You have pain in your abdomen or chest. You are dizzy or feel faint. You have trouble concentrating. You have bloody stools, black stools, or tarry stools. You vomit repeatedly or you vomit up blood. These symptoms may represent a serious problem that is an emergency. Do not wait to see if the symptoms will go away. Get medical help right away. Call your local emergency services (911 in the [place] Do not drive yourself to the hospital. Summary Anemia is a condition in which you do not have enough red blood cells or enough of a substance in your red blood cells that carries oxygen (hemoglobin). Symptoms may occur suddenly or develop slowly. If your anemia   is mild, you may not have symptoms. This condition is diagnosed with blood tests, a medical history, and a physical exam. Other tests may be needed. Treatment for this condition depends on the cause of the anemia. This  information is not intended to replace advice given to you by your health care provider. Make sure you discuss any questions you have with your health care provider. Document Revised: 01/08/2021 Document Reviewed: 02/01/2019 Elsevier Patient Education  2023 [employer]  

## 2021-08-20 LAB — CBC
HCT: 26.8 % — ABNORMAL LOW (ref 35.0–45.0)
Hemoglobin: 8.4 g/dL — ABNORMAL LOW (ref 11.7–15.5)
MCH: 29.1 pg (ref 27.0–33.0)
MCHC: 31.3 g/dL — ABNORMAL LOW (ref 32.0–36.0)
MCV: 92.7 fL (ref 80.0–100.0)
MPV: 11.2 fL (ref 7.5–12.5)
Platelets: 170 10*3/uL (ref 140–400)
RBC: 2.89 10*6/uL — ABNORMAL LOW (ref 3.80–5.10)
RDW: 15 % (ref 11.0–15.0)
WBC: 7.4 10*3/uL (ref 3.8–10.8)

## 2021-08-20 LAB — COMPLETE METABOLIC PANEL WITH GFR
AG Ratio: 1.3 (calc) (ref 1.0–2.5)
ALT: 9 U/L (ref 6–29)
AST: 7 U/L — ABNORMAL LOW (ref 10–30)
Albumin: 2.9 g/dL — ABNORMAL LOW (ref 3.6–5.1)
Alkaline phosphatase (APISO): 71 U/L (ref 31–125)
BUN/Creatinine Ratio: 6 (calc) (ref 6–22)
BUN: 88 mg/dL — ABNORMAL HIGH (ref 7–25)
CO2: 19 mmol/L — ABNORMAL LOW (ref 20–32)
Calcium: 8.1 mg/dL — ABNORMAL LOW (ref 8.6–10.2)
Chloride: 105 mmol/L (ref 98–110)
Creat: 15.72 mg/dL — ABNORMAL HIGH (ref 0.50–0.99)
Globulin: 2.2 g/dL (calc) (ref 1.9–3.7)
Glucose, Bld: 82 mg/dL (ref 65–99)
Potassium: 4.8 mmol/L (ref 3.5–5.3)
Sodium: 138 mmol/L (ref 135–146)
Total Bilirubin: 0.2 mg/dL (ref 0.2–1.2)
Total Protein: 5.1 g/dL — ABNORMAL LOW (ref 6.1–8.1)
eGFR: 3 mL/min/{1.73_m2} — ABNORMAL LOW (ref >=60)

## 2021-09-04 IMAGING — US US BIOPSY
1 series · 7 of 7 positions shown · non-contrast
Comparison: CT abdomen and pelvis - 06/06/2017

INDICATION: Proteinuria of uncertain etiology. Please perform random renal
biopsy for tissue diagnostic purposes.

EXAM:
ULTRASOUND GUIDED RENAL BIOPSY

[Series 1: us biopsy · 0.23mm/px · 7 of 7 slices shown]
[im 1/7]
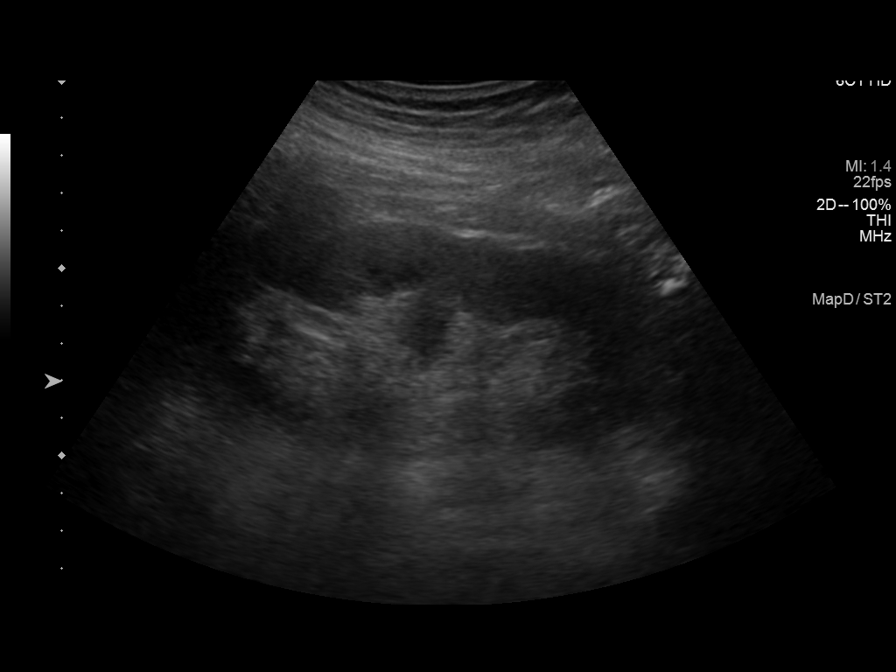
[im 2/7]
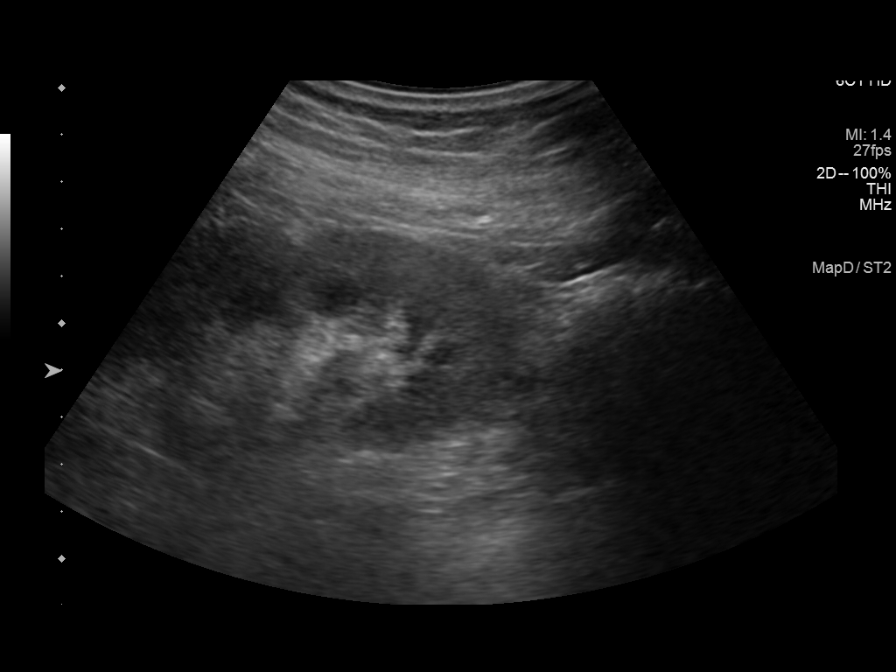
[im 3/7]
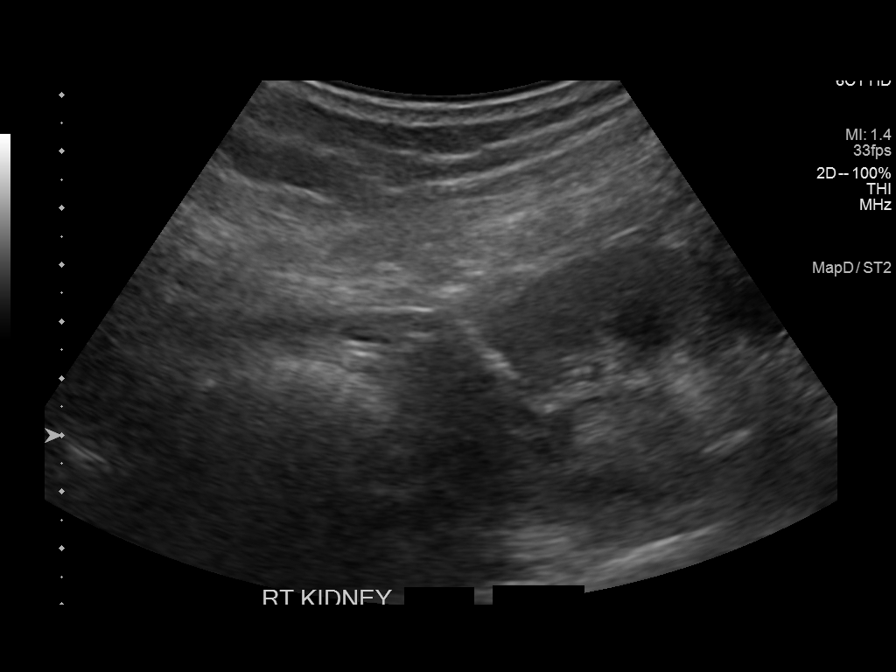
[im 4/7]
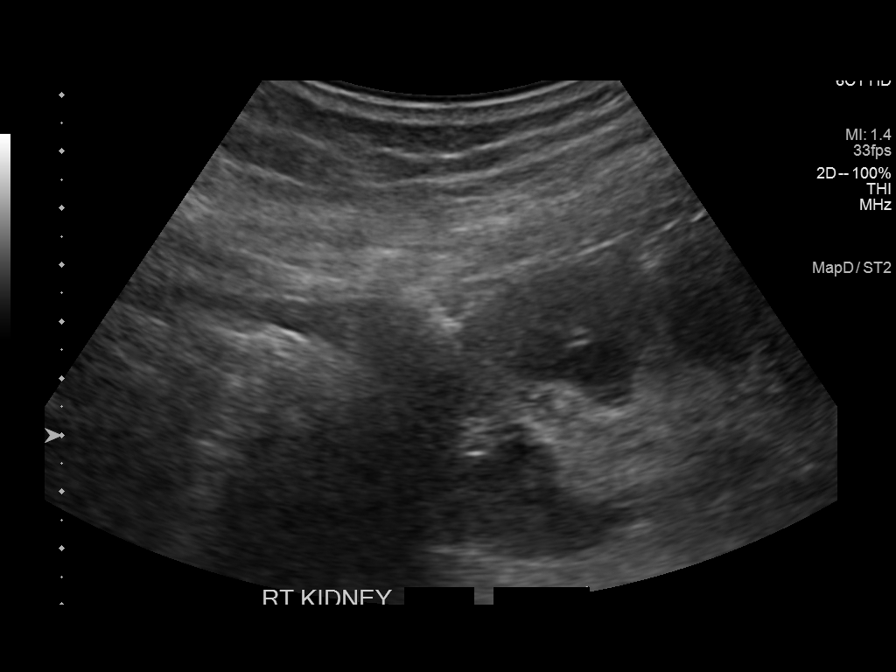
[im 5/7]
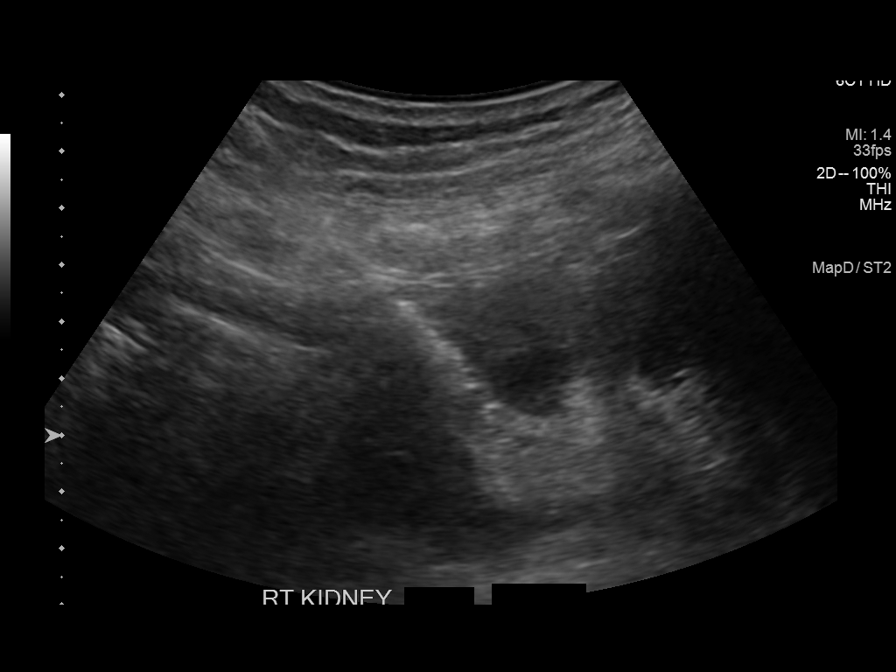
[im 6/7]
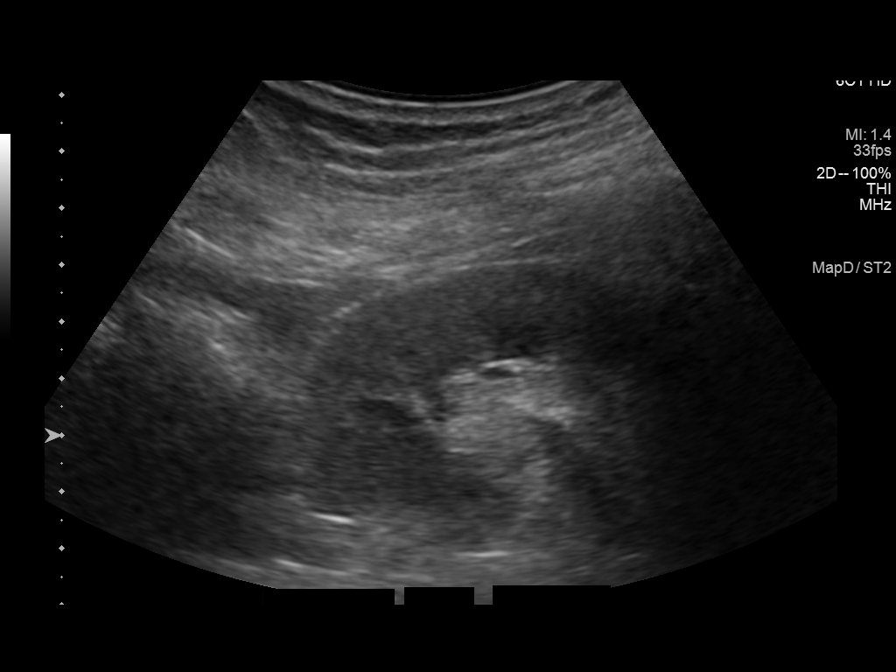
[im 7/7]
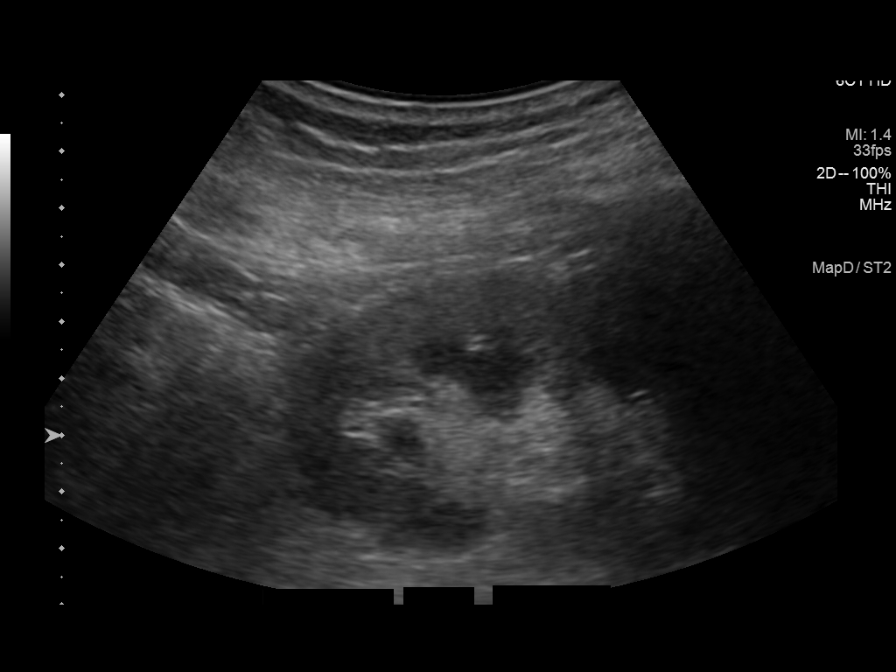

[7 of 7 positions shown; findings below may reference images not displayed]

MEDICATIONS:
None.

ANESTHESIA/SEDATION:
Fentanyl 100 mcg IV; Versed 4 mg IV

Total Moderate Sedation time: 24 minutes; The patient was
continuously monitored during the procedure by the interventional
radiology nurse under my direct supervision.

COMPLICATIONS:
None immediate.

PROCEDURE:
Informed written consent was obtained from the patient after a
discussion of the risks, benefits and alternatives to treatment. The
patient understands and consents the procedure. A timeout was
performed prior to the initiation of the procedure.

Ultrasound scanning was performed of the bilateral flanks. The
inferior pole of the right kidney was selected for biopsy due to
location and sonographic window. The procedure was planned. The
operative site was prepped and draped in the usual sterile fashion.
The overlying soft tissues were anesthetized with 1% lidocaine with
epinephrine. A 17 gauge core needle biopsy device was advanced into
the inferior cortex of the right kidney and 3 core biopsies were
obtained under direct ultrasound guidance. Images were saved for
documentation purposes. The biopsy device was removed and hemostasis
was obtained with manual compression. Post procedural scanning was
negative for significant post procedural hemorrhage or additional
complication. A dressing was placed. The patient tolerated the
procedure well without immediate post procedural complication.
IMPRESSION: Technically successful ultrasound guided right renal biopsy.

## 2021-09-20 ENCOUNTER — Encounter: Payer: Self-pay | Admitting: Physician Assistant

## 2021-09-20 ENCOUNTER — Ambulatory Visit
Admission: RE | Admit: 2021-09-20 | Discharge: 2021-09-20 | Disposition: A | Discharge status: Home / Self Care | Payer: Medicaid Other | Source: Ambulatory Visit | Attending: Physician Assistant | Admitting: Physician Assistant

## 2021-09-20 ENCOUNTER — Ambulatory Visit: Payer: Self-pay

## 2021-09-20 ENCOUNTER — Ambulatory Visit (INDEPENDENT_AMBULATORY_CARE_PROVIDER_SITE_OTHER): Payer: Medicaid Other | Admitting: Physician Assistant

## 2021-09-20 VITALS — BP 160/73 | HR 77 | Temp 96.9°F | Wt 187.0 lb

## 2021-09-20 DIAGNOSIS — R0602 Shortness of breath: Secondary | ICD-10-CM | POA: Diagnosis not present

## 2021-09-20 DIAGNOSIS — J189 Pneumonia, unspecified organism: Secondary | ICD-10-CM

## 2021-09-20 DIAGNOSIS — F418 Other specified anxiety disorders: Secondary | ICD-10-CM

## 2021-09-20 DIAGNOSIS — E109 Type 1 diabetes mellitus without complications: Secondary | ICD-10-CM | POA: Diagnosis not present

## 2021-09-20 NOTE — Assessment & Plan Note (Signed)
Chronic, historic condition Does not appear to be on medication and denies current insulin use Reports her blood glucose has been dropping with some results in the 40s  States she is not eating but one time per day  Encouraged her to eat regularly scheduled meals along with snacks - protein with complex carbs  Provided guides on hypoglycemia for her so prevent emergencies  Recommend she follow up in 4 weeks with PCP to discuss management  Seek ED management for severe occurrences

## 2021-09-20 NOTE — Telephone Encounter (Signed)
    Chief Complaint: Feels SOB, difficulty taking a deep breath. Comes and goes. Symptoms: Above Frequency: 1 week Pertinent Negatives: Patient denies chest pain Disposition: '[]'$ ED /'[]'$ Urgent Care (no appt availability in office) / '[x]'$ Appointment(In office/virtual)/ '[]'$  [facility]/ '[]'$ Home Care/ '[]'$ Refused Recommended Disposition /'[]'$ [facility]/ '[]'$  Follow-up with PCP Additional Notes: Instructed to go to ED for worsening of symptoms.  Reason for Disposition  [1] MILD difficulty breathing (e.g., minimal/no SOB at rest, SOB with walking, pulse <100) AND [2] NEW-onset or WORSE than normal  Answer Assessment - Initial Assessment Questions 1. RESPIRATORY STATUS: "Describe your breathing?" (e.g., wheezing, shortness of breath, unable to speak, severe coughing)      Can't take a deep breath 2. ONSET: "When did this breathing problem begin?"      1 week ago 3. PATTERN "Does the difficult breathing come and go, or has it been constant since it started?"      Comes and goes 4. SEVERITY: "How bad is your breathing?" (e.g., mild, moderate, severe)    - MILD: No SOB at rest, mild SOB with walking, speaks normally in sentences, can lie down, no retractions, pulse < 100.    - MODERATE: SOB at rest, SOB with minimal exertion and prefers to sit, cannot lie down flat, speaks in phrases, mild retractions, audible wheezing, pulse 100-120.    - SEVERE: Very SOB at rest, speaks in single words, struggling to breathe, sitting hunched forward, retractions, pulse > 120      Moderate 5. RECURRENT SYMPTOM: "Have you had difficulty breathing before?" If Yes, ask: "When was the last time?" and "What happened that time?"      No 6. CARDIAC HISTORY: "Do you have any history of heart disease?" (e.g., heart attack, angina, bypass surgery, angioplasty)      No 7. LUNG HISTORY: "Do you have any history of lung disease?"  (e.g., pulmonary embolus, asthma, emphysema)     No 8. CAUSE: "What do you think  is causing the breathing problem?"      Unsure 9. OTHER SYMPTOMS: "Do you have any other symptoms? (e.g., dizziness, runny nose, cough, chest pain, fever)     Mild cough 10. O2 SATURATION MONITOR:  "Do you use an oxygen saturation monitor (pulse oximeter) at home?" If Yes, "What is your reading (oxygen level) today?" "What is your usual oxygen saturation reading?" (e.g., 95%)       No 11. PREGNANCY: "Is there any chance you are pregnant?" "When was your last menstrual period?"       No 12. TRAVEL: "Have you traveled out of the country in the last month?" (e.g., travel history, exposures)       NO  Protocols used: Breathing Difficulty-A-AH

## 2021-09-20 NOTE — Progress Notes (Signed)
Acute Office Visit   Patient: Nichole Wiley   DOB: 09-06-1980   41 y.o. Female  MRN: [id number] Visit Date: 09/20/2021  Today's healthcare provider: Dani Gobble Lashan Gluth, PA-C  Introduced myself to the patient as a Journalist, newspaper and provided education on APPs in clinical practice.    CC: SOB, worse with laying down, muscle aches and cramps  Subjective    HPI    States she is having trouble breathing and has been having difficulty all week Seemed to come and go but now seems like it is lasting longer Reports she has a hx of panic attacks but this feels different  Last dialysis treatment was Monday - stayed for entire treatment States she does it at home  Reports she does not have to do it all the time - states she still urinates Has plans to do a treatment after this apt  Aggravating: laying down and flat on her back seems to make breathing more difficult Alleviating: Nothing   States she can't walk due to pain in her legs- limited to about 50 feet  States she develops muscular pain  Does not seem to have aggravating factors  Denies recent injuries, bleeding or blood in stool States she has been nauseous and vomiting for the past 2 months and thinks this preceded the muscle pain She denies changes to urine appearance  States she has been having repeated low blood sugars (some have been <40)  Reports she only eats once per day and is having difficulty with eating regularly due to nausea and stomach distress   Medications: Outpatient Medications Prior to Visit  Medication Sig   aspirin EC 81 MG tablet Take 81 mg by mouth daily.   epoetin alfa (EPOGEN) 40000 UNIT/ML injection Inject 0.5 mLs (20,000 Units total) into the skin once a week.   gentamicin cream (GARAMYCIN) 0.1 % Apply 1 application. topically daily.   glucose blood (ACCU-CHEK GUIDE) test strip 1 each by Other route See admin instructions.   labetalol (NORMODYNE) 100 MG tablet Take 1 tablet (100 mg total) by mouth 3  (three) times daily.   levothyroxine (SYNTHROID) 25 MCG tablet Take 1 tablet (25 mcg total) by mouth daily.   methadone (DOLOPHINE) 10 MG/ML solution Take 160 mg by mouth daily.   metoCLOPramide (REGLAN) 5 MG tablet Take 0.5 tablets (2.5 mg total) by mouth 4 (four) times daily -  before meals and at bedtime.   naloxone (NARCAN) nasal spray 4 mg/0.1 mL One spray in either nostril once for known/suspected opioid overdose. May repeat every 2-3 minutes in alternating nostril til EMS arrives   omeprazole (PRILOSEC) 20 MG capsule Take 1 capsule (20 mg total) by mouth daily.   [DISCONTINUED] sertraline (ZOLOFT) 25 MG tablet Take 1 tablet (25 mg total) by mouth daily.   Accu-Chek Softclix Lancets lancets Use to check blood sugar daily for type 2 diabetes. E11.9   No facility-administered medications prior to visit.    Review of Systems  Constitutional:  Negative for chills, diaphoresis and fever.  Respiratory:  Positive for cough, chest tightness and shortness of breath. Negative for wheezing.   Cardiovascular:  Positive for chest pain, palpitations and leg swelling.  Gastrointestinal:  Positive for nausea and vomiting. Negative for blood in stool.       Objective    BP (!) 160/73 (BP Location: Left Arm, Patient Position: Sitting, Cuff Size: Normal)   Pulse 77   Temp (!) 96.9 F (  36.1 C) (Temporal)   Wt 187 lb (84.8 kg)   SpO2 100%   BMI 29.29 kg/m    Physical Exam Vitals reviewed.  Constitutional:      General: She is awake.     Appearance: Normal appearance. She is well-developed, well-groomed and overweight.  HENT:     Head: Normocephalic and atraumatic.  Cardiovascular:     Rate and Rhythm: Normal rate and regular rhythm.     Pulses: Normal pulses.          Radial pulses are 2+ on the right side and 2+ on the left side.     Heart sounds: Normal heart sounds.  Pulmonary:     Effort: Pulmonary effort is normal.     Breath sounds: Normal breath sounds. No decreased air  movement. No decreased breath sounds, wheezing, rhonchi or rales.  Musculoskeletal:     Right lower leg: 2+ Edema present.     Left lower leg: 2+ Edema present.  Neurological:     Mental Status: She is alert.  Psychiatric:        Attention and Perception: Attention normal.        Mood and Affect: Affect is labile and tearful.        Speech: Speech normal.        Behavior: Behavior normal. Behavior is cooperative.        Thought Content: Thought content normal.       No results found for any visits on 09/20/21.  Assessment & Plan      No follow-ups on file.      Problem List Items Addressed This Visit       Endocrine   Type 1 diabetes mellitus without complication (HCC)    Chronic, historic condition Does not appear to be on medication and denies current insulin use Reports her blood glucose has been dropping with some results in the 73s  States she is not eating but one time per day  Encouraged her to eat regularly scheduled meals along with snacks - protein with complex carbs  Provided guides on hypoglycemia for her so prevent emergencies  Recommend she follow up in 4 weeks with PCP to discuss management  Seek ED management for severe occurrences         Other   Depression with anxiety    Chronic, ongoing  Reports she is no longer taking Zoloft due to GI upset  States she is having increased panic and anxiety especially with SOB  Discouraged her from using any of her female companion's medications (he stated he has given her some of his Klonopin to help her calm down and sleep) as these can be dangerous with her ESRD and other medications Recommend she follow up with PCP to add a new SSRI/SNRI to assist with controlling depression and anxiety PRNs not recommended due to ESRD per [employer] and Uptodate Follow up in 4 weeks recommended       SOB (shortness of breath) - Primary    Acute, recurrent  Denies fever, cough, chills that would further support infectious  etiology but will get CXR  Suspect this may be related to volume status as she has 2+ pitting edema and states she has only done peritoneal dialysis once this week  Will check CBC, CMP, BNP as well - she has hx of chronic anemia which may be contributory as well- lab results and CXR to dictate further management  Recommend she resume regularly spaced peritoneal dialysis treatments to prevent  fluid overload  Reviewed ED precautions for weekend and to call if she has concerns Follow up as needed for progressing or persistent symptoms.       Relevant Orders   DG Chest 2 View   CBC w/Diff/Platelet   B Nat Peptide   COMPLETE METABOLIC PANEL WITH GFR     No follow-ups on file.   I, Tayli Buch E Wandalene Abrams, PA-C, have reviewed all documentation for this visit. The documentation on 09/20/21 for the exam, diagnosis, procedures, and orders are all accurate and complete.   Talitha Givens, MHS, PA-C [facility]

## 2021-09-20 NOTE — Assessment & Plan Note (Addendum)
Chronic, ongoing  Reports she is no longer taking Zoloft due to GI upset  States she is having increased panic and anxiety especially with SOB  Discouraged her from using any of her female companion's medications (he stated he has given her some of his Klonopin to help her calm down and sleep) as these can be dangerous with her ESRD and other medications Recommend she follow up with PCP to add a new SSRI/SNRI to assist with controlling depression and anxiety PRNs not recommended due to ESRD per [employer] and Uptodate Follow up in 4 weeks recommended

## 2021-09-20 NOTE — Assessment & Plan Note (Addendum)
Acute, recurrent  Denies fever, cough, chills that would further support infectious etiology but will get CXR  Suspect this may be related to volume status as she has 2+ pitting edema and states she has only done peritoneal dialysis once this week  Will check CBC, CMP, BNP as well - she has hx of chronic anemia which may be contributory as well- lab results and CXR to dictate further management  Recommend she resume regularly spaced peritoneal dialysis treatments to prevent fluid overload  Reviewed ED precautions for weekend and to call if she has concerns Follow up as needed for progressing or persistent symptoms.

## 2021-09-20 NOTE — Patient Instructions (Addendum)
Please go to this facility for the chest xray [address], [place],  [address]   I would recommend eating regularly scheduled meals - breakfast, lunch and dinner with snacks in between  These need to have protein, and a complex carb in them to help with the blood sugar drops   Please try to get the Labetolol, Metoclopramide, and Levothyroxine so you can manage your chronic conditions.   I think you should proceed with doing your dialysis treatment today and see if this helps with your breathing  If you notice the following please go to the ED for assistance: fever, increased trouble breathing, weight gain over 5 lbs, coughing anything up, increased swelling in your feet, palpitations

## 2021-09-21 ENCOUNTER — Telehealth: Payer: Self-pay | Admitting: Physician Assistant

## 2021-09-21 LAB — CBC WITH DIFFERENTIAL/PLATELET
Absolute Monocytes: 480 cells/uL (ref 200–950)
Basophils Absolute: 59 cells/uL (ref 0–200)
Basophils Relative: 0.6 %
Eosinophils Absolute: 480 cells/uL (ref 15–500)
Eosinophils Relative: 4.9 %
HCT: 21.7 % — ABNORMAL LOW (ref 35.0–45.0)
Hemoglobin: 7.1 g/dL — ABNORMAL LOW (ref 11.7–15.5)
Lymphs Abs: 1793 cells/uL (ref 850–3900)
MCH: 30.1 pg (ref 27.0–33.0)
MCHC: 32.7 g/dL (ref 32.0–36.0)
MCV: 91.9 fL (ref 80.0–100.0)
MPV: 11.4 fL (ref 7.5–12.5)
Monocytes Relative: 4.9 %
Neutro Abs: 6987 cells/uL (ref 1500–7800)
Neutrophils Relative %: 71.3 %
Platelets: 188 10*3/uL (ref 140–400)
RBC: 2.36 10*6/uL — ABNORMAL LOW (ref 3.80–5.10)
RDW: 14.8 % (ref 11.0–15.0)
Total Lymphocyte: 18.3 %
WBC: 9.8 10*3/uL (ref 3.8–10.8)

## 2021-09-21 LAB — COMPLETE METABOLIC PANEL WITH GFR
AG Ratio: 1.2 (calc) (ref 1.0–2.5)
ALT: 12 U/L (ref 6–29)
AST: 12 U/L (ref 10–30)
Albumin: 2.8 g/dL — ABNORMAL LOW (ref 3.6–5.1)
Alkaline phosphatase (APISO): 72 U/L (ref 31–125)
BUN/Creatinine Ratio: 6 (calc) (ref 6–22)
BUN: 88 mg/dL — ABNORMAL HIGH (ref 7–25)
CO2: 22 mmol/L (ref 20–32)
Calcium: 7.9 mg/dL — ABNORMAL LOW (ref 8.6–10.2)
Chloride: 99 mmol/L (ref 98–110)
Creat: 15.56 mg/dL — ABNORMAL HIGH (ref 0.50–0.99)
Globulin: 2.4 g/dL (calc) (ref 1.9–3.7)
Glucose, Bld: 54 mg/dL — ABNORMAL LOW (ref 65–139)
Potassium: 6.3 mmol/L (ref 3.5–5.3)
Sodium: 133 mmol/L — ABNORMAL LOW (ref 135–146)
Total Bilirubin: 0.2 mg/dL (ref 0.2–1.2)
Total Protein: 5.2 g/dL — ABNORMAL LOW (ref 6.1–8.1)
eGFR: 3 mL/min/{1.73_m2} — ABNORMAL LOW (ref >=60)

## 2021-09-21 LAB — BRAIN NATRIURETIC PEPTIDE: Brain Natriuretic Peptide: 1209 pg/mL — ABNORMAL HIGH (ref <100)

## 2021-09-21 NOTE — Telephone Encounter (Signed)
Attempted to reach patient twice but voicemail is not set up.Left message with her mother that she should contact answering service for clinic to speak to on-call provider for lab results and plan.

## 2021-09-23 ENCOUNTER — Telehealth: Payer: Self-pay

## 2021-09-23 MED ORDER — DOXYCYCLINE HYCLATE 100 MG PO TABS: 100.0000 mg | ORAL_TABLET | Freq: Two times a day (BID) | ORAL | 0 refills | Status: DC

## 2021-09-23 NOTE — Telephone Encounter (Signed)
LMTCB 09/23/2021.  PEC please advise when she calls back.   Thanks,   -Mickel Baas

## 2021-09-23 NOTE — Telephone Encounter (Signed)
See telephone encounter.

## 2021-09-23 NOTE — Telephone Encounter (Signed)
Pt states she hooked up her dialysis yesterday but it has been two weeks since the last time.  She saw her nephrologist in June when she was in the hospital.   Thanks,   Mickel Baas

## 2021-09-23 NOTE — Telephone Encounter (Signed)
Received call from Margarita Grizzle at [facility] with critical lab results.  Potassium is 6.3  Creatinine is 15.56  Sent tems message to [facility]

## 2021-09-23 NOTE — Telephone Encounter (Signed)
-----   Message from Almon Register, PA-C sent at 09/21/2021 12:51 PM EDT ----- Critically high Potassium level on labs. Attempted to call patient twice but no VM set up and she did not answer. Reached out to designated party per chart and instructed her that patient should reach out to office answering service to speak with on-call provider for further instructions.

## 2021-09-23 NOTE — Addendum Note (Signed)
Addended by: Talitha Givens on: 09/23/2021 12:35 PM   Modules accepted: Orders

## 2021-09-23 NOTE — Telephone Encounter (Signed)
Patient is a known dialysis patient.  Can you call and see if she has done her peritoneal dialysis recently.  When is last time she saw nephrology?

## 2021-09-24 NOTE — Telephone Encounter (Signed)
She needs to do her peritoneal denies routinely.  She needs to either set up an appointment with me or her nephrologist.

## 2021-09-30 ENCOUNTER — Telehealth: Payer: Medicaid Other | Admitting: Physician Assistant

## 2021-09-30 ENCOUNTER — Inpatient Hospital Stay
Admission: EM | Admit: 2021-09-30 | Discharge: 2021-10-03 | DRG: 682 | Disposition: A | Discharge status: Home / Self Care | Payer: Medicaid Other | Attending: Internal Medicine | Admitting: Internal Medicine

## 2021-09-30 ENCOUNTER — Emergency Department: Payer: Medicaid Other

## 2021-09-30 ENCOUNTER — Other Ambulatory Visit: Payer: Self-pay

## 2021-09-30 ENCOUNTER — Ambulatory Visit: Payer: Self-pay

## 2021-09-30 DIAGNOSIS — E875 Hyperkalemia: Secondary | ICD-10-CM | POA: Diagnosis present

## 2021-09-30 DIAGNOSIS — G253 Myoclonus: Secondary | ICD-10-CM | POA: Diagnosis present

## 2021-09-30 DIAGNOSIS — M479 Spondylosis, unspecified: Secondary | ICD-10-CM | POA: Diagnosis present

## 2021-09-30 DIAGNOSIS — R778 Other specified abnormalities of plasma proteins: Secondary | ICD-10-CM | POA: Diagnosis present

## 2021-09-30 DIAGNOSIS — R002 Palpitations: Secondary | ICD-10-CM

## 2021-09-30 DIAGNOSIS — M62838 Other muscle spasm: Secondary | ICD-10-CM

## 2021-09-30 DIAGNOSIS — E039 Hypothyroidism, unspecified: Secondary | ICD-10-CM | POA: Diagnosis present

## 2021-09-30 DIAGNOSIS — N2581 Secondary hyperparathyroidism of renal origin: Secondary | ICD-10-CM | POA: Diagnosis present

## 2021-09-30 DIAGNOSIS — F1120 Opioid dependence, uncomplicated: Secondary | ICD-10-CM

## 2021-09-30 DIAGNOSIS — Z881 Allergy status to other antibiotic agents status: Secondary | ICD-10-CM | POA: Diagnosis not present

## 2021-09-30 DIAGNOSIS — F418 Other specified anxiety disorders: Secondary | ICD-10-CM

## 2021-09-30 DIAGNOSIS — K219 Gastro-esophageal reflux disease without esophagitis: Secondary | ICD-10-CM | POA: Diagnosis present

## 2021-09-30 DIAGNOSIS — E1122 Type 2 diabetes mellitus with diabetic chronic kidney disease: Secondary | ICD-10-CM

## 2021-09-30 DIAGNOSIS — R531 Weakness: Secondary | ICD-10-CM | POA: Diagnosis present

## 2021-09-30 DIAGNOSIS — Z86010 Personal history of colonic polyps: Secondary | ICD-10-CM

## 2021-09-30 DIAGNOSIS — R1310 Dysphagia, unspecified: Secondary | ICD-10-CM | POA: Diagnosis present

## 2021-09-30 DIAGNOSIS — Z992 Dependence on renal dialysis: Secondary | ICD-10-CM

## 2021-09-30 DIAGNOSIS — Z833 Family history of diabetes mellitus: Secondary | ICD-10-CM

## 2021-09-30 DIAGNOSIS — Z7982 Long term (current) use of aspirin: Secondary | ICD-10-CM

## 2021-09-30 DIAGNOSIS — D631 Anemia in chronic kidney disease: Secondary | ICD-10-CM | POA: Diagnosis present

## 2021-09-30 DIAGNOSIS — Z91158 Patient's noncompliance with renal dialysis for other reason: Secondary | ICD-10-CM

## 2021-09-30 DIAGNOSIS — N186 End stage renal disease: Secondary | ICD-10-CM | POA: Diagnosis present

## 2021-09-30 DIAGNOSIS — Z90710 Acquired absence of both cervix and uterus: Secondary | ICD-10-CM

## 2021-09-30 DIAGNOSIS — R7989 Other specified abnormal findings of blood chemistry: Secondary | ICD-10-CM

## 2021-09-30 DIAGNOSIS — I120 Hypertensive chronic kidney disease with stage 5 chronic kidney disease or end stage renal disease: Principal | ICD-10-CM | POA: Diagnosis present

## 2021-09-30 DIAGNOSIS — Z79899 Other long term (current) drug therapy: Secondary | ICD-10-CM

## 2021-09-30 DIAGNOSIS — F17200 Nicotine dependence, unspecified, uncomplicated: Secondary | ICD-10-CM | POA: Diagnosis present

## 2021-09-30 DIAGNOSIS — E119 Type 2 diabetes mellitus without complications: Secondary | ICD-10-CM

## 2021-09-30 DIAGNOSIS — Z8249 Family history of ischemic heart disease and other diseases of the circulatory system: Secondary | ICD-10-CM | POA: Diagnosis not present

## 2021-09-30 DIAGNOSIS — R259 Unspecified abnormal involuntary movements: Secondary | ICD-10-CM | POA: Diagnosis not present

## 2021-09-30 DIAGNOSIS — D649 Anemia, unspecified: Secondary | ICD-10-CM | POA: Diagnosis not present

## 2021-09-30 DIAGNOSIS — Z880 Allergy status to penicillin: Secondary | ICD-10-CM | POA: Diagnosis not present

## 2021-09-30 DIAGNOSIS — Z818 Family history of other mental and behavioral disorders: Secondary | ICD-10-CM

## 2021-09-30 DIAGNOSIS — I151 Hypertension secondary to other renal disorders: Secondary | ICD-10-CM

## 2021-09-30 DIAGNOSIS — R0789 Other chest pain: Secondary | ICD-10-CM

## 2021-09-30 DIAGNOSIS — F17210 Nicotine dependence, cigarettes, uncomplicated: Secondary | ICD-10-CM | POA: Diagnosis present

## 2021-09-30 DIAGNOSIS — Z91119 Patient's noncompliance with dietary regimen due to unspecified reason: Secondary | ICD-10-CM

## 2021-09-30 DIAGNOSIS — E109 Type 1 diabetes mellitus without complications: Secondary | ICD-10-CM

## 2021-09-30 DIAGNOSIS — R0602 Shortness of breath: Secondary | ICD-10-CM

## 2021-09-30 LAB — CBC WITH DIFFERENTIAL/PLATELET
Abs Immature Granulocytes: 0.04 10*3/uL (ref 0.00–0.07)
Basophils Absolute: 0.1 10*3/uL (ref 0.0–0.1)
Basophils Relative: 1 %
Eosinophils Absolute: 0.6 10*3/uL — ABNORMAL HIGH (ref 0.0–0.5)
Eosinophils Relative: 6 %
HCT: 22.4 % — ABNORMAL LOW (ref 36.0–46.0)
Hemoglobin: 6.9 g/dL — ABNORMAL LOW (ref 12.0–15.0)
Immature Granulocytes: 0 %
Lymphocytes Relative: 16 %
Lymphs Abs: 1.5 10*3/uL (ref 0.7–4.0)
MCH: 29 pg (ref 26.0–34.0)
MCHC: 30.8 g/dL (ref 30.0–36.0)
MCV: 94.1 fL (ref 80.0–100.0)
Monocytes Absolute: 0.4 10*3/uL (ref 0.1–1.0)
Monocytes Relative: 5 %
Neutro Abs: 6.5 10*3/uL (ref 1.7–7.7)
Neutrophils Relative %: 72 %
Platelets: 212 10*3/uL (ref 150–400)
RBC: 2.38 MIL/uL — ABNORMAL LOW (ref 3.87–5.11)
RDW: 15.1 % (ref 11.5–15.5)
WBC: 9.1 10*3/uL (ref 4.0–10.5)
nRBC: 0 % (ref 0.0–0.2)

## 2021-09-30 LAB — BASIC METABOLIC PANEL
Anion gap: 13 (ref 5–15)
BUN: 87 mg/dL — ABNORMAL HIGH (ref 6–20)
CO2: 23 mmol/L (ref 22–32)
Calcium: 8.4 mg/dL — ABNORMAL LOW (ref 8.9–10.3)
Chloride: 101 mmol/L (ref 98–111)
Creatinine, Ser: 15.86 mg/dL — ABNORMAL HIGH (ref 0.44–1.00)
GFR, Estimated: 3 mL/min — ABNORMAL LOW (ref >60)
Glucose, Bld: 115 mg/dL — ABNORMAL HIGH (ref 70–99)
Potassium: 5.6 mmol/L — ABNORMAL HIGH (ref 3.5–5.1)
Sodium: 137 mmol/L (ref 135–145)

## 2021-09-30 LAB — COMPREHENSIVE METABOLIC PANEL
ALT: 15 U/L (ref 0–44)
AST: 14 U/L — ABNORMAL LOW (ref 15–41)
Albumin: 3 g/dL — ABNORMAL LOW (ref 3.5–5.0)
Alkaline Phosphatase: 59 U/L (ref 38–126)
Anion gap: 13 (ref 5–15)
BUN: 86 mg/dL — ABNORMAL HIGH (ref 6–20)
CO2: 21 mmol/L — ABNORMAL LOW (ref 22–32)
Calcium: 8.4 mg/dL — ABNORMAL LOW (ref 8.9–10.3)
Chloride: 102 mmol/L (ref 98–111)
Creatinine, Ser: 16.36 mg/dL — ABNORMAL HIGH (ref 0.44–1.00)
GFR, Estimated: 3 mL/min — ABNORMAL LOW (ref >60)
Glucose, Bld: 63 mg/dL — ABNORMAL LOW (ref 70–99)
Potassium: 6.6 mmol/L (ref 3.5–5.1)
Sodium: 136 mmol/L (ref 135–145)
Total Bilirubin: 0.4 mg/dL (ref 0.3–1.2)
Total Protein: 6.1 g/dL — ABNORMAL LOW (ref 6.5–8.1)

## 2021-09-30 LAB — PREPARE RBC (CROSSMATCH)

## 2021-09-30 LAB — HEPATITIS B SURFACE ANTIGEN: Hepatitis B Surface Ag: NONREACTIVE

## 2021-09-30 LAB — PHOSPHORUS: Phosphorus: 9.6 mg/dL — ABNORMAL HIGH (ref 2.5–4.6)

## 2021-09-30 LAB — TROPONIN I (HIGH SENSITIVITY)
Troponin I (High Sensitivity): 62 ng/L — ABNORMAL HIGH (ref <18)
Troponin I (High Sensitivity): 56 ng/L — ABNORMAL HIGH (ref <18)

## 2021-09-30 LAB — CBG MONITORING, ED: Glucose-Capillary: 109 mg/dL — ABNORMAL HIGH (ref 70–99)

## 2021-09-30 LAB — MAGNESIUM: Magnesium: 2.3 mg/dL (ref 1.7–2.4)

## 2021-09-30 MED ORDER — SODIUM CHLORIDE 0.9% FLUSH
3.0000 mL | Freq: Two times a day (BID) | INTRAVENOUS | Status: DC
  Administered 2021-09-30 – 2021-10-03 (×5): 3 mL via INTRAVENOUS

## 2021-09-30 MED ORDER — ONDANSETRON HCL 4 MG PO TABS: 4.0000 mg | ORAL_TABLET | Freq: Four times a day (QID) | ORAL | Status: DC | PRN

## 2021-09-30 MED ORDER — DARBEPOETIN ALFA 40 MCG/0.4ML IJ SOSY
25.0000 ug | PREFILLED_SYRINGE | INTRAMUSCULAR | Status: DC
  Filled 2021-09-30: qty 0.4

## 2021-09-30 MED ORDER — SODIUM CHLORIDE 0.9 % IV SOLN
10.0000 mL/h | Freq: Once | INTRAVENOUS | Status: AC
  Administered 2021-09-30: 10 mL/h via INTRAVENOUS

## 2021-09-30 MED ORDER — SODIUM BICARBONATE 8.4 % IV SOLN
50.0000 meq | Freq: Once | INTRAVENOUS | Status: AC
  Administered 2021-09-30: 50 meq via INTRAVENOUS
  Filled 2021-09-30: qty 50

## 2021-09-30 MED ORDER — DELFLEX-LC/2.5% DEXTROSE 394 MOSM/L IP SOLN
INTRAPERITONEAL | Status: DC
  Filled 2021-09-30 (×14): qty 3000

## 2021-09-30 MED ORDER — ONDANSETRON HCL 4 MG/2ML IJ SOLN: 4.0000 mg | Freq: Four times a day (QID) | INTRAMUSCULAR | Status: DC | PRN

## 2021-09-30 MED ORDER — DEXTROSE 50 % IV SOLN
1.0000 | Freq: Once | INTRAVENOUS | Status: AC
  Administered 2021-09-30: 50 mL via INTRAVENOUS
  Filled 2021-09-30: qty 50

## 2021-09-30 MED ORDER — PANTOPRAZOLE SODIUM 40 MG PO TBEC
40.0000 mg | DELAYED_RELEASE_TABLET | Freq: Every day | ORAL | Status: DC
  Administered 2021-10-01 – 2021-10-03 (×3): 40 mg via ORAL
  Filled 2021-09-30 (×3): qty 1

## 2021-09-30 MED ORDER — ACETAMINOPHEN 325 MG PO TABS: 650.0000 mg | ORAL_TABLET | Freq: Four times a day (QID) | ORAL | Status: DC | PRN

## 2021-09-30 MED ORDER — LEVOTHYROXINE SODIUM 25 MCG PO TABS
25.0000 ug | ORAL_TABLET | Freq: Every day | ORAL | Status: DC
  Administered 2021-10-01 – 2021-10-03 (×3): 25 ug via ORAL
  Filled 2021-09-30 (×3): qty 1

## 2021-09-30 MED ORDER — ALBUTEROL SULFATE (2.5 MG/3ML) 0.083% IN NEBU
5.0000 mg | INHALATION_SOLUTION | Freq: Once | RESPIRATORY_TRACT | Status: AC
  Administered 2021-09-30: 5 mg via RESPIRATORY_TRACT
  Filled 2021-09-30: qty 6

## 2021-09-30 MED ORDER — GENTAMICIN SULFATE 0.1 % EX CREA
1.0000 | TOPICAL_CREAM | Freq: Every day | CUTANEOUS | Status: DC
Start: 2021-09-30 — End: 2021-10-03
  Administered 2021-10-03: 1 via TOPICAL
  Filled 2021-09-30: qty 15

## 2021-09-30 MED ORDER — CALCIUM GLUCONATE-NACL 1-0.675 GM/50ML-% IV SOLN
1.0000 g | Freq: Once | INTRAVENOUS | Status: AC
  Administered 2021-09-30: 1000 mg via INTRAVENOUS
  Filled 2021-09-30: qty 50

## 2021-09-30 MED ORDER — SODIUM ZIRCONIUM CYCLOSILICATE 10 G PO PACK
10.0000 g | PACK | Freq: Once | ORAL | Status: AC
Start: 2021-09-30 — End: 2021-09-30
  Administered 2021-09-30: 10 g via ORAL
  Filled 2021-09-30: qty 1

## 2021-09-30 MED ORDER — ACETAMINOPHEN 650 MG RE SUPP: 650.0000 mg | Freq: Four times a day (QID) | RECTAL | Status: DC | PRN

## 2021-09-30 MED ORDER — METHADONE HCL 10 MG/ML PO CONC
160.0000 mg | Freq: Every day | ORAL | Status: DC
  Administered 2021-10-01: 160 mg via ORAL
  Filled 2021-09-30: qty 20

## 2021-09-30 MED ORDER — LABETALOL HCL 100 MG PO TABS
100.0000 mg | ORAL_TABLET | Freq: Three times a day (TID) | ORAL | Status: DC
  Administered 2021-09-30 – 2021-10-03 (×7): 100 mg via ORAL
  Filled 2021-09-30 (×10): qty 1

## 2021-09-30 MED ORDER — INSULIN ASPART 100 UNIT/ML IJ SOLN
5.0000 [IU] | Freq: Once | INTRAMUSCULAR | Status: AC
  Administered 2021-09-30: 5 [IU] via INTRAVENOUS
  Filled 2021-09-30: qty 1

## 2021-09-30 NOTE — ED Triage Notes (Signed)
Pt c/o increasing generalized spasms x2 days.  Pt does peritoneal dialysis at home.  Pt reports she was told her potassium was elevated on 7/14 and she was told to "just continue her dialysis."  Denies pain.

## 2021-09-30 NOTE — Assessment & Plan Note (Signed)
Patient refused hemodialysis which would probably make her symptoms go away quicker.  Continue peritoneal dialysis.

## 2021-09-30 NOTE — H&P (Signed)
History and Physical    Patient: Nichole Wiley TVN:[id number] DOB: 20-Mar-1980 DOA: 09/30/2021 DOS: the patient was seen and examined on 09/30/2021 PCP: Jearld Fenton, NP  Patient coming from: Home  Chief Complaint:  Chief Complaint  Patient presents with   Spasms   HPI: Nichole Wiley is a 41 y.o. female with medical history significant for diabetes mellitus with complications of end-stage renal disease on peritoneal dialysis, hypertension, GERD who presents to the ER for evaluation of spasms and cramps which initially started in her extremities but is now generalized for several days.  She states that the spasms are severe enough that she is beginning to drop things and she had a virtual visit today with her primary care provider and was advised to go to the emergency room for further evaluation. She also notes that she has had several episodes of hypoglycemia over the last several days with blood sugar in the 20s and has had to drink a lot more orange juice which she does not usually do.  She admits to increased fluid intake over the last several days because she felt she was dehydrated. She has a history of noncompliance with her peritoneal dialysis but states that she has done it as recommended over the last 3 days. Chart review shows that she had a potassium of 6.3 about 10 days ago but was advised to continue her dialysis peritoneal dialysis. She notes significant lower extremity swelling but denies having any shortness of breath, no chest pain, no dizziness, no lightheadedness, no headache, no cough, no dizziness, no lightheadedness, no melena stools, no hematochezia, no hematemesis, no blurred vision or focal deficit. She denies NSAID use Labs today show significant hyperkalemia as well as a hemoglobin of 6.9 compared to baseline of 7.1 about 10 days ago. She received Lokelma, calcium gluconate, insulin, dextrose and albuterol for hyperkalemia.    Review of Systems: As mentioned  in the history of present illness. All other systems reviewed and are negative. Past Medical History:  Diagnosis Date   Abdominal mass    Arthritis    back   Chronic kidney disease    Colon polyp    Diabetes 1.5, managed as type 1 ([place])    Drug addiction ([place])    hx (pain pills after c-sect)   GERD (gastroesophageal reflux disease)    Hypertension    Pelvic floor dysfunction    Urine incontinence    Past Surgical History:  Procedure Laterality Date   ABDOMINAL HYSTERECTOMY  2005   CESAREAN SECTION  2003   COLONOSCOPY WITH PROPOFOL N/A 09/14/2015   Procedure: COLONOSCOPY WITH PROPOFOL;  Surgeon: Lucilla Lame, MD;  Location: [facility];  Service: Endoscopy;  Laterality: N/A;  Diabetic - insulin   CYSTOSCOPY W/ RETROGRADES Right 07/06/2017   Procedure: CYSTOSCOPY WITH RETROGRADE PYELOGRAM;  Surgeon: Hollice Espy, MD;  Location: [facility];  Service: Urology;  Laterality: Right;   CYSTOSCOPY W/ URETERAL STENT PLACEMENT Right 06/06/2017   Procedure: CYSTOSCOPY WITH RETROGRADE PYELOGRAM/URETERAL STENT PLACEMENT;  Surgeon: Lucas Mallow, MD;  Location: [facility];  Service: Urology;  Laterality: Right;   CYSTOSCOPY W/ URETERAL STENT PLACEMENT Right 07/06/2017   Procedure: CYSTOSCOPY WITH STENT REPLACEMENT;  Surgeon: Hollice Espy, MD;  Location: [facility];  Service: Urology;  Laterality: Right;   ESOPHAGOGASTRODUODENOSCOPY (EGD) WITH PROPOFOL N/A 06/19/2016   Procedure: ESOPHAGOGASTRODUODENOSCOPY (EGD) WITH PROPOFOL;  Surgeon: Jonathon Bellows, MD;  Location: [facility];  Service: Endoscopy;  Laterality: N/A;   LAPAROSCOPY  LYSIS OF ADHESION     POLYPECTOMY  09/14/2015   Procedure: POLYPECTOMY;  Surgeon: Lucilla Lame, MD;  Location: [facility];  Service: Endoscopy;;   RIGHT OOPHORECTOMY  2012   TONSILLECTOMY  1987   and adenoids   TUBAL LIGATION  2004   VULVECTOMY  2011   lipoma   Social History:  reports that she has been smoking cigarettes. She has a 12.00 pack-year  smoking history. She has never used smokeless tobacco. She reports that she does not drink alcohol and does not use drugs.  Allergies  Allergen Reactions   Cephalexin Other (See Comments) and Rash    Unknown   Penicillins Rash    Has patient had a PCN reaction causing immediate rash, facial/tongue/throat swelling, SOB or lightheadedness with hypotension: No Has patient had a PCN reaction causing severe rash involving mucus membranes or skin necrosis: No Has patient had a PCN reaction that required hospitalization: No Has patient had a PCN reaction occurring within the last 10 years: No If all of the above answers are "NO", then may proceed with Cephalosporin use.    Family History  Problem Relation Age of Onset   Depression Mother    Diabetes Father    Heart disease Maternal Grandmother    Depression Maternal Grandmother    Dementia Maternal Grandmother    Diabetes Maternal Grandmother    Diabetes Paternal Grandmother     Prior to Admission medications   Medication Sig Start Date End Date Taking? Authorizing Provider  aspirin EC 81 MG tablet Take 81 mg by mouth daily.   Yes [provider]  labetalol (NORMODYNE) 100 MG tablet Take 1 tablet (100 mg total) by mouth 3 (three) times daily. 08/19/21  Yes Jearld Fenton, NP  methadone (DOLOPHINE) 10 MG/ML solution Take 160 mg by mouth daily.   Yes [provider]  omeprazole (PRILOSEC) 20 MG capsule Take 1 capsule (20 mg total) by mouth daily. 08/07/21  Yes Jearld Fenton, NP  Accu-Chek Softclix Lancets lancets Use to check blood sugar daily for type 2 diabetes. E11.9 06/27/21   Jearld Fenton, NP  doxycycline (VIBRA-TABS) 100 MG tablet Take 1 tablet (100 mg total) by mouth 2 (two) times daily for 7 days. Patient not taking: Reported on 09/30/2021 09/23/21 09/30/21  Mecum, Junie Panning E, PA-C  epoetin alfa (EPOGEN) 40000 UNIT/ML injection Inject 0.5 mLs (20,000 Units total) into the skin once a week. Patient not taking: Reported  on 09/30/2021 08/16/21   Swayze, Ava, DO  gentamicin cream (GARAMYCIN) 0.1 % Apply 1 application. topically daily. Patient not taking: Reported on 09/30/2021 08/10/21   Swayze, Ava, DO  glucose blood (ACCU-CHEK GUIDE) test strip 1 each by Other route See admin instructions. 07/08/21   Jearld Fenton, NP  levothyroxine (SYNTHROID) 25 MCG tablet Take 1 tablet (25 mcg total) by mouth daily. 08/08/21   Jearld Fenton, NP  metoCLOPramide (REGLAN) 5 MG tablet Take 0.5 tablets (2.5 mg total) by mouth 4 (four) times daily -  before meals and at bedtime. Patient not taking: Reported on 09/30/2021 08/10/21   Swayze, Ava, DO  naloxone (NARCAN) nasal spray 4 mg/0.1 mL One spray in either nostril once for known/suspected opioid overdose. May repeat every 2-3 minutes in alternating nostril til EMS arrives 04/29/21   [provider]    Physical Exam: Vitals:   09/30/21 0931 09/30/21 0932 09/30/21 1323 09/30/21 1510  BP: (!) 167/89  (!) 179/91 (!) 170/89  Pulse: 84  83  Resp: 18  16 19   Temp: 98.2 F (36.8 C)     TempSrc: Oral     SpO2: 100%  98%   Weight:  84.8 kg    Height:  5' 7"  (1.702 m)     Physical Exam Vitals and nursing note reviewed.  Constitutional:      Appearance: Normal appearance.     Comments: Jerking movements involving her face and extremities  HENT:     Head: Normocephalic and atraumatic.     Nose: Nose normal.     Mouth/Throat:     Mouth: Mucous membranes are moist.  Eyes:     Comments: Pale conjunctive  Cardiovascular:     Rate and Rhythm: Normal rate and regular rhythm.  Pulmonary:     Effort: Pulmonary effort is normal.     Breath sounds: Normal breath sounds.  Abdominal:     General: Abdomen is flat. Bowel sounds are normal.     Palpations: Abdomen is soft.  Musculoskeletal:     Cervical back: Normal range of motion.     Right lower leg: Edema present.     Left lower leg: Edema present.  Skin:    General: Skin is warm and dry.  Neurological:     General: No  focal deficit present.     Mental Status: She is alert.  Psychiatric:        Mood and Affect: Mood normal.        Behavior: Behavior normal.     Data Reviewed: Relevant notes from primary care and specialist visits, past discharge summaries as available in EHR, including Care Everywhere. Prior diagnostic testing as pertinent to current admission diagnoses Updated medications and problem lists for reconciliation ED course, including vitals, labs, imaging, treatment and response to treatment Triage notes, nursing and pharmacy notes and ED provider's notes Notable results as noted in HPI Labs reviewed.  Troponin 62 >> 59, magnesium 2.3, sodium 136, potassium 6.6, chloride 102, bicarb 21, glucose 63, BUN 86, creatinine 16.36, calcium 8.4, total protein 6.1, albumin 3.0, AST 14, ALT 15, alk phos 59, total bilirubin 0.4, white count 9.1, hemoglobin 6.9, hematocrit 22.4, platelet count 212 Chest x-ray reviewed by me shows Mild bilateral interstitial opacities most pronounced in the perihilar and bibasilar regions. Findings may reflect interstitial edema versus atypical/viral infection. Twelve-lead EKG reviewed by me shows normal sinus rhythm.  Right axis deviation.  ST and T wave changes in the lateral leads. There are no new results to review at this time.  Assessment and Plan: * Symptomatic anemia Most likely related to patient's end-stage renal disease. We will transfuse 1 unit of packed RBC Continue ESA  Hyperkalemia Most likely related to patient's known history of end-stage renal disease as well as dietary indiscretion. Does not have EKG changes Patient received insulin, dextrose, albuterol, sodium bicarbonate and Lokelma. We will plan to repeat potassium levels after peritoneal dialysis Place patient on cardiac monitor  ESRD (end stage renal disease) ([place]) Patient has a history of end-stage renal disease and is on peritoneal dialysis Consult nephrology for renal replacement therapy  during this admission  Nicotine dependence Smoking cessation was discussed with patient in detail She declines a nicotine transdermal patch at this time  Hypothyroidism Stable Continue Synthroid  GERD (gastroesophageal reflux disease) Stable Continue PPI  Diabetes mellitus ([place]) Place patient on a consistent carbohydrate diet Blood sugar checks with meals      Advance Care Planning:   Code Status: Full Code   Consults: Nephrology  Family Communication: Greater than 50% of time was spent discussing patient's condition and plan of care with her at the bedside.  All questions and concerns have been addressed.  She verbalizes understanding and agrees with the plan.  Severity of Illness: The appropriate patient status for this patient is INPATIENT. Inpatient status is judged to be reasonable and necessary in order to provide the required intensity of service to ensure the patient's safety. The patient's presenting symptoms, physical exam findings, and initial radiographic and laboratory data in the context of their chronic comorbidities is felt to place them at high risk for further clinical deterioration. Furthermore, it is not anticipated that the patient will be medically stable for discharge from the hospital within 2 midnights of admission.   * I certify that at the point of admission it is my clinical judgment that the patient will require inpatient hospital care spanning beyond 2 midnights from the point of admission due to high intensity of service, high risk for further deterioration and high frequency of surveillance required.*  Author: Collier Bullock, MD 09/30/2021 4:11 PM  For on call review [email or web address].

## 2021-09-30 NOTE — ED Notes (Signed)
Dialysis in with pt now.

## 2021-09-30 NOTE — Telephone Encounter (Signed)
  Chief Complaint: Muscle spasms Symptoms: wide spread body muscle spasms, some difficulty breathing Frequency: weeks much worse the past 2 days Pertinent Negatives: Patient denies  Disposition: '[x]'$ ED /'[]'$ Urgent Care (no appt availability in office) / '[]'$ Appointment(In office/virtual)/ '[]'$  [facility]/ '[]'$ Home Care/ '[]'$ Refused Recommended Disposition /'[]'$ [facility]/ '[]'$  Follow-up with PCP Additional Notes: Pt states that muscle spasms started about 2 weeks ago but have gotten much worse in the past 2 days. Pt had a video visit this morning. At that visit pt was recommended to go to UC or follow up with PCP. PT now has some difficulty breathing. PT will go to ED. Someone else will drive her.  Reason for Disposition  [1] New-onset muscle jerks (twitches, spasms) AND [2] present now  Answer Assessment - Initial Assessment Questions 1. APPEARANCE of MOVEMENT: "What did the jerking or twitching look like?" (e.g., body area)     Muscle spasms 2. ONSET: "When did this start happening?" (e.g., hours, days, weeks, months ago)     Weeks  - much worse past 2 days 3. DURATION: "How long does the jerk, twitch, or spasm last?"     Ongoing 4. FREQUENCY:  "How often does this happen?"      Constant 5. WHEN: "When does this happen?" (e.g., while awake, while falling asleep, while sleeping)     constantly 6. CAUSE: "What do you think caused the spasms?"     unknown 7. OTHER SYMPTOMS: "Are there any other symptoms?" (e.g., fever, headache)     Difficulty breathing 8. PREGNANCY: "Is there any chance you are pregnant?" "When was your last menstrual period?"  Protocols used: Muscle Jerks - Tics - [facility]

## 2021-09-30 NOTE — ED Provider Triage Note (Signed)
Emergency Medicine Provider Triage Evaluation Note  Nichole Wiley , a 41 y.o. female  was evaluated in triage.  Pt complains of generalized body spasms x 2 days.  Patient does peritoneal dialysis at home and was told that her potassium was elevated on 09/20/2021 but was told to continue with her dialysis as usual.  Review of Systems  Positive: Generalized continuous spasms. Negative: No fever or chills.  Physical Exam  BP (!) 167/89 (BP Location: Left Arm)   Pulse 84   Temp 98.2 F (36.8 C) (Oral)   Resp 18   Ht '5\' 7"'$  (1.702 m)   Wt 84.8 kg   SpO2 100%   BMI 29.29 kg/m  Gen:   Awake, no distress   Resp:  Normal effort lungs clear bilaterally. MSK:   Frequent erratic spasms noted generalized upper body noted especially. Other:    Medical Decision Making  Medically screening exam initiated at 9:33 AM.  Appropriate orders placed.  Nichole Wiley was informed that the remainder of the evaluation will be completed by another provider, this initial triage assessment does not replace that evaluation, and the importance of remaining in the ED until their evaluation is complete.     Johnn Hai, PA-C 09/30/21 1000

## 2021-09-30 NOTE — Assessment & Plan Note (Signed)
Stable Continue Synthroid 

## 2021-09-30 NOTE — Telephone Encounter (Signed)
Agree with ED eval.  She is noncompliant with PD.  She is anemic, elevated creatinine, decreased GFR as expected with ESRD.  Will review ED note.

## 2021-09-30 NOTE — ED Notes (Signed)
Consent signed for blood transfusion.  1st unit prbc's infusing.  Pt alert.

## 2021-09-30 NOTE — Progress Notes (Signed)
RN arrived in patient room to end manual PD exchange and cap patient PD catheter. Patient had already ended manual drain herself. Patient had a guest present. Neither wore a mask or gloves. Education on risk of infection and PD catheter/treatment. Pt catheter capped by RN. Site clean, dry and intact. Manual drain x2 complete. Report to ED RN (Tiffany).

## 2021-09-30 NOTE — ED Provider Notes (Signed)
[facility] Provider Note    Event Date/Time   First MD Initiated Contact with Patient 09/30/21 1122     (approximate)   History   Spasms   HPI  Nichole Wiley is a 41 y.o. female here with spasms.  The patient has a history of end-stage renal disease on peritoneal dialysis.  She reportedly was hyperkalemic on outpatient labs about a week ago.  She has been trying to do peritoneal dialysis to fix this at home.  She states that over the last several days, she has had worsening weakness, some shortness of breath, leg swelling, and intermittent, diffuse spasms.  No chest pain or shortness of breath.  No syncope.  She denies any fevers or chills.  She states she has been adherent with her peritoneal dialysis.  She declines any hemodialysis.  No other complaints.     Physical Exam   Triage Vital Signs: ED Triage Vitals  Enc Vitals Group     BP 09/30/21 0931 (!) 167/89     Pulse Rate 09/30/21 0931 84     Resp 09/30/21 0931 18     Temp 09/30/21 0931 98.2 F (36.8 C)     Temp Source 09/30/21 0931 Oral     SpO2 09/30/21 0931 100 %     Weight 09/30/21 0932 187 lb (84.8 kg)     Height 09/30/21 0932 '5\' 7"'$  (1.702 m)     Head Circumference --      Peak Flow --      Pain Score 09/30/21 0932 0     Pain Loc --      Pain Edu? --      Excl. in [place]? --     Most recent vital signs: Vitals:   09/30/21 1323 09/30/21 1510  BP: (!) 179/91 (!) 170/89  Pulse: 83   Resp: 16 19  Temp:    SpO2: 98%      General: Awake, no distress.  CV:  Good peripheral perfusion.  Regular rate and rhythm.  No murmurs or rubs. Resp:  Normal effort.  Lungs clear to auscultation bilaterally. Abd:  No distention.  No tenderness.  Peritoneal dialysis catheter in place, no significant tenderness or surrounding erythema or induration. Other:  Intermittent spasm-like jerks, but patient remains awake and alert.  No seizure-like activity.  Normal tone throughout.   ED Results /  Procedures / Treatments   Labs (all labs ordered are listed, but only abnormal results are displayed) Labs Reviewed  CBC WITH DIFFERENTIAL/PLATELET - Abnormal; Notable for the following components:      Result Value   RBC 2.38 (*)    Hemoglobin 6.9 (*)    HCT 22.4 (*)    Eosinophils Absolute 0.6 (*)    All other components within normal limits  COMPREHENSIVE METABOLIC PANEL - Abnormal; Notable for the following components:   Potassium 6.6 (*)    CO2 21 (*)    Glucose, Bld 63 (*)    BUN 86 (*)    Creatinine, Ser 16.36 (*)    Calcium 8.4 (*)    Total Protein 6.1 (*)    Albumin 3.0 (*)    AST 14 (*)    GFR, Estimated 3 (*)    All other components within normal limits  PHOSPHORUS - Abnormal; Notable for the following components:   Phosphorus 9.6 (*)    All other components within normal limits  CBG MONITORING, ED - Abnormal; Notable for the following components:   Glucose-Capillary  109 (*)    All other components within normal limits  TROPONIN I (HIGH SENSITIVITY) - Abnormal; Notable for the following components:   Troponin I (High Sensitivity) 62 (*)    All other components within normal limits  TROPONIN I (HIGH SENSITIVITY) - Abnormal; Notable for the following components:   Troponin I (High Sensitivity) 56 (*)    All other components within normal limits  MAGNESIUM  HEPATITIS B SURFACE ANTIGEN  HEPATITIS B E ANTIBODY  HEPATITIS B SURFACE ANTIBODY, QUANTITATIVE  BASIC METABOLIC PANEL  PREPARE RBC (CROSSMATCH)  TYPE AND SCREEN     EKG Normal sinus rhythm, ventricular rate 79.  PR 154, QRS 90, QTc 440.  No acute ST elevations or depressions.   RADIOLOGY Chest x-ray: Bilateral interstitial opacities concerning for interstitial edema   I also independently reviewed and agree with radiologist interpretations.   PROCEDURES:  Critical Care performed: Yes, see critical care procedure note(s)  .Critical Care  Performed by: Duffy Bruce, MD Authorized by:  Duffy Bruce, MD   Critical care provider statement:    Critical care time (minutes):  30   Critical care time was exclusive of:  Separately billable procedures and treating other patients   Critical care was necessary to treat or prevent imminent or life-threatening deterioration of the following conditions:  Cardiac failure, circulatory failure, respiratory failure and metabolic crisis   Critical care was time spent personally by me on the following activities:  Development of treatment plan with patient or surrogate, discussions with consultants, evaluation of patient's response to treatment, examination of patient, ordering and review of laboratory studies, ordering and review of radiographic studies, ordering and performing treatments and interventions, pulse oximetry, re-evaluation of patient's condition and review of old charts   Care discussed with: admitting provider       MEDICATIONS ORDERED IN ED: Medications  gentamicin cream (GARAMYCIN) 0.1 % 1 Application (has no administration in time range)  dialysis solution 2.5% low-MG/low-CA dianeal solution (has no administration in time range)  methadone (DOLOPHINE) 10 MG/ML solution 160 mg (has no administration in time range)  labetalol (NORMODYNE) tablet 100 mg (has no administration in time range)  levothyroxine (SYNTHROID) tablet 25 mcg (has no administration in time range)  pantoprazole (PROTONIX) EC tablet 40 mg (has no administration in time range)  Darbepoetin Alfa (ARANESP) injection 25 mcg (has no administration in time range)  sodium chloride flush (NS) 0.9 % injection 3 mL (has no administration in time range)  acetaminophen (TYLENOL) tablet 650 mg (has no administration in time range)    Or  acetaminophen (TYLENOL) suppository 650 mg (has no administration in time range)  ondansetron (ZOFRAN) tablet 4 mg (has no administration in time range)    Or  ondansetron (ZOFRAN) injection 4 mg (has no administration in time  range)  insulin aspart (novoLOG) injection 5 Units (5 Units Intravenous Given 09/30/21 1202)  dextrose 50 % solution 50 mL (50 mLs Intravenous Given 09/30/21 1159)  sodium zirconium cyclosilicate (LOKELMA) packet 10 g (10 g Oral Given 09/30/21 1222)  calcium gluconate 1 g/ 50 mL sodium chloride IVPB (0 mg Intravenous Stopped 09/30/21 1257)  albuterol (PROVENTIL) (2.5 MG/3ML) 0.083% nebulizer solution 5 mg (5 mg Nebulization Given 09/30/21 1203)  0.9 %  sodium chloride infusion (10 mL/hr Intravenous New Bag/Given 09/30/21 1514)  dextrose 50 % solution 50 mL (50 mLs Intravenous Given 09/30/21 1320)  sodium bicarbonate injection 50 mEq (50 mEq Intravenous Given 09/30/21 1446)     IMPRESSION / MDM / ASSESSMENT  AND PLAN / ED COURSE  I reviewed the triage vital signs and the nursing notes.                               The patient is on the cardiac monitor to evaluate for evidence of arrhythmia and/or significant heart rate changes.   Ddx:  Differential includes the following, with pertinent life- or limb-threatening emergencies considered:  Hyperkalemia in setting of dialysis nonadherence, hypercalcemia, primary tremors, polypharmacy  Patient's presentation is most consistent with acute presentation with potential threat to life or bodily function.  MDM:  41 year old female here with significant hyperkalemia.  CBC shows no significant leukocytosis.  She does have acute on chronic anemia.  We will plan to transfuse 1 unit.  CMP shows potassium of 6.6.  This is consistent with her outpatient value of 6.310 days ago.  Surprisingly, EKG shows no acute changes.  Troponin minimally elevated.  EKG nonischemic.   Patient temporized with IV insulin, D50, calcium, albuterol, Lokelma, as well as a dose of bicarbonate.  EKG shows no peaked T waves.  Intervals normal.  Otherwise, case discussed with [name] of nephrology.  Patient will be arranged for peritoneal dialysis.  He recommended hemodialysis but  patient refuses.   MEDICATIONS GIVEN IN ED: Medications  gentamicin cream (GARAMYCIN) 0.1 % 1 Application (has no administration in time range)  dialysis solution 2.5% low-MG/low-CA dianeal solution (has no administration in time range)  methadone (DOLOPHINE) 10 MG/ML solution 160 mg (has no administration in time range)  labetalol (NORMODYNE) tablet 100 mg (has no administration in time range)  levothyroxine (SYNTHROID) tablet 25 mcg (has no administration in time range)  pantoprazole (PROTONIX) EC tablet 40 mg (has no administration in time range)  Darbepoetin Alfa (ARANESP) injection 25 mcg (has no administration in time range)  sodium chloride flush (NS) 0.9 % injection 3 mL (has no administration in time range)  acetaminophen (TYLENOL) tablet 650 mg (has no administration in time range)    Or  acetaminophen (TYLENOL) suppository 650 mg (has no administration in time range)  ondansetron (ZOFRAN) tablet 4 mg (has no administration in time range)    Or  ondansetron (ZOFRAN) injection 4 mg (has no administration in time range)  insulin aspart (novoLOG) injection 5 Units (5 Units Intravenous Given 09/30/21 1202)  dextrose 50 % solution 50 mL (50 mLs Intravenous Given 09/30/21 1159)  sodium zirconium cyclosilicate (LOKELMA) packet 10 g (10 g Oral Given 09/30/21 1222)  calcium gluconate 1 g/ 50 mL sodium chloride IVPB (0 mg Intravenous Stopped 09/30/21 1257)  albuterol (PROVENTIL) (2.5 MG/3ML) 0.083% nebulizer solution 5 mg (5 mg Nebulization Given 09/30/21 1203)  0.9 %  sodium chloride infusion (10 mL/hr Intravenous New Bag/Given 09/30/21 1514)  dextrose 50 % solution 50 mL (50 mLs Intravenous Given 09/30/21 1320)  sodium bicarbonate injection 50 mEq (50 mEq Intravenous Given 09/30/21 1446)     Consults:  [name], Nephrology Dr. Francine Graven, Hospitalist   EMR reviewed  Prior Nephrology visits     FINAL CLINICAL IMPRESSION(S) / ED DIAGNOSES   Final diagnoses:  None     Rx / DC  Orders   ED Discharge Orders     None        Note:  This document was prepared using Dragon voice recognition software and may include unintentional dictation errors.   Duffy Bruce, MD 09/30/21 [phone]

## 2021-09-30 NOTE — Assessment & Plan Note (Signed)
Place patient on a consistent carbohydrate diet Blood sugar checks with meals

## 2021-09-30 NOTE — Progress Notes (Addendum)
[facility]  ROUNDING NOTE   Subjective:   Nichole Wiley is a 41 year old female with past medical history including GERD, hypertension, arthritis, diabetes, and end-stage renal disease on peritoneal dialysis.  Patient presents to the emergency department with complaints of muscle spasms.  Patient will be admitted for Symptomatic anemia [D64.9]  Patient is known to our practice and is followed with outpatient dialysis at [facility], supervised by [name].  Patient states she has been a PD patient for almost 2 years, and completes her treatments nightly.  Patient is seen resting on stretcher, no family at bedside.  Patient states she has been having a lot of low glucose readings recently.  And when her blood sugar drops, they tell her to drink orange juice.  She states her blood sugar has been 40 at times, she has been drinking a lot of orange juice to improve it.  She did not realize or his juice had a lot of potassium.  She states the muscles tremors started a couple days prior and have recently gotten worse.  Denies nausea, vomiting, or diarrhea.  Denies chest pain or other discomfort.  Denies shortness of breath or cough.  Patient states she was able to complete her dialysis treatment last night.  Due to the increase in fluid intake, she has began to have lower extremity edema.  She states she has transitioned her dialysate to 2.5% bags to manage edema.  Labs on ED arrival include potassium 6.6, serum bicarb 21, BUN 86, calcium 8.4, creatinine 16.36 with GFR 3, phosphorus 9.6, albumin 3.0, and hemoglobin 6.9.  Chest x-ray shows mild bilateral interstitial opacities concerning for interstitial edema versus atypical/viral infection.  We have been consulted to help manage hyperkalemia.   Objective:  Vital signs in last 24 hours:  Temp:  [98.2 F (36.8 C)] 98.2 F (36.8 C) (07/24 0931) Pulse Rate:  [83-84] 83 (07/24 1323) Resp:  [16-19] 19 (07/24 1510) BP: (167-179)/(89-91)  170/89 (07/24 1510) SpO2:  [98 %-100 %] 98 % (07/24 1323) Weight:  [84.8 kg] 84.8 kg (07/24 0932)  Weight change:  Filed Weights   09/30/21 0932  Weight: 84.8 kg    Intake/Output: No intake/output data recorded.   Intake/Output this shift:  No intake/output data recorded.  Physical Exam: General: Restless, muscle twitching  Head: Normocephalic, atraumatic. Moist oral mucosal membranes  Eyes: Anicteric  Lungs:  Clear to auscultation, normal effort, room air  Heart: Regular rate and rhythm  Abdomen:  Soft, nontender, PD catheter  Extremities: 1+ peripheral edema.  Neurologic: Nonfocal, moving all four extremities  Skin: No lesions  Access: PD catheter    Basic Metabolic Panel: Recent Labs  Lab 09/30/21 0953  NA 136  K 6.6*  CL 102  CO2 21*  GLUCOSE 63*  BUN 86*  CREATININE 16.36*  CALCIUM 8.4*  MG 2.3  PHOS 9.6*    Liver Function Tests: Recent Labs  Lab 09/30/21 0953  AST 14*  ALT 15  ALKPHOS 59  BILITOT 0.4  PROT 6.1*  ALBUMIN 3.0*   No results for input(s): "LIPASE", "AMYLASE" in the last 168 hours. No results for input(s): "AMMONIA" in the last 168 hours.  CBC: Recent Labs  Lab 09/30/21 0953  WBC 9.1  NEUTROABS 6.5  HGB 6.9*  HCT 22.4*  MCV 94.1  PLT 212    Cardiac Enzymes: No results for input(s): "CKTOTAL", "CKMB", "CKMBINDEX", "TROPONINI" in the last 168 hours.  BNP: Invalid input(s): "POCBNP"  CBG: Recent Labs  Lab  09/30/21 1342  GLUCAP 109*    Microbiology: Results for orders placed or performed in visit on 06/25/17  Microscopic Examination     Status: Abnormal   Collection Time: 06/25/17 10:15 AM   URINE  Result Value Ref Range Status   WBC, UA 11-30 (A) 0 - 5 /hpf Final   RBC, UA 3-10 (A) 0 - 2 /hpf Final   Epithelial Cells (non renal) 0-10 0 - 10 /hpf Final   Casts Present (A) None seen /lpf Final   Cast Type Hyaline casts N/A Final   Mucus, UA Present (A) Not Estab. Final   Bacteria, UA Moderate (A) None  seen/Few Final  CULTURE, URINE COMPREHENSIVE     Status: None   Collection Time: 06/25/17 12:16 PM   Specimen: Urine   BL  Result Value Ref Range Status   Urine Culture, Comprehensive Final report  Final   Organism ID, Bacteria Comment  Final    Comment: Mixed urogenital flora Greater than 100,000 colony forming units per mL     Coagulation Studies: No results for input(s): "LABPROT", "INR" in the last 72 hours.  Urinalysis: No results for input(s): "COLORURINE", "LABSPEC", "PHURINE", "GLUCOSEU", "HGBUR", "BILIRUBINUR", "KETONESUR", "PROTEINUR", "UROBILINOGEN", "NITRITE", "LEUKOCYTESUR" in the last 72 hours.  Invalid input(s): "APPERANCEUR"    Imaging: DG Chest Portable 1 View  Result Date: 09/30/2021 CLINICAL DATA:  Shortness of breath.  On dialysis. EXAM: PORTABLE CHEST 1 VIEW COMPARISON:  09/20/2021 FINDINGS: The heart size and mediastinal contours are within normal limits. Mild bilateral interstitial opacities most pronounced in the perihilar and bibasilar regions. No pleural effusion or pneumothorax. The visualized skeletal structures are unremarkable. IMPRESSION: Mild bilateral interstitial opacities most pronounced in the perihilar and bibasilar regions. Findings may reflect interstitial edema versus atypical/viral infection. Electronically Signed   By: Davina Poke D.O.   On: 09/30/2021 12:58     Medications:    dialysis solution 2.5% low-MG/low-CA      gentamicin cream  1 Application Topical Daily     Assessment/ Plan:  Nichole Wiley is a 40 y.o.  female with past medical history including GERD, hypertension, arthritis, diabetes, and end-stage renal disease on peritoneal dialysis.  Patient presents to the emergency department with complaints of muscle spasms.  Patient will be admitted for Symptomatic anemia [D64.9]   Hyperkalemia with end-stage renal disease on peritoneal dialysis.  Will maintain nightly treatments during this hospitalization.  Offered to  treat hyperkalemia with hemodialysis by placing temp cath however patient refused.  Will attempt to manage with frequent manual drains using PD catheter and regularly scheduled PD treatment tonight.  Will use 2.5% dialysate to also assist in management of edema.  Discussed with patient this treatment plan and will reassess in a.m. hepatitis B labs ordered per hospital protocol.  2. Anemia of chronic kidney disease Lab Results  Component Value Date   HGB 6.9 (L) 09/30/2021    Hemoglobin below desired target.  Primary team to order 1 unit blood transfusion..  3. Secondary Hyperparathyroidism:  Lab Results  Component Value Date   CALCIUM 8.4 (L) 09/30/2021   PHOS 9.6 (H) 09/30/2021    Calcium within acceptable range however phosphorus elevated.  We will continue to monitor and consider binders.  4. 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  Most recent hemoglobin A1c is 5 on 04/27/21.     LOS: 0 Okley Magnussen 7/24/20233:29 PM

## 2021-09-30 NOTE — ED Notes (Signed)
Pt constantly on cell phone texting and bending arm with transfusion.  Pt instructed to keep arm straight numerous times.    Pt continues to bend arm.

## 2021-09-30 NOTE — ED Notes (Signed)
Iv leaking.  Prbc's stopped and iv restarted.  Pt eating dinner meal  peritoneal dialysis ongoing.

## 2021-09-30 NOTE — Patient Instructions (Signed)
  Raina Mina, thank you for joining Mar Daring, PA-C for today's virtual visit.  While this provider is not your primary care provider (PCP), if your PCP is located in our provider database this encounter information will be shared with them immediately following your visit.  Consent: (Patient) Nichole Wiley provided verbal consent for this virtual visit at the beginning of the encounter.  Current Medications:  Current Outpatient Medications:    Accu-Chek Softclix Lancets lancets, Use to check blood sugar daily for type 2 diabetes. E11.9, Disp: 100 each, Rfl: 4   aspirin EC 81 MG tablet, Take 81 mg by mouth daily., Disp: , Rfl:    doxycycline (VIBRA-TABS) 100 MG tablet, Take 1 tablet (100 mg total) by mouth 2 (two) times daily for 7 days., Disp: 14 tablet, Rfl: 0   epoetin alfa (EPOGEN) 40000 UNIT/ML injection, Inject 0.5 mLs (20,000 Units total) into the skin once a week., Disp: 1 mL, Rfl: 0   gentamicin cream (GARAMYCIN) 0.1 %, Apply 1 application. topically daily., Disp: 15 g, Rfl: 0   glucose blood (ACCU-CHEK GUIDE) test strip, 1 each by Other route See admin instructions., Disp: 100 each, Rfl: 0   labetalol (NORMODYNE) 100 MG tablet, Take 1 tablet (100 mg total) by mouth 3 (three) times daily., Disp: 270 tablet, Rfl: 1   levothyroxine (SYNTHROID) 25 MCG tablet, Take 1 tablet (25 mcg total) by mouth daily., Disp: 90 tablet, Rfl: 0   methadone (DOLOPHINE) 10 MG/ML solution, Take 160 mg by mouth daily., Disp: , Rfl:    metoCLOPramide (REGLAN) 5 MG tablet, Take 0.5 tablets (2.5 mg total) by mouth 4 (four) times daily -  before meals and at bedtime., Disp: 120 tablet, Rfl: 0   naloxone (NARCAN) nasal spray 4 mg/0.1 mL, One spray in either nostril once for known/suspected opioid overdose. May repeat every 2-3 minutes in alternating nostril til EMS arrives, Disp: , Rfl:    omeprazole (PRILOSEC) 20 MG capsule, Take 1 capsule (20 mg total) by mouth daily., Disp: 90 capsule, Rfl: 0    Medications ordered in this encounter:  No orders of the defined types were placed in this encounter.    *If you need refills on other medications prior to your next appointment, please contact your pharmacy*  Follow-Up: Call back or seek an in-person evaluation if the symptoms worsen or if the condition fails to improve as anticipated.  Other Instructions Seek in person evaluation at local UC or Primary care office (if available today)   If you have been instructed to have an in-person evaluation today at a local Urgent Care facility, please use the link below. It will take you to a list of all of our available [facility], including address, phone number and hours of operation. Please do not delay care.  [facility]  If you or a family member do not have a primary care provider, use the link below to schedule a visit and establish care. When you choose a [facility] primary care physician or advanced practice provider, you gain a long-term partner in health. Find a Primary Care Provider  Learn more about 's in-office and virtual care options: [facility]

## 2021-09-30 NOTE — Assessment & Plan Note (Addendum)
Stable.  Continue PPI. 

## 2021-09-30 NOTE — ED Notes (Signed)
Resumed care from Jay rn.  Pt alert. On cell phone   waiting for admission.  .  Nsr on monitor.

## 2021-09-30 NOTE — Assessment & Plan Note (Signed)
Smoking cessation was discussed with patient in detail She declines a nicotine transdermal patch at this time

## 2021-09-30 NOTE — ED Notes (Signed)
Prbc's infusing

## 2021-09-30 NOTE — Assessment & Plan Note (Addendum)
Most likely related to patient's end-stage renal disease. We will transfuse 1 unit of packed RBC Continue ESA

## 2021-09-30 NOTE — Assessment & Plan Note (Signed)
Potassium down to 5.6 today.  Will prescribe Lokelma twice daily dosing.  Continue peritoneal dialysis

## 2021-09-30 NOTE — Progress Notes (Signed)
Virtual Visit Consent   Nichole Wiley, you are scheduled for a virtual visit with a [facility] provider today. Just as with appointments in the office, your consent must be obtained to participate. Your consent will be active for this visit and any virtual visit you may have with one of our providers in the next 365 days. If you have a MyChart account, a copy of this consent can be sent to you electronically.  As this is a virtual visit, video technology does not allow for your provider to perform a traditional examination. This may limit your provider's ability to fully assess your condition. If your provider identifies any concerns that need to be evaluated in person or the need to arrange testing (such as labs, EKG, etc.), we will make arrangements to do so. Although advances in technology are sophisticated, we cannot ensure that it will always work on either your end or our end. If the connection with a video visit is poor, the visit may have to be switched to a telephone visit. With either a video or telephone visit, we are not always able to ensure that we have a secure connection.  By engaging in this virtual visit, you consent to the provision of healthcare and authorize for your insurance to be billed (if applicable) for the services provided during this visit. Depending on your insurance coverage, you may receive a charge related to this service.  I need to obtain your verbal consent now. Are you willing to proceed with your visit today? Nichole Wiley has provided verbal consent on 09/30/2021 for a virtual visit (video or telephone). Mar Daring, PA-C  Date: 09/30/2021 8:11 AM  Virtual Visit via Video Note   I, Mar Daring, connected with  Nichole Wiley  ([id number], 06/05/1980) on 09/30/21 at  7:45 AM EDT by a video-enabled telemedicine application and verified that I am speaking with the correct person using two identifiers.  Location: Patient: Virtual Visit Location  Patient: Home Provider: Virtual Visit Location Provider: Home Office   I discussed the limitations of evaluation and management by telemedicine and the availability of in person appointments. The patient expressed understanding and agreed to proceed.    History of Present Illness: Nichole Wiley is a 41 y.o. who identifies as a female who was assigned female at birth, and is being seen today for muscle spasms. States they have been present for a week, but has worsened over last 2 days. She was seen by internal med on 09/20/21 and had mentioned having the spasms and cramping there. She states now she feels the spasm in her tongue and jaw making it difficult for her to speak, and having in her arms and hands severe enough that she is dropping things. PMH is positive for T1DM, ESRD on peritoneal dialysis, anemia in ESRD. Last labs on 09/20/21 showed potassium of 6.3, glucose 54, sodium 133, Creatinine 15.56 (stable) and eGFR 3 (stable), HgB decreased from 8.4 to 7.1, BNP borderline elevated at 1209.    Problems:  Patient Active Problem List   Diagnosis Date Noted   SOB (shortness of breath) 09/20/2021   Gastroparesis due to secondary diabetes ([place]) 08/10/2021   Symptomatic anemia 08/08/2021   Nicotine dependence 08/08/2021   Hypothyroidism 08/08/2021   Depression with anxiety 08/08/2021   HTN (hypertension) 06/27/2021   GERD (gastroesophageal reflux disease) 06/27/2021   Osteoarthritis 06/27/2021   ESRD (end stage renal disease) ([place]) 06/27/2021   Anemia of chronic renal failure, stage  5 ([place]) 06/27/2021   HLD (hyperlipidemia) 06/27/2021   Overweight with body mass index (BMI) of 28 to 28.9 in adult 06/27/2021   Type 1 diabetes mellitus without complication ([place]) 41/74/0814   Chronic constipation     Allergies:  Allergies  Allergen Reactions   Cephalexin Other (See Comments) and Rash    Unknown   Penicillins Rash    Has patient had a PCN reaction causing immediate rash,  facial/tongue/throat swelling, SOB or lightheadedness with hypotension: No Has patient had a PCN reaction causing severe rash involving mucus membranes or skin necrosis: No Has patient had a PCN reaction that required hospitalization: No Has patient had a PCN reaction occurring within the last 10 years: No If all of the above answers are "NO", then may proceed with Cephalosporin use.   Medications:  Current Outpatient Medications:    Accu-Chek Softclix Lancets lancets, Use to check blood sugar daily for type 2 diabetes. E11.9, Disp: 100 each, Rfl: 4   aspirin EC 81 MG tablet, Take 81 mg by mouth daily., Disp: , Rfl:    doxycycline (VIBRA-TABS) 100 MG tablet, Take 1 tablet (100 mg total) by mouth 2 (two) times daily for 7 days., Disp: 14 tablet, Rfl: 0   epoetin alfa (EPOGEN) 40000 UNIT/ML injection, Inject 0.5 mLs (20,000 Units total) into the skin once a week., Disp: 1 mL, Rfl: 0   gentamicin cream (GARAMYCIN) 0.1 %, Apply 1 application. topically daily., Disp: 15 g, Rfl: 0   glucose blood (ACCU-CHEK GUIDE) test strip, 1 each by Other route See admin instructions., Disp: 100 each, Rfl: 0   labetalol (NORMODYNE) 100 MG tablet, Take 1 tablet (100 mg total) by mouth 3 (three) times daily., Disp: 270 tablet, Rfl: 1   levothyroxine (SYNTHROID) 25 MCG tablet, Take 1 tablet (25 mcg total) by mouth daily., Disp: 90 tablet, Rfl: 0   methadone (DOLOPHINE) 10 MG/ML solution, Take 160 mg by mouth daily., Disp: , Rfl:    metoCLOPramide (REGLAN) 5 MG tablet, Take 0.5 tablets (2.5 mg total) by mouth 4 (four) times daily -  before meals and at bedtime., Disp: 120 tablet, Rfl: 0   naloxone (NARCAN) nasal spray 4 mg/0.1 mL, One spray in either nostril once for known/suspected opioid overdose. May repeat every 2-3 minutes in alternating nostril til EMS arrives, Disp: , Rfl:    omeprazole (PRILOSEC) 20 MG capsule, Take 1 capsule (20 mg total) by mouth daily., Disp: 90 capsule, Rfl:  0  Observations/Objective: Patient is well-developed, well-nourished in no acute distress.  Resting comfortably at home.  Head is normocephalic, atraumatic.  No labored breathing.  Speech is clear and coherent with logical content.  Patient is alert and oriented at baseline.    Assessment and Plan: 1. Muscle spasm  2. Hyperkalemia  3. Type 1 diabetes mellitus without complication (HCC)  4. ESRD (end stage renal disease) ([place])  - Multifactorial risks for muscle spasms - Advised to seek in person evaluation at PCP office or UC for recheck of lab work and further evaluation for muscle cramps/spasms  Follow Up Instructions: I discussed the assessment and treatment plan with the patient. The patient was provided an opportunity to ask questions and all were answered. The patient agreed with the plan and demonstrated an understanding of the instructions.  A copy of instructions were sent to the patient via MyChart unless otherwise noted below.    The patient was advised to call back or seek an in-person evaluation if the symptoms worsen or if  the condition fails to improve as anticipated.  Time:  I spent 12 minutes with the patient via telehealth technology discussing the above problems/concerns.    Mar Daring, PA-C

## 2021-10-01 ENCOUNTER — Encounter
Admission: EM | Disposition: A | Discharge status: Home / Self Care | Payer: Self-pay | Source: Home / Self Care | Attending: Internal Medicine

## 2021-10-01 DIAGNOSIS — E875 Hyperkalemia: Secondary | ICD-10-CM

## 2021-10-01 DIAGNOSIS — E1122 Type 2 diabetes mellitus with diabetic chronic kidney disease: Secondary | ICD-10-CM

## 2021-10-01 DIAGNOSIS — F1120 Opioid dependence, uncomplicated: Secondary | ICD-10-CM | POA: Diagnosis not present

## 2021-10-01 DIAGNOSIS — F17210 Nicotine dependence, cigarettes, uncomplicated: Secondary | ICD-10-CM

## 2021-10-01 DIAGNOSIS — N186 End stage renal disease: Secondary | ICD-10-CM

## 2021-10-01 DIAGNOSIS — G253 Myoclonus: Secondary | ICD-10-CM | POA: Diagnosis not present

## 2021-10-01 DIAGNOSIS — Z992 Dependence on renal dialysis: Secondary | ICD-10-CM

## 2021-10-01 DIAGNOSIS — K219 Gastro-esophageal reflux disease without esophagitis: Secondary | ICD-10-CM

## 2021-10-01 DIAGNOSIS — R259 Unspecified abnormal involuntary movements: Principal | ICD-10-CM

## 2021-10-01 DIAGNOSIS — E039 Hypothyroidism, unspecified: Secondary | ICD-10-CM

## 2021-10-01 DIAGNOSIS — D649 Anemia, unspecified: Secondary | ICD-10-CM | POA: Diagnosis not present

## 2021-10-01 HISTORY — PX: TEMPORARY DIALYSIS CATHETER: CATH118312

## 2021-10-01 LAB — BASIC METABOLIC PANEL
Anion gap: 11 (ref 5–15)
BUN: 91 mg/dL — ABNORMAL HIGH (ref 6–20)
CO2: 24 mmol/L (ref 22–32)
Calcium: 8.3 mg/dL — ABNORMAL LOW (ref 8.9–10.3)
Chloride: 103 mmol/L (ref 98–111)
Creatinine, Ser: 15.75 mg/dL — ABNORMAL HIGH (ref 0.44–1.00)
GFR, Estimated: 3 mL/min — ABNORMAL LOW (ref >60)
Glucose, Bld: 104 mg/dL — ABNORMAL HIGH (ref 70–99)
Potassium: 5.6 mmol/L — ABNORMAL HIGH (ref 3.5–5.1)
Sodium: 138 mmol/L (ref 135–145)

## 2021-10-01 LAB — BPAM RBC
Blood Product Expiration Date: 202308132359
ISSUE DATE / TIME: 202307241634
Unit Type and Rh: 6200

## 2021-10-01 LAB — CBC
HCT: 24.8 % — ABNORMAL LOW (ref 36.0–46.0)
Hemoglobin: 7.9 g/dL — ABNORMAL LOW (ref 12.0–15.0)
MCH: 28.4 pg (ref 26.0–34.0)
MCHC: 31.9 g/dL (ref 30.0–36.0)
MCV: 89.2 fL (ref 80.0–100.0)
Platelets: 184 10*3/uL (ref 150–400)
RBC: 2.78 MIL/uL — ABNORMAL LOW (ref 3.87–5.11)
RDW: 15.4 % (ref 11.5–15.5)
WBC: 8.6 10*3/uL (ref 4.0–10.5)
nRBC: 0 % (ref 0.0–0.2)

## 2021-10-01 LAB — HEPATITIS B E ANTIBODY: Hep B E Ab: NEGATIVE

## 2021-10-01 LAB — TYPE AND SCREEN
ABO/RH(D): A POS
Antibody Screen: NEGATIVE
Unit division: 0

## 2021-10-01 LAB — HEMOGLOBIN A1C
Hgb A1c MFr Bld: 4.8 % (ref 4.8–5.6)
Mean Plasma Glucose: 91.06 mg/dL

## 2021-10-01 LAB — HEPATITIS B SURFACE ANTIBODY, QUANTITATIVE: Hep B S AB Quant (Post): <3.1 m[IU]/mL — ABNORMAL LOW (ref >9.9)

## 2021-10-01 LAB — HEPATITIS B SURFACE ANTIGEN: Hepatitis B Surface Ag: NONREACTIVE

## 2021-10-01 LAB — HEPATITIS B CORE ANTIBODY, IGM: Hep B C IgM: NONREACTIVE

## 2021-10-01 SURGERY — TEMPORARY DIALYSIS CATHETER
Anesthesia: LOCAL

## 2021-10-01 MED ORDER — LABETALOL HCL 100 MG PO TABS
100.0000 mg | ORAL_TABLET | Freq: Once | ORAL | Status: AC
  Administered 2021-10-01: 100 mg via ORAL
  Filled 2021-10-01: qty 1

## 2021-10-01 MED ORDER — METHADONE HCL 10 MG/ML PO CONC
120.0000 mg | Freq: Every day | ORAL | Status: DC
  Administered 2021-10-02 – 2021-10-03 (×2): 120 mg via ORAL
  Filled 2021-10-01 (×3): qty 15

## 2021-10-01 MED ORDER — CHLORHEXIDINE GLUCONATE CLOTH 2 % EX PADS
6.0000 | MEDICATED_PAD | Freq: Every day | CUTANEOUS | Status: DC
  Administered 2021-10-02: 6 via TOPICAL

## 2021-10-01 MED ORDER — LIDOCAINE HCL (PF) 1 % IJ SOLN
INTRAMUSCULAR | Status: DC | PRN
  Administered 2021-10-01: 20 mL

## 2021-10-01 MED ORDER — SODIUM ZIRCONIUM CYCLOSILICATE 10 G PO PACK
10.0000 g | PACK | Freq: Two times a day (BID) | ORAL | Status: DC
  Administered 2021-10-01 – 2021-10-02 (×3): 10 g via ORAL
  Filled 2021-10-01 (×3): qty 1

## 2021-10-01 MED ORDER — MELATONIN 5 MG PO TABS
5.0000 mg | ORAL_TABLET | Freq: Once | ORAL | Status: AC
  Administered 2021-10-01: 5 mg via ORAL
  Filled 2021-10-01: qty 1

## 2021-10-01 MED ORDER — METHADONE HCL 10 MG/ML PO CONC: 120.0000 mg | Freq: Every day | ORAL | Status: DC

## 2021-10-01 MED ORDER — METHADONE HCL 10 MG/ML PO CONC: 40.0000 mg | Freq: Every day | ORAL | Status: DC

## 2021-10-01 MED ORDER — CLONAZEPAM 0.25 MG PO TBDP
0.2500 mg | ORAL_TABLET | Freq: Once | ORAL | Status: AC
  Administered 2021-10-01: 0.25 mg via ORAL
  Filled 2021-10-01: qty 1

## 2021-10-01 MED ORDER — SODIUM CHLORIDE 0.9 % IV SOLN: INTRAVENOUS | Status: DC

## 2021-10-01 MED ORDER — METHADONE HCL 10 MG/ML PO CONC: 80.0000 mg | Freq: Every day | ORAL | Status: DC

## 2021-10-01 SURGICAL SUPPLY — 3 items
COVER PROBE U/S 5X48 (MISCELLANEOUS) ×1 IMPLANT
KIT CATH DIALYSIS TRI 20X13 (CATHETERS) ×1 IMPLANT
KIT DIALYSIS CATH TRI 30X13 (CATHETERS) IMPLANT

## 2021-10-01 NOTE — Progress Notes (Signed)
Spoke with Otilio Miu, NP and AC in regards to patient PD overnight . No dialysis staff on call for tonight so treatment will be deferred until morning. Will update oncoming nurse.

## 2021-10-01 NOTE — Consult Note (Signed)
Neurology Consultation  Reason for Consult: Abnormal movements Referring Physician: Dr. Loletha Grayer  CC: Abnormal movements  History is obtained from: Patient, chart  HPI: Nichole Wiley is a 41 y.o. female past history of diabetes, incisional disease on PD noncompliant to PD, hypertension, presented with history of whole body abnormal jerky and spasmodic movements.  She reports that symptoms started a couple of days ago, with her legs and now have started to involve her whole body.  She describes the episodes as shocklike jerks involving the whole body that come on without any warning and are continuous even throughout the sleep. Reports mild dysphagia.  Continues to walk but has to take smaller steps because of the quick jerks that she is having, which make her feel off balance. She is also on methadone and is being tapered down-managed by a facility in [place]. Not on antipsychotics. No history of loss of consciousness or bowel bladder incontinence with these movements although does report that she has had involuntary bitten her tongue due to the uncontrollable and unpredictable nature of these jerks that now also involve her facial muscles.  ROS: Full ROS was performed and is negative except as noted in the HPI.   Past Medical History:  Diagnosis Date   Abdominal mass    Arthritis    back   Chronic kidney disease    Colon polyp    Diabetes 1.5, managed as type 1 ([place])    Drug addiction (HCC)    hx (pain pills after c-sect)   GERD (gastroesophageal reflux disease)    Hypertension    Pelvic floor dysfunction    Urine incontinence      Family History  Problem Relation Age of Onset   Depression Mother    Diabetes Father    Heart disease Maternal Grandmother    Depression Maternal Grandmother    Dementia Maternal Grandmother    Diabetes Maternal Grandmother    Diabetes Paternal Grandmother      Social History:   reports that she has been smoking cigarettes. She has a  12.00 pack-year smoking history. She has never used smokeless tobacco. She reports that she does not drink alcohol and does not use drugs.  Medications  Current Facility-Administered Medications:    acetaminophen (TYLENOL) tablet 650 mg, 650 mg, Oral, Q6H PRN **OR** acetaminophen (TYLENOL) suppository 650 mg, 650 mg, Rectal, Q6H PRN, Agbata, Tochukwu, MD   Darbepoetin Alfa (ARANESP) injection 25 mcg, 25 mcg, Subcutaneous, Q7 days, Agbata, Tochukwu, MD   dialysis solution 2.5% low-MG/low-CA dianeal solution, , Intraperitoneal, Q4H, Singh, Harmeet, MD   gentamicin cream (GARAMYCIN) 0.1 % 1 Application, 1 Application, Topical, Daily, Singh, Harmeet, MD   labetalol (NORMODYNE) tablet 100 mg, 100 mg, Oral, TID, Agbata, Tochukwu, MD, 100 mg at 10/01/21 1109   levothyroxine (SYNTHROID) tablet 25 mcg, 25 mcg, Oral, Daily, Agbata, Tochukwu, MD, 25 mcg at 10/01/21 0534   [START ON 10/02/2021] methadone (DOLOPHINE) 10 MG/ML solution 120 mg, 120 mg, Oral, Daily, Beers, Brandon D, RPH   ondansetron (ZOFRAN) tablet 4 mg, 4 mg, Oral, Q6H PRN **OR** ondansetron (ZOFRAN) injection 4 mg, 4 mg, Intravenous, Q6H PRN, Agbata, Tochukwu, MD   pantoprazole (PROTONIX) EC tablet 40 mg, 40 mg, Oral, Daily, Agbata, Tochukwu, MD, 40 mg at 10/01/21 1109   sodium chloride flush (NS) 0.9 % injection 3 mL, 3 mL, Intravenous, Q12H, Agbata, Tochukwu, MD, 3 mL at 09/30/21 2218   sodium zirconium cyclosilicate (LOKELMA) packet 10 g, 10 g, Oral, BID, Loletha Grayer, MD,  10 g at 10/01/21 7510   Exam: Current vital signs: BP (!) 151/99 (BP Location: Right Arm)   Pulse 73   Temp 98.6 F (37 C)   Resp 18   Ht '5\' 7"'$  (1.702 m)   Wt 88.8 kg   SpO2 100%   BMI 30.66 kg/m  Vital signs in last 24 hours: Temp:  [98 F (36.7 C)-98.6 F (37 C)] 98.6 F (37 C) (07/25 1358) Pulse Rate:  [73-88] 73 (07/25 1358) Resp:  [16-19] 18 (07/25 1358) BP: (151-194)/(82-104) 151/99 (07/25 1358) SpO2:  [98 %-100 %] 100 % (07/25 1358) Weight:   [87.6 kg-88.8 kg] 88.8 kg (07/25 0438) General: Awake alert and somewhat of distress due to these abnormal movements that are described below HEENT: Normocephalic atraumatic CVs: Regular rhythm Respiratory: Breathing well saturating normally on room air Abdomen nondistended nontender Neurological exam Awake alert oriented x3 No dysarthria She has very frequent poly myoclonus involving face arm and leg muscles, happening at different time intervals at different frequencies with rapid jerking movements very suggestive of myoclonus. Cranial nerves II to XII intact Motor examination with no drift but myoclonus as well as asterixis on outstretched hands. Sensory exam: Intact to light touch Coordination: Difficult to assess due to above.  Labs I have reviewed labs in epic and the results pertinent to this consultation are: CBC    Component Value Date/Time   WBC 8.6 10/01/2021 0255   RBC 2.78 (L) 10/01/2021 0255   HGB 7.9 (L) 10/01/2021 0255   HGB 12.8 01/20/2012 1610   HCT 24.8 (L) 10/01/2021 0255   HCT 36.8 01/20/2012 1610   PLT 184 10/01/2021 0255   PLT 95 (L) 01/20/2012 1610   MCV 89.2 10/01/2021 0255   MCV 89 01/20/2012 1610   MCH 28.4 10/01/2021 0255   MCHC 31.9 10/01/2021 0255   RDW 15.4 10/01/2021 0255   RDW 12.6 01/20/2012 1610   LYMPHSABS 1.5 09/30/2021 0953   MONOABS 0.4 09/30/2021 0953   EOSABS 0.6 (H) 09/30/2021 0953   BASOSABS 0.1 09/30/2021 0953    CMP     Component Value Date/Time   NA 138 10/01/2021 0255   NA 127 (L) 01/20/2012 1610   K 5.6 (H) 10/01/2021 0255   K 3.5 01/20/2012 1610   CL 103 10/01/2021 0255   CL 91 (L) 01/20/2012 1610   CO2 24 10/01/2021 0255   CO2 26 01/20/2012 1610   GLUCOSE 104 (H) 10/01/2021 0255   GLUCOSE 560 (HH) 01/20/2012 1610   BUN 91 (H) 10/01/2021 0255   BUN 10 01/20/2012 1610   CREATININE 15.75 (H) 10/01/2021 0255   CREATININE 15.56 (H) 09/20/2021 1445   CALCIUM 8.3 (L) 10/01/2021 0255   CALCIUM 8.7 01/20/2012 1610    PROT 6.1 (L) 09/30/2021 0953   PROT 7.5 01/20/2012 1610   ALBUMIN 3.0 (L) 09/30/2021 0953   ALBUMIN 2.9 (L) 01/20/2012 1610   AST 14 (L) 09/30/2021 0953   AST 23 01/20/2012 1610   ALT 15 09/30/2021 0953   ALT 43 01/20/2012 1610   ALKPHOS 59 09/30/2021 0953   ALKPHOS 235 (H) 01/20/2012 1610   BILITOT 0.4 09/30/2021 0953   BILITOT 0.9 01/20/2012 1610   GFRNONAA 3 (L) 10/01/2021 0255   GFRNONAA 71 09/28/2015 1524   GFRAA 35 (L) 11/23/2018 1719   GFRAA 82 09/28/2015 1524    Lipid Panel     Component Value Date/Time   CHOL 258 (H) 11/23/2018 1719   TRIG 113 11/23/2018 1719   HDL 55  11/23/2018 1719   CHOLHDL 4.7 11/23/2018 1719   VLDL 23 11/23/2018 1719   LDLCALC 180 (H) 11/23/2018 1719   Severely elevated BUN and creatinine.  Imaging I have reviewed the images obtained:  No brain imaging  Assessment:  41 year old with diabetes and ESRD along with other comorbidities, noncompliant to PD for her ESRD, presenting with abnormal myoclonic appearing movements of her whole body. 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 That said, given her history of diabetes, and MRI of the brain, whenever her movements become better or subside would be important to rule out basal ganglia strokes or osmotic changes seen with diabetes.  Recommendations: -Correction of uremia per primary team and nephrology -If symptomatic treatment is needed, can try Klonopin p.o. or even Keppra and Depakote that help with myoclonus but given her duration of function, I would avoid Keppra for now. -I do not see a need to do an EEG at this time. -MRI of the brain when she is able to tolerate Will follow Plan relayed to Dr. Leslye Peer  -- Amie Portland, MD Neurologist [facility] Pager: [phone]

## 2021-10-01 NOTE — Hospital Course (Signed)
41 year old female with past medical history of diabetes and end-stage renal disease on peritoneal dialysis but has been noncompliant with peritoneal dialysis, hypertension and GERD presents to the hospital with spasms and cramps and involuntary movements.  It started in her legs a couple weeks ago and then went to her arms and now her head.  Patient is talking to me during these episodes.  Has had some low sugars at home.  Was found to have hyperkalemia on presentation.  Patient was also given a unit of blood and hemoglobin of 6.9.

## 2021-10-01 NOTE — TOC Initial Note (Signed)
Transition of Care [facility]) - Initial/Assessment Note    Patient Details  Name: Nichole Wiley MRN: [id number] Date of Birth: [birth year]  Transition of Care [facility]) CM/SW Contact:    Laurena Slimmer, RN Phone Number: 10/01/2021, 10:43 AM  Clinical Narrative:                  Transition of Care [facility]) Screening Note   Patient Details  Name: Nichole Wiley Date of Birth: 12/24/1980   Transition of Care [facility]) CM/SW Contact:    Laurena Slimmer, RN Phone Number: 10/01/2021, 10:43 AM    Transition of Care Department [facility]) has reviewed patient and no TOC needs have been identified at this time. We will continue to monitor patient advancement through interdisciplinary progression rounds. If new patient transition needs arise, please place a TOC consult.          Patient Goals and CMS Choice        Expected Discharge Plan and Services                                                Prior Living Arrangements/Services                       Activities of Daily Living Home Assistive Devices/Equipment: None ADL Screening (condition at time of admission) Patient's cognitive ability adequate to safely complete daily activities?: Yes Is the patient deaf or have difficulty hearing?: No Does the patient have difficulty seeing, even when wearing glasses/contacts?: No Does the patient have difficulty concentrating, remembering, or making decisions?: No Patient able to express need for assistance with ADLs?: No Does the patient have difficulty dressing or bathing?: No Independently performs ADLs?: Yes (appropriate for developmental age) Does the patient have difficulty walking or climbing stairs?: No Weakness of Legs: None Weakness of Arms/Hands: None  Permission Sought/Granted                  Emotional Assessment              Admission diagnosis:  Symptomatic anemia [D64.9] Patient Active Problem List   Diagnosis Date Noted   Hyperkalemia  09/30/2021   SOB (shortness of breath) 09/20/2021   Diabetes mellitus ([place]) 08/10/2021   Symptomatic anemia 08/08/2021   Nicotine dependence 08/08/2021   Hypothyroidism 08/08/2021   Depression with anxiety 08/08/2021   HTN (hypertension) 06/27/2021   GERD (gastroesophageal reflux disease) 06/27/2021   Osteoarthritis 06/27/2021   ESRD (end stage renal disease) ([place]) 06/27/2021   Anemia of chronic renal failure, stage 5 ([place]) 06/27/2021   HLD (hyperlipidemia) 06/27/2021   Overweight with body mass index (BMI) of 28 to 28.9 in adult 06/27/2021   Type 1 diabetes mellitus without complication ([place]) 46/27/0350   Chronic constipation    PCP:  Jearld Fenton, NP Pharmacy:   [facility] [phone] [address], [place] AT [facility] [address] [place] [address] Phone: [phone] Fax: [phone]     Social Determinants of Health (SDOH) Interventions    Readmission Risk Interventions     No data to display

## 2021-10-01 NOTE — Op Note (Signed)
  OPERATIVE NOTE   PROCEDURE: Ultrasound guidance for vascular access right fmeoral vein Placement of a 20 cm triple lumen dialysis catheter right femoral vein  PRE-OPERATIVE DIAGNOSIS: 1. ESRD, hyperkalemia  POST-OPERATIVE DIAGNOSIS: Same  SURGEON: Leotis Pain, MD  ASSISTANT(S): None  ANESTHESIA: local  ESTIMATED BLOOD LOSS: Minimal   FINDING(S): 1.  None  SPECIMEN(S):  None  INDICATIONS:    Patient is a 41 y.o.female who presents with longstanding end-stage renal disease.  Dialysis with hyperkalemia that has been refractory to peritoneal dialysis and then primary service and nephrologist have recommended hemodialysis and need a temporary dialysis catheter.  Risks and benefits were discussed, and informed consent was obtained..  DESCRIPTION: After obtaining full informed written consent, the patient was laid flat in the bed.  The right groin was sterilely prepped and draped in a sterile surgical field was created. The right femoral vein was visualized with ultrasound and found to be widely patent. It was then accessed under direct guidance without difficulty with a Seldinger needle and a permanent image was recorded. A J-wire was then placed. After skin nick and dilatation, a 20 cm triple-lumen dialysis catheter was placed over the wire and the wire was removed. The lumens withdrew dark red nonpulsatile blood and flushed easily with sterile saline. The catheter was secured to the skin with 3 nylon sutures. Sterile dressing was placed.  COMPLICATIONS: None  CONDITION: Stable  Leotis Pain 10/01/2021 4:09 PM  This note was created with Dragon Medical transcription system. Any errors in dictation are purely unintentional.

## 2021-10-01 NOTE — Assessment & Plan Note (Signed)
Add on hemoglobin A1c.  Continue to monitor fingersticks.  Looking back at old hemoglobin A1c was low at 5.

## 2021-10-01 NOTE — Progress Notes (Signed)
Progress Note   Patient: Nichole Wiley PFY:[id number] DOB: 12-Nov-1980 DOA: 09/30/2021     1 DOS: the patient was seen and examined on 10/01/2021   Brief hospital course: 41 year old female with past medical history of diabetes and end-stage renal disease on peritoneal dialysis but has been noncompliant with peritoneal dialysis, hypertension and GERD presents to the hospital with spasms and cramps and involuntary movements.  It started in her legs a couple weeks ago and then went to her arms and now her head.  Patient is talking to me during these episodes.  Has had some low sugars at home.  Was found to have hyperkalemia on presentation.  Patient was also given a unit of blood and hemoglobin of 6.9.  Assessment and Plan: * Abnormal involuntary movements Patient with abnormal involuntary movements of her upper extremities.  I suspect uremia with a very high creatinine and noncompliance with peritoneal dialysis is playing a role.  Could also have a component of high-dose methadone and uremia contributing.  Will start to taper methadone dose.  Symptomatic anemia Anemia of chronic disease.  Transfuse 1 unit of packed red blood cells yesterday and hemoglobin came up to 7.9.  Hyperkalemia Potassium down to 5.6 today.  Will prescribe Lokelma twice daily dosing.  Continue peritoneal dialysis  ESRD (end stage renal disease) ([place]) Patient refused hemodialysis which would probably make her symptoms go away quicker.  Continue peritoneal dialysis.  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  With her creatinine being very elevated and methadone likely is sticking around longer.  Start to taper methadone. Case discussed with pharmacist and will decrease down to 120 mg daily starting tomorrow.  Hypothyroidism Continue Synthroid  GERD (gastroesophageal reflux disease) Patient on PPI  Type 2 diabetes mellitus with ESRD (end-stage renal disease) ([place]) Add on hemoglobin A1c.   Continue to monitor fingersticks.  Looking back at old hemoglobin A1c was low at 5.  Nicotine dependence Patient states that she is down to 3 cigarettes/day        Subjective: Patient states that she has had these jerking episodes which for started in her legs about a month ago and then progressed to her arms and head.  She takes 160 mg of methadone and is interested in coming down on the dose.  She came in with hyperkalemia and these involuntary movements.  Patient has been noncompliant with her peritoneal dialysis.  She does not want hemodialysis.  Physical Exam: Vitals:   09/30/21 2330 10/01/21 0244 10/01/21 0438 10/01/21 0832  BP: (!) 185/91 (!) 165/88 (!) 156/96 (!) 165/88  Pulse:  77 79 73  Resp:  '18 18 16  '$ Temp:   98 F (36.7 C) 98.4 F (36.9 C)  TempSrc:  Oral    SpO2:  98% 100% 100%  Weight:   88.8 kg   Height:       Physical Exam HENT:     Head: Normocephalic.     Mouth/Throat:     Pharynx: No oropharyngeal exudate.  Eyes:     General: Lids are normal.     Conjunctiva/sclera: Conjunctivae normal.  Cardiovascular:     Rate and Rhythm: Normal rate and regular rhythm.     Heart sounds: Normal heart sounds, S1 normal and S2 normal.  Pulmonary:     Breath sounds: No decreased breath sounds, wheezing, rhonchi or rales.  Abdominal:     Palpations: Abdomen is soft.     Tenderness: There is no abdominal tenderness.  Musculoskeletal:     Right lower leg: Swelling present.     Left lower leg: Swelling present.  Skin:    General: Skin is warm.     Findings: No rash.  Neurological:     Mental Status: She is alert and oriented to person, place, and time.     Comments: Patient with involuntary movements more with her arms and head than her legs.     Data Reviewed: Potassium 5.6, creatinine 15.75, BUN 91, hemoglobin 7.9.  Last ferritin 311   Disposition: Status is: Inpatient Remains inpatient appropriate because: Patient still with hyperkalemia and involuntary  twitching.  Planned Discharge Destination: Home    Time spent: 30 minutes Case discussed with pharmacist, nephrology and neurology  Author: Loletha Grayer, MD 10/01/2021 1:02 PM  For on call review [email or web address].
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
## 2021-10-01 NOTE — Assessment & Plan Note (Signed)
Patient with abnormal involuntary movements of her upper extremities.  I suspect uremia with a very high creatinine and noncompliance with peritoneal dialysis is playing a role.  Could also have a component of high-dose methadone and uremia contributing.  Will start to taper methadone dose.

## 2021-10-01 NOTE — Assessment & Plan Note (Signed)
Patient takes 160 mg of methadone per day.  With her creatinine being very elevated and methadone likely is sticking around longer.  Start to taper methadone. Case discussed with pharmacist and will decrease down to 120 mg daily starting tomorrow.

## 2021-10-01 NOTE — Progress Notes (Signed)
Pt has 3 cycles manual drain w/ 4 hour dwell each today. 2.5% PD fluid. First drain started. Pt alert, stable, no c/o. Dressing change complete. Clean, dry, intact. Pt education about PD and infection prevention complete. Primary RN gave report.

## 2021-10-02 ENCOUNTER — Encounter: Payer: Self-pay | Admitting: Vascular Surgery

## 2021-10-02 DIAGNOSIS — R259 Unspecified abnormal involuntary movements: Secondary | ICD-10-CM

## 2021-10-02 LAB — RENAL FUNCTION PANEL
Albumin: 2.5 g/dL — ABNORMAL LOW (ref 3.5–5.0)
Anion gap: 9 (ref 5–15)
BUN: 58 mg/dL — ABNORMAL HIGH (ref 6–20)
CO2: 25 mmol/L (ref 22–32)
Calcium: 7.7 mg/dL — ABNORMAL LOW (ref 8.9–10.3)
Chloride: 102 mmol/L (ref 98–111)
Creatinine, Ser: 12.44 mg/dL — ABNORMAL HIGH (ref 0.44–1.00)
GFR, Estimated: 3 mL/min — ABNORMAL LOW (ref >60)
Glucose, Bld: 152 mg/dL — ABNORMAL HIGH (ref 70–99)
Phosphorus: 7 mg/dL — ABNORMAL HIGH (ref 2.5–4.6)
Potassium: 4.7 mmol/L (ref 3.5–5.1)
Sodium: 136 mmol/L (ref 135–145)

## 2021-10-02 LAB — CBC
HCT: 23.3 % — ABNORMAL LOW (ref 36.0–46.0)
Hemoglobin: 7.3 g/dL — ABNORMAL LOW (ref 12.0–15.0)
MCH: 28.4 pg (ref 26.0–34.0)
MCHC: 31.3 g/dL (ref 30.0–36.0)
MCV: 90.7 fL (ref 80.0–100.0)
Platelets: 161 10*3/uL (ref 150–400)
RBC: 2.57 MIL/uL — ABNORMAL LOW (ref 3.87–5.11)
RDW: 15.5 % (ref 11.5–15.5)
WBC: 8.2 10*3/uL (ref 4.0–10.5)
nRBC: 0 % (ref 0.0–0.2)

## 2021-10-02 LAB — BASIC METABOLIC PANEL
Anion gap: 11 (ref 5–15)
BUN: 85 mg/dL — ABNORMAL HIGH (ref 6–20)
CO2: 22 mmol/L (ref 22–32)
Calcium: 8 mg/dL — ABNORMAL LOW (ref 8.9–10.3)
Chloride: 103 mmol/L (ref 98–111)
Creatinine, Ser: 15.63 mg/dL — ABNORMAL HIGH (ref 0.44–1.00)
GFR, Estimated: 3 mL/min — ABNORMAL LOW (ref >60)
Glucose, Bld: 85 mg/dL (ref 70–99)
Potassium: 5.5 mmol/L — ABNORMAL HIGH (ref 3.5–5.1)
Sodium: 136 mmol/L (ref 135–145)

## 2021-10-02 LAB — HEPATITIS B SURFACE ANTIBODY, QUANTITATIVE: Hep B S AB Quant (Post): <3.1 m[IU]/mL — ABNORMAL LOW (ref >9.9)

## 2021-10-02 MED ORDER — AMLODIPINE BESYLATE 10 MG PO TABS
10.0000 mg | ORAL_TABLET | Freq: Every day | ORAL | Status: DC
  Administered 2021-10-02 – 2021-10-03 (×2): 10 mg via ORAL
  Filled 2021-10-02 (×2): qty 1

## 2021-10-02 MED ORDER — LIDOCAINE-PRILOCAINE 2.5-2.5 % EX CREA: 1.0000 | TOPICAL_CREAM | CUTANEOUS | Status: DC | PRN

## 2021-10-02 MED ORDER — HEPARIN SODIUM (PORCINE) 1000 UNIT/ML IJ SOLN
INTRAMUSCULAR | Status: AC
  Filled 2021-10-02: qty 10

## 2021-10-02 MED ORDER — HEPARIN SODIUM (PORCINE) 1000 UNIT/ML DIALYSIS: 1000.0000 [IU] | INTRAMUSCULAR | Status: DC | PRN

## 2021-10-02 MED ORDER — PENTAFLUOROPROP-TETRAFLUOROETH EX AERO: 1.0000 | INHALATION_SPRAY | CUTANEOUS | Status: DC | PRN

## 2021-10-02 MED ORDER — ALTEPLASE 2 MG IJ SOLR: 2.0000 mg | Freq: Once | INTRAMUSCULAR | Status: DC | PRN

## 2021-10-02 MED ORDER — ANTICOAGULANT SODIUM CITRATE 4% (200MG/5ML) IV SOLN
5.0000 mL | Status: DC | PRN
Start: 2021-10-02 — End: 2021-10-02

## 2021-10-02 MED ORDER — DELFLEX-LC/2.5% DEXTROSE 394 MOSM/L IP SOLN: INTRAPERITONEAL | Status: AC

## 2021-10-02 NOTE — Progress Notes (Signed)
[facility] [place] [facility] [place] NAME: Nichole Wiley    MR#:  [id number]  DATE OF BIRTH:  19-Nov-1980  SUBJECTIVE:   patient reports her involuntary movements are improving. She received hemodialysis today. She is supposed to get peritoneal dialysis as well.   VITALS:  Blood pressure (!) 151/74, pulse 74, temperature 97.9 F (36.6 C), resp. rate 18, height '5\' 7"'$  (1.702 m), weight 88.3 kg, SpO2 99 %.  PHYSICAL EXAMINATION:   GENERAL:  41 y.o.-year-old patient lying in the bed with no acute distress.  LUNGS: Normal breath sounds bilaterally, no wheezing, rales, rhonchi.  CARDIOVASCULAR: S1, S2 normal. No murmurs, rubs, or gallops.  ABDOMEN: Soft, nontender, nondistended. Bowel sounds present.  EXTREMITIES: No  edema b/l.    NEUROLOGIC: nonfocal  patient is alert and awake. Involuntary jerky movement legs   LABORATORY PANEL:  CBC Recent Labs  Lab 10/02/21 0439  WBC 8.2  HGB 7.3*  HCT 23.3*  PLT 161    Chemistries  Recent Labs  Lab 09/30/21 0953 09/30/21 2220 10/02/21 0439  NA 136   < > 136  K 6.6*   < > 5.5*  CL 102   < > 103  CO2 21*   < > 22  GLUCOSE 63*   < > 85  BUN 86*   < > 85*  CREATININE 16.36*   < > 15.63*  CALCIUM 8.4*   < > 8.0*  MG 2.3  --   --   AST 14*  --   --   ALT 15  --   --   ALKPHOS 59  --   --   BILITOT 0.4  --   --    < > = values in this interval not displayed.    Assessment and Plan 41 year old female with past medical history of diabetes and end-stage renal disease on peritoneal dialysis but has been noncompliant with peritoneal dialysis, hypertension and GERD presents to the hospital with spasms and cramps and involuntary movements.  It started in her legs a couple weeks ago and then went to her arms and now her head. Was found to have hyperkalemia on presentation.  Patient was also given a unit of blood and hemoglobin of 6.9.  Abnormal involuntary movements/Myoclonus suspected due to Uremia --Patient  with abnormal involuntary movements of her upper extremities. -- suspect uremia with a very high creatinine and noncompliance with peritoneal dialysis is playing a role.   --Could also have a component of high-dose methadone and uremia contributing.  -- Will start to taper methadone dose--pt follows with pain clinic in [facility] -- neurology consultation appreciated. -- Patient feels her involuntary movements are getting better   Symptomatic anemia due to ESRD Anemia of chronic disease.  Transfuse 1 unit of packed red blood cells yesterday and hemoglobin came up to 7.9.   Hyperkalemia Potassium down to 5.5 today.  On Lokelma twice daily dosing.  Continue peritoneal dialysis   ESRD (end stage renal disease) ([place]) due to diabetic nephropathy (renal biopsy in the past) -- patient did get hemodialysis today. Patient got right femoral HD access by Dr. Lucky Cowboy 10/01/21 --  Continue peritoneal dialysis as well per [name].   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  With her creatinine being very elevated and methadone likely is sticking around longer.  -- Start to taper methadone. Case discussed with pharmacist and will decrease down to 120 mg daily. -- Patient tells me  she is on the taper program at the pain clinic in [place].   Hypothyroidism Continue Synthroid   GERD (gastroesophageal reflux disease) Patient on PPI   Type 2 diabetes mellitus with ESRD (end-stage renal disease) ([place]) -- sliding scale insulin -- sugars are within normal limits   Nicotine dependence Patient states that she is down to 3 cigarettes/day       Procedures: hemodialysis peritoneal dialysis Family communication : family at bedside Consults : nephrology, neurology CODE STATUS: full DVT Prophylaxis : heparin Level of care: Progressive Status is: Inpatient Remains inpatient appropriate because: patient needs to get hemodialysis and peritoneal dialysis to get her uremia and jerky  movements under control    TOTAL TIME TAKING CARE OF THIS PATIENT: 35 minutes.  >50% time spent on counselling and coordination of care  Note: This dictation was prepared with Dragon dictation along with smaller phrase technology. Any transcriptional errors that result from this process are unintentional.  Fritzi Mandes M.D    [facility]   CC: Primary care physician; Jearld Fenton, NP

## 2021-10-02 NOTE — Progress Notes (Signed)
TX initiated, no complications. Pt pre weight was 88.6kg and no uf goal to remove.

## 2021-10-02 NOTE — Progress Notes (Signed)
Neurology Progress Note   S:// Seen and examined Status post dialysis Twitching much better   O:// Current vital signs: BP (!) 179/86 (BP Location: Right Arm)   Pulse 85   Temp 98.1 F (36.7 C) (Oral)   Resp 18   Ht '5\' 7"'$  (1.702 m)   Wt 88.3 kg   SpO2 100%   BMI 30.49 kg/m  Vital signs in last 24 hours: Temp:  [97.5 F (36.4 C)-99 F (37.2 C)] 98.1 F (36.7 C) (07/26 1032) Pulse Rate:  [72-85] 85 (07/26 1032) Resp:  [9-20] 18 (07/26 1032) BP: (150-179)/(74-102) 179/86 (07/26 1032) SpO2:  [97 %-100 %] 100 % (07/26 1032) Weight:  [88.3 kg-88.6 kg] 88.3 kg (07/26 0953) General: Awake alert in no distress HEENT: Normocephalic atraumatic Lungs: Clear Cardiovascular: Regular rate rhythm Abdomen nondistended nontender Neurological exam Awake alert oriented x2 No dysarthria No aphasia Cranial nerves II to XII intact Motor examination with no drift.  The random twitching which was very frequent yesterday is very infrequent today.  She has a very subtle generalized tremor.  On outstretched arms, there is mild asterixis. Sensation intact light touch without extinction Coordination exam with no gross dysmetria-tremors as above.   Medications  Current Facility-Administered Medications:    acetaminophen (TYLENOL) tablet 650 mg, 650 mg, Oral, Q6H PRN **OR** acetaminophen (TYLENOL) suppository 650 mg, 650 mg, Rectal, Q6H PRN, Agbata, Tochukwu, MD   amLODipine (NORVASC) tablet 10 mg, 10 mg, Oral, Daily, Fritzi Mandes, MD   Chlorhexidine Gluconate Cloth 2 % PADS 6 each, 6 each, Topical, Q0600, Colon Flattery, NP, 6 each at 10/02/21 0549   Darbepoetin Alfa (ARANESP) injection 25 mcg, 25 mcg, Subcutaneous, Q7 days, Agbata, Tochukwu, MD   dialysis solution 2.5% low-MG/low-CA dianeal solution, , Intraperitoneal, Q4H, Murlean Iba, MD, New Bag at 10/01/21 1200   gentamicin cream (GARAMYCIN) 0.1 % 1 Application, 1 Application, Topical, Daily, [name], Harmeet, MD   labetalol  (NORMODYNE) tablet 100 mg, 100 mg, Oral, TID, Agbata, Tochukwu, MD, 100 mg at 10/02/21 1036   levothyroxine (SYNTHROID) tablet 25 mcg, 25 mcg, Oral, Daily, Agbata, Tochukwu, MD, 25 mcg at 10/02/21 0548   methadone (DOLOPHINE) 10 MG/ML solution 120 mg, 120 mg, Oral, Daily, Beers, Brandon D, RPH, 120 mg at 10/02/21 1031   ondansetron (ZOFRAN) tablet 4 mg, 4 mg, Oral, Q6H PRN **OR** ondansetron (ZOFRAN) injection 4 mg, 4 mg, Intravenous, Q6H PRN, Agbata, Tochukwu, MD   pantoprazole (PROTONIX) EC tablet 40 mg, 40 mg, Oral, Daily, Agbata, Tochukwu, MD, 40 mg at 10/02/21 1031   sodium chloride flush (NS) 0.9 % injection 3 mL, 3 mL, Intravenous, Q12H, Agbata, Tochukwu, MD, 3 mL at 10/02/21 1033   sodium zirconium cyclosilicate (LOKELMA) packet 10 g, 10 g, Oral, BID, Leslye Peer, Richard, MD, 10 g at 10/02/21 1032 Labs CBC    Component Value Date/Time   WBC 8.2 10/02/2021 0439   RBC 2.57 (L) 10/02/2021 0439   HGB 7.3 (L) 10/02/2021 0439   HGB 12.8 01/20/2012 1610   HCT 23.3 (L) 10/02/2021 0439   HCT 36.8 01/20/2012 1610   PLT 161 10/02/2021 0439   PLT 95 (L) 01/20/2012 1610   MCV 90.7 10/02/2021 0439   MCV 89 01/20/2012 1610   MCH 28.4 10/02/2021 0439   MCHC 31.3 10/02/2021 0439   RDW 15.5 10/02/2021 0439   RDW 12.6 01/20/2012 1610   LYMPHSABS 1.5 09/30/2021 0953   MONOABS 0.4 09/30/2021 0953   EOSABS 0.6 (H) 09/30/2021 0953   BASOSABS 0.1 09/30/2021 1829  CMP     Component Value Date/Time   NA 136 10/02/2021 0439   NA 127 (L) 01/20/2012 1610   K 5.5 (H) 10/02/2021 0439   K 3.5 01/20/2012 1610   CL 103 10/02/2021 0439   CL 91 (L) 01/20/2012 1610   CO2 22 10/02/2021 0439   CO2 26 01/20/2012 1610   GLUCOSE 85 10/02/2021 0439   GLUCOSE 560 (HH) 01/20/2012 1610   BUN 85 (H) 10/02/2021 0439   BUN 10 01/20/2012 1610   CREATININE 15.63 (H) 10/02/2021 0439   CREATININE 15.56 (H) 09/20/2021 1445   CALCIUM 8.0 (L) 10/02/2021 0439   CALCIUM 8.7 01/20/2012 1610   PROT 6.1 (L) 09/30/2021  0953   PROT 7.5 01/20/2012 1610   ALBUMIN 3.0 (L) 09/30/2021 0953   ALBUMIN 2.9 (L) 01/20/2012 1610   AST 14 (L) 09/30/2021 0953   AST 23 01/20/2012 1610   ALT 15 09/30/2021 0953   ALT 43 01/20/2012 1610   ALKPHOS 59 09/30/2021 0953   ALKPHOS 235 (H) 01/20/2012 1610   BILITOT 0.4 09/30/2021 0953   BILITOT 0.9 01/20/2012 1610   GFRNONAA 3 (L) 10/02/2021 0439   GFRNONAA 71 09/28/2015 1524   GFRAA 35 (L) 11/23/2018 1719   GFRAA 82 09/28/2015 1524     Imaging I have reviewed images in epic and the results pertinent to this consultation are: No neuroimaging  Assessment: 41 year old with diabetes and ESRD along with other comorbidities, noncompliant to peritoneal dialysis presenting with abnormal myoclonic jerking appearing movements of her whole body which have improved for dialysis. I think that these movements were directly related to the impaired renal function and uremia. I had recommended/taught of imaging her brain to rule out stroke or changes associated with osmotic shifts given her diabetes as well but her significantly improvement after dialysis points more in the direction of uremic myoclonus rather than structural brain lesion  Impression: Uremic polymyoclonus  Recommendations: At this point, mainstay for treatment is management of her kidney disease as primary team and nephrology are already doing. From a neurological standpoint, other than getting a brain MRI to rule out a structural cause, the yield of which is going to be low, can be considered if her symptoms do not further improve with further dialysis. I have relayed my plan to Dr. Posey Pronto   -- Amie Portland, MD Neurologist [facility] Pager: [phone]

## 2021-10-02 NOTE — Plan of Care (Signed)

## 2021-10-02 NOTE — Progress Notes (Signed)
[facility]  ROUNDING NOTE   Subjective:   Nichole Wiley is a 41 year old female with past medical history including GERD, hypertension, arthritis, diabetes, and end-stage renal disease on peritoneal dialysis.  Patient presents to the emergency department with complaints of muscle spasms.  Patient will be admitted for Symptomatic anemia [D64.9]  Patient is known to our practice and is followed with outpatient dialysis at [facility], supervised by [name].  Patient states she has been a PD patient for almost 2 years.  Patient seen and evaluated during dialysis   HEMODIALYSIS FLOWSHEET:  Blood Flow Rate (mL/min): 200 mL/min Arterial Pressure (mmHg): -90 mmHg Venous Pressure (mmHg): 70 mmHg TMP (mmHg): 21 mmHg Ultrafiltration Rate (mL/min): 300 mL/min Dialysate Flow Rate (mL/min): 200 ml/min Dialysis Fluid Bolus: Normal Saline Bolus Amount (mL): 300 mL  Tolerating treatment well Complains of mild soreness at temp cath site. Tremors appear reduced today  Objective:  Vital signs in last 24 hours:  Temp:  [97.5 F (36.4 C)-99 F (37.2 C)] 98.1 F (36.7 C) (07/26 1032) Pulse Rate:  [72-85] 85 (07/26 1032) Resp:  [9-20] 18 (07/26 1032) BP: (150-179)/(74-102) 179/86 (07/26 1032) SpO2:  [97 %-100 %] 100 % (07/26 1032) Weight:  [88.3 kg-88.6 kg] 88.3 kg (07/26 0953)  Weight change: 3.777 kg Filed Weights   10/02/21 0427 10/02/21 0739 10/02/21 0953  Weight: 88.6 kg 88.6 kg 88.3 kg    Intake/Output: I/O last 3 completed shifts: In: 8182 [P.O.:120; Blood:360; Other:3600] Out: 3600 [Other:3600]   Intake/Output this shift:  No intake/output data recorded.  Physical Exam: General: NAD  Head: Normocephalic, atraumatic. Moist oral mucosal membranes  Eyes: Anicteric  Lungs:  Clear to auscultation, normal effort, room air  Heart: Regular rate and rhythm  Abdomen:  Soft, nontender, PD catheter  Extremities: 1+ peripheral edema.  Neurologic: Nonfocal,  moving all four extremities  Skin: No lesions  Access: PD catheter, right temp cath placed 10/01/2021 by Dr. Lucky Cowboy    Basic Metabolic Panel: Recent Labs  Lab 09/30/21 0953 09/30/21 2220 10/01/21 0255 10/02/21 0439  NA 136 137 138 136  K 6.6* 5.6* 5.6* 5.5*  CL 102 101 103 103  CO2 21* '23 24 22  '$ GLUCOSE 63* 115* 104* 85  BUN 86* 87* 91* 85*  CREATININE 16.36* 15.86* 15.75* 15.63*  CALCIUM 8.4* 8.4* 8.3* 8.0*  MG 2.3  --   --   --   PHOS 9.6*  --   --   --      Liver Function Tests: Recent Labs  Lab 09/30/21 0953  AST 14*  ALT 15  ALKPHOS 59  BILITOT 0.4  PROT 6.1*  ALBUMIN 3.0*    No results for input(s): "LIPASE", "AMYLASE" in the last 168 hours. No results for input(s): "AMMONIA" in the last 168 hours.  CBC: Recent Labs  Lab 09/30/21 0953 10/01/21 0255 10/02/21 0439  WBC 9.1 8.6 8.2  NEUTROABS 6.5  --   --   HGB 6.9* 7.9* 7.3*  HCT 22.4* 24.8* 23.3*  MCV 94.1 89.2 90.7  PLT 212 184 161     Cardiac Enzymes: No results for input(s): "CKTOTAL", "CKMB", "CKMBINDEX", "TROPONINI" in the last 168 hours.  BNP: Invalid input(s): "POCBNP"  CBG: Recent Labs  Lab 09/30/21 1342  GLUCAP 109*     Microbiology: Results for orders placed or performed in visit on 06/25/17  Microscopic Examination     Status: Abnormal   Collection Time: 06/25/17 10:15 AM   URINE  Result Value Ref  Range Status   WBC, UA 11-30 (A) 0 - 5 /hpf Final   RBC, UA 3-10 (A) 0 - 2 /hpf Final   Epithelial Cells (non renal) 0-10 0 - 10 /hpf Final   Casts Present (A) None seen /lpf Final   Cast Type Hyaline casts N/A Final   Mucus, UA Present (A) Not Estab. Final   Bacteria, UA Moderate (A) None seen/Few Final  CULTURE, URINE COMPREHENSIVE     Status: None   Collection Time: 06/25/17 12:16 PM   Specimen: Urine   BL  Result Value Ref Range Status   Urine Culture, Comprehensive Final report  Final   Organism ID, Bacteria Comment  Final    Comment: Mixed urogenital flora Greater  than 100,000 colony forming units per mL     Coagulation Studies: No results for input(s): "LABPROT", "INR" in the last 72 hours.  Urinalysis: No results for input(s): "COLORURINE", "LABSPEC", "PHURINE", "GLUCOSEU", "HGBUR", "BILIRUBINUR", "KETONESUR", "PROTEINUR", "UROBILINOGEN", "NITRITE", "LEUKOCYTESUR" in the last 72 hours.  Invalid input(s): "APPERANCEUR"    Imaging: PERIPHERAL VASCULAR CATHETERIZATION  Result Date: 10/01/2021 See surgical note for result.  DG Chest Portable 1 View  Result Date: 09/30/2021 CLINICAL DATA:  Shortness of breath.  On dialysis. EXAM: PORTABLE CHEST 1 VIEW COMPARISON:  09/20/2021 FINDINGS: The heart size and mediastinal contours are within normal limits. Mild bilateral interstitial opacities most pronounced in the perihilar and bibasilar regions. No pleural effusion or pneumothorax. The visualized skeletal structures are unremarkable. IMPRESSION: Mild bilateral interstitial opacities most pronounced in the perihilar and bibasilar regions. Findings may reflect interstitial edema versus atypical/viral infection. Electronically Signed   By: Davina Poke D.O.   On: 09/30/2021 12:58     Medications:    dialysis solution 2.5% low-MG/low-CA      Chlorhexidine Gluconate Cloth  6 each Topical Q0600   Darbepoetin Alfa  25 mcg Subcutaneous Q7 days   gentamicin cream  1 Application Topical Daily   labetalol  100 mg Oral TID   levothyroxine  25 mcg Oral Daily   methadone  120 mg Oral Daily   pantoprazole  40 mg Oral Daily   sodium chloride flush  3 mL Intravenous Q12H   sodium zirconium cyclosilicate  10 g Oral BID     Assessment/ Plan:  [name] is a 41 y.o.  female with past medical history including GERD, hypertension, arthritis, diabetes, and end-stage renal disease on peritoneal dialysis.  Patient presents to the emergency department with complaints of muscle spasms.  Patient will be admitted for Symptomatic anemia  [D64.9]   Hyperkalemia with end-stage renal disease on peritoneal dialysis.    Potassium currently 5.5.  This will correct with dialysis treatment on 2K bath.  Patient currently tolerating first treatment well.  Encouraged to request Tylenol if needed for temp cath site soreness.  Will reorder labs this afternoon and determine need for dialysis tomorrow.  Hemodialysis considered temporary, patient will resume peritoneal dialysis when able.  2. Anemia of chronic kidney disease Lab Results  Component Value Date   HGB 7.3 (L) 10/02/2021    Patient has received blood transfusions during this admission.  Hemoglobin remains below target.  Patient prescribed Aranesp subcu weekly yesterday.  3. Secondary Hyperparathyroidism:  Lab Results  Component Value Date   CALCIUM 8.0 (L) 10/02/2021   PHOS 9.6 (H) 09/30/2021    We will continue to monitor bone minerals with afternoon labs today.  4. 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  Most recent hemoglobin A1c is 5 on 04/27/21.      LOS: 2 Andrik Sandt 7/26/202310:37 AM

## 2021-10-02 NOTE — Progress Notes (Signed)
Mobility Specialist - Progress Note    10/02/21 1700  Mobility  Activity Ambulated independently in hallway  Level of [place] wheel walker  Distance Ambulated (ft) 500 ft  Activity Response Tolerated well  $Mobility charge 1 Mobility    Pt ambulated more than 552f with RW indep, voicing no complaints. Tolerates well and is left with family present for more ambulation.  MMerrily BrittleMobility Specialist 10/02/21, 5:01 PM

## 2021-10-02 NOTE — Progress Notes (Signed)
Hd tx of 2hrs comleted. 24L total volume processed. No complications noted. Report given to primary nurse Rodvegas ingram, rn Total uf removed: 59m Post hd weight: 88.6kg Post hd v/s: 98.0 177/84(110) 80 12 100%

## 2021-10-02 NOTE — Progress Notes (Signed)
Patient tolerated treatment well, no fluid was pulled today. Post weight 88.3KG

## 2021-10-03 ENCOUNTER — Encounter: Payer: Self-pay | Admitting: Nephrology

## 2021-10-03 DIAGNOSIS — R259 Unspecified abnormal involuntary movements: Secondary | ICD-10-CM | POA: Diagnosis not present

## 2021-10-03 LAB — BASIC METABOLIC PANEL
Anion gap: 8 (ref 5–15)
BUN: 64 mg/dL — ABNORMAL HIGH (ref 6–20)
CO2: 26 mmol/L (ref 22–32)
Calcium: 7.8 mg/dL — ABNORMAL LOW (ref 8.9–10.3)
Chloride: 103 mmol/L (ref 98–111)
Creatinine, Ser: 12.81 mg/dL — ABNORMAL HIGH (ref 0.44–1.00)
GFR, Estimated: 3 mL/min — ABNORMAL LOW (ref >60)
Glucose, Bld: 77 mg/dL (ref 70–99)
Potassium: 5 mmol/L (ref 3.5–5.1)
Sodium: 137 mmol/L (ref 135–145)

## 2021-10-03 MED ORDER — LABETALOL HCL 100 MG PO TABS: 100.0000 mg | ORAL_TABLET | Freq: Three times a day (TID) | ORAL | 3 refills | Status: AC

## 2021-10-03 MED ORDER — GENTAMICIN SULFATE 0.1 % EX CREA: 1.0000 | TOPICAL_CREAM | Freq: Every day | CUTANEOUS | 0 refills | Status: AC

## 2021-10-03 MED ORDER — AMLODIPINE BESYLATE 10 MG PO TABS: 10.0000 mg | ORAL_TABLET | Freq: Every day | ORAL | 3 refills | Status: AC

## 2021-10-03 NOTE — Progress Notes (Signed)
Discharge instructions provided to patient. All medications, follow up appointments, and discharge instructions provided. IV out. Monitor off CCMD notified. Discharging to home.  °Brice Potteiger R Molley Houser, RN  °

## 2021-10-03 NOTE — Discharge Instructions (Signed)
Keep log of BP at home Resume your PD as per instructions

## 2021-10-03 NOTE — Discharge Summary (Signed)
Physician Discharge Summary   Patient: Nichole Wiley MRN: [id number] DOB: 11/09/1980  Admit date:     09/30/2021  Discharge date: 10/03/21  Discharge Physician: Fritzi Mandes   PCP: Jearld Fenton, NP   Recommendations at discharge:    Resume your PD as per Nephrology instructions F/U PCP in 1-2 weeks  Discharge Diagnoses:   Hospital Course: 41 year old female with past medical history of diabetes and end-stage renal disease on peritoneal dialysis but has been noncompliant with peritoneal dialysis, hypertension and GERD presents to the hospital with spasms and cramps and involuntary movements.  It started in her legs a couple weeks ago and then went to her arms and now her head.  Patient is talking to me during these episodes.  Has had some low sugars at home.  Was found to have hyperkalemia on presentation.  Patient was also given a unit of blood and hemoglobin of 6.9.  Assessment and Plan: Abnormal involuntary movements/Myoclonus suspected due to Uremia --Patient with abnormal involuntary movements of her upper extremities. -- suspect uremia with a very high creatinine and noncompliance with peritoneal dialysis is playing a role.   --Could also have a component of high-dose methadone and uremia contributing.  -- neurology consultation appreciated. -- Patient feels her involuntary movements are resolved   Symptomatic anemia due to ESRD Anemia of chronic disease.  Transfuse 1 unit of packed red blood cells yesterday and hemoglobin came up to 7.9.   Hyperkalemia Potassium down to 5.0 today.   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 Patient got right femoral HD access by Dr. Lucky Cowboy 10/01/21 --  Continue peritoneal dialysis as well per [name]. 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  With her creatinine  being very elevated and methadone likely is sticking around longer.  -- Start to taper methadone. Case discussed with pharmacist and will decrease down to 120 mg daily. -- Patient tells me she is on the taper program at the pain clinic in [place].   Hypothyroidism Continue Synthroid   GERD (gastroesophageal reflux disease) Patient on PPI   Type 2 diabetes mellitus with ESRD (end-stage renal disease) ([place]) -- sliding scale insulin -- sugars are within normal limits   Nicotine dependence Patient states that she is down to 3 cigarettes/day    overall stable. Ok to d/c per Nephrology. Pt agreeable   Procedures: hemodialysis, peritoneal dialysis Family communication : none today Consults : nephrology, neurology CODE STATUS: full DVT Prophylaxis : heparin      Disposition: Home Diet recommendation:  Discharge Diet Orders (From admission, onward)     Start     Ordered   10/03/21 0000  Diet - low sodium heart healthy        10/03/21 0950           Renal diet DISCHARGE MEDICATION: Allergies as of 10/03/2021       Reactions   Cephalexin Other (See Comments), Rash   Unknown   Penicillins Rash   Has patient had a PCN reaction causing immediate rash, facial/tongue/throat swelling, SOB or lightheadedness with hypotension: No Has patient had a PCN reaction causing severe rash involving mucus membranes or skin necrosis: No Has patient had a PCN reaction that required hospitalization: No Has patient had a PCN reaction occurring within the last 10 years: No If all of the  above answers are "NO", then may proceed with Cephalosporin use.        Medication List     STOP taking these medications    epoetin alfa 40000 UNIT/ML injection Commonly known as: EPOGEN       TAKE these medications    Accu-Chek Guide test strip Generic drug: glucose blood 1 each by Other route See admin instructions.   Accu-Chek Softclix Lancets lancets Use to check blood sugar daily for type  2 diabetes. E11.9   amLODipine 10 MG tablet Commonly known as: NORVASC Take 1 tablet (10 mg total) by mouth daily. Start taking on: October 04, 2021   aspirin EC 81 MG tablet Take 81 mg by mouth daily.   gentamicin cream 0.1 % Commonly known as: GARAMYCIN Apply 1 Application topically daily. Start taking on: October 04, 2021   labetalol 100 MG tablet Commonly known as: NORMODYNE Take 1 tablet (100 mg total) by mouth 3 (three) times daily.   levothyroxine 25 MCG tablet Commonly known as: SYNTHROID Take 1 tablet (25 mcg total) by mouth daily.   methadone 10 MG/ML solution Commonly known as: DOLOPHINE Take 120 mg by mouth daily. Changed by Dr Posey Pronto to get the right home dose w/o sending rx   naloxone 4 MG/0.1ML Liqd nasal spray kit Commonly known as: NARCAN One spray in either nostril once for known/suspected opioid overdose. May repeat every 2-3 minutes in alternating nostril til EMS arrives   omeprazole 20 MG capsule Commonly known as: PRILOSEC Take 1 capsule (20 mg total) by mouth daily.               Discharge Care Instructions  (From admission, onward)           Start     Ordered   10/03/21 0000  Discharge wound care:       Comments:    Exit site care:  Once      Comments: Clean skin near exit site with chloraprep swab sticks.  Starting at catheter, use circular pattern around exit site, moving towards outer edges of area covered by dressing.  Apply gentamicin cream to site once daily.  Cover with dry dressing.  09/30/21 1435     10/03/21 0950            Follow-up Information     Jearld Fenton, NP. Schedule an appointment as soon as possible for a visit in 1 week(s).   Specialties: Internal Medicine, Emergency Medicine Why: hospital f/u Contact information: [address] [id number] [phone]                Discharge Exam: Filed Weights   10/02/21 0739 10/02/21 0953 10/03/21 0700  Weight: 88.6 kg 88.3 kg 87.8 kg      Condition at discharge: fair  The results of significant diagnostics from this hospitalization (including imaging, microbiology, ancillary and laboratory) are listed below for reference.   Imaging Studies: PERIPHERAL VASCULAR CATHETERIZATION  Result Date: 10/01/2021 See surgical note for result.  DG Chest Portable 1 View  Result Date: 09/30/2021 CLINICAL DATA:  Shortness of breath.  On dialysis. EXAM: PORTABLE CHEST 1 VIEW COMPARISON:  09/20/2021 FINDINGS: The heart size and mediastinal contours are within normal limits. Mild bilateral interstitial opacities most pronounced in the perihilar and bibasilar regions. No pleural effusion or pneumothorax. The visualized skeletal structures are unremarkable. IMPRESSION: Mild bilateral interstitial opacities most pronounced in the perihilar and bibasilar regions. Findings may reflect interstitial edema versus atypical/viral infection. Electronically  Signed   By: Davina Poke D.O.   On: 09/30/2021 12:58   DG Chest 2 View  Result Date: 09/23/2021 CLINICAL DATA:  Shortness of breath for 1 week. EXAM: CHEST - 2 VIEW COMPARISON:  June 04 2007 FINDINGS: The heart size and mediastinal contours are within normal limits. Mild patchy opacity of left lung base is noted. The visualized skeletal structures are unremarkable. IMPRESSION: Mild patchy opacity of left lung base, suspicious for pneumonia. Electronically Signed   By: Abelardo Diesel M.D.   On: 09/23/2021 11:36    Microbiology: Results for orders placed or performed in visit on 06/25/17  Microscopic Examination     Status: Abnormal   Collection Time: 06/25/17 10:15 AM   URINE  Result Value Ref Range Status   WBC, UA 11-30 (A) 0 - 5 /hpf Final   RBC, UA 3-10 (A) 0 - 2 /hpf Final   Epithelial Cells (non renal) 0-10 0 - 10 /hpf Final   Casts Present (A) None seen /lpf Final   Cast Type Hyaline casts N/A Final   Mucus, UA Present (A) Not Estab. Final   Bacteria, UA Moderate (A) None seen/Few  Final  CULTURE, URINE COMPREHENSIVE     Status: None   Collection Time: 06/25/17 12:16 PM   Specimen: Urine   BL  Result Value Ref Range Status   Urine Culture, Comprehensive Final report  Final   Organism ID, Bacteria Comment  Final    Comment: Mixed urogenital flora Greater than 100,000 colony forming units per mL     Labs: CBC: Recent Labs  Lab 09/30/21 0953 10/01/21 0255 10/02/21 0439  WBC 9.1 8.6 8.2  NEUTROABS 6.5  --   --   HGB 6.9* 7.9* 7.3*  HCT 22.4* 24.8* 23.3*  MCV 94.1 89.2 90.7  PLT 212 184 211   Basic Metabolic Panel: Recent Labs  Lab 09/30/21 0953 09/30/21 2220 10/01/21 0255 10/02/21 0439 10/02/21 1514 10/03/21 0554  NA 136 137 138 136 136 137  K 6.6* 5.6* 5.6* 5.5* 4.7 5.0  CL 102 101 103 103 102 103  CO2 21* _0 GLUCOSE 63* 115* 104* 85 152* 77  BUN 86* 87* 91* 85* 58* 64*  CREATININE 16.36* 15.86* 15.75* 15.63* 12.44* 12.81*  CALCIUM 8.4* 8.4* 8.3* 8.0* 7.7* 7.8*  MG 2.3  --   --   --   --   --   PHOS 9.6*  --   --   --  7.0*  --    Liver Function Tests: Recent Labs  Lab 09/30/21 0953 10/02/21 1514  AST 14*  --   ALT 15  --   ALKPHOS 59  --   BILITOT 0.4  --   PROT 6.1*  --   ALBUMIN 3.0* 2.5*   CBG: Recent Labs  Lab 09/30/21 1342  GLUCAP 109*    Discharge time spent: greater than 30 minutes.  Signed: Fritzi Mandes, MD [facility] 10/03/2021

## 2021-10-03 NOTE — Progress Notes (Signed)
Neurology Progress Note   S:// Seen and examined Almost no twitching today.   O:// Current vital signs: BP (!) 143/76 (BP Location: Right Arm)   Pulse 77   Temp 97.9 F (36.6 C)   Resp 17   Ht '5\' 7"'$  (1.702 m)   Wt 87.8 kg   SpO2 98%   BMI 30.32 kg/m  Vital signs in last 24 hours: Temp:  [97.8 F (36.6 C)-98.5 F (36.9 C)] 97.9 F (36.6 C) (07/27 0807) Pulse Rate:  [74-85] 77 (07/27 0807) Resp:  [17-18] 17 (07/27 0807) BP: (126-179)/(63-86) 143/76 (07/27 0807) SpO2:  [97 %-100 %] 98 % (07/27 0807) Weight:  [87.8 kg] 87.8 kg (07/27 0700) General: Awake alert in no distress HEENT: Normocephalic atraumatic Lungs: Clear Cardiovascular: Regular rate rhythm Abdomen nondistended nontender Neurological exam Awake alert oriented x2 No dysarthria No aphasia Cranial nerves II to XII intact Motor examination with no drift.  No frank asterixis or twitching noted on outstretched arms.. Sensation intact light touch without extinction Coordination exam with no gross dysmetria, tremors also very subtle compared to the prior 2 days  Medications  Current Facility-Administered Medications:    acetaminophen (TYLENOL) tablet 650 mg, 650 mg, Oral, Q6H PRN **OR** acetaminophen (TYLENOL) suppository 650 mg, 650 mg, Rectal, Q6H PRN, Agbata, Tochukwu, MD   amLODipine (NORVASC) tablet 10 mg, 10 mg, Oral, Daily, Fritzi Mandes, MD, 10 mg at 10/03/21 0820   Chlorhexidine Gluconate Cloth 2 % PADS 6 each, 6 each, Topical, Q0600, Colon Flattery, NP, 6 each at 10/02/21 0549   Darbepoetin Alfa (ARANESP) injection 25 mcg, 25 mcg, Subcutaneous, Q7 days, Agbata, Tochukwu, MD   gentamicin cream (GARAMYCIN) 0.1 % 1 Application, 1 Application, Topical, Daily, Murlean Iba, MD, 1 Application at 09/32/35 0829   labetalol (NORMODYNE) tablet 100 mg, 100 mg, Oral, TID, Agbata, Tochukwu, MD, 100 mg at 10/03/21 5732   levothyroxine (SYNTHROID) tablet 25 mcg, 25 mcg, Oral, Daily, Agbata, Tochukwu, MD, 25 mcg at  10/03/21 0531   methadone (DOLOPHINE) 10 MG/ML solution 120 mg, 120 mg, Oral, Daily, Beers, Shanon Brow, RPH, 120 mg at 10/03/21 0911   ondansetron (ZOFRAN) tablet 4 mg, 4 mg, Oral, Q6H PRN **OR** ondansetron (ZOFRAN) injection 4 mg, 4 mg, Intravenous, Q6H PRN, Agbata, Tochukwu, MD   pantoprazole (PROTONIX) EC tablet 40 mg, 40 mg, Oral, Daily, Agbata, Tochukwu, MD, 40 mg at 10/03/21 0820   sodium chloride flush (NS) 0.9 % injection 3 mL, 3 mL, Intravenous, Q12H, Agbata, Tochukwu, MD, 3 mL at 10/03/21 0822 Labs CBC    Component Value Date/Time   WBC 8.2 10/02/2021 0439   RBC 2.57 (L) 10/02/2021 0439   HGB 7.3 (L) 10/02/2021 0439   HGB 12.8 01/20/2012 1610   HCT 23.3 (L) 10/02/2021 0439   HCT 36.8 01/20/2012 1610   PLT 161 10/02/2021 0439   PLT 95 (L) 01/20/2012 1610   MCV 90.7 10/02/2021 0439   MCV 89 01/20/2012 1610   MCH 28.4 10/02/2021 0439   MCHC 31.3 10/02/2021 0439   RDW 15.5 10/02/2021 0439   RDW 12.6 01/20/2012 1610   LYMPHSABS 1.5 09/30/2021 0953   MONOABS 0.4 09/30/2021 0953   EOSABS 0.6 (H) 09/30/2021 0953   BASOSABS 0.1 09/30/2021 0953    CMP     Component Value Date/Time   NA 137 10/03/2021 0554   NA 127 (L) 01/20/2012 1610   K 5.0 10/03/2021 0554   K 3.5 01/20/2012 1610   CL 103 10/03/2021 0554   CL 91 (L) 01/20/2012  1610   CO2 26 10/03/2021 0554   CO2 26 01/20/2012 1610   GLUCOSE 77 10/03/2021 0554   GLUCOSE 560 (HH) 01/20/2012 1610   BUN 64 (H) 10/03/2021 0554   BUN 10 01/20/2012 1610   CREATININE 12.81 (H) 10/03/2021 0554   CREATININE 15.56 (H) 09/20/2021 1445   CALCIUM 7.8 (L) 10/03/2021 0554   CALCIUM 8.7 01/20/2012 1610   PROT 6.1 (L) 09/30/2021 0953   PROT 7.5 01/20/2012 1610   ALBUMIN 2.5 (L) 10/02/2021 1514   ALBUMIN 2.9 (L) 01/20/2012 1610   AST 14 (L) 09/30/2021 0953   AST 23 01/20/2012 1610   ALT 15 09/30/2021 0953   ALT 43 01/20/2012 1610   ALKPHOS 59 09/30/2021 0953   ALKPHOS 235 (H) 01/20/2012 1610   BILITOT 0.4 09/30/2021 0953    BILITOT 0.9 01/20/2012 1610   GFRNONAA 3 (L) 10/03/2021 0554   GFRNONAA 71 09/28/2015 1524   GFRAA 35 (L) 11/23/2018 1719   GFRAA 82 09/28/2015 1524     Imaging I have reviewed images in epic and the results pertinent to this consultation are: No neuroimaging  Assessment: 41 year old with diabetes and ESRD along with other comorbidities, noncompliant to peritoneal dialysis presenting with abnormal myoclonic jerking appearing movements of her whole body which have improved after dialysis. Presentation consistent with polymyoclonus associated with uremia  Impression: Uremic polymyoclonus  Recommendations: No inpatient work-up from a neurological standpoint I do not think imaging is going to add any value Avoid nephrotoxic agents. Also avoid medications such as gabapentin which are known to cause myoclonic jerking movements in patients with impaired renal function. As a part of her general health, we discussed the importance of smoking cessation-she will discuss with her primary care provider further upon discharge. Inpatient neurology will be available as needed. Please call with questions   -- Amie Portland, MD Neurologist [facility] Pager: [phone]

## 2021-10-03 NOTE — Progress Notes (Signed)
[facility]  ROUNDING NOTE   Subjective:   Nichole Wiley is a 41 year old female with past medical history including GERD, hypertension, arthritis, diabetes, and end-stage renal disease on peritoneal dialysis.  Patient presents to the emergency department with complaints of muscle spasms.  Patient will be admitted for Symptomatic anemia [D64.9]  Patient is known to our practice and is followed with outpatient dialysis at [facility], supervised by Dr. Candiss Wiley.  Patient states she has been a PD patient for almost 2 years.  Patient seen sitting up in bed, alert and oriented No visible signs of muscle tremors or twitching Patient states she feels better Tolerating meals Remains on room air Requesting discharge Patient states her PD supplies were delivered during admission and have been on front porch for 2 days.  Patient questioning safety of products exposed to extreme heat.  Objective:  Vital signs in last 24 hours:  Temp:  [97.8 F (36.6 C)-98.5 F (36.9 C)] 97.9 F (36.6 C) (07/27 0807) Pulse Rate:  [75-81] 77 (07/27 0807) Resp:  [17-18] 17 (07/27 0807) BP: (126-150)/(63-76) 143/76 (07/27 0807) SpO2:  [97 %-100 %] 98 % (07/27 0807) Weight:  [87.8 kg] 87.8 kg (07/27 0700)  Weight change: 0 kg Filed Weights   10/02/21 0739 10/02/21 0953 10/03/21 0700  Weight: 88.6 kg 88.3 kg 87.8 kg    Intake/Output: I/O last 3 completed shifts: In: 88 [I.V.:3; Other:4300] Out: 4300 [Other:4300]   Intake/Output this shift:  No intake/output data recorded.  Physical Exam: General: NAD, sitting up in bed  Head: Normocephalic, atraumatic. Moist oral mucosal membranes  Eyes: Anicteric  Lungs:  Clear to auscultation, normal effort, room air  Heart: Regular rate and rhythm  Abdomen:  Soft, nontender, PD catheter  Extremities: 1+ peripheral edema.  Neurologic: Nonfocal, moving all four extremities  Skin: No lesions  Access: PD catheter, right temp cath placed 10/01/2021  by Dr. Lucky Wiley    Basic Metabolic Panel: Recent Labs  Lab 09/30/21 9381 09/30/21 2220 10/01/21 0255 10/02/21 0439 10/02/21 1514 10/03/21 0554  NA 136 137 138 136 136 137  K 6.6* 5.6* 5.6* 5.5* 4.7 5.0  CL 102 101 103 103 102 103  CO2 21* '23 24 22 25 26  '$ GLUCOSE 63* 115* 104* 85 152* 77  BUN 86* 87* 91* 85* 58* 64*  CREATININE 16.36* 15.86* 15.75* 15.63* 12.44* 12.81*  CALCIUM 8.4* 8.4* 8.3* 8.0* 7.7* 7.8*  MG 2.3  --   --   --   --   --   PHOS 9.6*  --   --   --  7.0*  --      Liver Function Tests: Recent Labs  Lab 09/30/21 0953 10/02/21 1514  AST 14*  --   ALT 15  --   ALKPHOS 59  --   BILITOT 0.4  --   PROT 6.1*  --   ALBUMIN 3.0* 2.5*    No results for input(s): "LIPASE", "AMYLASE" in the last 168 hours. No results for input(s): "AMMONIA" in the last 168 hours.  CBC: Recent Labs  Lab 09/30/21 0953 10/01/21 0255 10/02/21 0439  WBC 9.1 8.6 8.2  NEUTROABS 6.5  --   --   HGB 6.9* 7.9* 7.3*  HCT 22.4* 24.8* 23.3*  MCV 94.1 89.2 90.7  PLT 212 184 161     Cardiac Enzymes: No results for input(s): "CKTOTAL", "CKMB", "CKMBINDEX", "TROPONINI" in the last 168 hours.  BNP: Invalid input(s): "POCBNP"  CBG: Recent Labs  Lab 09/30/21 1342  GLUCAP 109*     Microbiology: Results for orders placed or performed in visit on 06/25/17  Microscopic Examination     Status: Abnormal   Collection Time: 06/25/17 10:15 AM   URINE  Result Value Ref Range Status   WBC, UA 11-30 (A) 0 - 5 /hpf Final   RBC, UA 3-10 (A) 0 - 2 /hpf Final   Epithelial Cells (non renal) 0-10 0 - 10 /hpf Final   Casts Present (A) None seen /lpf Final   Cast Type Hyaline casts N/A Final   Mucus, UA Present (A) Not Estab. Final   Bacteria, UA Moderate (A) None seen/Few Final  CULTURE, URINE COMPREHENSIVE     Status: None   Collection Time: 06/25/17 12:16 PM   Specimen: Urine   BL  Result Value Ref Range Status   Urine Culture, Comprehensive Final report  Final   Organism ID, Bacteria  Comment  Final    Comment: Mixed urogenital flora Greater than 100,000 colony forming units per mL     Coagulation Studies: No results for input(s): "LABPROT", "INR" in the last 72 hours.  Urinalysis: No results for input(s): "COLORURINE", "LABSPEC", "PHURINE", "GLUCOSEU", "HGBUR", "BILIRUBINUR", "KETONESUR", "PROTEINUR", "UROBILINOGEN", "NITRITE", "LEUKOCYTESUR" in the last 72 hours.  Invalid input(s): "APPERANCEUR"    Imaging: No results found.   Medications:         Assessment/ Plan:  [name] is a 41 y.o.  female with past medical history including GERD, hypertension, arthritis, diabetes, and end-stage renal disease on peritoneal dialysis.  Patient presents to the emergency department with complaints of muscle spasms.  Patient will be admitted for Symptomatic anemia [D64.9]   Hyperkalemia with end-stage renal disease on peritoneal dialysis.    Potassium 5.0.  Absence of muscle twitching.  We will place order to remove HD temp cath.  Patient informed that PD fluid must be disposed of.  Patient has been arranged appointment tomorrow morning at PD clinic to receive new supplies for the weekend until new shipment can be arranged.  Patient understands and agrees to report to [facility] at 9:30 AM tomorrow morning.  Patient cleared to discharge from renal stance.  Patient encouraged to continue nightly treatments to prevent reoccurrence of events.  2. Anemia of chronic kidney disease Lab Results  Component Value Date   HGB 7.3 (L) 10/02/2021    Patient has received blood transfusions during this admission.  Patient encouraged to maintain outpatient appointments to receive scheduled Mircera.  3. Secondary Hyperparathyroidism:  Lab Results  Component Value Date   CALCIUM 7.8 (L) 10/03/2021   PHOS 7.0 (H) 10/02/2021    We will continue to monitor bone minerals with afternoon labs today.  4. 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  Most recent hemoglobin A1c is 5 on 04/27/21.      LOS: 3 Nichole Wiley 7/27/20236:29 PM

## 2021-10-03 NOTE — Progress Notes (Signed)
Right temp HD cath removed per order/ tolerated well

## 2021-10-08 ENCOUNTER — Ambulatory Visit: Payer: Medicaid Other | Admitting: Internal Medicine

## 2021-10-08 NOTE — Progress Notes (Deleted)
Subjective:    Patient ID: Nichole Wiley, female    DOB: 19-Apr-1980, 41 y.o.   MRN: [id number]  HPI  Patient presents to clinic today for hospital follow-up.  She presented to the ER 7/24 with complaint of muscle spasms, cramps and involuntary movements of her head.  Her potassium was found to be very high because she is noncompliant with her peritoneal dialysis as well as being on high doses of Methadone.  Neurology was consulted.  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  They inserted a right femoral line so that she can get hemodialysis.  Her potassium came down to 5.  This line was subsequently DC'd and she was advised to continue her peritoneal dialysis.  Her hemoglobin was down to 6.9%, she was transfused 1 unit PRBC and hemoglobin stabilized at 7.9%.  Her Methadone was decreased to 120 mg daily.  She was discharged on 7/27 and advised to follow-up with her PCP and nephrology.  Since that time.  Review of Systems  Past Medical History:  Diagnosis Date   Abdominal mass    Arthritis    back   Chronic kidney disease    Colon polyp    Diabetes 1.5, managed as type 1 ([place])    Drug addiction (HCC)    hx (pain pills after c-sect)   GERD (gastroesophageal reflux disease)    Hypertension    Pelvic floor dysfunction    Urine incontinence     Current Outpatient Medications  Medication Sig Dispense Refill   Accu-Chek Softclix Lancets lancets Use to check blood sugar daily for type 2 diabetes. E11.9 100 each 4   amLODipine (NORVASC) 10 MG tablet Take 1 tablet (10 mg total) by mouth daily. 30 tablet 3   aspirin EC 81 MG tablet Take 81 mg by mouth daily.     gentamicin cream (GARAMYCIN) 0.1 % Apply 1 Application topically daily. 15 g 0   glucose blood (ACCU-CHEK GUIDE) test strip 1 each by Other route See admin instructions. 100 each 0   labetalol (NORMODYNE) 100 MG tablet Take 1 tablet (100 mg total) by mouth 3 (three) times daily. 90 tablet 3   levothyroxine (SYNTHROID) 25  MCG tablet Take 1 tablet (25 mcg total) by mouth daily. 90 tablet 0   methadone (DOLOPHINE) 10 MG/ML solution Take 120 mg by mouth daily. Changed by Dr Posey Pronto to get the right home dose w/o sending rx     naloxone [facility]) nasal spray 4 mg/0.1 mL One spray in either nostril once for known/suspected opioid overdose. May repeat every 2-3 minutes in alternating nostril til EMS arrives     omeprazole (PRILOSEC) 20 MG capsule Take 1 capsule (20 mg total) by mouth daily. 90 capsule 0   No current facility-administered medications for this visit.    Allergies  Allergen Reactions   Cephalexin Other (See Comments) and Rash    Unknown   Penicillins Rash    Has patient had a PCN reaction causing immediate rash, facial/tongue/throat swelling, SOB or lightheadedness with hypotension: No Has patient had a PCN reaction causing severe rash involving mucus membranes or skin necrosis: No Has patient had a PCN reaction that required hospitalization: No Has patient had a PCN reaction occurring within the last 10 years: No If all of the above answers are "NO", then may proceed with Cephalosporin use.    Family History  Problem Relation Age of Onset   Depression Mother    Diabetes Father  Heart disease Maternal Grandmother    Depression Maternal Grandmother    Dementia Maternal Grandmother    Diabetes Maternal Grandmother    Diabetes Paternal Grandmother     Social History   Socioeconomic History   Marital status: Single    Spouse name: Not on file   Number of children: 2   Years of education: Not on file   Highest education level: Not on file  Occupational History   Not on file  Tobacco Use   Smoking status: Every Day    Packs/day: 0.50    Years: 24.00    Total pack years: 12.00    Types: Cigarettes   Smokeless tobacco: Never   Tobacco comments:    has cut down from 2 PPD  Vaping Use   Vaping Use: Never used  Substance and Sexual Activity   Alcohol use: No   Drug use: No   Sexual  activity: Yes    Birth control/protection: Surgical  Other Topics Concern   Not on file  Social History Narrative   Not on file   Social Determinants of Health   Financial Resource Strain: Low Risk  (12/08/2018)   Overall Financial Resource Strain (CARDIA)    Difficulty of Paying Living Expenses: Not very hard  Food Insecurity: No Food Insecurity (12/08/2018)   Hunger Vital Sign    Worried About Running Out of Food in the Last Year: Never true    Ran Out of Food in the Last Year: Never true  Transportation Needs: No Transportation Needs (12/08/2018)   PRAPARE - Hydrologist (Medical): No    Lack of Transportation (Non-Medical): No  Physical Activity: Not on file  Stress: Not on file  Social Connections: Unknown (12/08/2018)   Social Connection and Isolation Panel [NHANES]    Frequency of Communication with Friends and Family: More than three times a week    Frequency of Social Gatherings with Friends and Family: Not on file    Attends Religious Services: Not on file    Active Member of Clubs or Organizations: Not on file    Attends Archivist Meetings: Not on file    Marital Status: Not on file  Intimate Partner Violence: Not At Risk (12/08/2018)   Humiliation, Afraid, Rape, and Kick questionnaire    Fear of Current or Ex-Partner: No    Emotionally Abused: No    Physically Abused: No    Sexually Abused: No     Constitutional: Denies fever, malaise, fatigue, headache or abrupt weight changes.  HEENT: Denies eye pain, eye redness, ear pain, ringing in the ears, wax buildup, runny nose, nasal congestion, bloody nose, or sore throat. Respiratory: Denies difficulty breathing, shortness of breath, cough or sputum production.   Cardiovascular: Denies chest pain, chest tightness, palpitations or swelling in the hands or feet.  Gastrointestinal: Denies abdominal pain, bloating, constipation, diarrhea or blood in the stool.  GU: Denies urgency,  frequency, pain with urination, burning sensation, blood in urine, odor or discharge. Musculoskeletal: Denies decrease in range of motion, difficulty with gait, muscle pain or joint pain and swelling.  Skin: Denies redness, rashes, lesions or ulcercations.  Neurological: Denies dizziness, difficulty with memory, difficulty with speech or problems with balance and coordination.  Psych: Denies anxiety, depression, SI/HI.  No other specific complaints in a complete review of systems (except as listed in HPI above).     Objective:   Physical Exam   There were no vitals taken for this visit.  Wt Readings from Last 3 Encounters:  10/03/21 193 lb 9 oz (87.8 kg)  09/20/21 187 lb (84.8 kg)  08/19/21 182 lb (82.6 kg)    General: Appears their stated age, well developed, well nourished in NAD. Skin: Warm, dry and intact. No rashes, lesions or ulcerations noted. HEENT: Head: normal shape and size; Eyes: sclera white, no icterus, conjunctiva pink, PERRLA and EOMs intact; Ears: Tm's gray and intact, normal light reflex; Nose: mucosa pink and moist, septum midline; Throat/Mouth: Teeth present, mucosa pink and moist, no exudate, lesions or ulcerations noted.  Neck:  Neck supple, trachea midline. No masses, lumps or thyromegaly present.  Cardiovascular: Normal rate and rhythm. S1,S2 noted.  No murmur, rubs or gallops noted. No JVD or BLE edema. No carotid bruits noted. Pulmonary/Chest: Normal effort and positive vesicular breath sounds. No respiratory distress. No wheezes, rales or ronchi noted.  Abdomen: Soft and nontender. Normal bowel sounds. No distention or masses noted. Liver, spleen and kidneys non palpable. Musculoskeletal: Normal range of motion. No signs of joint swelling. No difficulty with gait.  Neurological: Alert and oriented. Cranial nerves II-XII grossly intact. Coordination normal.  Psychiatric: Mood and affect normal. Behavior is normal. Judgment and thought content normal.    BMET     Component Value Date/Time   NA 137 10/03/2021 0554   NA 127 (L) 01/20/2012 1610   K 5.0 10/03/2021 0554   K 3.5 01/20/2012 1610   CL 103 10/03/2021 0554   CL 91 (L) 01/20/2012 1610   CO2 26 10/03/2021 0554   CO2 26 01/20/2012 1610   GLUCOSE 77 10/03/2021 0554   GLUCOSE 560 (HH) 01/20/2012 1610   BUN 64 (H) 10/03/2021 0554   BUN 10 01/20/2012 1610   CREATININE 12.81 (H) 10/03/2021 0554   CREATININE 15.56 (H) 09/20/2021 1445   CALCIUM 7.8 (L) 10/03/2021 0554   CALCIUM 8.7 01/20/2012 1610   GFRNONAA 3 (L) 10/03/2021 0554   GFRNONAA 71 09/28/2015 1524   GFRAA 35 (L) 11/23/2018 1719   GFRAA 82 09/28/2015 1524    Lipid Panel     Component Value Date/Time   CHOL 258 (H) 11/23/2018 1719   TRIG 113 11/23/2018 1719   HDL 55 11/23/2018 1719   CHOLHDL 4.7 11/23/2018 1719   VLDL 23 11/23/2018 1719   LDLCALC 180 (H) 11/23/2018 1719    CBC    Component Value Date/Time   WBC 8.2 10/02/2021 0439   RBC 2.57 (L) 10/02/2021 0439   HGB 7.3 (L) 10/02/2021 0439   HGB 12.8 01/20/2012 1610   HCT 23.3 (L) 10/02/2021 0439   HCT 36.8 01/20/2012 1610   PLT 161 10/02/2021 0439   PLT 95 (L) 01/20/2012 1610   MCV 90.7 10/02/2021 0439   MCV 89 01/20/2012 1610   MCH 28.4 10/02/2021 0439   MCHC 31.3 10/02/2021 0439   RDW 15.5 10/02/2021 0439   RDW 12.6 01/20/2012 1610   LYMPHSABS 1.5 09/30/2021 0953   MONOABS 0.4 09/30/2021 0953   EOSABS 0.6 (H) 09/30/2021 0953   BASOSABS 0.1 09/30/2021 0953    Hgb A1C Lab Results  Component Value Date   HGBA1C 4.8 10/01/2021           Assessment & Plan:   Hospital Follow Up for Myoclonus secondary to Uremia, Hyperkalemia, Anemia due to CKD, ESRD on PD:  Hospital notes, labs and imaging reviewed  RTC in 3 months for annual exam Webb Silversmith, NP

## 2021-10-11 ENCOUNTER — Inpatient Hospital Stay: Payer: Medicaid Other | Admitting: Internal Medicine

## 2021-10-11 NOTE — Progress Notes (Deleted)
Subjective:    Patient ID: Nichole Wiley, female    DOB: [birth year], 41 y.o.   MRN: [id number]  HPI  Pt presents to the clinic today for hospital followup. She presented to the ER 7/24 with c/o abnormal movements and spasms. She had been noncompliant with her PD. She was found to be hyperkalemic and anemic with a hemoglobin of 6.9. Nephrology was consulted. 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 She was transfused 1 unit PRBC, Hgb up to 7.9.  Vascular was consulted and a temporary femoral catheter was placed for hemodialysis.  There was some concern that her high doses of methadone were also contributing to her symptoms.  She was discharged on 7/27, advised to follow-up with her PCP and nephrology.  Since that time.  Review of Systems     Past Medical History:  Diagnosis Date   Abdominal mass    Arthritis    back   Chronic kidney disease    Colon polyp    Diabetes 1.5, managed as type 1 ([place])    Drug addiction (H[facility])    hx (pain pills after c-sect)   GERD (gastroesophageal reflux disease)    Hypertension    Pelvic floor dysfunction    Urine incontinence     Current Outpatient Medications  Medication Sig Dispense Refill   Accu-Chek Softclix Lancets lancets Use to check blood sugar daily for type 2 diabetes. E11.9 100 each 4   amLODipine (NORVASC) 10 MG tablet Take 1 tablet (10 mg total) by mouth daily. 30 tablet 3   aspirin EC 81 MG tablet Take 81 mg by mouth daily.     gentamicin cream (GARAMYCIN) 0.1 % Apply 1 Application topically daily. 15 g 0   glucose blood (ACCU-CHEK GUIDE) test strip 1 each by Other route See admin instructions. 100 each 0   labetalol (NORMODYNE) 100 MG tablet Take 1 tablet (100 mg total) by mouth 3 (three) times daily. 90 tablet 3   levothyroxine (SYNTHROID) 25 MCG tablet Take 1 tablet (25 mcg total) by mouth daily. 90 tablet 0   methadone (DOLOPHINE) 10 MG/ML solution Take 120 mg by mouth daily. Changed by Dr Posey Pronto to get the right home  dose w/o sending rx     naloxone [facility]) nasal spray 4 mg/0.1 mL One spray in either nostril once for known/suspected opioid overdose. May repeat every 2-3 minutes in alternating nostril til EMS arrives     omeprazole (PRILOSEC) 20 MG capsule Take 1 capsule (20 mg total) by mouth daily. 90 capsule 0   No current facility-administered medications for this visit.    Allergies  Allergen Reactions   Cephalexin Other (See Comments) and Rash    Unknown   Penicillins Rash    Has patient had a PCN reaction causing immediate rash, facial/tongue/throat swelling, SOB or lightheadedness with hypotension: No Has patient had a PCN reaction causing severe rash involving mucus membranes or skin necrosis: No Has patient had a PCN reaction that required hospitalization: No Has patient had a PCN reaction occurring within the last 10 years: No If all of the above answers are "NO", then may proceed with Cephalosporin use.    Family History  Problem Relation Age of Onset   Depression Mother    Diabetes Father    Heart disease Maternal Grandmother    Depression Maternal Grandmother    Dementia Maternal Grandmother    Diabetes Maternal Grandmother    Diabetes Paternal Grandmother     Social History  Socioeconomic History   Marital status: Single    Spouse name: Not on file   Number of children: 2   Years of education: Not on file   Highest education level: Not on file  Occupational History   Not on file  Tobacco Use   Smoking status: Every Day    Packs/day: 0.50    Years: 24.00    Total pack years: 12.00    Types: Cigarettes   Smokeless tobacco: Never   Tobacco comments:    has cut down from 2 PPD  Vaping Use   Vaping Use: Never used  Substance and Sexual Activity   Alcohol use: No   Drug use: No   Sexual activity: Yes    Birth control/protection: Surgical  Other Topics Concern   Not on file  Social History Narrative   Not on file   Social Determinants of Health   Financial  Resource Strain: Low Risk  (12/08/2018)   Overall Financial Resource Strain (CARDIA)    Difficulty of Paying Living Expenses: Not very hard  Food Insecurity: No Food Insecurity (12/08/2018)   Hunger Vital Sign    Worried About Running Out of Food in the Last Year: Never true    Ran Out of Food in the Last Year: Never true  Transportation Needs: No Transportation Needs (12/08/2018)   PRAPARE - Hydrologist (Medical): No    Lack of Transportation (Non-Medical): No  Physical Activity: Not on file  Stress: Not on file  Social Connections: Unknown (12/08/2018)   Social Connection and Isolation Panel [NHANES]    Frequency of Communication with Friends and Family: More than three times a week    Frequency of Social Gatherings with Friends and Family: Not on file    Attends Religious Services: Not on file    Active Member of Clubs or Organizations: Not on file    Attends Archivist Meetings: Not on file    Marital Status: Not on file  Intimate Partner Violence: Not At Risk (12/08/2018)   Humiliation, Afraid, Rape, and Kick questionnaire    Fear of Current or Ex-Partner: No    Emotionally Abused: No    Physically Abused: No    Sexually Abused: No     Constitutional: Denies fever, malaise, fatigue, headache or abrupt weight changes.  HEENT: Denies eye pain, eye redness, ear pain, ringing in the ears, wax buildup, runny nose, nasal congestion, bloody nose, or sore throat. Respiratory: Denies difficulty breathing, shortness of breath, cough or sputum production.   Cardiovascular: Denies chest pain, chest tightness, palpitations or swelling in the hands or feet.  Gastrointestinal: Denies abdominal pain, bloating, constipation, diarrhea or blood in the stool.  GU: Denies urgency, frequency, pain with urination, burning sensation, blood in urine, odor or discharge. Musculoskeletal: Denies decrease in range of motion, difficulty with gait, muscle pain or joint  pain and swelling.  Skin: Denies redness, rashes, lesions or ulcercations.  Neurological: Denies dizziness, difficulty with memory, difficulty with speech or problems with balance and coordination.  Psych: Denies anxiety, depression, SI/HI.  No other specific complaints in a complete review of systems (except as listed in HPI above).  Objective:   Physical Exam  There were no vitals taken for this visit. Wt Readings from Last 3 Encounters:  10/03/21 193 lb 9 oz (87.8 kg)  09/20/21 187 lb (84.8 kg)  08/19/21 182 lb (82.6 kg)    General: Appears their stated age, well developed, well nourished in NAD.  Skin: Warm, dry and intact. No rashes, lesions or ulcerations noted. HEENT: Head: normal shape and size; Eyes: sclera white, no icterus, conjunctiva pink, PERRLA and EOMs intact; Ears: Tm's gray and intact, normal light reflex; Nose: mucosa pink and moist, septum midline; Throat/Mouth: Teeth present, mucosa pink and moist, no exudate, lesions or ulcerations noted.  Neck:  Neck supple, trachea midline. No masses, lumps or thyromegaly present.  Cardiovascular: Normal rate and rhythm. S1,S2 noted.  No murmur, rubs or gallops noted. No JVD or BLE edema. No carotid bruits noted. Pulmonary/Chest: Normal effort and positive vesicular breath sounds. No respiratory distress. No wheezes, rales or ronchi noted.  Abdomen: Soft and nontender. Normal bowel sounds. No distention or masses noted. Liver, spleen and kidneys non palpable. Musculoskeletal: Normal range of motion. No signs of joint swelling. No difficulty with gait.  Neurological: Alert and oriented. Cranial nerves II-XII grossly intact. Coordination normal.  Psychiatric: Mood and affect normal. Behavior is normal. Judgment and thought content normal.    BMET    Component Value Date/Time   NA 137 10/03/2021 0554   NA 127 (L) 01/20/2012 1610   K 5.0 10/03/2021 0554   K 3.5 01/20/2012 1610   CL 103 10/03/2021 0554   CL 91 (L) 01/20/2012  1610   CO2 26 10/03/2021 0554   CO2 26 01/20/2012 1610   GLUCOSE 77 10/03/2021 0554   GLUCOSE 560 (HH) 01/20/2012 1610   BUN 64 (H) 10/03/2021 0554   BUN 10 01/20/2012 1610   CREATININE 12.81 (H) 10/03/2021 0554   CREATININE 15.56 (H) 09/20/2021 1445   CALCIUM 7.8 (L) 10/03/2021 0554   CALCIUM 8.7 01/20/2012 1610   GFRNONAA 3 (L) 10/03/2021 0554   GFRNONAA 71 09/28/2015 1524   GFRAA 35 (L) 11/23/2018 1719   GFRAA 82 09/28/2015 1524    Lipid Panel     Component Value Date/Time   CHOL 258 (H) 11/23/2018 1719   TRIG 113 11/23/2018 1719   HDL 55 11/23/2018 1719   CHOLHDL 4.7 11/23/2018 1719   VLDL 23 11/23/2018 1719   LDLCALC 180 (H) 11/23/2018 1719    CBC    Component Value Date/Time   WBC 8.2 10/02/2021 0439   RBC 2.57 (L) 10/02/2021 0439   HGB 7.3 (L) 10/02/2021 0439   HGB 12.8 01/20/2012 1610   HCT 23.3 (L) 10/02/2021 0439   HCT 36.8 01/20/2012 1610   PLT 161 10/02/2021 0439   PLT 95 (L) 01/20/2012 1610   MCV 90.7 10/02/2021 0439   MCV 89 01/20/2012 1610   MCH 28.4 10/02/2021 0439   MCHC 31.3 10/02/2021 0439   RDW 15.5 10/02/2021 0439   RDW 12.6 01/20/2012 1610   LYMPHSABS 1.5 09/30/2021 0953   MONOABS 0.4 09/30/2021 0953   EOSABS 0.6 (H) 09/30/2021 0953   BASOSABS 0.1 09/30/2021 0953    Hgb A1C Lab Results  Component Value Date   HGBA1C 4.8 10/01/2021            Assessment & Plan:   Hospital follow-up for 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

## 2021-10-13 ENCOUNTER — Emergency Department
Admission: EM | Admit: 2021-10-13 | Discharge: 2021-10-14 | Disposition: A | Discharge status: Home / Self Care | Payer: Medicaid Other | Attending: Emergency Medicine | Admitting: Emergency Medicine

## 2021-10-13 ENCOUNTER — Emergency Department: Payer: Medicaid Other

## 2021-10-13 ENCOUNTER — Other Ambulatory Visit: Payer: Self-pay

## 2021-10-13 DIAGNOSIS — E875 Hyperkalemia: Secondary | ICD-10-CM | POA: Diagnosis not present

## 2021-10-13 DIAGNOSIS — Z992 Dependence on renal dialysis: Secondary | ICD-10-CM | POA: Insufficient documentation

## 2021-10-13 DIAGNOSIS — Z7982 Long term (current) use of aspirin: Secondary | ICD-10-CM | POA: Insufficient documentation

## 2021-10-13 DIAGNOSIS — I129 Hypertensive chronic kidney disease with stage 1 through stage 4 chronic kidney disease, or unspecified chronic kidney disease: Secondary | ICD-10-CM | POA: Diagnosis not present

## 2021-10-13 DIAGNOSIS — R600 Localized edema: Secondary | ICD-10-CM | POA: Insufficient documentation

## 2021-10-13 DIAGNOSIS — M62838 Other muscle spasm: Secondary | ICD-10-CM | POA: Diagnosis present

## 2021-10-13 DIAGNOSIS — E1022 Type 1 diabetes mellitus with diabetic chronic kidney disease: Secondary | ICD-10-CM | POA: Diagnosis not present

## 2021-10-13 DIAGNOSIS — N189 Chronic kidney disease, unspecified: Secondary | ICD-10-CM | POA: Diagnosis not present

## 2021-10-13 DIAGNOSIS — D649 Anemia, unspecified: Secondary | ICD-10-CM | POA: Insufficient documentation

## 2021-10-13 DIAGNOSIS — R609 Edema, unspecified: Secondary | ICD-10-CM

## 2021-10-13 DIAGNOSIS — Z79899 Other long term (current) drug therapy: Secondary | ICD-10-CM | POA: Diagnosis not present

## 2021-10-13 LAB — COMPREHENSIVE METABOLIC PANEL
ALT: 15 U/L (ref 0–44)
AST: 13 U/L — ABNORMAL LOW (ref 15–41)
Albumin: 2.9 g/dL — ABNORMAL LOW (ref 3.5–5.0)
Alkaline Phosphatase: 71 U/L (ref 38–126)
Anion gap: 12 (ref 5–15)
BUN: 76 mg/dL — ABNORMAL HIGH (ref 6–20)
CO2: 18 mmol/L — ABNORMAL LOW (ref 22–32)
Calcium: 8.4 mg/dL — ABNORMAL LOW (ref 8.9–10.3)
Chloride: 104 mmol/L (ref 98–111)
Creatinine, Ser: 15.85 mg/dL — ABNORMAL HIGH (ref 0.44–1.00)
GFR, Estimated: 3 mL/min — ABNORMAL LOW (ref >60)
Glucose, Bld: 94 mg/dL (ref 70–99)
Potassium: 5.6 mmol/L — ABNORMAL HIGH (ref 3.5–5.1)
Sodium: 134 mmol/L — ABNORMAL LOW (ref 135–145)
Total Bilirubin: 0.6 mg/dL (ref 0.3–1.2)
Total Protein: 6.1 g/dL — ABNORMAL LOW (ref 6.5–8.1)

## 2021-10-13 LAB — CBC
HCT: 23.4 % — ABNORMAL LOW (ref 36.0–46.0)
Hemoglobin: 7.3 g/dL — ABNORMAL LOW (ref 12.0–15.0)
MCH: 28.9 pg (ref 26.0–34.0)
MCHC: 31.2 g/dL (ref 30.0–36.0)
MCV: 92.5 fL (ref 80.0–100.0)
Platelets: 218 10*3/uL (ref 150–400)
RBC: 2.53 MIL/uL — ABNORMAL LOW (ref 3.87–5.11)
RDW: 14.7 % (ref 11.5–15.5)
WBC: 6.6 10*3/uL (ref 4.0–10.5)
nRBC: 0 % (ref 0.0–0.2)

## 2021-10-13 LAB — BRAIN NATRIURETIC PEPTIDE: B Natriuretic Peptide: 995.8 pg/mL — ABNORMAL HIGH (ref 0.0–100.0)

## 2021-10-13 LAB — TROPONIN I (HIGH SENSITIVITY): Troponin I (High Sensitivity): 114 ng/L (ref <18)

## 2021-10-13 LAB — MAGNESIUM: Magnesium: 2.4 mg/dL (ref 1.7–2.4)

## 2021-10-13 MED ORDER — SODIUM ZIRCONIUM CYCLOSILICATE 10 G PO PACK
10.0000 g | PACK | Freq: Once | ORAL | Status: AC
  Administered 2021-10-14: 10 g via ORAL
  Filled 2021-10-13: qty 1

## 2021-10-13 MED ORDER — FUROSEMIDE 10 MG/ML IJ SOLN
40.0000 mg | Freq: Once | INTRAMUSCULAR | Status: AC
  Administered 2021-10-13: 40 mg via INTRAVENOUS
  Filled 2021-10-13: qty 4

## 2021-10-13 MED ORDER — MORPHINE SULFATE (PF) 4 MG/ML IV SOLN
4.0000 mg | Freq: Once | INTRAVENOUS | Status: AC
  Administered 2021-10-13: 4 mg via INTRAVENOUS
  Filled 2021-10-13: qty 1

## 2021-10-13 MED ORDER — ONDANSETRON HCL 4 MG/2ML IJ SOLN
4.0000 mg | Freq: Once | INTRAMUSCULAR | Status: AC
Start: 2021-10-13 — End: 2021-10-13
  Administered 2021-10-13: 4 mg via INTRAVENOUS
  Filled 2021-10-13: qty 2

## 2021-10-13 NOTE — ED Notes (Signed)
Pt states has been having generalized body spasms. Pt states last time she had these symptoms she had a high serum potassium. Pt with generalized edema noted to arms, face, legs.

## 2021-10-13 NOTE — ED Triage Notes (Signed)
Pt presents to ER with c/o spasms in all extremities.  Pt states she does PD at home and was hospitalized here appx 2 weeks ago and had similar symptoms and was hyperkalemic.  Pt states she has been doing her tx at home as usual.  Pt also endorses some increased lower leg swelling and neck pain.  No sob per pt.  Pt is A&O x4 at this time in NAD in triage.

## 2021-10-13 NOTE — ED Provider Notes (Signed)
[facility] Provider Note    Event Date/Time   First MD Initiated Contact with Patient 10/13/21 2311     (approximate)   History   Spasms   HPI  Nichole Wiley is a 41 y.o. female who is on peritoneal dialysis daily, hypertension, diabetes who presents to the emergency department with complaints of diffuse leg cramps, neck discomfort started today.  States she had similar symptoms when she was hyperkalemic in July and required admission.  She states that she has not been able to complete full dialysis because she only had 5 bags left and had to stretch them out of the weekend.  She is going to be able to get new bags in the morning.  She states she feels fluid overloaded in her extremities but no chest pain or shortness of breath.  No abdominal pain, fever.   History provided by patient and family.    Past Medical History:  Diagnosis Date   Abdominal mass    Arthritis    back   Chronic kidney disease    Colon polyp    Diabetes 1.5, managed as type 1 ([place])    Drug addiction ([place])    hx (pain pills after c-sect)   GERD (gastroesophageal reflux disease)    Hypertension    Pelvic floor dysfunction    Urine incontinence     Past Surgical History:  Procedure Laterality Date   ABDOMINAL HYSTERECTOMY  2005   CESAREAN SECTION  2003   COLONOSCOPY WITH PROPOFOL N/A 09/14/2015   Procedure: COLONOSCOPY WITH PROPOFOL;  Surgeon: Lucilla Lame, MD;  Location: [place];  Service: Endoscopy;  Laterality: N/A;  Diabetic - insulin   CYSTOSCOPY W/ RETROGRADES Right 07/06/2017   Procedure: CYSTOSCOPY WITH RETROGRADE PYELOGRAM;  Surgeon: Hollice Espy, MD;  Location: [facility];  Service: Urology;  Laterality: Right;   CYSTOSCOPY W/ URETERAL STENT PLACEMENT Right 06/06/2017   Procedure: CYSTOSCOPY WITH RETROGRADE PYELOGRAM/URETERAL STENT PLACEMENT;  Surgeon: Lucas Mallow, MD;  Location: [facility];  Service: Urology;  Laterality: Right;   CYSTOSCOPY W/  URETERAL STENT PLACEMENT Right 07/06/2017   Procedure: CYSTOSCOPY WITH STENT REPLACEMENT;  Surgeon: Hollice Espy, MD;  Location: [facility];  Service: Urology;  Laterality: Right;   ESOPHAGOGASTRODUODENOSCOPY (EGD) WITH PROPOFOL N/A 06/19/2016   Procedure: ESOPHAGOGASTRODUODENOSCOPY (EGD) WITH PROPOFOL;  Surgeon: Jonathon Bellows, MD;  Location: [facility];  Service: Endoscopy;  Laterality: N/A;   LAPAROSCOPY     LYSIS OF ADHESION     POLYPECTOMY  09/14/2015   Procedure: POLYPECTOMY;  Surgeon: Lucilla Lame, MD;  Location: [place];  Service: Endoscopy;;   RIGHT OOPHORECTOMY  2012   TEMPORARY DIALYSIS CATHETER N/A 10/01/2021   Procedure: TEMPORARY DIALYSIS CATHETER;  Surgeon: Algernon Huxley, MD;  Location: [place] [facility];  Service: Cardiovascular;  Laterality: N/A;   TONSILLECTOMY  1987   and adenoids   TUBAL LIGATION  2004   VULVECTOMY  2011   lipoma    MEDICATIONS:  Prior to Admission medications   Medication Sig Start Date End Date Taking? Authorizing Provider  Accu-Chek Softclix Lancets lancets Use to check blood sugar daily for type 2 diabetes. E11.9 06/27/21   Jearld Fenton, NP  amLODipine (NORVASC) 10 MG tablet Take 1 tablet (10 mg total) by mouth daily. 10/04/21   Fritzi Mandes, MD  aspirin EC 81 MG tablet Take 81 mg by mouth daily.    [provider]  gentamicin cream (GARAMYCIN) 0.1 % Apply 1  Application topically daily. 10/04/21   Fritzi Mandes, MD  glucose blood (ACCU-CHEK GUIDE) test strip 1 each by Other route See admin instructions. 07/08/21   Jearld Fenton, NP  labetalol (NORMODYNE) 100 MG tablet Take 1 tablet (100 mg total) by mouth 3 (three) times daily. 10/03/21   Fritzi Mandes, MD  levothyroxine (SYNTHROID) 25 MCG tablet Take 1 tablet (25 mcg total) by mouth daily. 08/08/21   Jearld Fenton, NP  methadone (DOLOPHINE) 10 MG/ML solution Take 120 mg by mouth daily. Changed by Dr Posey Pronto to get the right home dose w/o sending rx    [provider]  naloxone  [facility]) nasal spray 4 mg/0.1 mL One spray in either nostril once for known/suspected opioid overdose. May repeat every 2-3 minutes in alternating nostril til EMS arrives 04/29/21   [provider]  omeprazole (PRILOSEC) 20 MG capsule Take 1 capsule (20 mg total) by mouth daily. 08/07/21   Jearld Fenton, NP    Physical Exam   Triage Vital Signs: ED Triage Vitals  Enc Vitals Group     BP 10/13/21 2218 134/75     Pulse Rate 10/13/21 2218 73     Resp 10/13/21 2218 19     Temp 10/13/21 2218 97.9 F (36.6 C)     Temp Source 10/13/21 2218 Oral     SpO2 10/13/21 2218 99 %     Weight 10/13/21 2219 198 lb 6.6 oz (90 kg)     Height 10/13/21 2219 '5\' 7"'$  (1.702 m)     Head Circumference --      Peak Flow --      Pain Score 10/13/21 2218 5     Pain Loc --      Pain Edu? --      Excl. in [place]? --     Most recent vital signs: Vitals:   10/14/21 0200 10/14/21 0300  BP: 132/67 135/71  Pulse:    Resp: 15 14  Temp:    SpO2: 98% 98%    CONSTITUTIONAL: Alert and oriented and responds appropriately to questions. Well-appearing; well-nourished HEAD: Normocephalic, atraumatic EYES: Conjunctivae clear, pupils appear equal, sclera nonicteric ENT: normal nose; moist mucous membranes NECK: Supple, normal ROM CARD: RRR; S1 and S2 appreciated; no murmurs, no clicks, no rubs, no gallops RESP: Normal chest excursion without splinting or tachypnea; breath sounds clear and equal bilaterally; no wheezes, no rhonchi, no rales, no hypoxia or respiratory distress, speaking full sentences ABD/GI: Normal bowel sounds; slightly distended; soft, non-tender, no rebound, no guarding, no peritoneal signs BACK: The back appears normal EXT: Normal ROM in all joints; no deformity noted, mild nonpitting edema in bilateral upper and lower extremities, no calf tenderness or calf swelling, compartments are extremities warm and well-perfused SKIN: Normal color for age and race; warm; no rash on exposed skin NEURO:  Moves all extremities equally, normal speech, no facial asymmetry PSYCH: The patient's mood and manner are appropriate.   ED Results / Procedures / Treatments   LABS: (all labs ordered are listed, but only abnormal results are displayed) Labs Reviewed  CBC - Abnormal; Notable for the following components:      Result Value   RBC 2.53 (*)    Hemoglobin 7.3 (*)    HCT 23.4 (*)    All other components within normal limits  COMPREHENSIVE METABOLIC PANEL - Abnormal; Notable for the following components:   Sodium 134 (*)    Potassium 5.6 (*)    CO2 18 (*)  BUN 76 (*)    Creatinine, Ser 15.85 (*)    Calcium 8.4 (*)    Total Protein 6.1 (*)    Albumin 2.9 (*)    AST 13 (*)    GFR, Estimated 3 (*)    All other components within normal limits  BRAIN NATRIURETIC PEPTIDE - Abnormal; Notable for the following components:   B Natriuretic Peptide 995.8 (*)    All other components within normal limits  TROPONIN I (HIGH SENSITIVITY) - Abnormal; Notable for the following components:   Troponin I (High Sensitivity) 114 (*)    All other components within normal limits  TROPONIN I (HIGH SENSITIVITY) - Abnormal; Notable for the following components:   Troponin I (High Sensitivity) 99 (*)    All other components within normal limits  MAGNESIUM  URINALYSIS, ROUTINE W REFLEX MICROSCOPIC     EKG:  EKG Interpretation  Date/Time:  Sunday October 13 2021 22:22:33 EDT Ventricular Rate:  71 PR Interval:  152 QRS Duration: 94 QT Interval:  454 QTC Calculation: 493 R Axis:   64 Text Interpretation: Normal sinus rhythm Cannot rule out Anterior infarct , age undetermined Abnormal ECG When compared with ECG of 30-Sep-2021 11:30, Nonspecific T wave abnormality no longer evident in Inferior leads T wave inversion no longer evident in Lateral leads QT has lengthened Confirmed by Pryor Curia [phone]) on 10/13/2021 11:39:46 PM         EKG Interpretation  Date/Time:  Monday October 14 2021 01:55:26  EDT Ventricular Rate:  74 PR Interval:  156 QRS Duration: 101 QT Interval:  371 QTC Calculation: 412 R Axis:   71 Text Interpretation: Sinus rhythm Probable inferior infarct, old Lateral leads are also involved Confirmed by Pryor Curia [phone]) on 10/14/2021 1:58:04 AM           RADIOLOGY: My personal review and interpretation of imaging: Chest x-ray shows improving pulmonary edema compared to prior.  I have personally reviewed all radiology reports.   DG Chest Portable 1 View  Result Date: 10/14/2021 CLINICAL DATA:  Generalized edema.  Assess for pulmonary edema. EXAM: PORTABLE CHEST 1 VIEW COMPARISON:  Most recent radiograph 09/30/2021 FINDINGS: Borderline cardiomegaly, possibly accentuated by AP technique. There may be a small left pleural effusion. Improved interstitial opacities from prior exam. Streaky opacities at the left lung base. No pneumothorax. IMPRESSION: 1. Improved interstitial opacities from prior exam consistent with improving pulmonary edema. No definite residual. 2. Possible small left pleural effusion. Streaky opacities at the left lung base favor atelectasis. 3. Borderline cardiomegaly. Electronically Signed   By: Keith Rake M.D.   On: 10/14/2021 00:03     PROCEDURES:  Critical Care performed: Yes, see critical care procedure note(s)   CRITICAL CARE Performed by: Cyril Mourning Kazzandra Desaulniers   Total critical care time: 45 minutes  Critical care time was exclusive of separately billable procedures and treating other patients.  Critical care was necessary to treat or prevent imminent or life-threatening deterioration.  Critical care was time spent personally by me on the following activities: development of treatment plan with patient and/or surrogate as well as nursing, discussions with consultants, evaluation of patient's response to treatment, examination of patient, obtaining history from patient or surrogate, ordering and performing treatments and interventions,  ordering and review of laboratory studies, ordering and review of radiographic studies, pulse oximetry and re-evaluation of patient's condition.   Marland Kitchen1-3 Lead EKG Interpretation  Performed by: Mina Carlisi, Delice Bison, DO Authorized by: Almer Littleton, Delice Bison, DO     Interpretation: normal  ECG rate:  71   ECG rate assessment: normal     Rhythm: sinus rhythm     Ectopy: none     Conduction: normal       IMPRESSION / MDM / ASSESSMENT AND PLAN / ED COURSE  I reviewed the triage vital signs and the nursing notes.    Patient here with extremity spasms and edema.  On peritoneal dialysis.  She is worried about hyperkalemia.  The patient is on the cardiac monitor to evaluate for evidence of arrhythmia and/or significant heart rate changes.   DIFFERENTIAL DIAGNOSIS (includes but not limited to):   Volume overload, electrolyte derangement, MSK pain, doubt ACS, PE, dissection, CHF   Patient's presentation is most consistent with acute presentation with potential threat to life or bodily function.   PLAN: CBC, CMP, BNP, troponin obtained from triage.  Patient has chronic and stable anemia.  She denies any bloody stools, melena, vaginal bleeding.  She is hemodynamically stable.  No indication for emergent transfusion.  Potassium level here is 5.6.  She does have a slightly prolonged QT interval has had this previously with hyperkalemia.  She is on the cardiac monitor.  Patient states she still makes urine.  Will give Lasix, Lokelma.  We will hold IV fluids given she already appears volume overloaded.  She denies any chest pain or shortness of breath today and her lungs are clear chills, hypoxia.  EKG shows no ischemia.  Her troponin is elevated at 114.  Repeat troponin pending.  I suspect it was hyperkalemia and volume overload is secondary to not being able to do peritoneal dialysis to completion over the weekend given she was running out of the   [facility] ED: Medications  furosemide  (LASIX) injection 40 mg (40 mg Intravenous Given 10/13/21 2343)  sodium zirconium cyclosilicate (LOKELMA) packet 10 g (10 g Oral Given 10/14/21 0000)  morphine (PF) 4 MG/ML injection 4 mg (4 mg Intravenous Given 10/13/21 2343)  ondansetron (ZOFRAN) injection 4 mg (4 mg Intravenous Given 10/13/21 2343)  morphine (PF) 4 MG/ML injection 4 mg (4 mg Intravenous Given 10/14/21 0233)     ED COURSE: Patient's repeat EKG shows that her QT interval has improved.  Her second troponin is downtrending and again likely secondary to not being dialyzed appropriately and chronic kidney disease.  She appears to have a chronically elevated troponin.  Again no chest pain or shortness of breath.  Patient states she has supplies coming to her in the morning so that she can do full peritoneal dialysis at that time.  I do not feel there is any indication to keep her in the hospital for emergent or urgent dialysis.  She is also comfortable with the plan for discharge home.    At this time, I do not feel there is any life-threatening condition present. I reviewed all nursing notes, vitals, pertinent previous records.  All lab and urine results, EKGs, imaging ordered have been independently reviewed and interpreted by myself.  I reviewed all available radiology reports from any imaging ordered this visit.  Based on my assessment, I feel the patient is safe to be discharged home without further emergent workup and can continue workup as an outpatient as needed. Discussed all findings, treatment plan as well as usual and customary return precautions.  They verbalize understanding and are comfortable with this plan.  Outpatient follow-up has been provided as needed.  All questions have been answered.    CONSULTS: Admission considered but patient given medications  for her hypokalemia and now prolonged QT interval has resolved.  She does have some peripheral edema on exam but no pulmonary edema, hypoxia or increased work of breathing and no  hypertension.  Patient feels comfortable with discharge home with further outpatient management and will be able to complete peritoneal dialysis in the morning.   OUTSIDE RECORDS REVIEWED: Reviewed patient's last admission on 09/30/2021.       FINAL CLINICAL IMPRESSION(S) / ED DIAGNOSES   Final diagnoses:  Hyperkalemia  Peripheral edema     Rx / DC Orders   ED Discharge Orders     None        Note:  This document was prepared using Dragon voice recognition software and may include unintentional dictation errors.   Lyric Hoar, Delice Bison, DO 10/14/21 [phone]

## 2021-10-14 LAB — TROPONIN I (HIGH SENSITIVITY): Troponin I (High Sensitivity): 99 ng/L — ABNORMAL HIGH (ref <18)

## 2021-10-14 MED ORDER — MORPHINE SULFATE (PF) 4 MG/ML IV SOLN
4.0000 mg | Freq: Once | INTRAVENOUS | Status: AC
  Administered 2021-10-14: 4 mg via INTRAVENOUS
  Filled 2021-10-14: qty 1

## 2021-10-14 NOTE — Discharge Instructions (Signed)
Recommend close follow-up with your nephrologist to see if they need to adjust any of your peritoneal dialysis given your concerns for intermittent extremity swelling and hyperkalemia.  I recommend making sure that you need your full peritoneal dialysis sessions over the next few days.  You will need to have your potassium rechecked by your PCP or your nephrologist in 3 days.

## 2021-10-14 NOTE — ED Notes (Signed)
Signing pad did not work. Pt verbalized understanding of dc instructions.

## 2021-10-21 ENCOUNTER — Encounter: Payer: Self-pay | Admitting: Internal Medicine

## 2021-10-22 ENCOUNTER — Encounter: Payer: Self-pay | Admitting: Internal Medicine

## 2021-10-24 ENCOUNTER — Telehealth: Payer: Self-pay | Admitting: Internal Medicine

## 2021-10-24 NOTE — Telephone Encounter (Signed)
Pt  mother is requesting a prescription for a wheel chair due to pt  swollen legs

## 2021-10-24 NOTE — Telephone Encounter (Signed)
This appears to be a duplicate request. Patient has already corresponded with Rollene Fare on [place] message on 8/14 regarding request for wheelchair.  Nobie Putnam, [facility] 10/24/2021, 4:17 PM

## 2021-10-25 NOTE — Telephone Encounter (Signed)
Again, she has to be seen. She constantly cancels her appts or no shows. She needs to keep upcoming appt.

## 2021-10-29 ENCOUNTER — Ambulatory Visit: Payer: Medicaid Other | Admitting: Internal Medicine

## 2021-10-29 ENCOUNTER — Encounter: Payer: Self-pay | Admitting: Internal Medicine

## 2021-10-29 VITALS — BP 127/60 | HR 80 | Temp 97.3°F | Wt 210.0 lb

## 2021-10-29 DIAGNOSIS — D631 Anemia in chronic kidney disease: Secondary | ICD-10-CM

## 2021-10-29 DIAGNOSIS — R269 Unspecified abnormalities of gait and mobility: Secondary | ICD-10-CM | POA: Diagnosis not present

## 2021-10-29 DIAGNOSIS — N19 Unspecified kidney failure: Secondary | ICD-10-CM | POA: Diagnosis not present

## 2021-10-29 DIAGNOSIS — N186 End stage renal disease: Secondary | ICD-10-CM | POA: Diagnosis not present

## 2021-10-29 DIAGNOSIS — N185 Chronic kidney disease, stage 5: Secondary | ICD-10-CM | POA: Diagnosis not present

## 2021-10-29 DIAGNOSIS — E875 Hyperkalemia: Secondary | ICD-10-CM

## 2021-10-29 MED ORDER — METOLAZONE 2.5 MG PO TABS
2.5000 mg | ORAL_TABLET | ORAL | 0 refills | Status: AC
Start: 2021-10-29 — End: ?

## 2021-10-29 NOTE — Progress Notes (Signed)
Subjective:    Patient ID: Nichole Wiley, female    DOB: 06/05/1980, 41 y.o.   MRN: [id number]  HPI  Patient presents to clinic today for hospital and ER follow-up.  She presented to the ER 7/24 with involuntary head movements.  She has a history of ESRD on PD, HTN, HLD, DM uncontrolled, anemia of CKD.  She was noncompliant with her peritoneal dialysis.  She was found to be hyperkalemic and anemic on presentation.  She was found to be uremic which was contributing to her involuntary muscle movements.  She was transfused 1 unit PRBC.  Neurology was consulted.  There was also concern for high doses of methadone use.  Vascular was consulted.  She had a temporary dialysis catheter placed and received hemodialysis.  This was eventually removed and she was advised to continue peritoneal dialysis per nephrology.  Her methadone was tapered prior to discharge on 7/27.  She subsequently presented back to the ER 8/6 with complaint of swelling.  She was found to be hyperkalemic on exam.  ECG did show some QT prolongation.  She was treated with Lasix and Lokelma.  She was discharged and advised to follow-up with her PCP and nephrologist.  Since that time, she reports persistent weight gain and generalized swelling. She has been having shortness of breath. She does continue to urinate. She denies fever, chills, nausea or vomiting. She has been doing her PD as prescribed.  She has a follow-up with nephrology next week.  Review of Systems     Past Medical History:  Diagnosis Date   Abdominal mass    Arthritis    back   Chronic kidney disease    Colon polyp    Diabetes 1.5, managed as type 1 ([place])    Drug addiction (HCC)    hx (pain pills after c-sect)   GERD (gastroesophageal reflux disease)    Hypertension    Pelvic floor dysfunction    Urine incontinence     Current Outpatient Medications  Medication Sig Dispense Refill   Accu-Chek Softclix Lancets lancets Use to check blood sugar daily for  type 2 diabetes. E11.9 100 each 4   amLODipine (NORVASC) 10 MG tablet Take 1 tablet (10 mg total) by mouth daily. 30 tablet 3   aspirin EC 81 MG tablet Take 81 mg by mouth daily.     gentamicin cream (GARAMYCIN) 0.1 % Apply 1 Application topically daily. 15 g 0   glucose blood (ACCU-CHEK GUIDE) test strip 1 each by Other route See admin instructions. 100 each 0   labetalol (NORMODYNE) 100 MG tablet Take 1 tablet (100 mg total) by mouth 3 (three) times daily. 90 tablet 3   levothyroxine (SYNTHROID) 25 MCG tablet Take 1 tablet (25 mcg total) by mouth daily. 90 tablet 0   methadone (DOLOPHINE) 10 MG/ML solution Take 120 mg by mouth daily. Changed by Dr Posey Pronto to get the right home dose w/o sending rx     naloxone [facility]) nasal spray 4 mg/0.1 mL One spray in either nostril once for known/suspected opioid overdose. May repeat every 2-3 minutes in alternating nostril til EMS arrives     omeprazole (PRILOSEC) 20 MG capsule Take 1 capsule (20 mg total) by mouth daily. 90 capsule 0   No current facility-administered medications for this visit.    Allergies  Allergen Reactions   Cephalexin Other (See Comments) and Rash    Unknown   Penicillins Rash    Has patient had a PCN reaction causing  immediate rash, facial/tongue/throat swelling, SOB or lightheadedness with hypotension: No Has patient had a PCN reaction causing severe rash involving mucus membranes or skin necrosis: No Has patient had a PCN reaction that required hospitalization: No Has patient had a PCN reaction occurring within the last 10 years: No If all of the above answers are "NO", then may proceed with Cephalosporin use.    Family History  Problem Relation Age of Onset   Depression Mother    Diabetes Father    Heart disease Maternal Grandmother    Depression Maternal Grandmother    Dementia Maternal Grandmother    Diabetes Maternal Grandmother    Diabetes Paternal Grandmother     Social History   Socioeconomic History    Marital status: Single    Spouse name: Not on file   Number of children: 2   Years of education: Not on file   Highest education level: Not on file  Occupational History   Not on file  Tobacco Use   Smoking status: Every Day    Packs/day: 0.50    Years: 24.00    Total pack years: 12.00    Types: Cigarettes   Smokeless tobacco: Never   Tobacco comments:    has cut down from 2 PPD  Vaping Use   Vaping Use: Never used  Substance and Sexual Activity   Alcohol use: No   Drug use: No   Sexual activity: Yes    Birth control/protection: Surgical  Other Topics Concern   Not on file  Social History Narrative   Not on file   Social Determinants of Health   Financial Resource Strain: Low Risk  (12/08/2018)   Overall Financial Resource Strain (CARDIA)    Difficulty of Paying Living Expenses: Not very hard  Food Insecurity: No Food Insecurity (12/08/2018)   Hunger Vital Sign    Worried About Running Out of Food in the Last Year: Never true    Ran Out of Food in the Last Year: Never true  Transportation Needs: No Transportation Needs (12/08/2018)   PRAPARE - Hydrologist (Medical): No    Lack of Transportation (Non-Medical): No  Physical Activity: Not on file  Stress: Not on file  Social Connections: Unknown (12/08/2018)   Social Connection and Isolation Panel [NHANES]    Frequency of Communication with Friends and Family: More than three times a week    Frequency of Social Gatherings with Friends and Family: Not on file    Attends Religious Services: Not on file    Active Member of Clubs or Organizations: Not on file    Attends Archivist Meetings: Not on file    Marital Status: Not on file  Intimate Partner Violence: Not At Risk (12/08/2018)   Humiliation, Afraid, Rape, and Kick questionnaire    Fear of Current or Ex-Partner: No    Emotionally Abused: No    Physically Abused: No    Sexually Abused: No     Constitutional: Denies fever,  malaise, fatigue, headache or abrupt weight changes.  HEENT: Denies eye pain, eye redness, ear pain, ringing in the ears, wax buildup, runny nose, nasal congestion, bloody nose, or sore throat. Respiratory: Pt reports shortness of breath. Denies difficulty breathing, cough or sputum production.   Cardiovascular: Pt reports swelling in lower extremities. Denies chest pain, chest tightness, palpitations.  Gastrointestinal: Denies abdominal pain, bloating, constipation, diarrhea or blood in the stool.  GU: Denies urgency, frequency, pain with urination, burning sensation, blood in  urine, odor or discharge. Musculoskeletal: Denies decrease in range of motion, difficulty with gait, muscle pain or joint pain and swelling.  Skin: Denies redness, rashes, lesions or ulcercations.  Neurological: Denies dizziness, difficulty with memory, difficulty with speech or problems with balance and coordination.    No other specific complaints in a complete review of systems (except as listed in HPI above).  Objective:   Physical Exam  BP 127/60 (BP Location: Right Arm, Patient Position: Sitting, Cuff Size: Normal)   Pulse 80   Temp (!) 97.3 F (36.3 C) (Temporal)   Wt 210 lb (95.3 kg)   SpO2 100%   BMI 32.89 kg/m   Wt Readings from Last 3 Encounters:  10/13/21 198 lb 6.6 oz (90 kg)  10/03/21 193 lb 9 oz (87.8 kg)  09/20/21 187 lb (84.8 kg)    General: Appears her stated age, chronically ill, in NAD. Skin: Warm, dry and intact.  Slightly on this.  No ulcerations noted. HEENT: Head: normal shape and size; Eyes: sclera white, no icterus, conjunctiva pink, PERRLA and EOMs intact;  Cardiovascular: Normal rate and rhythm. S1,S2 noted.  No murmur, rubs or gallops noted.  2+ pitting BLE edema.  Pulmonary/Chest: Normal effort and positive vesicular breath sounds. No respiratory distress. No wheezes, rales or ronchi noted.  Musculoskeletal: Gait slow but steady with use of rolling walker. Neurological: Alert  and oriented.   BMET    Component Value Date/Time   NA 134 (L) 10/13/2021 2222   NA 127 (L) 01/20/2012 1610   K 5.6 (H) 10/13/2021 2222   K 3.5 01/20/2012 1610   CL 104 10/13/2021 2222   CL 91 (L) 01/20/2012 1610   CO2 18 (L) 10/13/2021 2222   CO2 26 01/20/2012 1610   GLUCOSE 94 10/13/2021 2222   GLUCOSE 560 (HH) 01/20/2012 1610   BUN 76 (H) 10/13/2021 2222   BUN 10 01/20/2012 1610   CREATININE 15.85 (H) 10/13/2021 2222   CREATININE 15.56 (H) 09/20/2021 1445   CALCIUM 8.4 (L) 10/13/2021 2222   CALCIUM 8.7 01/20/2012 1610   GFRNONAA 3 (L) 10/13/2021 2222   GFRNONAA 71 09/28/2015 1524   GFRAA 35 (L) 11/23/2018 1719   GFRAA 82 09/28/2015 1524    Lipid Panel     Component Value Date/Time   CHOL 258 (H) 11/23/2018 1719   TRIG 113 11/23/2018 1719   HDL 55 11/23/2018 1719   CHOLHDL 4.7 11/23/2018 1719   VLDL 23 11/23/2018 1719   LDLCALC 180 (H) 11/23/2018 1719    CBC    Component Value Date/Time   WBC 6.6 10/13/2021 2222   RBC 2.53 (L) 10/13/2021 2222   HGB 7.3 (L) 10/13/2021 2222   HGB 12.8 01/20/2012 1610   HCT 23.4 (L) 10/13/2021 2222   HCT 36.8 01/20/2012 1610   PLT 218 10/13/2021 2222   PLT 95 (L) 01/20/2012 1610   MCV 92.5 10/13/2021 2222   MCV 89 01/20/2012 1610   MCH 28.9 10/13/2021 2222   MCHC 31.2 10/13/2021 2222   RDW 14.7 10/13/2021 2222   RDW 12.6 01/20/2012 1610   LYMPHSABS 1.5 09/30/2021 0953   MONOABS 0.4 09/30/2021 0953   EOSABS 0.6 (H) 09/30/2021 0953   BASOSABS 0.1 09/30/2021 0953    Hgb A1C Lab Results  Component Value Date   HGBA1C 4.8 10/01/2021            Assessment & Plan:   Hospital Follow-Up for Uremia, Hyperkalemia, Anemia of CKD secondary to ESRD on PD:  Hospital/ER notes,  labs and imaging reviewed We will check CBC and c-Met today D/C Torsemide Rx for Metolazone 2.5 mg every other day Monitor weights daily and notify me if greater than 3 pound weight gain in a day Follow-up with nephrology as previously  scheduled  RTC in 3 months for follow-up of chronic conditions Webb Silversmith, NP

## 2021-10-29 NOTE — Patient Instructions (Signed)
Edema ? ?Edema is when you have too much fluid in your body or under your skin. Edema may make your legs, feet, and ankles swell. Swelling often happens in looser tissues, such as around your eyes. This is a common condition. It gets more common as you get older. ?There are many possible causes of edema. These include: ?Eating too much salt (sodium). ?Being on your feet or sitting for a long time. ?Certain medical conditions, such as: ?Pregnancy. ?Heart failure. ?Liver disease. ?Kidney disease. ?Cancer. ?Hot weather may make edema worse. Edema is usually painless. Your skin may look swollen or shiny. ?Follow these instructions at home: ?Medicines ?Take over-the-counter and prescription medicines only as told by your doctor. ?Your doctor may prescribe a medicine to help your body get rid of extra water (diuretic). Take this medicine if you are told to take it. ?Eating and drinking ?Eat a low-salt (low-sodium) diet as told by your doctor. Sometimes, eating less salt may reduce swelling. ?Depending on the cause of your swelling, you may need to limit how much fluid you drink (fluid restriction). ?General instructions ?Raise the injured area above the level of your heart while you are sitting or lying down. ?Do not sit still or stand for a long time. ?Do not wear tight clothes. Do not wear garters on your upper legs. ?Exercise your legs. This can help the swelling go down. ?Wear compression stockings as told by your doctor. It is important that these are the right size. These should be prescribed by your doctor to prevent possible injuries. ?If elastic bandages or wraps are recommended, use them as told by your doctor. ?Contact a doctor if: ?Treatment is not working. ?You have heart, liver, or kidney disease and have symptoms of edema. ?You have sudden and unexplained weight gain. ?Get help right away if: ?You have shortness of breath or chest pain. ?You cannot breathe when you lie down. ?You have pain, redness, or  warmth in the swollen areas. ?You have heart, liver, or kidney disease and get edema all of a sudden. ?You have a fever and your symptoms get worse all of a sudden. ?These symptoms may be an emergency. Get help right away. Call 911. ?Do not wait to see if the symptoms will go away. ?Do not drive yourself to the hospital. ?Summary ?Edema is when you have too much fluid in your body or under your skin. ?Edema may make your legs, feet, and ankles swell. Swelling often happens in looser tissues, such as around your eyes. ?Raise the injured area above the level of your heart while you are sitting or lying down. ?Follow your doctor's instructions about diet and how much fluid you can drink. ?This information is not intended to replace advice given to you by your health care provider. Make sure you discuss any questions you have with your health care provider. ?Document Revised: 10/29/2020 Document Reviewed: 10/29/2020 ?Elsevier Patient Education ? 2023 Elsevier Inc. ? ?

## 2021-10-30 ENCOUNTER — Emergency Department
Admission: EM | Admit: 2021-10-30 | Discharge: 2021-10-31 | Disposition: A | Discharge status: Home / Self Care | Payer: Medicaid Other | Attending: Student in an Organized Health Care Education/Training Program | Admitting: Student in an Organized Health Care Education/Training Program

## 2021-10-30 ENCOUNTER — Emergency Department: Payer: Medicaid Other

## 2021-10-30 ENCOUNTER — Telehealth: Payer: Self-pay

## 2021-10-30 ENCOUNTER — Other Ambulatory Visit: Payer: Self-pay

## 2021-10-30 DIAGNOSIS — D649 Anemia, unspecified: Secondary | ICD-10-CM | POA: Diagnosis present

## 2021-10-30 DIAGNOSIS — N186 End stage renal disease: Secondary | ICD-10-CM | POA: Diagnosis not present

## 2021-10-30 DIAGNOSIS — Z992 Dependence on renal dialysis: Secondary | ICD-10-CM | POA: Diagnosis not present

## 2021-10-30 DIAGNOSIS — E875 Hyperkalemia: Secondary | ICD-10-CM | POA: Diagnosis not present

## 2021-10-30 LAB — COMPLETE METABOLIC PANEL WITH GFR
AG Ratio: 1.2 (calc) (ref 1.0–2.5)
ALT: 11 U/L (ref 6–29)
AST: 6 U/L — ABNORMAL LOW (ref 10–30)
Albumin: 3.1 g/dL — ABNORMAL LOW (ref 3.6–5.1)
Alkaline phosphatase (APISO): 101 U/L (ref 31–125)
BUN/Creatinine Ratio: 5 (calc) — ABNORMAL LOW (ref 6–22)
BUN: 84 mg/dL — ABNORMAL HIGH (ref 7–25)
CO2: 20 mmol/L (ref 20–32)
Calcium: 8.6 mg/dL (ref 8.6–10.2)
Chloride: 102 mmol/L (ref 98–110)
Creat: 17.25 mg/dL — ABNORMAL HIGH (ref 0.50–0.99)
Globulin: 2.6 g/dL (calc) (ref 1.9–3.7)
Glucose, Bld: 77 mg/dL (ref 65–99)
Potassium: 6 mmol/L — ABNORMAL HIGH (ref 3.5–5.3)
Sodium: 137 mmol/L (ref 135–146)
Total Bilirubin: 0.2 mg/dL (ref 0.2–1.2)
Total Protein: 5.7 g/dL — ABNORMAL LOW (ref 6.1–8.1)
eGFR: 2 mL/min/{1.73_m2} — ABNORMAL LOW (ref >=60)

## 2021-10-30 LAB — CBC WITH DIFFERENTIAL/PLATELET
Abs Immature Granulocytes: 0.03 10*3/uL (ref 0.00–0.07)
Basophils Absolute: 0.1 10*3/uL (ref 0.0–0.1)
Basophils Relative: 1 %
Eosinophils Absolute: 0.5 10*3/uL (ref 0.0–0.5)
Eosinophils Relative: 6 %
HCT: 20.6 % — ABNORMAL LOW (ref 36.0–46.0)
Hemoglobin: 6.4 g/dL — ABNORMAL LOW (ref 12.0–15.0)
Immature Granulocytes: 0 %
Lymphocytes Relative: 12 %
Lymphs Abs: 0.9 10*3/uL (ref 0.7–4.0)
MCH: 29.1 pg (ref 26.0–34.0)
MCHC: 31.1 g/dL (ref 30.0–36.0)
MCV: 93.6 fL (ref 80.0–100.0)
Monocytes Absolute: 0.5 10*3/uL (ref 0.1–1.0)
Monocytes Relative: 6 %
Neutro Abs: 5.8 10*3/uL (ref 1.7–7.7)
Neutrophils Relative %: 75 %
Platelets: 213 10*3/uL (ref 150–400)
RBC: 2.2 MIL/uL — ABNORMAL LOW (ref 3.87–5.11)
RDW: 14.7 % (ref 11.5–15.5)
WBC: 7.8 10*3/uL (ref 4.0–10.5)
nRBC: 0 % (ref 0.0–0.2)

## 2021-10-30 LAB — CBC
HCT: 19.3 % — ABNORMAL LOW (ref 35.0–45.0)
Hemoglobin: 6.4 g/dL — ABNORMAL LOW (ref 11.7–15.5)
MCH: 30 pg (ref 27.0–33.0)
MCHC: 33.2 g/dL (ref 32.0–36.0)
MCV: 90.6 fL (ref 80.0–100.0)
MPV: 10.8 fL (ref 7.5–12.5)
Platelets: 201 10*3/uL (ref 140–400)
RBC: 2.13 10*6/uL — ABNORMAL LOW (ref 3.80–5.10)
RDW: 14.1 % (ref 11.0–15.0)
WBC: 8.6 10*3/uL (ref 3.8–10.8)

## 2021-10-30 LAB — COMPREHENSIVE METABOLIC PANEL
ALT: 13 U/L (ref 0–44)
AST: 11 U/L — ABNORMAL LOW (ref 15–41)
Albumin: 2.8 g/dL — ABNORMAL LOW (ref 3.5–5.0)
Alkaline Phosphatase: 92 U/L (ref 38–126)
Anion gap: 12 (ref 5–15)
BUN: 83 mg/dL — ABNORMAL HIGH (ref 6–20)
CO2: 22 mmol/L (ref 22–32)
Calcium: 8.5 mg/dL — ABNORMAL LOW (ref 8.9–10.3)
Chloride: 103 mmol/L (ref 98–111)
Creatinine, Ser: 16.31 mg/dL — ABNORMAL HIGH (ref 0.44–1.00)
GFR, Estimated: 3 mL/min — ABNORMAL LOW (ref >60)
Glucose, Bld: 89 mg/dL (ref 70–99)
Potassium: 5.9 mmol/L — ABNORMAL HIGH (ref 3.5–5.1)
Sodium: 137 mmol/L (ref 135–145)
Total Bilirubin: 0.8 mg/dL (ref 0.3–1.2)
Total Protein: 6.3 g/dL — ABNORMAL LOW (ref 6.5–8.1)

## 2021-10-30 LAB — TROPONIN I (HIGH SENSITIVITY): Troponin I (High Sensitivity): 46 ng/L — ABNORMAL HIGH (ref <18)

## 2021-10-30 LAB — PREPARE RBC (CROSSMATCH)

## 2021-10-30 MED ORDER — SODIUM CHLORIDE 0.9 % IV SOLN
10.0000 mL/h | Freq: Once | INTRAVENOUS | Status: AC
  Administered 2021-10-30: 10 mL/h via INTRAVENOUS

## 2021-10-30 MED ORDER — FUROSEMIDE 10 MG/ML IJ SOLN
120.0000 mg | Freq: Once | INTRAVENOUS | Status: AC
  Administered 2021-10-30: 120 mg via INTRAVENOUS
  Filled 2021-10-30: qty 10

## 2021-10-30 MED ORDER — FUROSEMIDE 10 MG/ML IJ SOLN
60.0000 mg | Freq: Once | INTRAMUSCULAR | Status: AC
Start: 2021-10-30 — End: 2021-10-30
  Administered 2021-10-30: 60 mg via INTRAVENOUS
  Filled 2021-10-30: qty 8

## 2021-10-30 MED ORDER — SODIUM ZIRCONIUM CYCLOSILICATE 10 G PO PACK
10.0000 g | PACK | Freq: Once | ORAL | Status: AC
  Administered 2021-10-30: 10 g via ORAL
  Filled 2021-10-30: qty 1

## 2021-10-30 NOTE — ED Provider Notes (Signed)
[facility] Provider Note    Event Date/Time   First MD Initiated Contact with Patient 10/30/21 1710     (approximate)   History   Low hgb  HPI  Nichole Wiley is a 41 y.o. female ESRD on PD with chronic anemia presents from nephrology clinic due to symptomatic anemia with hemoglobin 6.4.  She denying blood thinners.  Has been extensively evaluated for anemia in the past.  No history of GI bleeding.  She not on anticoagulation.  She denies any pain.  Does feel like she is retaining more fluid than normal her legs are more swollen but has been compliant with her dialysis.     Physical Exam   Triage Vital Signs: ED Triage Vitals  Enc Vitals Group     BP 10/30/21 1539 131/71     Pulse Rate 10/30/21 1539 79     Resp 10/30/21 1539 12     Temp 10/30/21 1539 98.7 F (37.1 C)     Temp Source 10/30/21 1720 Oral     SpO2 10/30/21 1539 94 %     Weight 10/30/21 1540 210 lb (95.3 kg)     Height --      Head Circumference --      Peak Flow --      Pain Score 10/30/21 1540 5     Pain Loc --      Pain Edu? --      Excl. in [place]? --     Most recent vital signs: Vitals:   10/30/21 2130 10/30/21 2200  BP: (!) 141/80 135/82  Pulse: 80 77  Resp:  18  Temp:    SpO2: 99% 100%     Constitutional: Alert  Eyes: Conjunctivae are normal.  Head: Atraumatic. Nose: No congestion/rhinnorhea. Mouth/Throat: Mucous membranes are moist.   Neck: Painless ROM.  Cardiovascular:   Good peripheral circulation. Respiratory: Normal respiratory effort.  No retractions.  Gastrointestinal: Soft and nontender.  Musculoskeletal:  no deformity. 3+ BLE Neurologic:  MAE spontaneously. No gross focal neurologic deficits are appreciated.  Skin:  Skin is warm, dry and intact. No rash noted. Psychiatric: Mood and affect are normal. Speech and behavior are normal.    ED Results / Procedures / Treatments   Labs (all labs ordered are listed, but only abnormal results are  displayed) Labs Reviewed  CBC WITH DIFFERENTIAL/PLATELET - Abnormal; Notable for the following components:      Result Value   RBC 2.20 (*)    Hemoglobin 6.4 (*)    HCT 20.6 (*)    All other components within normal limits  COMPREHENSIVE METABOLIC PANEL - Abnormal; Notable for the following components:   Potassium 5.9 (*)    BUN 83 (*)    Creatinine, Ser 16.31 (*)    Calcium 8.5 (*)    Total Protein 6.3 (*)    Albumin 2.8 (*)    AST 11 (*)    GFR, Estimated 3 (*)    All other components within normal limits  TROPONIN I (HIGH SENSITIVITY) - Abnormal; Notable for the following components:   Troponin I (High Sensitivity) 46 (*)    All other components within normal limits  PREPARE RBC (CROSSMATCH)  TYPE AND SCREEN     EKG  ED ECG REPORT I, Merlyn Lot, the attending physician, personally viewed and interpreted this ECG.   Date: 10/30/2021  EKG Time: 15:49  Rate: 80  Rhythm: sinus  Axis: normal  Intervals: normal  ST&T Change: no  stemi, no depressions    RADIOLOGY Please see ED Course for my review and interpretation.  I personally reviewed all radiographic images ordered to evaluate for the above acute complaints and reviewed radiology reports and findings.  These findings were personally discussed with the patient.  Please see medical record for radiology report.    PROCEDURES:  Critical Care performed: No  Procedures   MEDICATIONS ORDERED IN ED: Medications  furosemide (LASIX) 120 mg in dextrose 5 % 50 mL IVPB (120 mg Intravenous New Bag/Given 10/30/21 2254)  0.9 %  sodium chloride infusion (0 mL/hr Intravenous Stopped 10/30/21 2122)  sodium zirconium cyclosilicate (LOKELMA) packet 10 g (10 g Oral Given 10/30/21 1754)  furosemide (LASIX) 120 mg in dextrose 5 % 50 mL IVPB (0 mg Intravenous Stopped 10/30/21 1839)  furosemide (LASIX) injection 60 mg (60 mg Intravenous Given 10/30/21 2218)     IMPRESSION / MDM / ASSESSMENT AND PLAN / ED COURSE  I reviewed  the triage vital signs and the nursing notes.                              Differential diagnosis includes, but is not limited to, anemia of chronic disease, acute anemia, symptomatic anemia, ESRD, electrolyte abnormality, CHF  Patient presented to the ER for evaluation of symptoms as described above.  This presenting complaint could reflect a potentially life-threatening illness therefore the patient will be placed on continuous pulse oximetry and telemetry for monitoring.  Laboratory evaluation will be sent to evaluate for the above complaints.  Given her complex past medical history however she appears fairly well clinically and stable.  Will consult nephrology further recommendations.   Clinical Course as of 10/30/21 2255  Wed Oct 30, 2021  1832 Discussed the patient in consultation with [name] of nephrology.  Her edema is chronic.  He has recommended transfusion of 1 unit PRBCs with a dose of 120 mg IV Lasix Lokelma for her hyperkalemia and reassessment as she feels well they will see closely and outpatient follow-up. [PR]  2150 Feels improved.  Will observe for repeat dose of Lasix per Dr. Keturah Barre recommendation still be ordered for 6 hours after the initial dose after which point patient will be appropriate for outpatient follow-up. [PR]    Clinical Course User Index [PR] Merlyn Lot, MD   She will be signed out to oncoming physician pending observation for repeat dose of Lasix and then discharge home.   FINAL CLINICAL IMPRESSION(S) / ED DIAGNOSES   Final diagnoses:  Symptomatic anemia  ESRD (end stage renal disease) on dialysis ([employer])  Hyperkalemia     Rx / DC Orders   ED Discharge Orders     None        Note:  This document was prepared using Dragon voice recognition software and may include unintentional dictation errors.    Merlyn Lot, MD 10/30/21 2255

## 2021-10-30 NOTE — ED Notes (Signed)
Pt signed blood consent all questions answered

## 2021-10-30 NOTE — Telephone Encounter (Signed)
FYI.... Pt advised and she says she is going to the ER.   Thanks,   -Mickel Baas

## 2021-10-30 NOTE — ED Triage Notes (Signed)
PT coming POV from home after getting a call from her DR about her low Hemoglobin levels from recent blood work.

## 2021-10-30 NOTE — Telephone Encounter (Signed)
Linda with [employer] reports pt. Has abnormal lab results, results are in [place].

## 2021-10-30 NOTE — Discharge Instructions (Signed)
Follow up with [name].

## 2021-10-31 LAB — TYPE AND SCREEN
ABO/RH(D): A POS
Antibody Screen: NEGATIVE
Unit division: 0

## 2021-10-31 LAB — BPAM RBC
Blood Product Expiration Date: 202309132359
ISSUE DATE / TIME: 202308231831
Unit Type and Rh: 6200

## 2021-11-04 ENCOUNTER — Ambulatory Visit
Admission: RE | Admit: 2021-11-04 | Discharge: 2021-11-04 | Disposition: A | Discharge status: Home / Self Care | Payer: Medicaid Other | Source: Ambulatory Visit | Attending: Nephrology | Admitting: Nephrology

## 2021-11-04 ENCOUNTER — Ambulatory Visit: Payer: Self-pay | Admitting: Psychiatry

## 2021-11-04 ENCOUNTER — Other Ambulatory Visit: Payer: Self-pay | Admitting: Nephrology

## 2021-11-04 ENCOUNTER — Encounter: Payer: Self-pay | Admitting: Internal Medicine

## 2021-11-04 ENCOUNTER — Ambulatory Visit: Payer: No Typology Code available for payment source | Admitting: Psychiatry

## 2021-11-04 ENCOUNTER — Ambulatory Visit: Payer: Medicaid Other | Admitting: Internal Medicine

## 2021-11-04 ENCOUNTER — Ambulatory Visit
Admission: RE | Admit: 2021-11-04 | Discharge: 2021-11-04 | Disposition: A | Discharge status: Home / Self Care | Payer: Medicaid Other | Source: Home / Self Care | Attending: Nephrology | Admitting: Nephrology

## 2021-11-04 DIAGNOSIS — N186 End stage renal disease: Secondary | ICD-10-CM | POA: Diagnosis present

## 2021-11-04 DIAGNOSIS — Z992 Dependence on renal dialysis: Secondary | ICD-10-CM | POA: Diagnosis present

## 2021-11-04 NOTE — Progress Notes (Deleted)
Subjective:    Patient ID: Nichole Wiley, female    DOB: 11-19-1980, 41 y.o.   MRN: [id number]  HPI  Pt presents to the clinic today for a mobility consult. She has recently been hospitalized with severe complications from ESRD. She has significant shift in weight secondary to fluid weight gain which is impairing her mobility. She is currently using a rolling walker for assistance. She is requesting a motorized wheelchair and plans on using [facility].   Review of Systems     Past Medical History:  Diagnosis Date   Abdominal mass    Arthritis    back   Chronic kidney disease    Colon polyp    Diabetes 1.5, managed as type 1 ([place])    Drug addiction (HCC)    hx (pain pills after c-sect)   GERD (gastroesophageal reflux disease)    Hypertension    Pelvic floor dysfunction    Urine incontinence     Current Outpatient Medications  Medication Sig Dispense Refill   Accu-Chek Softclix Lancets lancets Use to check blood sugar daily for type 2 diabetes. E11.9 100 each 4   amLODipine (NORVASC) 10 MG tablet Take 1 tablet (10 mg total) by mouth daily. 30 tablet 3   aspirin EC 81 MG tablet Take 81 mg by mouth daily.     gentamicin cream (GARAMYCIN) 0.1 % Apply 1 Application topically daily. 15 g 0   glucose blood (ACCU-CHEK GUIDE) test strip 1 each by Other route See admin instructions. 100 each 0   labetalol (NORMODYNE) 100 MG tablet Take 1 tablet (100 mg total) by mouth 3 (three) times daily. 90 tablet 3   levothyroxine (SYNTHROID) 25 MCG tablet Take 1 tablet (25 mcg total) by mouth daily. 90 tablet 0   methadone (DOLOPHINE) 10 MG/ML solution Take 120 mg by mouth daily. Changed by Dr Posey Pronto to get the right home dose w/o sending rx     metolazone (ZAROXOLYN) 2.5 MG tablet Take 1 tablet (2.5 mg total) by mouth every other day. 90 tablet 0   naloxone (NARCAN) nasal spray 4 mg/0.1 mL One spray in either nostril once for known/suspected opioid overdose. May repeat every 2-3  minutes in alternating nostril til EMS arrives     omeprazole (PRILOSEC) 20 MG capsule Take 1 capsule (20 mg total) by mouth daily. 90 capsule 0   No current facility-administered medications for this visit.    Allergies  Allergen Reactions   Cephalexin Other (See Comments) and Rash    Unknown   Penicillins Rash    Has patient had a PCN reaction causing immediate rash, facial/tongue/throat swelling, SOB or lightheadedness with hypotension: No Has patient had a PCN reaction causing severe rash involving mucus membranes or skin necrosis: No Has patient had a PCN reaction that required hospitalization: No Has patient had a PCN reaction occurring within the last 10 years: No If all of the above answers are "NO", then may proceed with Cephalosporin use.    Family History  Problem Relation Age of Onset   Depression Mother    Diabetes Father    Heart disease Maternal Grandmother    Depression Maternal Grandmother    Dementia Maternal Grandmother    Diabetes Maternal Grandmother    Diabetes Paternal Grandmother     Social History   Socioeconomic History   Marital status: Single    Spouse name: Not on file   Number of children: 2   Years of education: Not on file  Highest education level: Not on file  Occupational History   Not on file  Tobacco Use   Smoking status: Every Day    Packs/day: 0.50    Years: 24.00    Total pack years: 12.00    Types: Cigarettes   Smokeless tobacco: Never   Tobacco comments:    has cut down from 2 PPD  Vaping Use   Vaping Use: Never used  Substance and Sexual Activity   Alcohol use: No   Drug use: No   Sexual activity: Yes    Birth control/protection: Surgical  Other Topics Concern   Not on file  Social History Narrative   Not on file   Social Determinants of Health   Financial Resource Strain: Low Risk  (12/08/2018)   Overall Financial Resource Strain (CARDIA)    Difficulty of Paying Living Expenses: Not very hard  Food Insecurity:  No Food Insecurity (12/08/2018)   Hunger Vital Sign    Worried About Running Out of Food in the Last Year: Never true    Ran Out of Food in the Last Year: Never true  Transportation Needs: No Transportation Needs (12/08/2018)   PRAPARE - Hydrologist (Medical): No    Lack of Transportation (Non-Medical): No  Physical Activity: Not on file  Stress: Not on file  Social Connections: Unknown (12/08/2018)   Social Connection and Isolation Panel [NHANES]    Frequency of Communication with Friends and Family: More than three times a week    Frequency of Social Gatherings with Friends and Family: Not on file    Attends Religious Services: Not on file    Active Member of Clubs or Organizations: Not on file    Attends Archivist Meetings: Not on file    Marital Status: Not on file  Intimate Partner Violence: Not At Risk (12/08/2018)   Humiliation, Afraid, Rape, and Kick questionnaire    Fear of Current or Ex-Partner: No    Emotionally Abused: No    Physically Abused: No    Sexually Abused: No     Constitutional: Denies fever, malaise, fatigue, headache or abrupt weight changes.  HEENT: Denies eye pain, eye redness, ear pain, ringing in the ears, wax buildup, runny nose, nasal congestion, bloody nose, or sore throat. Respiratory: Denies difficulty breathing, shortness of breath, cough or sputum production.   Cardiovascular: Pt reports swelling in legs. Denies chest pain, chest tightness, palpitations or swelling in the hands.  Gastrointestinal: Denies abdominal pain, bloating, constipation, diarrhea or blood in the stool.  GU: Denies urgency, frequency, pain with urination, burning sensation, blood in urine, odor or discharge. Musculoskeletal: Pt reports difficulty with gait. Denies decrease in range of motion, muscle pain or joint pain and swelling.  Skin: Denies redness, rashes, lesions or ulcercations.  Neurological: Denies dizziness, difficulty with  memory, difficulty with speech or problems with balance and coordination.  Psych: Denies anxiety, depression, SI/HI.  No other specific complaints in a complete review of systems (except as listed in HPI above).  Objective:   Physical Exam   There were no vitals taken for this visit. Wt Readings from Last 3 Encounters:  10/30/21 210 lb (95.3 kg)  10/29/21 210 lb (95.3 kg)  10/13/21 198 lb 6.6 oz (90 kg)    General: Appears their stated age, well developed, well nourished in NAD. Skin: Warm, dry and intact. No rashes, lesions or ulcerations noted. HEENT: Head: normal shape and size; Eyes: sclera white, no icterus, conjunctiva pink, PERRLA  and EOMs intact; Ears: Tm's gray and intact, normal light reflex; Nose: mucosa pink and moist, septum midline; Throat/Mouth: Teeth present, mucosa pink and moist, no exudate, lesions or ulcerations noted.  Neck:  Neck supple, trachea midline. No masses, lumps or thyromegaly present.  Cardiovascular: Normal rate and rhythm. S1,S2 noted.  No murmur, rubs or gallops noted. No JVD or BLE edema. No carotid bruits noted. Pulmonary/Chest: Normal effort and positive vesicular breath sounds. No respiratory distress. No wheezes, rales or ronchi noted.  Abdomen: Soft and nontender. Normal bowel sounds. No distention or masses noted. Liver, spleen and kidneys non palpable. Musculoskeletal: Normal range of motion. No signs of joint swelling. No difficulty with gait.  Neurological: Alert and oriented. Cranial nerves II-XII grossly intact. Coordination normal.  Psychiatric: Mood and affect normal. Behavior is normal. Judgment and thought content normal.    BMET    Component Value Date/Time   NA 137 10/30/2021 1553   NA 127 (L) 01/20/2012 1610   K 5.9 (H) 10/30/2021 1553   K 3.5 01/20/2012 1610   CL 103 10/30/2021 1553   CL 91 (L) 01/20/2012 1610   CO2 22 10/30/2021 1553   CO2 26 01/20/2012 1610   GLUCOSE 89 10/30/2021 1553   GLUCOSE 560 (HH) 01/20/2012 1610    BUN 83 (H) 10/30/2021 1553   BUN 10 01/20/2012 1610   CREATININE 16.31 (H) 10/30/2021 1553   CREATININE 17.25 (H) 10/29/2021 1547   CALCIUM 8.5 (L) 10/30/2021 1553   CALCIUM 8.7 01/20/2012 1610   GFRNONAA 3 (L) 10/30/2021 1553   GFRNONAA 71 09/28/2015 1524   GFRAA 35 (L) 11/23/2018 1719   GFRAA 82 09/28/2015 1524    Lipid Panel     Component Value Date/Time   CHOL 258 (H) 11/23/2018 1719   TRIG 113 11/23/2018 1719   HDL 55 11/23/2018 1719   CHOLHDL 4.7 11/23/2018 1719   VLDL 23 11/23/2018 1719   LDLCALC 180 (H) 11/23/2018 1719    CBC    Component Value Date/Time   WBC 7.8 10/30/2021 1553   RBC 2.20 (L) 10/30/2021 1553   HGB 6.4 (L) 10/30/2021 1553   HGB 12.8 01/20/2012 1610   HCT 20.6 (L) 10/30/2021 1553   HCT 36.8 01/20/2012 1610   PLT 213 10/30/2021 1553   PLT 95 (L) 01/20/2012 1610   MCV 93.6 10/30/2021 1553   MCV 89 01/20/2012 1610   MCH 29.1 10/30/2021 1553   MCHC 31.1 10/30/2021 1553   RDW 14.7 10/30/2021 1553   RDW 12.6 01/20/2012 1610   LYMPHSABS 0.9 10/30/2021 1553   MONOABS 0.5 10/30/2021 1553   EOSABS 0.5 10/30/2021 1553   BASOSABS 0.1 10/30/2021 1553    Hgb A1C Lab Results  Component Value Date   HGBA1C 4.8 10/01/2021           Assessment & Plan:   Encounter for Mobility Assessment, Peripheral Edema, Generalized Weakness secondary to Anemia of CKD, ESRD on PD:  Forms will be faxed back along with copy of this note to [facility]  RTC in 3 months, follow-up chronic conditions Webb Silversmith, NP

## 2021-11-04 NOTE — Progress Notes (Signed)
Xray abdomen ordered

## 2021-11-08 DIAGNOSIS — I5022 Chronic systolic (congestive) heart failure: Secondary | ICD-10-CM

## 2021-11-08 DIAGNOSIS — I2510 Atherosclerotic heart disease of native coronary artery without angina pectoris: Secondary | ICD-10-CM

## 2021-11-08 HISTORY — DX: Chronic systolic (congestive) heart failure: I50.22

## 2021-11-08 HISTORY — DX: Atherosclerotic heart disease of native coronary artery without angina pectoris: I25.10

## 2021-11-14 ENCOUNTER — Ambulatory Visit: Payer: Medicaid Other | Admitting: Internal Medicine

## 2021-11-14 NOTE — Progress Notes (Deleted)
Subjective:    Patient ID: Nichole Wiley, female    DOB: 1980-04-03, 41 y.o.   MRN: [id number]  HPI  Patient presents to clinic today requesting a mobility assessment and form completion.  She has had multiple recent hospitalizations secondary to complications of noncompliance with her peritoneal dialysis secondary to end-stage renal disease.  She has had a significant fluid weight gain which is impairing her ability to ambulate and perform ADLs.  She is currently using a rolling walker.  Review of Systems     Past Medical History:  Diagnosis Date   Abdominal mass    Arthritis    back   Chronic kidney disease    Colon polyp    Diabetes 1.5, managed as type 1 ([place])    Drug addiction (HCC)    hx (pain pills after c-sect)   GERD (gastroesophageal reflux disease)    Hypertension    Pelvic floor dysfunction    Urine incontinence     Current Outpatient Medications  Medication Sig Dispense Refill   Accu-Chek Softclix Lancets lancets Use to check blood sugar daily for type 2 diabetes. E11.9 100 each 4   amLODipine (NORVASC) 10 MG tablet Take 1 tablet (10 mg total) by mouth daily. 30 tablet 3   aspirin EC 81 MG tablet Take 81 mg by mouth daily.     gentamicin cream (GARAMYCIN) 0.1 % Apply 1 Application topically daily. 15 g 0   glucose blood (ACCU-CHEK GUIDE) test strip 1 each by Other route See admin instructions. 100 each 0   labetalol (NORMODYNE) 100 MG tablet Take 1 tablet (100 mg total) by mouth 3 (three) times daily. 90 tablet 3   levothyroxine (SYNTHROID) 25 MCG tablet Take 1 tablet (25 mcg total) by mouth daily. 90 tablet 0   methadone (DOLOPHINE) 10 MG/ML solution Take 120 mg by mouth daily. Changed by Dr Posey Pronto to get the right home dose w/o sending rx     metolazone (ZAROXOLYN) 2.5 MG tablet Take 1 tablet (2.5 mg total) by mouth every other day. 90 tablet 0   naloxone (NARCAN) nasal spray 4 mg/0.1 mL One spray in either nostril once for known/suspected opioid overdose. May  repeat every 2-3 minutes in alternating nostril til EMS arrives     omeprazole (PRILOSEC) 20 MG capsule Take 1 capsule (20 mg total) by mouth daily. 90 capsule 0   No current facility-administered medications for this visit.    Allergies  Allergen Reactions   Cephalexin Other (See Comments) and Rash    Unknown   Penicillins Rash    Has patient had a PCN reaction causing immediate rash, facial/tongue/throat swelling, SOB or lightheadedness with hypotension: No Has patient had a PCN reaction causing severe rash involving mucus membranes or skin necrosis: No Has patient had a PCN reaction that required hospitalization: No Has patient had a PCN reaction occurring within the last 10 years: No If all of the above answers are "NO", then may proceed with Cephalosporin use.    Family History  Problem Relation Age of Onset   Depression Mother    Diabetes Father    Heart disease Maternal Grandmother    Depression Maternal Grandmother    Dementia Maternal Grandmother    Diabetes Maternal Grandmother    Diabetes Paternal Grandmother     Social History   Socioeconomic History   Marital status: Single    Spouse name: Not on file   Number of children: 2   Years of education: Not on  file   Highest education level: Not on file  Occupational History   Not on file  Tobacco Use   Smoking status: Every Day    Packs/day: 0.50    Years: 24.00    Total pack years: 12.00    Types: Cigarettes   Smokeless tobacco: Never   Tobacco comments:    has cut down from 2 PPD  Vaping Use   Vaping Use: Never used  Substance and Sexual Activity   Alcohol use: No   Drug use: No   Sexual activity: Yes    Birth control/protection: Surgical  Other Topics Concern   Not on file  Social History Narrative   Not on file   Social Determinants of Health   Financial Resource Strain: Low Risk  (12/08/2018)   Overall Financial Resource Strain (CARDIA)    Difficulty of Paying Living Expenses: Not very hard   Food Insecurity: No Food Insecurity (12/08/2018)   Hunger Vital Sign    Worried About Running Out of Food in the Last Year: Never true    Ran Out of Food in the Last Year: Never true  Transportation Needs: No Transportation Needs (12/08/2018)   PRAPARE - Hydrologist (Medical): No    Lack of Transportation (Non-Medical): No  Physical Activity: Not on file  Stress: Not on file  Social Connections: Unknown (12/08/2018)   Social Connection and Isolation Panel [NHANES]    Frequency of Communication with Friends and Family: More than three times a week    Frequency of Social Gatherings with Friends and Family: Not on file    Attends Religious Services: Not on file    Active Member of Clubs or Organizations: Not on file    Attends Archivist Meetings: Not on file    Marital Status: Not on file  Intimate Partner Violence: Not At Risk (12/08/2018)   Humiliation, Afraid, Rape, and Kick questionnaire    Fear of Current or Ex-Partner: No    Emotionally Abused: No    Physically Abused: No    Sexually Abused: No     Constitutional: Patient reports abnormal weight gain.  Denies fever, malaise, fatigue, headache.  HEENT: Denies eye pain, eye redness, ear pain, ringing in the ears, wax buildup, runny nose, nasal congestion, bloody nose, or sore throat. Respiratory: Denies difficulty breathing, shortness of breath, cough or sputum production.   Cardiovascular: Denies chest pain, chest tightness, palpitations or swelling in the hands or feet.  Gastrointestinal: Denies abdominal pain, bloating, constipation, diarrhea or blood in the stool.  GU: Denies urgency, frequency, pain with urination, burning sensation, blood in urine, odor or discharge. Musculoskeletal: Denies decrease in range of motion, difficulty with gait, muscle pain or joint pain and swelling.  Skin: Denies redness, rashes, lesions or ulcercations.  Neurological: Denies dizziness, difficulty with  memory, difficulty with speech or problems with balance and coordination.  Psych: Denies anxiety, depression, SI/HI.  No other specific complaints in a complete review of systems (except as listed in HPI above).  Objective:   Physical Exam  There were no vitals taken for this visit. Wt Readings from Last 3 Encounters:  10/30/21 210 lb (95.3 kg)  10/29/21 210 lb (95.3 kg)  10/13/21 198 lb 6.6 oz (90 kg)    General: Appears their stated age, well developed, well nourished in NAD. Skin: Warm, dry and intact. No rashes, lesions or ulcerations noted. HEENT: Head: normal shape and size; Eyes: sclera white, no icterus, conjunctiva pink, PERRLA and  EOMs intact; Ears: Tm's gray and intact, normal light reflex; Nose: mucosa pink and moist, septum midline; Throat/Mouth: Teeth present, mucosa pink and moist, no exudate, lesions or ulcerations noted.  Neck:  Neck supple, trachea midline. No masses, lumps or thyromegaly present.  Cardiovascular: Normal rate and rhythm. S1,S2 noted.  No murmur, rubs or gallops noted. No JVD or BLE edema. No carotid bruits noted. Pulmonary/Chest: Normal effort and positive vesicular breath sounds. No respiratory distress. No wheezes, rales or ronchi noted.  Abdomen: Soft and nontender. Normal bowel sounds. No distention or masses noted. Liver, spleen and kidneys non palpable. Musculoskeletal: Normal range of motion. No signs of joint swelling. No difficulty with gait.  Neurological: Alert and oriented. Cranial nerves II-XII grossly intact. Coordination normal.  Psychiatric: Mood and affect normal. Behavior is normal. Judgment and thought content normal.    BMET    Component Value Date/Time   NA 137 10/30/2021 1553   NA 127 (L) 01/20/2012 1610   K 5.9 (H) 10/30/2021 1553   K 3.5 01/20/2012 1610   CL 103 10/30/2021 1553   CL 91 (L) 01/20/2012 1610   CO2 22 10/30/2021 1553   CO2 26 01/20/2012 1610   GLUCOSE 89 10/30/2021 1553   GLUCOSE 560 (HH) 01/20/2012 1610    BUN 83 (H) 10/30/2021 1553   BUN 10 01/20/2012 1610   CREATININE 16.31 (H) 10/30/2021 1553   CREATININE 17.25 (H) 10/29/2021 1547   CALCIUM 8.5 (L) 10/30/2021 1553   CALCIUM 8.7 01/20/2012 1610   GFRNONAA 3 (L) 10/30/2021 1553   GFRNONAA 71 09/28/2015 1524   GFRAA 35 (L) 11/23/2018 1719   GFRAA 82 09/28/2015 1524    Lipid Panel     Component Value Date/Time   CHOL 258 (H) 11/23/2018 1719   TRIG 113 11/23/2018 1719   HDL 55 11/23/2018 1719   CHOLHDL 4.7 11/23/2018 1719   VLDL 23 11/23/2018 1719   LDLCALC 180 (H) 11/23/2018 1719    CBC    Component Value Date/Time   WBC 7.8 10/30/2021 1553   RBC 2.20 (L) 10/30/2021 1553   HGB 6.4 (L) 10/30/2021 1553   HGB 12.8 01/20/2012 1610   HCT 20.6 (L) 10/30/2021 1553   HCT 36.8 01/20/2012 1610   PLT 213 10/30/2021 1553   PLT 95 (L) 01/20/2012 1610   MCV 93.6 10/30/2021 1553   MCV 89 01/20/2012 1610   MCH 29.1 10/30/2021 1553   MCHC 31.1 10/30/2021 1553   RDW 14.7 10/30/2021 1553   RDW 12.6 01/20/2012 1610   LYMPHSABS 0.9 10/30/2021 1553   MONOABS 0.5 10/30/2021 1553   EOSABS 0.5 10/30/2021 1553   BASOSABS 0.1 10/30/2021 1553    Hgb A1C Lab Results  Component Value Date   HGBA1C 4.8 10/01/2021            Assessment & Plan:   Mobility Assessment, Encounter for Form Completion with Patient, Abnormal Weight Gain secondary to ESRD on PD:  Mobility assessment form completed  Patient has been dismissed from the practice due to chronic no-shows, no follow-up needed Webb Silversmith, NP

## 2021-11-18 ENCOUNTER — Other Ambulatory Visit: Payer: Self-pay

## 2021-11-18 ENCOUNTER — Emergency Department: Payer: Medicaid Other

## 2021-11-18 ENCOUNTER — Emergency Department
Admission: EM | Admit: 2021-11-18 | Discharge: 2021-11-19 | Disposition: A | Discharge status: Home / Self Care | Payer: Medicaid Other | Attending: Emergency Medicine | Admitting: Emergency Medicine

## 2021-11-18 DIAGNOSIS — Z992 Dependence on renal dialysis: Secondary | ICD-10-CM | POA: Diagnosis not present

## 2021-11-18 DIAGNOSIS — R609 Edema, unspecified: Secondary | ICD-10-CM

## 2021-11-18 DIAGNOSIS — R600 Localized edema: Secondary | ICD-10-CM | POA: Diagnosis not present

## 2021-11-18 DIAGNOSIS — M7989 Other specified soft tissue disorders: Secondary | ICD-10-CM | POA: Diagnosis present

## 2021-11-18 DIAGNOSIS — N186 End stage renal disease: Secondary | ICD-10-CM | POA: Insufficient documentation

## 2021-11-18 LAB — CBC
HCT: 27.7 % — ABNORMAL LOW (ref 36.0–46.0)
Hemoglobin: 8.8 g/dL — ABNORMAL LOW (ref 12.0–15.0)
MCH: 28.3 pg (ref 26.0–34.0)
MCHC: 31.8 g/dL (ref 30.0–36.0)
MCV: 89.1 fL (ref 80.0–100.0)
Platelets: 258 10*3/uL (ref 150–400)
RBC: 3.11 MIL/uL — ABNORMAL LOW (ref 3.87–5.11)
RDW: 14.6 % (ref 11.5–15.5)
WBC: 10.5 10*3/uL (ref 4.0–10.5)
nRBC: 0 % (ref 0.0–0.2)

## 2021-11-18 LAB — BASIC METABOLIC PANEL
Anion gap: 17 — ABNORMAL HIGH (ref 5–15)
BUN: 73 mg/dL — ABNORMAL HIGH (ref 6–20)
CO2: 23 mmol/L (ref 22–32)
Calcium: 8.2 mg/dL — ABNORMAL LOW (ref 8.9–10.3)
Chloride: 92 mmol/L — ABNORMAL LOW (ref 98–111)
Creatinine, Ser: 16.1 mg/dL — ABNORMAL HIGH (ref 0.44–1.00)
GFR, Estimated: 3 mL/min — ABNORMAL LOW (ref >60)
Glucose, Bld: 105 mg/dL — ABNORMAL HIGH (ref 70–99)
Potassium: 4.8 mmol/L (ref 3.5–5.1)
Sodium: 132 mmol/L — ABNORMAL LOW (ref 135–145)

## 2021-11-18 LAB — TROPONIN I (HIGH SENSITIVITY)
Troponin I (High Sensitivity): 69 ng/L — ABNORMAL HIGH (ref <18)
Troponin I (High Sensitivity): 70 ng/L — ABNORMAL HIGH (ref <18)

## 2021-11-18 NOTE — ED Triage Notes (Signed)
Pt presents via POV c/o extremity edema. Pt reports chest pain and SOB. Reports she does peritoneal dialysis however has not been working and is retaining fluid. Reports abd is tight.

## 2021-11-18 NOTE — ED Provider Notes (Incomplete)
[facility] Provider Note    Event Date/Time   First MD Initiated Contact with Patient 11/18/21 2311     (approximate)   History   Leg Swelling   HPI  Nichole Wiley is a 41 y.o. female whose medical history is most notable for chronic end-stage renal disease on peritoneal dialysis, as well as's chronic pain syndrome, pelvic floor dysfunction, prior history of opioid addiction, etc.  [name] is her nephrologist.  She presents tonight for gradual worsening of her extremity swelling.  This has been going on for a few weeks but it has gotten severe.  She said that her legs feel so tight that they feel like they are going to burst.  She feels like she has swelling throughout her body including her abdomen and her arms as well.  She is not having any increased work of breathing.  She said that she was seen in the emergency department a couple weeks ago and was given an extra dose of Lasix.  She then followed up with [name] who put her on torsemide; she claims that she was taking 100 mg of torsemide either once or twice a day and that it "did nothing".  She has been out of it for about a week.  She also states that [name] was concerned about the possibility of a catheter infection and that she has completed an extensive course of antibiotics.  She was supposed to go this week to have the peritoneal dialysis catheter removed in favor of a temporary hemodialysis catheter, but that had to be canceled because her fianc just died and she is having the funeral in about 3 days.  She states that she called [name] today because of her swelling getting worse and her legs tingling and burning.  He said that she needed to come to the emergency department for evaluation to see if there is still an infection at the catheter site.  Of note, she reports that she still has been able to use the peritoneal dialysis catheter on a daily basis.  She says she does not wait the fluid  that comes off but it has been functional.  She just thinks it is not taking off enough.     Physical Exam   Triage Vital Signs: ED Triage Vitals  Enc Vitals Group     BP 11/18/21 1943 (!) 168/85     Pulse Rate 11/18/21 1943 91     Resp 11/18/21 1943 18     Temp 11/18/21 1943 98.7 F (37.1 C)     Temp Source 11/18/21 1943 Oral     SpO2 11/18/21 1943 95 %     Weight 11/18/21 1943 79.4 kg (175 lb)     Height 11/18/21 1943 1.702 m ('5\' 7"'$ )     Head Circumference --      Peak Flow --      Pain Score 11/18/21 1947 8     Pain Loc --      Pain Edu? --      Excl. in [place]? --     Most recent vital signs: Vitals:   11/19/21 0300 11/19/21 0347  BP: (!) 153/82 130/78  Pulse: 81 88  Resp: 18 18  Temp: 98 F (36.7 C) 98.2 F (36.8 C)  SpO2: 100% 98%     General: Awake, no distress.  Awake and alert, oriented. CV:  Good peripheral perfusion.  Normal heart sounds. Resp:  Normal effort.  Lungs  are clear to auscultation. Abd:  No distention.  No tenderness to palpation.  Peritoneal dialysis catheter site appears normal.  No swelling or induration around the site, no erythema. Other:  The patient has what appears to be chronic peripheral edema most notable in her legs, but also possibly some very mild swelling in her arms as well.  On exam the edema seems to be about 1+ pitting edema, and the legs are somewhat tense but not tight.  There is a small area of erythema on the left anterior lower leg that is about the size of the palm of my hand, no obvious infection but infection is possible, though I suspect it is more skin changes due to the peripheral edema.  Nothing to suggest DVT.   ED Results / Procedures / Treatments   Labs (all labs ordered are listed, but only abnormal results are displayed) Labs Reviewed  BASIC METABOLIC PANEL - Abnormal; Notable for the following components:      Result Value   Sodium 132 (*)    Chloride 92 (*)    Glucose, Bld 105 (*)    BUN 73 (*)     Creatinine, Ser 16.10 (*)    Calcium 8.2 (*)    GFR, Estimated 3 (*)    Anion gap 17 (*)    All other components within normal limits  CBC - Abnormal; Notable for the following components:   RBC 3.11 (*)    Hemoglobin 8.8 (*)    HCT 27.7 (*)    All other components within normal limits  TROPONIN I (HIGH SENSITIVITY) - Abnormal; Notable for the following components:   Troponin I (High Sensitivity) 69 (*)    All other components within normal limits  TROPONIN I (HIGH SENSITIVITY) - Abnormal; Notable for the following components:   Troponin I (High Sensitivity) 70 (*)    All other components within normal limits  POC URINE PREG, ED     EKG  ED ECG REPORT I, Hinda Kehr, the attending physician, personally viewed and interpreted this ECG.  Date: 11/18/2021 EKG Time: 19: 48 Rate: 91 Rhythm: normal sinus rhythm QRS Axis: normal Intervals: normal ST/T Wave abnormalities: Non-specific ST segment / T-wave changes, but no clear evidence of acute ischemia. Narrative Interpretation: no definitive evidence of acute ischemia; does not meet STEMI criteria.    RADIOLOGY I viewed and interpreted the patient's two-view chest x-ray.  There is some streakiness in the bases which I suspect is atelectasis.  No obvious pneumonia or pulmonary edema.  The radiologist commented that the overall appearance is improved from prior.    PROCEDURES:  Critical Care performed: No  Procedures   MEDICATIONS ORDERED IN ED: Medications  furosemide (LASIX) 120 mg in dextrose 5 % 50 mL IVPB (0 mg Intravenous Stopped 11/19/21 0344)  vancomycin (VANCOCIN) IVPB 1000 mg/200 mL premix (0 mg Intravenous Stopped 11/19/21 0230)     IMPRESSION / MDM / ASSESSMENT AND PLAN / ED COURSE  I reviewed the triage vital signs and the nursing notes.                              Differential diagnosis includes, but is not limited to, chronic volume overload, malfunction of PD catheter, spontaneous bacterial  peritonitis, anasarca, electrolyte or metabolic abnormality.  Patient's presentation is most consistent with acute presentation with potential threat to life or bodily function.  Labs/studies ordered: EKG, two-view chest x-ray, high-sensitivity troponin x2, CBC,  basic metabolic panel.  Vital signs are stable and within normal limits.  She is afebrile and not tachycardic.  Labs are notable for stable BMP compared to prior with a creatinine of 16.  CBC is normal with improved hemoglobin compared to the past of 8.8 and no leukocytosis.  High-sensitivity troponin is slightly elevated x2 but without substantial change between the 2 values (69 and 70) I believe this is chronic for her and due to her renal failure.  As documented above, chest x-ray actually shows improved aeration compared to prior even though she obviously suffers from chronic peripheral edema and possibly some chronic generalized anasarca.  The PD catheter site is well-appearing and she has no tenderness to palpation the abdomen.  Reportedly [name] talked with her earlier tonight and suggest she come to the emergency department for evaluation.  At this point there is no clear indication that she needs hospitalization or additional intervention, possibly an extra dose of Lasix or torsemide and to be started back on a diuretic (the patient verified that she continues to make urine).  She is using her PD catheter and is able to get off fluid every day.  I suspect this will be an issue of outpatient follow-up, but given Dr. Keturah Barre close involvement with her case, I am paging him for a consult at about 2345 to discuss the case and ask if he has any additional plans for intervention.   Clinical Course as of 11/19/21 0528  Tue Nov 19, 2021  0015 I consulted by phone on with [name] with nephrology.  We discussed the case in detail and he knows the patient well.  He reports that she missed a dose of vancomycin last week and that previously  she had pus coming from the catheter site, so she was on a course of vancomycin as an outpatient.  He also confirmed that she had been taking torsemide 100 mg a day.  He recommended that we place an IV and give a dose of furosemide 120 mg IV (given her creatinine of 16) and also give a dose of vancomycin 1 g IV.  She then needs to follow-up with Dr. Raul Del as an outpatient.  I updated the patient and she continues to be frustrated that there is not an immediate fix to her chronic peripheral edema and mild anasarca, but I explained that this is a long-term issue that needs to be addressed in the long-term and that we are starting the appropriate treatment.  She said that she understands.  I placed the orders for vancomycin 1 g IV, furosemide 120 mg IV, and will work on her follow-up paperwork.  Assuming no other complications in the meantime, she is appropriate for discharge.  I considered hospitalization but feel reassured by the consult with [name] and it sounds like she has a good outpatient follow-up plan. [CF]    Clinical Course User Index [CF] Hinda Kehr, MD     FINAL CLINICAL IMPRESSION(S) / ED DIAGNOSES   Final diagnoses:  Peripheral edema  ESRD on peritoneal dialysis [facility])     Rx / DC Orders   ED Discharge Orders          Ordered    torsemide (DEMADEX) 100 MG tablet  Daily        11/19/21 0231             Note:  This document was prepared using Dragon voice recognition software and may include unintentional dictation errors.   Karma Greaser,  Tommi Rumps, MD 11/19/21 0938    Hinda Kehr, MD 11/19/21 [phone]

## 2021-11-19 MED ORDER — FUROSEMIDE 10 MG/ML IJ SOLN
120.0000 mg | Freq: Once | INTRAVENOUS | Status: AC
  Administered 2021-11-19: 120 mg via INTRAVENOUS
  Filled 2021-11-19: qty 10

## 2021-11-19 MED ORDER — TORSEMIDE 100 MG PO TABS: 100.0000 mg | ORAL_TABLET | Freq: Every day | ORAL | 1 refills | Status: AC

## 2021-11-19 MED ORDER — VANCOMYCIN HCL IN DEXTROSE 1-5 GM/200ML-% IV SOLN
1000.0000 mg | INTRAVENOUS | Status: AC
  Administered 2021-11-19: 1000 mg via INTRAVENOUS
  Filled 2021-11-19: qty 200

## 2021-11-19 NOTE — ED Notes (Signed)
DC instructions given verbally and in writing... understanding voiced, siganture obtained.  Pt taken to POV via wheelchair, mother at pts side.

## 2021-11-19 NOTE — ED Notes (Signed)
Informed pt need of urine for POC urine preg.  Pt reports she had a hysterectomy done in 2005.

## 2021-11-19 NOTE — ED Notes (Signed)
Pure wick placed. Med due requested from Pharmacy

## 2021-11-19 NOTE — ED Notes (Signed)
Requested med from pharmacy  

## 2021-11-19 NOTE — ED Notes (Addendum)
Pt unable to use purewick.... removed and assisted to and from [facility]. Med received from pharmacy and started infusing.

## 2021-11-19 NOTE — Discharge Instructions (Signed)
Please continue taking your medications in addition to the new prescription for torsemide.  Continue to use your peritoneal dialysis until you can follow-up with Dr. Raul Del to discuss other options.  You can also call the office of [name] to schedule a follow-up appointment.    Return to the emergency department if you develop new or worsening symptoms that concern you.

## 2021-11-20 ENCOUNTER — Emergency Department: Payer: Medicaid Other

## 2021-11-20 ENCOUNTER — Inpatient Hospital Stay: Payer: Medicaid Other

## 2021-11-20 ENCOUNTER — Inpatient Hospital Stay
Admission: EM | Admit: 2021-11-20 | Discharge: 2021-11-29 | DRG: 907 | Discharge status: Against Medical Advice | Payer: Medicaid Other | Attending: Internal Medicine | Admitting: Internal Medicine

## 2021-11-20 DIAGNOSIS — E13649 Other specified diabetes mellitus with hypoglycemia without coma: Secondary | ICD-10-CM | POA: Diagnosis present

## 2021-11-20 DIAGNOSIS — F1120 Opioid dependence, uncomplicated: Secondary | ICD-10-CM | POA: Diagnosis present

## 2021-11-20 DIAGNOSIS — Z8249 Family history of ischemic heart disease and other diseases of the circulatory system: Secondary | ICD-10-CM

## 2021-11-20 DIAGNOSIS — I5021 Acute systolic (congestive) heart failure: Secondary | ICD-10-CM | POA: Diagnosis not present

## 2021-11-20 DIAGNOSIS — Y838 Other surgical procedures as the cause of abnormal reaction of the patient, or of later complication, without mention of misadventure at the time of the procedure: Secondary | ICD-10-CM | POA: Diagnosis present

## 2021-11-20 DIAGNOSIS — F17210 Nicotine dependence, cigarettes, uncomplicated: Secondary | ICD-10-CM | POA: Diagnosis not present

## 2021-11-20 DIAGNOSIS — J449 Chronic obstructive pulmonary disease, unspecified: Secondary | ICD-10-CM | POA: Diagnosis present

## 2021-11-20 DIAGNOSIS — Z90721 Acquired absence of ovaries, unilateral: Secondary | ICD-10-CM

## 2021-11-20 DIAGNOSIS — Z880 Allergy status to penicillin: Secondary | ICD-10-CM

## 2021-11-20 DIAGNOSIS — I214 Non-ST elevation (NSTEMI) myocardial infarction: Secondary | ICD-10-CM | POA: Diagnosis not present

## 2021-11-20 DIAGNOSIS — E1322 Other specified diabetes mellitus with diabetic chronic kidney disease: Secondary | ICD-10-CM | POA: Diagnosis present

## 2021-11-20 DIAGNOSIS — E669 Obesity, unspecified: Secondary | ICD-10-CM | POA: Diagnosis present

## 2021-11-20 DIAGNOSIS — Z86010 Personal history of colonic polyps: Secondary | ICD-10-CM

## 2021-11-20 DIAGNOSIS — B9562 Methicillin resistant Staphylococcus aureus infection as the cause of diseases classified elsewhere: Secondary | ICD-10-CM | POA: Diagnosis present

## 2021-11-20 DIAGNOSIS — Z9851 Tubal ligation status: Secondary | ICD-10-CM

## 2021-11-20 DIAGNOSIS — F1123 Opioid dependence with withdrawal: Secondary | ICD-10-CM | POA: Diagnosis not present

## 2021-11-20 DIAGNOSIS — Z833 Family history of diabetes mellitus: Secondary | ICD-10-CM

## 2021-11-20 DIAGNOSIS — N179 Acute kidney failure, unspecified: Secondary | ICD-10-CM | POA: Diagnosis present

## 2021-11-20 DIAGNOSIS — G894 Chronic pain syndrome: Secondary | ICD-10-CM | POA: Diagnosis present

## 2021-11-20 DIAGNOSIS — Z98891 History of uterine scar from previous surgery: Secondary | ICD-10-CM

## 2021-11-20 DIAGNOSIS — R6520 Severe sepsis without septic shock: Secondary | ICD-10-CM | POA: Diagnosis present

## 2021-11-20 DIAGNOSIS — E785 Hyperlipidemia, unspecified: Secondary | ICD-10-CM | POA: Diagnosis present

## 2021-11-20 DIAGNOSIS — Z90710 Acquired absence of both cervix and uterus: Secondary | ICD-10-CM

## 2021-11-20 DIAGNOSIS — J9602 Acute respiratory failure with hypercapnia: Secondary | ICD-10-CM | POA: Diagnosis present

## 2021-11-20 DIAGNOSIS — I132 Hypertensive heart and chronic kidney disease with heart failure and with stage 5 chronic kidney disease, or end stage renal disease: Secondary | ICD-10-CM | POA: Diagnosis present

## 2021-11-20 DIAGNOSIS — E039 Hypothyroidism, unspecified: Secondary | ICD-10-CM | POA: Diagnosis present

## 2021-11-20 DIAGNOSIS — I2510 Atherosclerotic heart disease of native coronary artery without angina pectoris: Secondary | ICD-10-CM | POA: Diagnosis not present

## 2021-11-20 DIAGNOSIS — F32A Depression, unspecified: Secondary | ICD-10-CM | POA: Diagnosis present

## 2021-11-20 DIAGNOSIS — R1900 Intra-abdominal and pelvic swelling, mass and lump, unspecified site: Secondary | ICD-10-CM | POA: Diagnosis present

## 2021-11-20 DIAGNOSIS — G928 Other toxic encephalopathy: Secondary | ICD-10-CM | POA: Diagnosis present

## 2021-11-20 DIAGNOSIS — Z20822 Contact with and (suspected) exposure to covid-19: Secondary | ICD-10-CM | POA: Diagnosis present

## 2021-11-20 DIAGNOSIS — E876 Hypokalemia: Secondary | ICD-10-CM | POA: Diagnosis present

## 2021-11-20 DIAGNOSIS — A419 Sepsis, unspecified organism: Secondary | ICD-10-CM | POA: Diagnosis present

## 2021-11-20 DIAGNOSIS — R601 Generalized edema: Secondary | ICD-10-CM | POA: Diagnosis present

## 2021-11-20 DIAGNOSIS — N2581 Secondary hyperparathyroidism of renal origin: Secondary | ICD-10-CM | POA: Diagnosis present

## 2021-11-20 DIAGNOSIS — Z1623 Resistance to quinolones and fluoroquinolones: Secondary | ICD-10-CM | POA: Diagnosis present

## 2021-11-20 DIAGNOSIS — E8721 Acute metabolic acidosis: Secondary | ICD-10-CM | POA: Diagnosis present

## 2021-11-20 DIAGNOSIS — I469 Cardiac arrest, cause unspecified: Secondary | ICD-10-CM | POA: Diagnosis present

## 2021-11-20 DIAGNOSIS — Z820 Family history of epilepsy and other diseases of the nervous system: Secondary | ICD-10-CM

## 2021-11-20 DIAGNOSIS — Z5329 Procedure and treatment not carried out because of patient's decision for other reasons: Secondary | ICD-10-CM | POA: Diagnosis not present

## 2021-11-20 DIAGNOSIS — I10 Essential (primary) hypertension: Secondary | ICD-10-CM | POA: Diagnosis present

## 2021-11-20 DIAGNOSIS — N186 End stage renal disease: Secondary | ICD-10-CM | POA: Diagnosis present

## 2021-11-20 DIAGNOSIS — T381X6A Underdosing of thyroid hormones and substitutes, initial encounter: Secondary | ICD-10-CM | POA: Diagnosis present

## 2021-11-20 DIAGNOSIS — Z79899 Other long term (current) drug therapy: Secondary | ICD-10-CM

## 2021-11-20 DIAGNOSIS — D631 Anemia in chronic kidney disease: Secondary | ICD-10-CM | POA: Diagnosis present

## 2021-11-20 DIAGNOSIS — J90 Pleural effusion, not elsewhere classified: Secondary | ICD-10-CM | POA: Diagnosis present

## 2021-11-20 DIAGNOSIS — T8571XA Infection and inflammatory reaction due to peritoneal dialysis catheter, initial encounter: Principal | ICD-10-CM | POA: Diagnosis present

## 2021-11-20 DIAGNOSIS — M7989 Other specified soft tissue disorders: Secondary | ICD-10-CM | POA: Diagnosis present

## 2021-11-20 DIAGNOSIS — Z95828 Presence of other vascular implants and grafts: Secondary | ICD-10-CM | POA: Diagnosis not present

## 2021-11-20 DIAGNOSIS — I420 Dilated cardiomyopathy: Secondary | ICD-10-CM | POA: Diagnosis present

## 2021-11-20 DIAGNOSIS — M9905 Segmental and somatic dysfunction of pelvic region: Secondary | ICD-10-CM | POA: Diagnosis present

## 2021-11-20 DIAGNOSIS — Z87448 Personal history of other diseases of urinary system: Secondary | ICD-10-CM

## 2021-11-20 DIAGNOSIS — K659 Peritonitis, unspecified: Secondary | ICD-10-CM | POA: Diagnosis present

## 2021-11-20 DIAGNOSIS — K219 Gastro-esophageal reflux disease without esophagitis: Secondary | ICD-10-CM | POA: Diagnosis present

## 2021-11-20 DIAGNOSIS — D179 Benign lipomatous neoplasm, unspecified: Secondary | ICD-10-CM | POA: Diagnosis present

## 2021-11-20 DIAGNOSIS — Z79890 Hormone replacement therapy (postmenopausal): Secondary | ICD-10-CM

## 2021-11-20 DIAGNOSIS — Z818 Family history of other mental and behavioral disorders: Secondary | ICD-10-CM

## 2021-11-20 DIAGNOSIS — Z881 Allergy status to other antibiotic agents status: Secondary | ICD-10-CM

## 2021-11-20 DIAGNOSIS — J9601 Acute respiratory failure with hypoxia: Secondary | ICD-10-CM | POA: Diagnosis present

## 2021-11-20 DIAGNOSIS — Z992 Dependence on renal dialysis: Secondary | ICD-10-CM | POA: Diagnosis not present

## 2021-11-20 DIAGNOSIS — Z7982 Long term (current) use of aspirin: Secondary | ICD-10-CM

## 2021-11-20 DIAGNOSIS — I252 Old myocardial infarction: Secondary | ICD-10-CM

## 2021-11-20 DIAGNOSIS — E871 Hypo-osmolality and hyponatremia: Secondary | ICD-10-CM | POA: Diagnosis not present

## 2021-11-20 DIAGNOSIS — I25110 Atherosclerotic heart disease of native coronary artery with unstable angina pectoris: Secondary | ICD-10-CM | POA: Diagnosis present

## 2021-11-20 DIAGNOSIS — Z6831 Body mass index (BMI) 31.0-31.9, adult: Secondary | ICD-10-CM

## 2021-11-20 DIAGNOSIS — M1990 Unspecified osteoarthritis, unspecified site: Secondary | ICD-10-CM | POA: Diagnosis present

## 2021-11-20 DIAGNOSIS — F419 Anxiety disorder, unspecified: Secondary | ICD-10-CM | POA: Diagnosis present

## 2021-11-20 LAB — CBC WITH DIFFERENTIAL/PLATELET
Abs Immature Granulocytes: 0.29 10*3/uL — ABNORMAL HIGH (ref 0.00–0.07)
Basophils Absolute: 0.2 10*3/uL — ABNORMAL HIGH (ref 0.0–0.1)
Basophils Relative: 1 %
Eosinophils Absolute: 0.5 10*3/uL (ref 0.0–0.5)
Eosinophils Relative: 3 %
HCT: 30.7 % — ABNORMAL LOW (ref 36.0–46.0)
Hemoglobin: 9.3 g/dL — ABNORMAL LOW (ref 12.0–15.0)
Immature Granulocytes: 2 %
Lymphocytes Relative: 20 %
Lymphs Abs: 3.1 10*3/uL (ref 0.7–4.0)
MCH: 28.4 pg (ref 26.0–34.0)
MCHC: 30.3 g/dL (ref 30.0–36.0)
MCV: 93.6 fL (ref 80.0–100.0)
Monocytes Absolute: 0.9 10*3/uL (ref 0.1–1.0)
Monocytes Relative: 6 %
Neutro Abs: 10.3 10*3/uL — ABNORMAL HIGH (ref 1.7–7.7)
Neutrophils Relative %: 68 %
Platelets: 357 10*3/uL (ref 150–400)
RBC: 3.28 MIL/uL — ABNORMAL LOW (ref 3.87–5.11)
RDW: 14.9 % (ref 11.5–15.5)
WBC: 15.1 10*3/uL — ABNORMAL HIGH (ref 4.0–10.5)
nRBC: 0.2 % (ref 0.0–0.2)

## 2021-11-20 LAB — URINALYSIS, COMPLETE (UACMP) WITH MICROSCOPIC
Bacteria, UA: NONE SEEN
Bilirubin Urine: NEGATIVE
Glucose, UA: 50 mg/dL — AB
Hgb urine dipstick: NEGATIVE
Ketones, ur: NEGATIVE mg/dL
Leukocytes,Ua: NEGATIVE
Nitrite: NEGATIVE
Protein, ur: 100 mg/dL — AB
Specific Gravity, Urine: 1.009 (ref 1.005–1.030)
pH: 8 (ref 5.0–8.0)

## 2021-11-20 LAB — URINE DRUG SCREEN, QUALITATIVE (ARMC ONLY)
Amphetamines, Ur Screen: NOT DETECTED
Barbiturates, Ur Screen: NOT DETECTED
Benzodiazepine, Ur Scrn: POSITIVE — AB
Cannabinoid 50 Ng, Ur ~~LOC~~: NOT DETECTED
Cocaine Metabolite,Ur ~~LOC~~: NOT DETECTED
MDMA (Ecstasy)Ur Screen: NOT DETECTED
Methadone Scn, Ur: POSITIVE — AB
Opiate, Ur Screen: NOT DETECTED
Phencyclidine (PCP) Ur S: NOT DETECTED
Tricyclic, Ur Screen: NOT DETECTED

## 2021-11-20 LAB — COMPREHENSIVE METABOLIC PANEL
ALT: 23 U/L (ref 0–44)
AST: 43 U/L — ABNORMAL HIGH (ref 15–41)
Albumin: 2.5 g/dL — ABNORMAL LOW (ref 3.5–5.0)
Alkaline Phosphatase: 144 U/L — ABNORMAL HIGH (ref 38–126)
Anion gap: 19 — ABNORMAL HIGH (ref 5–15)
BUN: 70 mg/dL — ABNORMAL HIGH (ref 6–20)
CO2: 18 mmol/L — ABNORMAL LOW (ref 22–32)
Calcium: 10.1 mg/dL (ref 8.9–10.3)
Chloride: 96 mmol/L — ABNORMAL LOW (ref 98–111)
Creatinine, Ser: 15.66 mg/dL — ABNORMAL HIGH (ref 0.44–1.00)
GFR, Estimated: 3 mL/min — ABNORMAL LOW (ref >60)
Glucose, Bld: 149 mg/dL — ABNORMAL HIGH (ref 70–99)
Potassium: 5.1 mmol/L (ref 3.5–5.1)
Sodium: 133 mmol/L — ABNORMAL LOW (ref 135–145)
Total Bilirubin: 0.3 mg/dL (ref 0.3–1.2)
Total Protein: 6.3 g/dL — ABNORMAL LOW (ref 6.5–8.1)

## 2021-11-20 LAB — PROTIME-INR
INR: 1.4 — ABNORMAL HIGH (ref 0.8–1.2)
Prothrombin Time: 16.6 seconds — ABNORMAL HIGH (ref 11.4–15.2)

## 2021-11-20 LAB — MAGNESIUM: Magnesium: 2.1 mg/dL (ref 1.7–2.4)

## 2021-11-20 LAB — BASIC METABOLIC PANEL
Anion gap: 16 — ABNORMAL HIGH (ref 5–15)
BUN: 73 mg/dL — ABNORMAL HIGH (ref 6–20)
CO2: 21 mmol/L — ABNORMAL LOW (ref 22–32)
Calcium: 7.8 mg/dL — ABNORMAL LOW (ref 8.9–10.3)
Chloride: 93 mmol/L — ABNORMAL LOW (ref 98–111)
Creatinine, Ser: 16.14 mg/dL — ABNORMAL HIGH (ref 0.44–1.00)
GFR, Estimated: 3 mL/min — ABNORMAL LOW (ref >60)
Glucose, Bld: 151 mg/dL — ABNORMAL HIGH (ref 70–99)
Potassium: 5.7 mmol/L — ABNORMAL HIGH (ref 3.5–5.1)
Sodium: 130 mmol/L — ABNORMAL LOW (ref 135–145)

## 2021-11-20 LAB — MRSA NEXT GEN BY PCR, NASAL: MRSA by PCR Next Gen: DETECTED — AB

## 2021-11-20 LAB — POC URINE PREG, ED: Preg Test, Ur: NEGATIVE

## 2021-11-20 LAB — TROPONIN I (HIGH SENSITIVITY): Troponin I (High Sensitivity): 265 ng/L (ref <18)

## 2021-11-20 LAB — APTT: aPTT: 36 seconds (ref 24–36)

## 2021-11-20 LAB — SARS CORONAVIRUS 2 BY RT PCR: SARS Coronavirus 2 by RT PCR: NEGATIVE

## 2021-11-20 LAB — LACTIC ACID, PLASMA
Lactic Acid, Venous: 7.8 mmol/L (ref 0.5–1.9)
Lactic Acid, Venous: 1 mmol/L (ref 0.5–1.9)

## 2021-11-20 LAB — CBG MONITORING, ED: Glucose-Capillary: 85 mg/dL (ref 70–99)

## 2021-11-20 MED ORDER — PRISMASOL BGK 0/2.5 32-2.5 MEQ/L EC SOLN: Status: DC

## 2021-11-20 MED ORDER — PANTOPRAZOLE SODIUM 40 MG IV SOLR
40.0000 mg | Freq: Every day | INTRAVENOUS | Status: DC
  Administered 2021-11-20: 40 mg via INTRAVENOUS
  Filled 2021-11-20 (×2): qty 10

## 2021-11-20 MED ORDER — CALCIUM CHLORIDE 10 % IV SOLN
INTRAVENOUS | Status: AC | PRN
  Administered 2021-11-20: 1 g via INTRAVENOUS

## 2021-11-20 MED ORDER — MUPIROCIN 2 % EX OINT
1.0000 | TOPICAL_OINTMENT | Freq: Two times a day (BID) | CUTANEOUS | Status: AC
  Administered 2021-11-20 – 2021-11-25 (×10): 1 via NASAL
  Filled 2021-11-20: qty 22

## 2021-11-20 MED ORDER — PROPOFOL 1000 MG/100ML IV EMUL
5.0000 ug/kg/min | INTRAVENOUS | Status: DC
  Administered 2021-11-20: 80 ug/kg/min via INTRAVENOUS
  Administered 2021-11-20: 20 ug/kg/min via INTRAVENOUS
  Administered 2021-11-20: 80 ug/kg/min via INTRAVENOUS
  Administered 2021-11-21 (×2): 70 ug/kg/min via INTRAVENOUS
  Administered 2021-11-21: 75 ug/kg/min via INTRAVENOUS
  Administered 2021-11-21: 70 ug/kg/min via INTRAVENOUS
  Administered 2021-11-21: 60 ug/kg/min via INTRAVENOUS
  Administered 2021-11-21: 70 ug/kg/min via INTRAVENOUS
  Administered 2021-11-21: 60 ug/kg/min via INTRAVENOUS
  Administered 2021-11-21 – 2021-11-22 (×9): 70 ug/kg/min via INTRAVENOUS
  Administered 2021-11-23: 65 ug/kg/min via INTRAVENOUS
  Administered 2021-11-23 (×4): 60 ug/kg/min via INTRAVENOUS
  Filled 2021-11-20 (×24): qty 100

## 2021-11-20 MED ORDER — NOREPINEPHRINE 4 MG/250ML-% IV SOLN: 2.0000 ug/min | INTRAVENOUS | Status: DC

## 2021-11-20 MED ORDER — SODIUM CHLORIDE 0.9 % IV SOLN
2.0000 g | Freq: Once | INTRAVENOUS | Status: AC
  Administered 2021-11-20: 2 g via INTRAVENOUS
  Filled 2021-11-20: qty 12.5

## 2021-11-20 MED ORDER — SODIUM CHLORIDE 0.9 % FOR CRRT: INTRAVENOUS_CENTRAL | Status: DC | PRN

## 2021-11-20 MED ORDER — MIDAZOLAM HCL 2 MG/2ML IJ SOLN
2.0000 mg | INTRAMUSCULAR | Status: DC | PRN
  Administered 2021-11-20 – 2021-11-22 (×4): 2 mg via INTRAVENOUS
  Filled 2021-11-20 (×4): qty 2

## 2021-11-20 MED ORDER — PANTOPRAZOLE 2 MG/ML SUSPENSION
40.0000 mg | Freq: Every day | ORAL | Status: DC
  Administered 2021-11-21 – 2021-11-23 (×3): 40 mg
  Filled 2021-11-20 (×4): qty 20

## 2021-11-20 MED ORDER — ORAL CARE MOUTH RINSE: 15.0000 mL | OROMUCOSAL | Status: DC | PRN

## 2021-11-20 MED ORDER — FENTANYL 2500MCG IN NS 250ML (10MCG/ML) PREMIX INFUSION
0.0000 ug/h | INTRAVENOUS | Status: DC
  Administered 2021-11-20: 50 ug/h via INTRAVENOUS
  Administered 2021-11-21 – 2021-11-22 (×4): 200 ug/h via INTRAVENOUS
  Administered 2021-11-23: 125 ug/h via INTRAVENOUS
  Administered 2021-11-23: 225 ug/h via INTRAVENOUS
  Filled 2021-11-20 (×7): qty 250

## 2021-11-20 MED ORDER — FUROSEMIDE 10 MG/ML IJ SOLN
40.0000 mg | Freq: Once | INTRAMUSCULAR | Status: AC
  Administered 2021-11-20: 40 mg via INTRAVENOUS
  Filled 2021-11-20: qty 4

## 2021-11-20 MED ORDER — VANCOMYCIN HCL 1500 MG/300ML IV SOLN
1500.0000 mg | Freq: Once | INTRAVENOUS | Status: AC
  Administered 2021-11-20: 1500 mg via INTRAVENOUS
  Filled 2021-11-20: qty 300

## 2021-11-20 MED ORDER — ONDANSETRON HCL 4 MG/2ML IJ SOLN: 4.0000 mg | Freq: Four times a day (QID) | INTRAMUSCULAR | Status: DC | PRN

## 2021-11-20 MED ORDER — IOHEXOL 350 MG/ML SOLN
50.0000 mL | Freq: Once | INTRAVENOUS | Status: AC | PRN
  Administered 2021-11-20: 75 mL via INTRAVENOUS

## 2021-11-20 MED ORDER — FENTANYL BOLUS VIA INFUSION
50.0000 ug | INTRAVENOUS | Status: DC | PRN
  Administered 2021-11-22 – 2021-11-23 (×5): 100 ug via INTRAVENOUS

## 2021-11-20 MED ORDER — MIDAZOLAM HCL 2 MG/2ML IJ SOLN
INTRAMUSCULAR | Status: AC
  Administered 2021-11-20: 2 mg via INTRAVENOUS
  Filled 2021-11-20: qty 2

## 2021-11-20 MED ORDER — SODIUM CHLORIDE 0.9% FLUSH: 3.0000 mL | INTRAVENOUS | Status: DC | PRN

## 2021-11-20 MED ORDER — NOREPINEPHRINE 4 MG/250ML-% IV SOLN
0.0000 ug/min | INTRAVENOUS | Status: DC
  Administered 2021-11-20: 8 ug/min via INTRAVENOUS
  Filled 2021-11-20: qty 250

## 2021-11-20 MED ORDER — CHLORHEXIDINE GLUCONATE CLOTH 2 % EX PADS
6.0000 | MEDICATED_PAD | Freq: Every day | CUTANEOUS | Status: DC
  Administered 2021-11-20 – 2021-11-29 (×7): 6 via TOPICAL

## 2021-11-20 MED ORDER — SODIUM CHLORIDE 0.9 % IV SOLN
250.0000 mL | INTRAVENOUS | Status: DC
  Administered 2021-11-26: 250 mL via INTRAVENOUS

## 2021-11-20 MED ORDER — NOREPINEPHRINE 4 MG/250ML-% IV SOLN
INTRAVENOUS | Status: AC
  Administered 2021-11-20: 10 ug/min via INTRAVENOUS
  Filled 2021-11-20: qty 250

## 2021-11-20 MED ORDER — PROPOFOL 1000 MG/100ML IV EMUL
INTRAVENOUS | Status: AC
  Filled 2021-11-20: qty 100

## 2021-11-20 MED ORDER — DOCUSATE SODIUM 50 MG/5ML PO LIQD
100.0000 mg | Freq: Two times a day (BID) | ORAL | Status: DC
  Administered 2021-11-21 – 2021-11-23 (×6): 100 mg
  Filled 2021-11-20 (×6): qty 10

## 2021-11-20 MED ORDER — HEPARIN SODIUM (PORCINE) 1000 UNIT/ML DIALYSIS
1000.0000 [IU] | INTRAMUSCULAR | Status: DC | PRN
  Administered 2021-11-21: 3200 [IU] via INTRAVENOUS_CENTRAL
  Filled 2021-11-20: qty 5

## 2021-11-20 MED ORDER — ACETAMINOPHEN 325 MG PO TABS: 650.0000 mg | ORAL_TABLET | ORAL | Status: DC | PRN

## 2021-11-20 MED ORDER — NOREPINEPHRINE 4 MG/250ML-% IV SOLN: 0.0000 ug/min | INTRAVENOUS | Status: DC

## 2021-11-20 MED ORDER — POLYETHYLENE GLYCOL 3350 17 G PO PACK: 17.0000 g | PACK | Freq: Every day | ORAL | Status: DC | PRN

## 2021-11-20 MED ORDER — DOCUSATE SODIUM 100 MG PO CAPS: 100.0000 mg | ORAL_CAPSULE | Freq: Two times a day (BID) | ORAL | Status: DC | PRN

## 2021-11-20 MED ORDER — POLYETHYLENE GLYCOL 3350 17 G PO PACK
17.0000 g | PACK | Freq: Every day | ORAL | Status: DC
  Administered 2021-11-21 – 2021-11-23 (×3): 17 g
  Filled 2021-11-20 (×3): qty 1

## 2021-11-20 MED ORDER — ORAL CARE MOUTH RINSE
15.0000 mL | OROMUCOSAL | Status: DC
  Administered 2021-11-20 – 2021-11-23 (×38): 15 mL via OROMUCOSAL
  Filled 2021-11-20 (×5): qty 15

## 2021-11-20 MED ORDER — HEPARIN SODIUM (PORCINE) 5000 UNIT/ML IJ SOLN
5000.0000 [IU] | Freq: Three times a day (TID) | INTRAMUSCULAR | Status: DC
  Administered 2021-11-20 – 2021-11-21 (×3): 5000 [IU] via SUBCUTANEOUS
  Filled 2021-11-20 (×3): qty 1

## 2021-11-20 MED ORDER — PROPOFOL 500 MG/50ML IV EMUL: INTRAVENOUS | Status: DC | PRN

## 2021-11-20 MED ORDER — SODIUM CHLORIDE 0.9% FLUSH
3.0000 mL | Freq: Two times a day (BID) | INTRAVENOUS | Status: DC
  Administered 2021-11-20 – 2021-11-29 (×15): 3 mL via INTRAVENOUS

## 2021-11-20 MED ORDER — EPINEPHRINE 1 MG/10ML IJ SOSY
PREFILLED_SYRINGE | INTRAMUSCULAR | Status: AC | PRN
  Administered 2021-11-20 (×2): 1 mg via INTRAVENOUS

## 2021-11-20 MED ORDER — SODIUM CHLORIDE 0.9 % IV SOLN: 250.0000 mL | INTRAVENOUS | Status: DC | PRN

## 2021-11-20 NOTE — Consult Note (Signed)
PHARMACY -  BRIEF ANTIBIOTIC NOTE   Pharmacy has received consult(s) for Vancomycin from an ED provider.  The patient's profile has been reviewed for ht/wt/allergies/indication/available labs.    One time order(s) placed for Vancomycin 1500 mg x1  Further antibiotics/pharmacy consults should be ordered by admitting physician if indicated.                       Thank you,  Gretel Acre, PharmD PGY1 Pharmacy Resident 11/20/2021 6:13 PM

## 2021-11-20 NOTE — Progress Notes (Signed)
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

## 2021-11-20 NOTE — ED Provider Notes (Addendum)
[facility] Provider Note    Event Date/Time   First MD Initiated Contact with Patient 11/20/21 1813     (approximate)   History   No chief complaint on file.   HPI  Nichole Wiley is a 41 y.o. female   Past medical history of chronic kidney disease on peritoneal dialysis, drug use, presents by EMS for respiratory distress who then had a cardiac arrest witnessed in the emergency department.  He arrived in respiratory distress, required respiratory support with bag-valve-mask by our nurses upon arrival to her room, had pulses upon arrival but then lost that shortly after being roomed.  ACLS was immediately performed with high-quality CPR, epinephrine. She was given calcium chloride. The patient was intubated by my colleague. Regained pulses, EKG did not show STEMI, bedside ultrasound did not show effusion, right heart strain.  Reportedly concern for PD site infection recently   Patient became slightly agitated, required increasing doses of propofol sedation, added fentanyl infusion as well.  History was obtained via EMS and a review of external medical notes.      Physical Exam   Triage Vital Signs: ED Triage Vitals  Enc Vitals Group     BP 11/20/21 1724 111/69     Pulse Rate 11/20/21 1719 (!) 116     Resp 11/20/21 1724 16     Temp 11/20/21 1800 (!) 96.5 F (35.8 C)     Temp src --      SpO2 11/20/21 1719 (!) 72 %     Weight --      Height --      Head Circumference --      Peak Flow --      Pain Score --      Pain Loc --      Pain Edu? --      Excl. in [place]? --     Most recent vital signs: Vitals:   11/20/21 1945 11/20/21 2012  BP: (!) 130/109   Pulse: 86   Resp: 18   Temp: 98.1 F (36.7 C)   SpO2: 94% 95%    General: Resp distress, then apnea CV:  Pulseless Resp:  bagged Abd:  Peritoneal dialysis site noted, not grossly infected    ED Results / Procedures / Treatments   Labs (all labs ordered are listed, but only  abnormal results are displayed) Labs Reviewed  BLOOD GAS, ARTERIAL - Abnormal; Notable for the following components:      Result Value   pH, Arterial 7.29 (*)    pO2, Arterial 170 (*)    Bicarbonate 17.8 (*)    Acid-base deficit 8.1 (*)    All other components within normal limits  LACTIC ACID, PLASMA - Abnormal; Notable for the following components:   Lactic Acid, Venous 7.8 (*)    All other components within normal limits  COMPREHENSIVE METABOLIC PANEL - Abnormal; Notable for the following components:   Sodium 133 (*)    Chloride 96 (*)    CO2 18 (*)    Glucose, Bld 149 (*)    BUN 70 (*)    Creatinine, Ser 15.66 (*)    Total Protein 6.3 (*)    Albumin 2.5 (*)    AST 43 (*)    Alkaline Phosphatase 144 (*)    GFR, Estimated 3 (*)    Anion gap 19 (*)    All other components within normal limits  CBC WITH DIFFERENTIAL/PLATELET - Abnormal; Notable for the following components:  WBC 15.1 (*)    RBC 3.28 (*)    Hemoglobin 9.3 (*)    HCT 30.7 (*)    Neutro Abs 10.3 (*)    Basophils Absolute 0.2 (*)    Abs Immature Granulocytes 0.29 (*)    All other components within normal limits  PROTIME-INR - Abnormal; Notable for the following components:   Prothrombin Time 16.6 (*)    INR 1.4 (*)    All other components within normal limits  URINALYSIS, COMPLETE (UACMP) WITH MICROSCOPIC - Abnormal; Notable for the following components:   Color, Urine STRAW (*)    APPearance CLEAR (*)    Glucose, UA 50 (*)    Protein, ur 100 (*)    All other components within normal limits  POC URINE PREG, ED - Normal  SARS CORONAVIRUS 2 BY RT PCR  CULTURE, BLOOD (ROUTINE X 2)  CULTURE, BLOOD (ROUTINE X 2)  URINE CULTURE  CULTURE, RESPIRATORY W GRAM STAIN  MRSA NEXT GEN BY PCR, NASAL  APTT  LACTIC ACID, PLASMA  URINE DRUG SCREEN, QUALITATIVE ([facility] ONLY)  CBG MONITORING, ED      PROCEDURES:  Critical Care performed: Yes, see critical care procedure note(s)  .Critical Care  Performed by:  Lucillie Garfinkel, MD Authorized by: Lucillie Garfinkel, MD   Critical care provider statement:    Critical care time (minutes):  40   Critical care was necessary to treat or prevent imminent or life-threatening deterioration of the following conditions:  Cardiac failure and circulatory failure   Critical care was time spent personally by me on the following activities:  Development of treatment plan with patient or surrogate, discussions with consultants, evaluation of patient's response to treatment, examination of patient, review of old charts, re-evaluation of patient's condition, ordering and review of radiographic studies, ordering and review of laboratory studies and pulse oximetry   Care discussed with: admitting provider      MEDICATIONS ORDERED IN ED: Medications  propofol (DIPRIVAN) 1000 MG/100ML infusion (70 mcg/kg/min  79.4 kg Intravenous Infusion Verify 11/20/21 1949)  fentaNYL 2531mg in NS 2549m(1036mml) infusion-PREMIX (150 mcg/hr Intravenous Rate/Dose Change 11/20/21 1935)  vancomycin (VANCOREADY) IVPB 1500 mg/300 mL (1,500 mg Intravenous New Bag/Given 11/20/21 2016)  sodium chloride flush (NS) 0.9 % injection 3 mL (has no administration in time range)  sodium chloride flush (NS) 0.9 % injection 3 mL (has no administration in time range)  0.9 %  sodium chloride infusion (has no administration in time range)  acetaminophen (TYLENOL) tablet 650 mg (has no administration in time range)  docusate sodium (COLACE) capsule 100 mg (has no administration in time range)  polyethylene glycol (MIRALAX / GLYCOLAX) packet 17 g (has no administration in time range)  ondansetron (ZOFRAN) injection 4 mg (has no administration in time range)  heparin injection 5,000 Units (has no administration in time range)  pantoprazole (PROTONIX) 2 mg/mL oral suspension 40 mg (40 mg Per Tube Not Given 11/20/21 1830)  pantoprazole (PROTONIX) injection 40 mg (has no administration in time range)  Chlorhexidine Gluconate  Cloth 2 % PADS 6 each (6 each Topical Given 11/20/21 1944)  Oral care mouth rinse (15 mLs Mouth Rinse Given 11/20/21 1959)  Oral care mouth rinse (has no administration in time range)  docusate (COLACE) 50 MG/5ML liquid 100 mg (has no administration in time range)  polyethylene glycol (MIRALAX / GLYCOLAX) packet 17 g (17 g Per Tube Not Given 11/20/21 2018)  fentaNYL (SUBLIMAZE) bolus via infusion 50-100 mcg (has no administration in time range)  0.9 %  sodium chloride infusion (has no administration in time range)  norepinephrine (LEVOPHED) '4mg'$  in 264m (0.016 mg/mL) premix infusion (has no administration in time range)  midazolam (VERSED) injection 2 mg (2 mg Intravenous Given 11/20/21 2013)  EPINEPHrine (ADRENALIN) 1 MG/10ML injection (1 mg Intravenous Given 11/20/21 1723)  calcium chloride injection (1 g Intravenous Given 11/20/21 1729)  ceFEPIme (MAXIPIME) 2 g in sodium chloride 0.9 % 100 mL IVPB (0 g Intravenous Stopped 11/20/21 1830)  furosemide (LASIX) injection 40 mg (40 mg Intravenous Given 11/20/21 1830)  iohexol (OMNIPAQUE) 350 MG/ML injection 50 mL (75 mLs Intravenous Contrast Given 11/20/21 1926)    Consultants:  I spoke with ICU regarding care plan for this patient.   IMPRESSION / MDM / ASSESSMENT AND PLAN / ED COURSE  I reviewed the triage vital signs and the nursing notes.                              Differential diagnosis includes, but is not limited to, cardiac arrest most likely due to respiratory cause.  Patient was in respiratory distress prior to arrival.  Less likely hyperkalemia given EKG without hyperkalemic changes, she was given calcium chloride during ACLS.  Considered ACS but post ROSC EKG did not show STEMI.  Considered pulmonary embolism but bedside ultrasound without findings of right heart strain.  Reported ?infex PD given broad abx after ROSC. Also ordered for 40 iv lasix for potential pulmonary edema. She was ordered for CTA angio chest to eval for cause of  respiratory distress prior to cardiac arrest.   Admit to the ICU.  Patient's presentation is most consistent with acute presentation with potential threat to life or bodily function.       FINAL CLINICAL IMPRESSION(S) / ED DIAGNOSES   Final diagnoses:  Cardiac arrest ([facility]     Rx / DC Orders   ED Discharge Orders     None        Note:  This document was prepared using Dragon voice recognition software and may include unintentional dictation errors.    WLucillie Garfinkel MD 11/20/21 2034    WLucillie Garfinkel MD 11/21/21 1Dunn MD 11/21/21 1239

## 2021-11-20 NOTE — Progress Notes (Signed)
eLink Physician-Brief Progress Note Patient Name: ZAEDA MCFERRAN DOB: 09-26-1980 MRN: [id number]   Date of Service  11/20/2021  HPI/Events of Note  41/F with ESRD on peritoneal dialysis, chronic pain syndrome, pelvic floor dysfunction, presented with worsening of lower extremity edema placed on torsemide recently, presenting to the ED due to severe shortness of breath.  She became unresponsive in the ED, had sudden arrest.  Down time of about 8 minutes x 2 doses of epi.  She is now intubated and sedated.  She is on pressors.  There is also concern for infected PD catheter.  BP 149/89, HR 86, RR 19, O2 sats 95%.   CTA with no PE.  Moderate to large bilateral pleural effusions.   eICU Interventions  Admit to ICU.  Dialysis catheter being inserted for for possible CRRT tonight.  Continue empiric antibiotics.  Follow up cultures.  Heparin for DVT prophylaxis.  PPI for GI prophylaxis.      Intervention Category Evaluation Type: New Patient Evaluation  Elsie Lincoln 11/20/2021, 8:11 PM

## 2021-11-20 NOTE — Progress Notes (Signed)
NP placed line to be used for pressors per bedside RN.

## 2021-11-20 NOTE — H&P (Signed)
Name: Nichole Wiley MRN: [id number] DOB: 1980-10-12     CONSULTATION DATE: 11/20/2021  REFERRING MD :  Jacelyn Grip  CHIEF COMPLAINT:  cardiac arrest   HISTORY OF PRESENT ILLNESS:   On 9/11 41 y.o. female whose medical history is most notable for chronic end-stage renal disease on peritoneal dialysis, as well as's chronic pain syndrome, pelvic floor dysfunction, prior history of opioid addiction,  [name] is her nephrologist.   -She presented 2 days ago with gradual worsening of her extremity swelling.   -This has been going on for a few weeks but it has gotten severe. -was seen in the emergency department a couple weeks ago and was given an extra dose of Lasix.    -followed up with [name] who put her on torsemide-She has been out of it for about a week.    - [name] was concerned about the possibility of a catheter infection and that she has completed an extensive course of antibiotics.    She was supposed to have the peritoneal dialysis catheter removed in favor of a temporary hemodialysis catheter, but that had to be canceled because her fianc just died and she is having the funeral in about 3 days.   9/13 presented to ER with severe SOB, while in ER, patient became unresponsive and had acute sudden cardiac arrest and placed on VENT  PCCM called to evaluate and admit to ICU    ER Course 9/11 seen for  possible infected catheter 9/12 discharged home 9/13 presented to ER with cardiac arrest  No labs WHEN PCCM WAS CALLED TO ADMIT PATIENT  PAST MEDICAL HISTORY :   has a past medical history of Abdominal mass, Arthritis, Chronic kidney disease, Colon polyp, Diabetes 1.5, managed as type 1 ([place]), Drug addiction ([place]), GERD (gastroesophageal reflux disease), Hypertension, Pelvic floor dysfunction, and Urine incontinence.  has a past surgical history that includes Tonsillectomy (1987); Cesarean section (2003); Tubal ligation (2004); Abdominal hysterectomy (2005); Right  oophorectomy (2012); laparoscopy; Lysis of adhesion; Colonoscopy with propofol (N/A, 09/14/2015); polypectomy (09/14/2015); Esophagogastroduodenoscopy (egd) with propofol (N/A, 06/19/2016); Vulvectomy (2011); Cystoscopy w/ ureteral stent placement (Right, 06/06/2017); Cystoscopy w/ ureteral stent placement (Right, 07/06/2017); Cystoscopy w/ retrogrades (Right, 07/06/2017); and TEMPORARY DIALYSIS CATHETER (N/A, 10/01/2021). Prior to Admission medications   Medication Sig Start Date End Date Taking? Authorizing Provider  Accu-Chek Softclix Lancets lancets Use to check blood sugar daily for type 2 diabetes. E11.9 06/27/21   Jearld Fenton, NP  amLODipine (NORVASC) 10 MG tablet Take 1 tablet (10 mg total) by mouth daily. Patient not taking: Reported on 11/18/2021 10/04/21   Fritzi Mandes, MD  aspirin EC 81 MG tablet Take 81 mg by mouth daily.    [provider]  gentamicin cream (GARAMYCIN) 0.1 % Apply 1 Application topically daily. Patient not taking: Reported on 11/18/2021 10/04/21   Fritzi Mandes, MD  glucose blood (ACCU-CHEK GUIDE) test strip 1 each by Other route See admin instructions. 07/08/21   Jearld Fenton, NP  labetalol (NORMODYNE) 100 MG tablet Take 1 tablet (100 mg total) by mouth 3 (three) times daily. Patient not taking: Reported on 11/18/2021 10/03/21   Fritzi Mandes, MD  levothyroxine (SYNTHROID) 25 MCG tablet Take 1 tablet (25 mcg total) by mouth daily. Patient not taking: Reported on 11/18/2021 08/08/21   Jearld Fenton, NP  methadone (DOLOPHINE) 10 MG/ML solution Take 120 mg by mouth daily. Changed by Dr Posey Pronto to get the right home dose w/o sending rx  [provider]  metolazone (ZAROXOLYN) 2.5 MG tablet Take 1 tablet (2.5 mg total) by mouth every other day. 10/29/21   Jearld Fenton, NP  naloxone [facility]) nasal spray 4 mg/0.1 mL One spray in either nostril once for known/suspected opioid overdose. May repeat every 2-3 minutes in alternating nostril til EMS arrives 04/29/21   [provider]  omeprazole (PRILOSEC) 20 MG capsule Take 1 capsule (20 mg total) by mouth daily. 08/07/21   Jearld Fenton, NP  torsemide (DEMADEX) 100 MG tablet Take 1 tablet (100 mg total) by mouth daily. 11/19/21   Hinda Kehr, MD   Allergies  Allergen Reactions   Cephalexin Other (See Comments) and Rash    Unknown   Penicillins Rash    Has patient had a PCN reaction causing immediate rash, facial/tongue/throat swelling, SOB or lightheadedness with hypotension: No Has patient had a PCN reaction causing severe rash involving mucus membranes or skin necrosis: No Has patient had a PCN reaction that required hospitalization: No Has patient had a PCN reaction occurring within the last 10 years: No If all of the above answers are "NO", then may proceed with Cephalosporin use.    FAMILY HISTORY:  family history includes Dementia in her maternal grandmother; Depression in her maternal grandmother and mother; Diabetes in her father, maternal grandmother, and paternal grandmother; Heart disease in her maternal grandmother. SOCIAL HISTORY:  reports that she has been smoking cigarettes. She has a 12.00 pack-year smoking history. She has never used smokeless tobacco. She reports that she does not drink alcohol and does not use drugs.  REVIEW OF SYSTEMS:   Unable to obtain due to critical illness      Estimated body mass index is 27.41 kg/m as calculated from the following:   Height as of 11/18/21: '5\' 7"'$  (1.702 m).   Weight as of 11/18/21: 79.4 kg.    VITAL SIGNS: Pulse Rate:  [31-117] 31 (09/13 1734) Resp:  [16-25] 25 (09/13 1734) BP: (111-142)/(69-93) 142/93 (09/13 1729) SpO2:  [72 %-86 %] 86 % (09/13 1734) FiO2 (%):  [100 %] 100 % (09/13 1734)   No intake/output data recorded. No intake/output data recorded.   SpO2: (!) 86 % FiO2 (%): 100 %   REVIEW OF SYSTEMS  PATIENT IS UNABLE TO PROVIDE COMPLETE REVIEW OF SYSTEMS DUE TO SEVERE CRITICAL ILLNESS     PHYSICAL  EXAMINATION:  GENERAL:critically ill appearing, +resp distress EYES: Pupils equal, round, reactive to light.  No scleral icterus.  MOUTH: Moist mucosal membrane. INTUBATED NECK: Supple.  PULMONARY: +rhonchi, +wheezing CARDIOVASCULAR: S1 and S2.  No murmurs  GASTROINTESTINAL: Soft, nontender, -distended. Positive bowel sounds.  MUSCULOSKELETAL: No swelling, clubbing, or edema.  NEUROLOGIC: obtunded SKIN:intact,warm,dry    I personally reviewed lab work that was obtained in last 24 hrs. CXR Independently reviewed-Acute B/l opacities likely from pulm edema   MEDICATIONS: I have reviewed all medications and confirmed regimen as documented     Indwelling Urinary Catheter continued, requirement due to   Reason to continue Indwelling Urinary Catheter strict Intake/Output monitoring for hemodynamic instability         Ventilator continued, requirement due to severe respiratory failure   Ventilator Sedation RASS 0 to -2      ASSESSMENT AND PLAN SYNOPSIS  41 yo obese white female with ESRD on PD admitted to ICU for acute SOB with sudden cardiac arrest leading to severe hypoxic resp failure likely related to acute pulm edema/ possible ARDS from possible infected PD catheter with underlying  end stage renal disease   Severe ACUTE Hypoxic and Hypercapnic Respiratory Failure -continue Full MV support -continue Bronchodilator Therapy -Wean Fio2 and PEEP as tolerated -will NOT  SAT/SBT  -VAP/VENT bundle implementation CT CHEST R/O PE  ACUTE CARDIAC ARREST- ECHO PENDING -oxygen as needed/Vent suport -follow up cardiac enzymes as indicated   Morbid obesity  Will certainly impact respiratory mechanics, ventilator weaning Suspect will need to consider additional PEEP, possible extubation to BiPAP when appropriate to consider    KIDNEY INJURY/END STAGE Renal Failure -continue Foley Catheter-assess need -Avoid nephrotoxic agents -Follow urine output, BMP -Ensure adequate  renal perfusion, optimize oxygenation -Renal dose medications Recommend VASC CATH and removal of PD catheter All labs pending    NEUROLOGY ACUTE METABOLIC ENCEPHALOPATHY -need for sedation -Goal RASS -2 to -3  CARDIAC ICU monitoring  ID start empiric ABX -continue IV abx as prescibed -follow up cultures Check COVID   GI GI PROPHYLAXIS as indicated  NUTRITIONAL STATUS DIET--> as tolerated Constipation protocol as indicated   ENDO - will use ICU hypoglycemic\Hyperglycemia protocol if needed    ELECTROLYTES -follow labs as needed -replace as needed -pharmacy consultation and following    DVT/GI PRX ordered and assessed TRANSFUSIONS AS NEEDED MONITOR FSBS I Assessed the need for Labs I Assessed the need for Foley I Assessed the need for Central Venous Line Family Discussion when available I Assessed the need for Mobilization I made an Assessment of medications to be adjusted accordingly Safety Risk assessment Completed  CASE DISCUSSED IN MULTIDISCIPLINARY ROUNDS WITH ICU TEAM   Critical Care Time devoted to patient care services described in this note is 75 minutes.   Overall, patient is critically ill, prognosis is guarded.  Patient with Multiorgan failure and at high risk for cardiac arrest and death.    Corrin Parker, M.D.  [facility]  Medical Director [place] Director [facility] Cardio-Pulmonary Department

## 2021-11-20 NOTE — ED Notes (Signed)
Documentation handed off to [place], rn

## 2021-11-20 NOTE — ED Provider Notes (Signed)
Procedure Name: Intubation Date/Time: 11/20/2021 6:19 PM  Performed by: Blake Divine, MDPre-anesthesia Checklist: Patient identified, Patient being monitored, Emergency Drugs available, Timeout performed and Suction available Oxygen Delivery Method: Ambu bag Preoxygenation: Pre-oxygenation with 100% oxygen Ventilation: Mask ventilation without difficulty Laryngoscope Size: Glidescope and 4 Grade View: Grade I Tube size: 7.5 mm Number of attempts: 1 Airway Equipment and Method: Video-laryngoscopy Placement Confirmation: ETT inserted through vocal cords under direct vision, CO2 detector and Breath sounds checked- equal and bilateral Secured at: 24 cm Tube secured with: ETT holder Dental Injury: Teeth and Oropharynx as per pre-operative assessment         Blake Divine, MD 11/20/21 1819

## 2021-11-20 NOTE — Progress Notes (Signed)
Transported PT from ED to CT on Transport Vent with full O2 tank, Ambu-bag and mask with RN. Upon completion of CT, PT was then transported to ICU bed 6. PT stable and tolerated trip well without any occurrences. Placed on Servo Vent with ICU RT and RN at bedside.

## 2021-11-20 NOTE — Procedures (Signed)
Central Venous Catheter Insertion Procedure Note  Nichole Wiley  [id number]  Jan 13, 1981  Date:11/20/21  Time:9:26 PM   Provider Performing:Nichole Wiley A Nichole Wiley   Procedure: Insertion of Non-tunneled Central Venous Catheter(36556) with US guidance ([id number])   Indication(s) Medication administration, Difficult access, and Hemodialysis  Consent Unable to obtain consent due to emergent nature of procedure.  Anesthesia Topical only with 1% lidocaine   Timeout Verified patient identification, verified procedure, site/side was marked, verified correct patient position, special equipment/implants available, medications/allergies/relevant history reviewed, required imaging and test results available.  Sterile Technique Maximal sterile technique including full sterile barrier drape, hand hygiene, sterile gown, sterile gloves, mask, hair covering, sterile ultrasound probe cover (if used).  Procedure Description Area of catheter insertion was cleaned with chlorhexidine and draped in sterile fashion.  With real-time ultrasound guidance a central venous catheter was placed into the right internal jugular vein. Nonpulsatile blood flow and easy flushing noted in all ports.  The catheter was sutured in place and sterile dressing applied.  Complications/Tolerance None; patient tolerated the procedure well. Chest X-ray is ordered to verify placement for internal jugular or subclavian cannulation.   Chest x-ray is not ordered for femoral cannulation.  EBL Minimal  Specimen(s) None   Nichole Falco, DNP, CCRN, FNP-C, AGACNP-BC Acute Care & Family Nurse Practitioner  [facility]  See Amion for personal pager PCCM on call pager [phone] until 7 am

## 2021-11-21 ENCOUNTER — Encounter: Payer: Medicaid Other | Admitting: Internal Medicine

## 2021-11-21 ENCOUNTER — Other Ambulatory Visit: Payer: Self-pay

## 2021-11-21 ENCOUNTER — Encounter: Payer: Self-pay | Admitting: Internal Medicine

## 2021-11-21 LAB — RENAL FUNCTION PANEL
Albumin: 2 g/dL — ABNORMAL LOW (ref 3.5–5.0)
Albumin: 2 g/dL — ABNORMAL LOW (ref 3.5–5.0)
Anion gap: 12 (ref 5–15)
Anion gap: 14 (ref 5–15)
BUN: 64 mg/dL — ABNORMAL HIGH (ref 6–20)
BUN: 47 mg/dL — ABNORMAL HIGH (ref 6–20)
CO2: 22 mmol/L (ref 22–32)
CO2: 22 mmol/L (ref 22–32)
Calcium: 8 mg/dL — ABNORMAL LOW (ref 8.9–10.3)
Calcium: 8.1 mg/dL — ABNORMAL LOW (ref 8.9–10.3)
Chloride: 99 mmol/L (ref 98–111)
Chloride: 100 mmol/L (ref 98–111)
Creatinine, Ser: 12.72 mg/dL — ABNORMAL HIGH (ref 0.44–1.00)
Creatinine, Ser: 9.45 mg/dL — ABNORMAL HIGH (ref 0.44–1.00)
GFR, Estimated: 3 mL/min — ABNORMAL LOW (ref >60)
GFR, Estimated: 5 mL/min — ABNORMAL LOW (ref >60)
Glucose, Bld: 79 mg/dL (ref 70–99)
Glucose, Bld: 78 mg/dL (ref 70–99)
Phosphorus: 7.3 mg/dL — ABNORMAL HIGH (ref 2.5–4.6)
Phosphorus: 5.3 mg/dL — ABNORMAL HIGH (ref 2.5–4.6)
Potassium: 4.3 mmol/L (ref 3.5–5.1)
Potassium: 3.5 mmol/L (ref 3.5–5.1)
Sodium: 133 mmol/L — ABNORMAL LOW (ref 135–145)
Sodium: 136 mmol/L (ref 135–145)

## 2021-11-21 LAB — CBC WITH DIFFERENTIAL/PLATELET
Abs Immature Granulocytes: 0.06 10*3/uL (ref 0.00–0.07)
Basophils Absolute: 0.1 10*3/uL (ref 0.0–0.1)
Basophils Relative: 1 %
Eosinophils Absolute: 0.1 10*3/uL (ref 0.0–0.5)
Eosinophils Relative: 1 %
HCT: 22.6 % — ABNORMAL LOW (ref 36.0–46.0)
Hemoglobin: 7.1 g/dL — ABNORMAL LOW (ref 12.0–15.0)
Immature Granulocytes: 1 %
Lymphocytes Relative: 9 %
Lymphs Abs: 1.1 10*3/uL (ref 0.7–4.0)
MCH: 27.8 pg (ref 26.0–34.0)
MCHC: 31.4 g/dL (ref 30.0–36.0)
MCV: 88.6 fL (ref 80.0–100.0)
Monocytes Absolute: 0.5 10*3/uL (ref 0.1–1.0)
Monocytes Relative: 4 %
Neutro Abs: 10.4 10*3/uL — ABNORMAL HIGH (ref 1.7–7.7)
Neutrophils Relative %: 84 %
Platelets: 239 10*3/uL (ref 150–400)
RBC: 2.55 MIL/uL — ABNORMAL LOW (ref 3.87–5.11)
RDW: 14.7 % (ref 11.5–15.5)
WBC: 12.3 10*3/uL — ABNORMAL HIGH (ref 4.0–10.5)
nRBC: 0 % (ref 0.0–0.2)

## 2021-11-21 LAB — PROCALCITONIN: Procalcitonin: 1.49 ng/mL

## 2021-11-21 LAB — VANCOMYCIN, RANDOM: Vancomycin Rm: 33 ug/mL

## 2021-11-21 LAB — HEPATITIS C ANTIBODY: HCV Ab: NONREACTIVE

## 2021-11-21 LAB — HEPATITIS B SURFACE ANTIGEN: Hepatitis B Surface Ag: NONREACTIVE

## 2021-11-21 LAB — BLOOD GAS, ARTERIAL
Acid-base deficit: 8.1 mmol/L — ABNORMAL HIGH (ref 0.0–2.0)
Bicarbonate: 17.8 mmol/L — ABNORMAL LOW (ref 20.0–28.0)
O2 Saturation: 99.2 %
Patient temperature: 37
Spontaneous VT: 450 mL
pCO2 arterial: 37 mmHg (ref 32–48)
pH, Arterial: 7.29 — ABNORMAL LOW (ref 7.35–7.45)
pO2, Arterial: 170 mmHg — ABNORMAL HIGH (ref 83–108)

## 2021-11-21 LAB — MAGNESIUM: Magnesium: 2 mg/dL (ref 1.7–2.4)

## 2021-11-21 LAB — GLUCOSE, CAPILLARY
Glucose-Capillary: 107 mg/dL — ABNORMAL HIGH (ref 70–99)
Glucose-Capillary: 110 mg/dL — ABNORMAL HIGH (ref 70–99)
Glucose-Capillary: 131 mg/dL — ABNORMAL HIGH (ref 70–99)
Glucose-Capillary: 145 mg/dL — ABNORMAL HIGH (ref 70–99)
Glucose-Capillary: 173 mg/dL — ABNORMAL HIGH (ref 70–99)
Glucose-Capillary: 30 mg/dL — CL (ref 70–99)
Glucose-Capillary: 45 mg/dL — ABNORMAL LOW (ref 70–99)
Glucose-Capillary: 60 mg/dL — ABNORMAL LOW (ref 70–99)
Glucose-Capillary: 69 mg/dL — ABNORMAL LOW (ref 70–99)
Glucose-Capillary: 74 mg/dL (ref 70–99)
Glucose-Capillary: 80 mg/dL (ref 70–99)
Glucose-Capillary: 85 mg/dL (ref 70–99)
Glucose-Capillary: 85 mg/dL (ref 70–99)
Glucose-Capillary: 85 mg/dL (ref 70–99)
Glucose-Capillary: 85 mg/dL (ref 70–99)
Glucose-Capillary: 88 mg/dL (ref 70–99)

## 2021-11-21 LAB — HEMOGLOBIN AND HEMATOCRIT, BLOOD
HCT: 23.3 % — ABNORMAL LOW (ref 36.0–46.0)
Hemoglobin: 7.4 g/dL — ABNORMAL LOW (ref 12.0–15.0)

## 2021-11-21 LAB — HEPATITIS B CORE ANTIBODY, TOTAL: Hep B Core Total Ab: NONREACTIVE

## 2021-11-21 LAB — TROPONIN I (HIGH SENSITIVITY)
Troponin I (High Sensitivity): 343 ng/L (ref <18)
Troponin I (High Sensitivity): 433 ng/L (ref <18)
Troponin I (High Sensitivity): 532 ng/L (ref <18)
Troponin I (High Sensitivity): 346 ng/L (ref <18)

## 2021-11-21 MED ORDER — DEXTROSE 50 % IV SOLN: 25.0000 g | INTRAVENOUS | Status: AC

## 2021-11-21 MED ORDER — SODIUM CHLORIDE 0.9 % IV SOLN
2.0000 g | Freq: Two times a day (BID) | INTRAVENOUS | Status: AC
  Administered 2021-11-21 (×2): 2 g via INTRAVENOUS
  Filled 2021-11-21 (×2): qty 12.5

## 2021-11-21 MED ORDER — ADULT MULTIVITAMIN LIQUID CH
15.0000 mL | Freq: Every day | ORAL | Status: DC
  Administered 2021-11-22 – 2021-11-23 (×2): 15 mL
  Filled 2021-11-21 (×4): qty 15

## 2021-11-21 MED ORDER — FREE WATER
20.0000 mL | Freq: Four times a day (QID) | Status: DC
  Administered 2021-11-21 – 2021-11-23 (×9): 20 mL

## 2021-11-21 MED ORDER — DEXTROSE 50 % IV SOLN
INTRAVENOUS | Status: AC
  Administered 2021-11-21: 12.5 g via INTRAVENOUS
  Filled 2021-11-21: qty 50

## 2021-11-21 MED ORDER — RENA-VITE PO TABS
1.0000 | ORAL_TABLET | Freq: Every day | ORAL | Status: DC
  Administered 2021-11-21 – 2021-11-28 (×7): 1
  Filled 2021-11-21 (×7): qty 1

## 2021-11-21 MED ORDER — DEXTROSE 50 % IV SOLN
INTRAVENOUS | Status: AC
  Filled 2021-11-21: qty 50

## 2021-11-21 MED ORDER — DEXTROSE 10 % IV SOLN: INTRAVENOUS | Status: DC

## 2021-11-21 MED ORDER — PROSOURCE TF20 ENFIT COMPATIBL EN LIQD
60.0000 mL | Freq: Four times a day (QID) | ENTERAL | Status: AC
  Administered 2021-11-21 (×3): 60 mL

## 2021-11-21 MED ORDER — VITAL HIGH PROTEIN PO LIQD
1000.0000 mL | ORAL | Status: AC
  Administered 2021-11-21: 1000 mL

## 2021-11-21 MED ORDER — DEXTROSE-NACL 5-0.45 % IV SOLN: INTRAVENOUS | Status: DC

## 2021-11-21 MED ORDER — DEXTROSE 50 % IV SOLN
25.0000 g | INTRAVENOUS | Status: AC
  Administered 2021-11-21: 25 g via INTRAVENOUS

## 2021-11-21 MED ORDER — DEXTROSE 50 % IV SOLN: 12.5000 g | INTRAVENOUS | Status: AC

## 2021-11-21 MED ORDER — DEXTROSE 50 % IV SOLN
INTRAVENOUS | Status: AC
  Administered 2021-11-21: 25 g via INTRAVENOUS
  Filled 2021-11-21: qty 50

## 2021-11-21 MED ORDER — VANCOMYCIN VARIABLE DOSE PER UNSTABLE RENAL FUNCTION (PHARMACIST DOSING): Status: DC

## 2021-11-21 NOTE — Progress Notes (Signed)
Inpatient Diabetes Program Recommendations  AACE/ADA: New Consensus Statement on Inpatient Glycemic Control (2015)  Target Ranges:  Prepandial:   less than 140 mg/dL      Peak postprandial:   less than 180 mg/dL (1-2 hours)      Critically ill patients:  140 - 180 mg/dL   Lab Results  Component Value Date   GLUCAP 110 (H) 11/21/2021   HGBA1C 4.8 10/01/2021    Review of Glycemic Control  Latest Reference Range & Units 11/21/21 04:23 11/21/21 07:13 11/21/21 07:39  Glucose-Capillary 70 - 99 mg/dL 85 45 (L) 110 (H)   Diabetes history: DM 1.5- however patient is on no insulin at home and no medications for DM ordered Current orders for Inpatient glycemic control:  None- CBG's low   Inpatient Diabetes Program Recommendations:    Will follow.  Thanks,  Adah Perl, RN, BC-ADM Inpatient Diabetes Coordinator Pager [phone]  (8a-5p)

## 2021-11-21 NOTE — TOC Initial Note (Signed)
Transition of Care [facility]) - Initial/Assessment Note    Patient Details  Name: Nichole Wiley MRN: [id number] Date of Birth: 14-Aug-1980  Transition of Care [facility]) CM/SW Contact:    Shelbie Hutching, RN Phone Number: 11/21/2021, 2:38 PM  Clinical Narrative:                 Patient admitted to the ICU after cardiac arrest.  Patient is ESRD and type 1 diabetic.  She does peritoneal dialysis at home, her mom says that she does dialysis all day for about 20 hours a day.  Patient has been on dialysis for about 2 years.  RNCM was able to speak with patient's mother via phone, patient is intubated and sedated currently on CRRT.  Patient has been staying with her mother or her mother stays with her at her apartment.  Mother is renting a room from a friend and the patient does not have any furniture right now. Mother provides transportation.   Patient's boyfriend just died from an overdose, he thought he was taking xanax but turned out to be fentanyl, the funeral was today.  Mother reports that they have been working on getting the patient away from the boyfriend and that he did not know where her apartment was.    Mother says that they could use a wheelchair at home, informed her that a lightweight manuel chair could be ordered at discharge.  Mother also asked about in home care- told her to follow up with the patient's PCP for [facility] services through [facility].    TOC will cont to follow.  Expected Discharge Plan:  (TBD) Barriers to Discharge: Continued Medical Work up   Patient Goals and CMS Choice        Expected Discharge Plan and Services Expected Discharge Plan:  (TBD)   Discharge Planning Services: CM Consult   Living arrangements for the past 2 months: Apartment                 DME Arranged: Youth worker wheelchair with seat cushion DME Agency: [employer]                  Prior Living Arrangements/Services Living arrangements for the past 2 months: Apartment Lives  with:: Self, Parents Patient language and need for interpreter reviewed:: Yes        Need for Family Participation in Patient Care: Yes (Comment) Care giver support system in place?: Yes (comment) (mother)   Criminal Activity/Legal Involvement Pertinent to Current Situation/Hospitalization: No - Comment as needed  Activities of Daily Living Home Assistive Devices/Equipment: Environmental consultant (specify type) ADL Screening (condition at time of admission) Patient's cognitive ability adequate to safely complete daily activities?: No Is the patient deaf or have difficulty hearing?: No Does the patient have difficulty seeing, even when wearing glasses/contacts?: No Does the patient have difficulty concentrating, remembering, or making decisions?: Yes Patient able to express need for assistance with ADLs?: Yes Does the patient have difficulty dressing or bathing?: Yes Independently performs ADLs?: No Communication: Independent Dressing (OT): Needs assistance Is this a change from baseline?: Change from baseline, expected to last >3 days Grooming: Needs assistance Is this a change from baseline?: Change from baseline, expected to last >3 days Feeding: Independent Bathing: Independent Toileting: Independent In/Out Bed: Independent Walks in Home: Needs assistance Is this a change from baseline?: Change from baseline, expected to last >3 days Does the patient have difficulty walking or climbing stairs?: Yes Weakness of Legs: Both Weakness of Arms/Hands: Both  Permission Sought/Granted Permission sought to share information with : Family Supports    Share Information with NAME: Clarisse Gouge     Permission granted to share info w Relationship: mother  Permission granted to share info w Contact Information: [phone]  Emotional Assessment   Attitude/Demeanor/Rapport: Intubated (Following Commands or Not Following Commands) Affect (typically observed): Unable to Assess   Alcohol / Substance  Use: Not Applicable Psych Involvement: No (comment)  Admission diagnosis:  Cardiac arrest [facility]) [I46.9] Patient Active Problem List   Diagnosis Date Noted   Cardiac arrest ([place]) 11/20/2021   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   Type 2 diabetes mellitus with ESRD (end-stage renal disease) ([place]) 08/10/2021   Nicotine dependence 08/08/2021   Hypothyroidism 08/08/2021   Depression with anxiety 08/08/2021   HTN (hypertension) 06/27/2021   GERD (gastroesophageal reflux disease) 06/27/2021   Osteoarthritis 06/27/2021   ESRD (end stage renal disease) ([place]) 06/27/2021   Anemia of chronic renal failure, stage 5 ([place]) 06/27/2021   HLD (hyperlipidemia) 06/27/2021   Overweight with body mass index (BMI) of 28 to 28.9 in adult 06/27/2021   Type 1 diabetes mellitus without complication ([place]) 71/69/6789   Chronic constipation    PCP:  Care, [place] Primary Pharmacy:   [facility] [place], [address] AT [address] [place] [address] Phone: [phone] Fax: [phone]     Social Determinants of Health (SDOH) Interventions Food Insecurity Interventions: Intervention Not Indicated  Readmission Risk Interventions    11/21/2021    2:37 PM  Readmission Risk Prevention Plan  Transportation Screening Complete  Medication Review (RN Care Manager) Referral to Pharmacy  PCP or Specialist appointment within 3-5 days of discharge Complete  HRI or [place] Complete  SW Recovery Care/Counseling Consult Complete  [place] Not Applicable

## 2021-11-21 NOTE — Progress Notes (Signed)
Patient responsive to voice, on ventilator, in NSR. Patient is able to follow commands. CRRT discontinued this afternoon per MD. Patient tolerated CRRT well, pressures stable. Hypoglycemia protocol started with increased blood glucose checks. Patient started on tube feeds, tolerating well.

## 2021-11-21 NOTE — IPAL (Signed)
  Interdisciplinary Goals of Care Family Meeting   Date carried out: 11/21/2021  Location of the meeting: Phone conference  Member's involved: Physician and Family Member or next of kin    GOALS OF CARE DISCUSSION  The Clinical status was relayed to family in detail-Mother [place]  Updated and notified of patients medical condition- Severe resp distress with low oxygen levels leading to heart attack Lots of fluids in her lungs Infected PD catheter High risk for cardiac arrest On continuous dialysis  Patient remains unresponsive and will not open eyes to command.   Patient is having a weak cough and struggling to remove secretions.   Patient with increased WOB and using accessory muscles to breathe Explained to family course of therapy and the modalities   PATIENT REMAINS FULL CODE  Family understands the situation.   Family are satisfied with Plan of action and management. All questions answered  Additional CC time 29 mins   Tito Ausmus Patricia Pesa, M.D.  [facility]  Medical Director [place] Director [facility] Cardio-Pulmonary Department

## 2021-11-21 NOTE — Progress Notes (Signed)
Initial Nutrition Assessment  DOCUMENTATION CODES:   Obesity unspecified  INTERVENTION:   Vital HP '@20ml'$ /hr + ProSource TF 20- Give 46m QID via tube  Propofol: 33.3 ml/hr- provides 879kcal/day   Free water flushes 297mq6 hours to maintain tube patency   Regimen provides 1679kcal/day, 122g/day protein and 48140may of free water.   Rena-vit daily via tube  Liquid MVI daily via tube   Pt at moderate refeed risk; recommend monitor potassium, magnesium and phosphorus labs daily until stable  NUTRITION DIAGNOSIS:   Inadequate oral intake related to inability to eat (pt sedated and ventilated) as evidenced by NPO status.  GOAL:   Provide needs based on ASPEN/SCCM guidelines  MONITOR:   Vent status, Labs, Weight trends, TF tolerance, Skin, I & O's  REASON FOR ASSESSMENT:   Ventilator    ASSESSMENT:   [age]o female with h/o ESRD on PD (since 2021), HTN, HLD, Type I DM (diagnosed at age [age]hypothyroidism, depression, anxiety, GERD, chronic constipation, opiod dependence and ureterolithiasis who is admitted with cardiac arrest and possible PD catheter infection.  Pt sedated and ventilated. OGT in place. Plan is to initiate tube feeds today. Pt currently receiving CRRT with plans for possible transition to HD in the next 24hrs. Surgery evaluating PD catheter for possible removal. Per chart, pt appears to have weight gain pta. Pt with severe edema and volume overload noted. Per chair, pt is wheelchair bound currently r/t weakness and severe edema.   Medications reviewed and include: colace, heparin, protonix, miralax, propofol   Labs reviewed: Na 133(L), BUN 64(H), creat 12.72(H), P 7.3(H), Mg 2.0 wnl Wbc- 12.3(H), Hgb 7.1(L), Hct 22.6(L) Cbgs- 110, 45, 85, 145 x 24 hrs AIC 4.8- 7/25  Patient is currently intubated on ventilator support MV: 8.0 L/min Temp (24hrs), Avg:97.2 F (36.2 C), Min:96.1 F (35.6 C), Max:98.4 F (36.9 C)  Propofol: 33.3 ml/hr- provides  879kcal/day   MAP- >85m88m  UOP- 330ml77mUTRITION - FOCUSED PHYSICAL EXAM:  Flowsheet Row Most Recent Value  Orbital Region No depletion  Upper Arm Region No depletion  Thoracic and Lumbar Region No depletion  Buccal Region No depletion  Temple Region No depletion  Clavicle Bone Region No depletion  Clavicle and Acromion Bone Region No depletion  Scapular Bone Region No depletion  Dorsal Hand No depletion  Patellar Region No depletion  Anterior Thigh Region No depletion  Posterior Calf Region No depletion  Edema (RD Assessment) Severe  Hair Reviewed  Eyes Reviewed  Mouth Reviewed  Skin Reviewed  Nails Reviewed   Diet Order:   Diet Order             Diet NPO time specified  Diet effective now                  EDUCATION NEEDS:   No education needs have been identified at this time  Skin:  Skin Assessment: Reviewed RN Assessment  Last BM:  pta  Height:   Ht Readings from Last 1 Encounters:  11/20/21 '5\' 7"'$  (1.702 m)    Weight:   Wt Readings from Last 1 Encounters:  11/21/21 92.4 kg    Ideal Body Weight:  61.3 kg  BMI:  Body mass index is 31.9 kg/m.  Estimated Nutritional Needs:   Kcal:  1016-1293kcal/day  Protein:  >125g/day  Fluid:  UOP +1L  CaseyKoleen DistanceRD, LDN Please refer to [id number] and/or RD on-call/weekend/after hours pager

## 2021-11-21 NOTE — H&P (View-Only) (Signed)
Subjective:   CC: infected PD cath  HPI:  Nichole Wiley is a 41 y.o. female who was consulted by [facility] for issue above.  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  Treated with abx as an outpt, but came in with respiratory distress, cardiac arrest, now in ICU, vent, CRRT.  Concern for infection as cause of above so surgery consulted for possible removal.   Past Medical History:  has a past medical history of Abdominal mass, Arthritis, Chronic kidney disease, Colon polyp, Diabetes 1.5, managed as type 1 ([place]), Drug addiction ([place]), GERD (gastroesophageal reflux disease), Hypertension, Pelvic floor dysfunction, and Urine incontinence.  Past Surgical History:  Past Surgical History:  Procedure Laterality Date   ABDOMINAL HYSTERECTOMY  2005   CESAREAN SECTION  2003   COLONOSCOPY WITH PROPOFOL N/A 09/14/2015   Procedure: COLONOSCOPY WITH PROPOFOL;  Surgeon: Lucilla Lame, MD;  Location: [place];  Service: Endoscopy;  Laterality: N/A;  Diabetic - insulin   CYSTOSCOPY W/ RETROGRADES Right 07/06/2017   Procedure: CYSTOSCOPY WITH RETROGRADE PYELOGRAM;  Surgeon: Hollice Espy, MD;  Location: [facility];  Service: Urology;  Laterality: Right;   CYSTOSCOPY W/ URETERAL STENT PLACEMENT Right 06/06/2017   Procedure: CYSTOSCOPY WITH RETROGRADE PYELOGRAM/URETERAL STENT PLACEMENT;  Surgeon: Lucas Mallow, MD;  Location: [facility];  Service: Urology;  Laterality: Right;   CYSTOSCOPY W/ URETERAL STENT PLACEMENT Right 07/06/2017   Procedure: CYSTOSCOPY WITH STENT REPLACEMENT;  Surgeon: Hollice Espy, MD;  Location: [facility];  Service: Urology;  Laterality: Right;   ESOPHAGOGASTRODUODENOSCOPY (EGD) WITH PROPOFOL N/A 06/19/2016   Procedure: ESOPHAGOGASTRODUODENOSCOPY (EGD) WITH PROPOFOL;  Surgeon: Jonathon Bellows, MD;  Location: [facility];  Service: Endoscopy;  Laterality: N/A;   LAPAROSCOPY     LYSIS OF ADHESION     POLYPECTOMY  09/14/2015   Procedure:  POLYPECTOMY;  Surgeon: Lucilla Lame, MD;  Location: [place];  Service: Endoscopy;;   RIGHT OOPHORECTOMY  2012   TEMPORARY DIALYSIS CATHETER N/A 10/01/2021   Procedure: TEMPORARY DIALYSIS CATHETER;  Surgeon: Algernon Huxley, MD;  Location: [place] [facility];  Service: Cardiovascular;  Laterality: N/A;   TONSILLECTOMY  1987   and adenoids   TUBAL LIGATION  2004   VULVECTOMY  2011   lipoma    Family History: family history includes Dementia in her maternal grandmother; Depression in her maternal grandmother and mother; Diabetes in her father, maternal grandmother, and paternal grandmother; Heart disease in her maternal grandmother.  Social History: per chart review,  reports that she has been smoking cigarettes. She has a 12.00 pack-year smoking history. She has never used smokeless tobacco. She reports that she does not drink alcohol and does not use drugs.  Current Medications:  Prior to Admission medications   Medication Sig Start Date End Date Taking? Authorizing Provider  aspirin EC 81 MG tablet Take 81 mg by mouth daily.   Yes [provider]  metolazone (ZAROXOLYN) 2.5 MG tablet Take 1 tablet (2.5 mg total) by mouth every other day. 10/29/21  Yes Jearld Fenton, NP  omeprazole (PRILOSEC) 20 MG capsule Take 1 capsule (20 mg total) by mouth daily. 08/07/21  Yes Jearld Fenton, NP  torsemide (DEMADEX) 100 MG tablet Take 1 tablet (100 mg total) by mouth daily. 11/19/21  Yes Hinda Kehr, MD  Accu-Chek Softclix Lancets lancets Use to check blood sugar daily for type 2 diabetes. E11.9 06/27/21   Jearld Fenton, NP  amLODipine (NORVASC) 10 MG  tablet Take 1 tablet (10 mg total) by mouth daily. Patient not taking: Reported on 11/18/2021 10/04/21   Fritzi Mandes, MD  gentamicin cream (GARAMYCIN) 0.1 % Apply 1 Application topically daily. Patient not taking: Reported on 11/18/2021 10/04/21   Fritzi Mandes, MD  glucose blood (ACCU-CHEK GUIDE) test strip 1 each by Other route See admin  instructions. 07/08/21   Jearld Fenton, NP  labetalol (NORMODYNE) 100 MG tablet Take 1 tablet (100 mg total) by mouth 3 (three) times daily. Patient not taking: Reported on 11/18/2021 10/03/21   Fritzi Mandes, MD  levothyroxine (SYNTHROID) 25 MCG tablet Take 1 tablet (25 mcg total) by mouth daily. Patient not taking: Reported on 11/18/2021 08/08/21   Jearld Fenton, NP  methadone (DOLOPHINE) 10 MG/ML solution Take 120 mg by mouth daily. Changed by Dr Posey Pronto to get the right home dose w/o sending rx Patient not taking: Reported on 11/20/2021    [provider]  naloxone [facility]) nasal spray 4 mg/0.1 mL One spray in either nostril once for known/suspected opioid overdose. May repeat every 2-3 minutes in alternating nostril til EMS arrives 04/29/21   [provider]    Allergies:  Allergies as of 11/20/2021 - Review Complete 11/20/2021  Allergen Reaction Noted   Cephalexin Other (See Comments) and Rash 08/29/2015   Penicillins Rash 08/29/2015    ROS:  Unable to obtain secondary to pt status   Objective:     BP 109/60   Pulse 67   Temp (!) 97.3 F (36.3 C) (Bladder)   Resp 18   Ht '5\' 7"'$  (1.702 m)   Wt 92.4 kg   SpO2 96%   BMI 31.90 kg/m   Constitutional :  Intubated and sedated  Lymphatics/Throat:  no asymmetry, masses, or scars  Respiratory:  clear to auscultation bilaterally  Cardiovascular:  regular rate and rhythm  Gastrointestinal: Soft, no guarding .   Musculoskeletal: Steady movement  Skin: Cool and moist, PD cath in place   Psychiatric: sedated       LABS:     Latest Ref Rng & Units 11/21/2021    4:13 AM 11/20/2021    9:55 PM 11/20/2021    5:35 PM  CMP  Glucose 70 - 99 mg/dL 79  151  149   BUN 6 - 20 mg/dL 64  73  70   Creatinine 0.44 - 1.00 mg/dL 12.72  16.14  15.66   Sodium 135 - 145 mmol/L 133  130  133   Potassium 3.5 - 5.1 mmol/L 4.3  5.7  5.1   Chloride 98 - 111 mmol/L 99  93  96   CO2 22 - 32 mmol/L '22  21  18   '$ Calcium 8.9 - 10.3 mg/dL 8.0   7.8  10.1   Total Protein 6.5 - 8.1 g/dL   6.3   Total Bilirubin 0.3 - 1.2 mg/dL   0.3   Alkaline Phos 38 - 126 U/L   144   AST 15 - 41 U/L   43   ALT 0 - 44 U/L   23       Latest Ref Rng & Units 11/21/2021    2:28 AM 11/20/2021    5:35 PM 11/18/2021    7:49 PM  CBC  WBC 4.0 - 10.5 K/uL 12.3  15.1  10.5   Hemoglobin 12.0 - 15.0 g/dL 7.1  9.3  8.8   Hematocrit 36.0 - 46.0 % 22.6  30.7  27.7   Platelets 150 - 400 K/uL  239  357  258     RADS: N/a Assessment:   Infected PD cath  Plan:   Reviewed chart and previous op note for PD cath insertion done over at [facility].  The intra-abdominal portion of the catheter reportedly was sutured to the abdominal wall.  Due to the suture, there is a possibility this PD cath will not be able to be removed without visualizing the intra-abdominal portion.  Unfortunately, her current clinical status makes it too high risk to proceed with general anesthesia in order to obtain visualization within the abdomen.    After discussion with the mother who is listed as next of kin, I recommended we proceed with removal of the tunneled portion under local, and attempt at removal of the intra-abdominal portion of the catheter with gentle traction without making any deeper incisions that will require more than local anesthesia.  If the intra-abdominal catheter portion is unable to be pulled out, we can  consider removal of that portion at a later time when she has recovered from her recent PEA, decreasing her chance of perioperative complications undergoing general anesthesia.  The patient mother verbalized understanding and all questions were answered to the patient's satisfaction.  Tentatively plan to proceed with surgery tomorrow after completion of CRRT and anesthesia evaluation.  Procedure will be performed in the OR for better lighting and equipment, and also with anesthesia monitoring.  labs/images/medications/previous chart entries reviewed personally and  relevant changes/updates noted above.

## 2021-11-21 NOTE — Consult Note (Signed)
Subjective:   CC: infected PD cath  HPI:  Nichole Wiley is a 41 y.o. female who was consulted by [facility] for issue above.  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  Treated with abx as an outpt, but came in with respiratory distress, cardiac arrest, now in ICU, vent, CRRT.  Concern for infection as cause of above so surgery consulted for possible removal.   Past Medical History:  has a past medical history of Abdominal mass, Arthritis, Chronic kidney disease, Colon polyp, Diabetes 1.5, managed as type 1 ([place]), Drug addiction ([place]), GERD (gastroesophageal reflux disease), Hypertension, Pelvic floor dysfunction, and Urine incontinence.  Past Surgical History:  Past Surgical History:  Procedure Laterality Date   ABDOMINAL HYSTERECTOMY  2005   CESAREAN SECTION  2003   COLONOSCOPY WITH PROPOFOL N/A 09/14/2015   Procedure: COLONOSCOPY WITH PROPOFOL;  Surgeon: Lucilla Lame, MD;  Location: [place];  Service: Endoscopy;  Laterality: N/A;  Diabetic - insulin   CYSTOSCOPY W/ RETROGRADES Right 07/06/2017   Procedure: CYSTOSCOPY WITH RETROGRADE PYELOGRAM;  Surgeon: Hollice Espy, MD;  Location: [facility];  Service: Urology;  Laterality: Right;   CYSTOSCOPY W/ URETERAL STENT PLACEMENT Right 06/06/2017   Procedure: CYSTOSCOPY WITH RETROGRADE PYELOGRAM/URETERAL STENT PLACEMENT;  Surgeon: Lucas Mallow, MD;  Location: [facility];  Service: Urology;  Laterality: Right;   CYSTOSCOPY W/ URETERAL STENT PLACEMENT Right 07/06/2017   Procedure: CYSTOSCOPY WITH STENT REPLACEMENT;  Surgeon: Hollice Espy, MD;  Location: [facility];  Service: Urology;  Laterality: Right;   ESOPHAGOGASTRODUODENOSCOPY (EGD) WITH PROPOFOL N/A 06/19/2016   Procedure: ESOPHAGOGASTRODUODENOSCOPY (EGD) WITH PROPOFOL;  Surgeon: Jonathon Bellows, MD;  Location: [facility];  Service: Endoscopy;  Laterality: N/A;   LAPAROSCOPY     LYSIS OF ADHESION     POLYPECTOMY  09/14/2015   Procedure:  POLYPECTOMY;  Surgeon: Lucilla Lame, MD;  Location: [place];  Service: Endoscopy;;   RIGHT OOPHORECTOMY  2012   TEMPORARY DIALYSIS CATHETER N/A 10/01/2021   Procedure: TEMPORARY DIALYSIS CATHETER;  Surgeon: Algernon Huxley, MD;  Location: [place] [facility];  Service: Cardiovascular;  Laterality: N/A;   TONSILLECTOMY  1987   and adenoids   TUBAL LIGATION  2004   VULVECTOMY  2011   lipoma    Family History: family history includes Dementia in her maternal grandmother; Depression in her maternal grandmother and mother; Diabetes in her father, maternal grandmother, and paternal grandmother; Heart disease in her maternal grandmother.  Social History: per chart review,  reports that she has been smoking cigarettes. She has a 12.00 pack-year smoking history. She has never used smokeless tobacco. She reports that she does not drink alcohol and does not use drugs.  Current Medications:  Prior to Admission medications   Medication Sig Start Date End Date Taking? Authorizing Provider  aspirin EC 81 MG tablet Take 81 mg by mouth daily.   Yes [provider]  metolazone (ZAROXOLYN) 2.5 MG tablet Take 1 tablet (2.5 mg total) by mouth every other day. 10/29/21  Yes Jearld Fenton, NP  omeprazole (PRILOSEC) 20 MG capsule Take 1 capsule (20 mg total) by mouth daily. 08/07/21  Yes Jearld Fenton, NP  torsemide (DEMADEX) 100 MG tablet Take 1 tablet (100 mg total) by mouth daily. 11/19/21  Yes Hinda Kehr, MD  Accu-Chek Softclix Lancets lancets Use to check blood sugar daily for type 2 diabetes. E11.9 06/27/21   Jearld Fenton, NP  amLODipine (NORVASC) 10 MG  tablet Take 1 tablet (10 mg total) by mouth daily. Patient not taking: Reported on 11/18/2021 10/04/21   Fritzi Mandes, MD  gentamicin cream (GARAMYCIN) 0.1 % Apply 1 Application topically daily. Patient not taking: Reported on 11/18/2021 10/04/21   Fritzi Mandes, MD  glucose blood (ACCU-CHEK GUIDE) test strip 1 each by Other route See admin  instructions. 07/08/21   Jearld Fenton, NP  labetalol (NORMODYNE) 100 MG tablet Take 1 tablet (100 mg total) by mouth 3 (three) times daily. Patient not taking: Reported on 11/18/2021 10/03/21   Fritzi Mandes, MD  levothyroxine (SYNTHROID) 25 MCG tablet Take 1 tablet (25 mcg total) by mouth daily. Patient not taking: Reported on 11/18/2021 08/08/21   Jearld Fenton, NP  methadone (DOLOPHINE) 10 MG/ML solution Take 120 mg by mouth daily. Changed by Dr Posey Pronto to get the right home dose w/o sending rx Patient not taking: Reported on 11/20/2021    [provider]  naloxone [facility]) nasal spray 4 mg/0.1 mL One spray in either nostril once for known/suspected opioid overdose. May repeat every 2-3 minutes in alternating nostril til EMS arrives 04/29/21   [provider]    Allergies:  Allergies as of 11/20/2021 - Review Complete 11/20/2021  Allergen Reaction Noted   Cephalexin Other (See Comments) and Rash 08/29/2015   Penicillins Rash 08/29/2015    ROS:  Unable to obtain secondary to pt status   Objective:     BP 109/60   Pulse 67   Temp (!) 97.3 F (36.3 C) (Bladder)   Resp 18   Ht '5\' 7"'$  (1.702 m)   Wt 92.4 kg   SpO2 96%   BMI 31.90 kg/m   Constitutional :  Intubated and sedated  Lymphatics/Throat:  no asymmetry, masses, or scars  Respiratory:  clear to auscultation bilaterally  Cardiovascular:  regular rate and rhythm  Gastrointestinal: Soft, no guarding .   Musculoskeletal: Steady movement  Skin: Cool and moist, PD cath in place   Psychiatric: sedated       LABS:     Latest Ref Rng & Units 11/21/2021    4:13 AM 11/20/2021    9:55 PM 11/20/2021    5:35 PM  CMP  Glucose 70 - 99 mg/dL 79  151  149   BUN 6 - 20 mg/dL 64  73  70   Creatinine 0.44 - 1.00 mg/dL 12.72  16.14  15.66   Sodium 135 - 145 mmol/L 133  130  133   Potassium 3.5 - 5.1 mmol/L 4.3  5.7  5.1   Chloride 98 - 111 mmol/L 99  93  96   CO2 22 - 32 mmol/L '22  21  18   '$ Calcium 8.9 - 10.3 mg/dL 8.0   7.8  10.1   Total Protein 6.5 - 8.1 g/dL   6.3   Total Bilirubin 0.3 - 1.2 mg/dL   0.3   Alkaline Phos 38 - 126 U/L   144   AST 15 - 41 U/L   43   ALT 0 - 44 U/L   23       Latest Ref Rng & Units 11/21/2021    2:28 AM 11/20/2021    5:35 PM 11/18/2021    7:49 PM  CBC  WBC 4.0 - 10.5 K/uL 12.3  15.1  10.5   Hemoglobin 12.0 - 15.0 g/dL 7.1  9.3  8.8   Hematocrit 36.0 - 46.0 % 22.6  30.7  27.7   Platelets 150 - 400 K/uL  239  357  258     RADS: N/a Assessment:   Infected PD cath  Plan:   Reviewed chart and previous op note for PD cath insertion done over at [facility].  The intra-abdominal portion of the catheter reportedly was sutured to the abdominal wall.  Due to the suture, there is a possibility this PD cath will not be able to be removed without visualizing the intra-abdominal portion.  Unfortunately, her current clinical status makes it too high risk to proceed with general anesthesia in order to obtain visualization within the abdomen.    After discussion with the mother who is listed as next of kin, I recommended we proceed with removal of the tunneled portion under local, and attempt at removal of the intra-abdominal portion of the catheter with gentle traction without making any deeper incisions that will require more than local anesthesia.  If the intra-abdominal catheter portion is unable to be pulled out, we can  consider removal of that portion at a later time when she has recovered from her recent PEA, decreasing her chance of perioperative complications undergoing general anesthesia.  The patient mother verbalized understanding and all questions were answered to the patient's satisfaction.  Tentatively plan to proceed with surgery tomorrow after completion of CRRT and anesthesia evaluation.  Procedure will be performed in the OR for better lighting and equipment, and also with anesthesia monitoring.  labs/images/medications/previous chart entries reviewed personally and  relevant changes/updates noted above.

## 2021-11-21 NOTE — Progress Notes (Signed)
[place], [place] 11/21/21  Subjective:   Hospital day # 1  Patient known to our practice from outpatient dialysis.  Last few weeks she had purulent exit site infection with MRSA.  She was treated with intraperitoneal vancomycin and advised to get the catheter removed from her surgeon.  Patient missed her surgery appointment. Presented to the emergency room on 9/13 with severe shortness of breath, worsening edema and became unresponsive in the emergency room and had to be placed on the ventilator.   CT angiogram negative for acute pulmonary embolism, moderate to large bilateral pleural effusions, peribronchovascular infiltrates noted.  Buckle fracture of the anterior sternal wall.  Cardiomegaly.  Aortic and coronary artery calcifications noted.   Objective:  Vital signs in last 24 hours:  Temp:  [96.1 F (35.6 C)-98.4 F (36.9 C)] 96.1 F (35.6 C) (09/14 0800) Pulse Rate:  [31-117] 63 (09/14 0800) Resp:  [15-25] 16 (09/14 0800) BP: (110-153)/(64-109) 115/64 (09/14 0800) SpO2:  [72 %-100 %] 97 % (09/14 0800) FiO2 (%):  [35 %-100 %] 35 % (09/14 0757) Weight:  [92.4 kg] 92.4 kg (09/14 0318)  Weight change:  Filed Weights   11/20/21 1945 11/21/21 0318  Weight: 92.4 kg 92.4 kg    Intake/Output:    Intake/Output Summary (Last 24 hours) at 11/21/2021 0855 Last data filed at 11/21/2021 0800 Gross per 24 hour  Intake 1418.56 ml  Output 1998 ml  Net -579.44 ml     Physical Exam: General: Critically ill-appearing,  HEENT ET tube in place  Pulm/lungs Vent related assisted, FiO2 35%/PEEP 5  CVS/Heart Regular rhythm  Abdomen:  Nondistended, PD catheter in place  Extremities: 2+ pitting edema  Neurologic: Sedated  Skin: No acute rashes  Access: Right IJ temporary catheter, PD catheter in place       Basic Metabolic Panel:  Recent Labs  Lab 11/18/21 1949 11/20/21 1735 11/20/21 2155 11/21/21 0228 11/21/21 0413  NA 132* 133* 130*  --  133*   K 4.8 5.1 5.7*  --  4.3  CL 92* 96* 93*  --  99  CO2 23 18* 21*  --  22  GLUCOSE 105* 149* 151*  --  79  BUN 73* 70* 73*  --  64*  CREATININE 16.10* 15.66* 16.14*  --  12.72*  CALCIUM 8.2* 10.1 7.8*  --  8.0*  MG  --   --  2.1 2.0  --   PHOS  --   --   --   --  7.3*     CBC: Recent Labs  Lab 11/18/21 1949 11/20/21 1735 11/21/21 0228  WBC 10.5 15.1* 12.3*  NEUTROABS  --  10.3* 10.4*  HGB 8.8* 9.3* 7.1*  HCT 27.7* 30.7* 22.6*  MCV 89.1 93.6 88.6  PLT 258 357 239      Lab Results  Component Value Date   HEPBSAG NON REACTIVE 10/01/2021   HEPBIGM NON REACTIVE 10/01/2021      Microbiology:  Recent Results (from the past 240 hour(s))  Blood Culture (routine x 2)     Status: None (Preliminary result)   Collection Time: 11/20/21  5:54 PM   Specimen: BLOOD  Result Value Ref Range Status   Specimen Description BLOOD LEFT ANTECUBITAL  Final   Special Requests   Final    BOTTLES DRAWN AEROBIC AND ANAEROBIC Blood Culture adequate volume   Culture   Final    NO GROWTH < 12 HOURS Performed at [facility], [address] [place],  [place] [address]    Report Status PENDING  Incomplete  SARS Coronavirus 2 by RT PCR (hospital order, performed in [facility] hospital lab) *cepheid single result test* Anterior Nasal Swab     Status: None   Collection Time: 11/20/21  6:14 PM   Specimen: Anterior Nasal Swab  Result Value Ref Range Status   SARS Coronavirus 2 by RT PCR NEGATIVE NEGATIVE Final    Comment: (NOTE) SARS-CoV-2 target nucleic acids are NOT DETECTED.  The SARS-CoV-2 RNA is generally detectable in upper and lower respiratory specimens during the acute phase of infection. The lowest concentration of SARS-CoV-2 viral copies this assay can detect is 250 copies / mL. A negative result does not preclude SARS-CoV-2 infection and should not be used as the sole basis for treatment or other patient management decisions.  A negative result may occur with improper  specimen collection / handling, submission of specimen other than nasopharyngeal swab, presence of viral mutation(s) within the areas targeted by this assay, and inadequate number of viral copies (<250 copies / mL). A negative result must be combined with clinical observations, patient history, and epidemiological information.  Fact Sheet for Patients:   https://www.patel.info/  Fact Sheet for Healthcare Providers: https://hall.com/  This test is not yet approved or  cleared by the [place] FDA and has been authorized for detection and/or diagnosis of SARS-CoV-2 by FDA under an Emergency Use Authorization (EUA).  This EUA will remain in effect (meaning this test can be used) for the duration of the COVID-19 declaration under Section 564(b)(1) of the Act, 21 U.S.C. section 360bbb-3(b)(1), unless the authorization is terminated or revoked sooner.  Performed at [facility], [address] [place], [place] [address]   MRSA Next Gen by PCR, Nasal     Status: Abnormal   Collection Time: 11/20/21  7:50 PM   Specimen: Nasal Mucosa; Nasal Swab  Result Value Ref Range Status   MRSA by PCR Next Gen DETECTED (A) NOT DETECTED Final    Comment: RESULT CALLED TO, READ BACK BY AND VERIFIED WITH: BETH BUONO AT 2210 ON 11/20/21 BY SS (NOTE) The GeneXpert MRSA Assay (FDA approved for NASAL specimens only), is one component of a comprehensive MRSA colonization surveillance program. It is not intended to diagnose MRSA infection nor to guide or monitor treatment for MRSA infections. Test performance is not FDA approved in patients less than 66 years old. Performed at [facility], [address] [place], [place] [address]   Blood Culture (routine x 2)     Status: None (Preliminary result)   Collection Time: 11/20/21  9:55 PM   Specimen: BLOOD  Result Value Ref Range Status   Specimen Description BLOOD CENTRAL LINE  Final    Special Requests   Final    BOTTLES DRAWN AEROBIC AND ANAEROBIC Blood Culture results may not be optimal due to an inadequate volume of blood received in culture bottles   Culture   Final    NO GROWTH < 12 HOURS Performed at [facility], [address] [place],  [address]    Report Status PENDING  Incomplete    Coagulation Studies: Recent Labs    11/20/21 1735  LABPROT 16.6*  INR 1.4*    Urinalysis: Recent Labs    11/20/21 1830  COLORURINE STRAW*  LABSPEC 1.009  PHURINE 8.0  GLUCOSEU 50*  HGBUR NEGATIVE  BILIRUBINUR NEGATIVE  KETONESUR NEGATIVE  PROTEINUR 100*  NITRITE NEGATIVE  LEUKOCYTESUR NEGATIVE      Imaging:  DG Abd 1 View  Result Date: 11/20/2021 CLINICAL DATA:  Status post OG tube placement. EXAM: ABDOMEN - 1 VIEW COMPARISON:  November 04, 2021 FINDINGS: An orogastric tube is seen with its distal tip overlying the body of the stomach. The distal side hole sits approximately 5.1 cm distal to the expected region of the gastroesophageal junction. The bowel gas pattern is normal. No radio-opaque calculi or other significant radiographic abnormality are seen. IMPRESSION: Orogastric tube positioning, as described above. Electronically Signed   By: Virgina Norfolk M.D.   On: 11/20/2021 21:45   DG Chest 1 View  Result Date: 11/20/2021 CLINICAL DATA:  Status post line placement. EXAM: CHEST  1 VIEW COMPARISON:  November 20, 2021 FINDINGS: There is stable endotracheal tube and nasogastric tube positioning. Interval right internal jugular venous catheter placement is seen with its distal tip at the junction of the superior vena cava and right atrium. The cardiac silhouette is mildly enlarged and unchanged in size. Stable marked severity bilateral airspace disease is seen. Small bilateral pleural effusions are again noted. No pneumothorax is identified. The visualized skeletal structures are unremarkable. IMPRESSION: 1. Interval right internal jugular venous  catheter placement positioning, as described above. 2. Stable endotracheal tube and nasogastric tube positioning. Withdrawal of the ET tube by approximately 2 cm is recommended. 3. Stable marked severity bilateral airspace disease. Electronically Signed   By: Virgina Norfolk M.D.   On: 11/20/2021 21:44   CT Angio Chest PE W/Cm &/Or Wo Cm  Result Date: 11/20/2021 CLINICAL DATA:  Cardiac arrest; respiratory distress rule out PE EXAM: CT ANGIOGRAPHY CHEST WITH CONTRAST TECHNIQUE: Multidetector CT imaging of the chest was performed using the standard protocol during bolus administration of intravenous contrast. Multiplanar CT image reconstructions and MIPs were obtained to evaluate the vascular anatomy. RADIATION DOSE REDUCTION: This exam was performed according to the departmental dose-optimization program which includes automated exposure control, adjustment of the mA and/or kV according to patient size and/or use of iterative reconstruction technique. CONTRAST:  27m OMNIPAQUE IOHEXOL 350 MG/ML SOLN COMPARISON:  Radiographs earlier today FINDINGS: Cardiovascular: Satisfactory opacification of the pulmonary arteries to the segmental level. No evidence of pulmonary embolism. Mild cardiomegaly. Trace pericardial effusion. Aortic and coronary artery atherosclerotic calcification. Mediastinum/Nodes: No enlarged mediastinal, hilar, or axillary lymph nodes. ETT within the low intrathoracic trachea projecting toward the right mainstem bronchus with tip 6 mm from the carina. Subdiaphragmatic enteric tube. Lungs/Pleura: Moderate-large bilateral pleural effusions and associated atelectasis/consolidation. Interlobular septal thickening greatest in the upper lungs. Diffuse peribronchovascular consolidation. Upper Abdomen: No acute abnormality. Musculoskeletal: Buckle fracture of the anterior sternal wall. The posterior sternal wall is intact. No acute rib fracture. Review of the MIP images confirms the above findings.  IMPRESSION: 1. Negative for acute pulmonary embolism. 2. Moderate-large bilateral pleural effusions. 3. Peribronchovascular infiltrates bilaterally favored infectious/inflammatory likely with superimposed edema. 4. The ETT tip is within the low intrathoracic trachea approximately 6 mm from the carina. Recommend repositioning. 5. Buckle fracture of the anterior sternal wall. The posterior sternal wall is intact. 6. Cardiomegaly. 7. Aortic and coronary artery atherosclerotic calcification Electronically Signed   By: TPlacido SouM.D.   On: 11/20/2021 19:40   CT HEAD WO CONTRAST (5MM)  Result Date: 11/20/2021 CLINICAL DATA:  New onset seizure EXAM: CT HEAD WITHOUT CONTRAST TECHNIQUE: Contiguous axial images were obtained from the base of the skull through the vertex without intravenous contrast. RADIATION DOSE REDUCTION: This exam was performed according to the departmental dose-optimization program which includes automated exposure  control, adjustment of the mA and/or kV according to patient size and/or use of iterative reconstruction technique. COMPARISON:  None Available. FINDINGS: Brain: No evidence of acute infarction, hemorrhage, hydrocephalus, extra-axial collection or mass lesion/mass effect. Vascular: Negative for hyperdense vessel Skull: Negative Sinuses/Orbits: Paranasal sinuses clear.  Negative orbit Other: None IMPRESSION: Negative CT head Electronically Signed   By: Franchot Gallo M.D.   On: 11/20/2021 19:34   DG Chest Portable 1 View  Result Date: 11/20/2021 CLINICAL DATA:  Intubation.  ET tube, OG tube placement EXAM: PORTABLE CHEST 1 VIEW COMPARISON:  11/18/2021 FINDINGS: Endotracheal tube 1.5 cm above the carina. OG tube is in the stomach. Cardiomegaly. Diffuse bilateral airspace disease, worsening since prior study, favor edema. Suspect small right effusion. No acute bony abnormality. IMPRESSION: Worsening bilateral airspace disease most likely edema/CHF. Small right effusion.  Electronically Signed   By: Rolm Baptise M.D.   On: 11/20/2021 18:13     Medications:    sodium chloride     sodium chloride Stopped (11/20/21 2201)   fentaNYL infusion INTRAVENOUS 200 mcg/hr (11/21/21 0800)   norepinephrine (LEVOPHED) Adult infusion Stopped (11/21/21 0201)   prismasol BGK 2/2.5 dialysis solution 1,500 mL/hr at 11/21/21 0223   prismasol BGK 2/2.5 replacement solution 300 mL/hr at 11/20/21 2231   prismasol BGK 2/2.5 replacement solution 300 mL/hr at 11/20/21 2231   propofol (DIPRIVAN) infusion 70 mcg/kg/min (11/21/21 0800)    Chlorhexidine Gluconate Cloth  6 each Topical Q0600   docusate  100 mg Per Tube BID   heparin  5,000 Units Subcutaneous Q8H   mupirocin ointment  1 Application Nasal BID   mouth rinse  15 mL Mouth Rinse Q2H   pantoprazole  40 mg Per Tube Daily   pantoprazole (PROTONIX) IV  40 mg Intravenous QHS   polyethylene glycol  17 g Per Tube Daily   sodium chloride flush  3 mL Intravenous Q12H   sodium chloride, acetaminophen, docusate sodium, fentaNYL, heparin, midazolam, ondansetron (ZOFRAN) IV, mouth rinse, polyethylene glycol, sodium chloride, sodium chloride flush  Assessment/ Plan:  41 y.o. female with end-stage renal disease on peritoneal dialysis chronic pain syndrome, history of diabetes, now diet controlled, hypertension, hyperlipidemia    admitted on 11/20/2021 for Cardiac arrest [facility]) [I46.9]  #End-stage renal disease Peritoneal dialysis with severe MRSA exit site infection.  Currently being treated with vancomycin IV.  Random level this morning is 33.  General surgery has been consulted for removal of PD catheter due to MRSA infection. Patient now has a temporary dialysis catheter.  She required CRRT overnight for volume management and electrolyte optimization.  She is off pressors therefore CRRT will be discontinued.  We will plan to do intermittent hemodialysis tomorrow.  Patient will need to be set up with outpatient intermittent hemodialysis  with PermCath once cleared of all infections.  #MRSA exit site infection (11/07/21) As above  #Anemia of chronic kidney disease Hemoglobin 7.4 today. We will prescribe Epogen with hemodialysis. Received blood transfusion this admission.  Secondary hyperparathyroidism of renal origin Lab Results  Component Value Date   CALCIUM 8.0 (L) 11/21/2021   PHOS 7.3 (H) 11/21/2021   Patient with hypocalcemia and hyperphosphatemia. We will monitor closely.  May need to start binders once able to eat a normal diet.      LOS: 1 Randie Bloodgood [name] 9/14/20238:55 [place], [place]  Note: This note was prepared with Dragon dictation. Any transcription errors are unintentional

## 2021-11-21 NOTE — Progress Notes (Addendum)
Name: Nichole Wiley MRN: [id number] DOB: 1981/02/12     CONSULTATION DATE: 11/20/2021  REFERRING MD :  Jacelyn Grip  CHIEF COMPLAINT:  cardiac arrest   SYNOPSIS 9/11 41 y.o. female whose medical history is most notable for chronic end-stage renal disease on peritoneal dialysis, as well as's chronic pain syndrome, pelvic floor dysfunction, prior history of opioid addiction,  [name] is her nephrologist. - [name] was concerned about the possibility of a PD catheter infection and that she has completed an extensive course of antibiotics.   She was supposed to have the peritoneal dialysis catheter removed in favor of a temporary hemodialysis catheter, but that had to be canceled because her fianc just died and she is having the funeral in about 3 days.   9/13 presented to ER with severe SOB, while in ER, patient became unresponsive and had acute sudden cardiac arrest and placed on VENT  PCCM called to evaluate and admit to ICU   No labs WHEN PCCM WAS CALLED TO ADMIT PATIENT  ER Course.HOSPITAL EVENTS 9/11 seen for  possible infected catheter in the ER 9/12 discharged home from ER 9/13 presented to ER with cardiac arrest 9/13 VASC CATH PLACED, CRRT STARTED, FIO2 AT 100% 9/14 SEVERE HYPOXIA FIO2 AT 70%    MICROBIOLOGY 9/13 MRSA + 9/13 BL CX PENDING 9/13 COVID NEG   Antibiotics Given (last 72 hours)     Date/Time Action Medication Dose Rate   11/20/21 1800 New Bag/Given   ceFEPIme (MAXIPIME) 2 g in sodium chloride 0.9 % 100 mL IVPB 2 g 200 mL/hr   11/20/21 2016 New Bag/Given   vancomycin (VANCOREADY) IVPB 1500 mg/300 mL 1,500 mg 150 mL/hr            PAST MEDICAL HISTORY :   has a past medical history of Abdominal mass, Arthritis, Chronic kidney disease, Colon polyp, Diabetes 1.5, managed as type 1 ([place]), Drug addiction ([place]), GERD (gastroesophageal reflux disease), Hypertension, Pelvic floor dysfunction, and Urine incontinence.  has a past surgical history that  includes Tonsillectomy (1987); Cesarean section (2003); Tubal ligation (2004); Abdominal hysterectomy (2005); Right oophorectomy (2012); laparoscopy; Lysis of adhesion; Colonoscopy with propofol (N/A, 09/14/2015); polypectomy (09/14/2015); Esophagogastroduodenoscopy (egd) with propofol (N/A, 06/19/2016); Vulvectomy (2011); Cystoscopy w/ ureteral stent placement (Right, 06/06/2017); Cystoscopy w/ ureteral stent placement (Right, 07/06/2017); Cystoscopy w/ retrogrades (Right, 07/06/2017); and TEMPORARY DIALYSIS CATHETER (N/A, 10/01/2021). Prior to Admission medications   Medication Sig Start Date End Date Taking? Authorizing Provider  Accu-Chek Softclix Lancets lancets Use to check blood sugar daily for type 2 diabetes. E11.9 06/27/21   Jearld Fenton, NP  amLODipine (NORVASC) 10 MG tablet Take 1 tablet (10 mg total) by mouth daily. Patient not taking: Reported on 11/18/2021 10/04/21   Fritzi Mandes, MD  aspirin EC 81 MG tablet Take 81 mg by mouth daily.    [provider]  gentamicin cream (GARAMYCIN) 0.1 % Apply 1 Application topically daily. Patient not taking: Reported on 11/18/2021 10/04/21   Fritzi Mandes, MD  glucose blood (ACCU-CHEK GUIDE) test strip 1 each by Other route See admin instructions. 07/08/21   Jearld Fenton, NP  labetalol (NORMODYNE) 100 MG tablet Take 1 tablet (100 mg total) by mouth 3 (three) times daily. Patient not taking: Reported on 11/18/2021 10/03/21   Fritzi Mandes, MD  levothyroxine (SYNTHROID) 25 MCG tablet Take 1 tablet (25 mcg total) by mouth daily. Patient not taking: Reported on 11/18/2021 08/08/21   Jearld Fenton, NP  methadone (DOLOPHINE) 10 MG/ML  solution Take 120 mg by mouth daily. Changed by Dr Posey Pronto to get the right home dose w/o sending rx    [provider]  metolazone (ZAROXOLYN) 2.5 MG tablet Take 1 tablet (2.5 mg total) by mouth every other day. 10/29/21   Jearld Fenton, NP  naloxone [facility]) nasal spray 4 mg/0.1 mL One spray in either nostril once for  known/suspected opioid overdose. May repeat every 2-3 minutes in alternating nostril til EMS arrives 04/29/21   [provider]  omeprazole (PRILOSEC) 20 MG capsule Take 1 capsule (20 mg total) by mouth daily. 08/07/21   Jearld Fenton, NP  torsemide (DEMADEX) 100 MG tablet Take 1 tablet (100 mg total) by mouth daily. 11/19/21   Hinda Kehr, MD   Allergies  Allergen Reactions   Cephalexin Other (See Comments) and Rash    Unknown   Penicillins Rash    Has patient had a PCN reaction causing immediate rash, facial/tongue/throat swelling, SOB or lightheadedness with hypotension: No Has patient had a PCN reaction causing severe rash involving mucus membranes or skin necrosis: No Has patient had a PCN reaction that required hospitalization: No Has patient had a PCN reaction occurring within the last 10 years: No If all of the above answers are "NO", then may proceed with Cephalosporin use.      Estimated body mass index is 31.9 kg/m as calculated from the following:   Height as of this encounter: '5\' 7"'$  (1.702 m).   Weight as of this encounter: 92.4 kg.    VITAL SIGNS: Temp:  [96.3 F (35.7 C)-98.4 F (36.9 C)] 96.4 F (35.8 C) (09/14 0700) Pulse Rate:  [31-117] 63 (09/14 0700) Resp:  [15-25] 18 (09/14 0700) BP: (110-153)/(66-109) 119/66 (09/14 0700) SpO2:  [72 %-100 %] 99 % (09/14 0700) FiO2 (%):  [70 %-100 %] 70 % (09/14 0700) Weight:  [92.4 kg] 92.4 kg (09/14 0318)   I/O last 3 completed shifts: In: 2947 [I.V.:924.7; IV Piggyback:390.3] Out: 6546 [Urine:330; Emesis/NG output:150] No intake/output data recorded.   SpO2: 99 % FiO2 (%): 70 %    REVIEW OF SYSTEMS  PATIENT IS UNABLE TO PROVIDE COMPLETE REVIEW OF SYSTEMS DUE TO SEVERE CRITICAL ILLNESS    PHYSICAL EXAMINATION:  GENERAL:critically ill appearing, +resp distress EYES: Pupils equal, round, reactive to light.  No scleral icterus.  MOUTH: Moist mucosal membrane. INTUBATED NECK: Supple.  PULMONARY:  +rhonchi, +wheezing CARDIOVASCULAR: S1 and S2.  No murmurs  GASTROINTESTINAL: Soft, nontender, -distended. Positive bowel sounds.  MUSCULOSKELETAL: No swelling, clubbing, or edema.  NEUROLOGIC: obtunded SKIN:intact,warm,dry   REVIEW OF SYSTEMS  PATIENT IS UNABLE TO PROVIDE COMPLETE REVIEW OF SYSTEMS DUE TO SEVERE CRITICAL ILLNESS     I personally reviewed lab work that was obtained in last 24 hrs. CXR Independently reviewed-Acute B/l opacities likely from pulm edema   MEDICATIONS: I have reviewed all medications and confirmed regimen as documented       Indwelling Urinary Catheter continued, requirement due to   Reason to continue Indwelling Urinary Catheter strict Intake/Output monitoring for hemodynamic instability   Central Line/ continued, requirement due to  Reason to continue [place] of central venous pressure or other hemodynamic parameters and poor IV access   Ventilator continued, requirement due to severe respiratory failure   Ventilator Sedation RASS 0 to -2      ASSESSMENT AND PLAN SYNOPSIS  41 yo obese white female with ESRD on PD admitted to ICU for acute SOB with sudden cardiac arrest leading to severe  hypoxic resp failure likely related to acute pulm edema/pleural effusions with possible infected PD catheter with underlying end stage renal disease  Severe ACUTE Hypoxic and Hypercapnic Respiratory Failure -continue Mechanical Ventilator support -Wean Fio2 and PEEP as tolerated -VAP/VENT bundle implementation - Wean PEEP & FiO2 as tolerated, maintain SpO2 > 88% - Head of bed elevated 30 degrees, VAP protocol in place - Plateau pressures less than 30 cm H20  - Intermittent chest x-ray & ABG PRN - Ensure adequate pulmonary hygiene  CT CHEST R/O PE -will NOT perform SAT/SBT due to severe hypoxia  Vent Mode: PRVC FiO2 (%):  [70 %-100 %] 70 % Set Rate:  [18 bmp] 18 bmp Vt Set:  [450 mL] 450 mL PEEP:  [5 cmH20] 5 cmH20 Plateau  Pressure:  [20 cmH20] 20 cmH20     ACUTE CARDIAC ARREST- ECHO PENDING Elevated Troponin secondary to demand ischemia vs NSTEMI - Trend troponins  - Echocardiogram ordered - consider systemic anticoagulation as troponin trend suggests - continuous cardiac monitoring -oxygen as needed/Vent suport -follow up cardiac enzymes as indicated   Morbid obesity  Will certainly impact respiratory mechanics, ventilator weaning Suspect will need to consider additional PEEP, possible extubation to BiPAP when appropriate to consider    KIDNEY INJURY/end stage Renal Failure -continue Foley Catheter-assess need -Avoid nephrotoxic agents -Follow urine output, BMP -Ensure adequate renal perfusion, optimize oxygenation -Renal dose medications Vasc cath placed started CRRT   Intake/Output Summary (Last 24 hours) at 11/21/2021 0728 Last data filed at 11/21/2021 0700 Gross per 24 hour  Intake 1315.02 ml  Output 1734 ml  Net -418.98 ml     NEUROLOGY ACUTE METABOLIC ENCEPHALOPATHY -need for sedation -Goal RASS -2 to -3    CARDIAC ICU monitoring  INFECTIOUS DISEASE possible infected PD catheter Consult GEN SURG to remove catheter -continue antibiotics as prescribed -follow up cultures   ENDO - ICU hypoglycemic\Hyperglycemia protocol -check FSBS per protocol   GI GI PROPHYLAXIS as indicated  NUTRITIONAL STATUS DIET-->TF's as tolerated Constipation protocol as indicated   ELECTROLYTES -follow labs as needed -replace as needed -pharmacy consultation and following    DVT/GI PRX  assessed I Assessed the need for Labs I Assessed the need for Foley I Assessed the need for Central Venous Line Family Discussion when available I Assessed the need for Mobilization I made an Assessment of medications to be adjusted accordingly Safety Risk assessment completed  CASE DISCUSSED IN MULTIDISCIPLINARY ROUNDS WITH ICU TEAM     Critical Care Time devoted to patient care services  described in this note is 58  minutes.  Critical care was necessary to treat /prevent imminent and life-threatening deterioration. Overall, patient is critically ill, prognosis is guarded.  Patient with Multiorgan failure and at high risk for cardiac arrest and death.    Corrin Parker, M.D.  [facility]  Medical Director [place] Director [facility] Cardio-Pulmonary Department

## 2021-11-21 NOTE — Progress Notes (Addendum)
Filter pressure continues to alarm high, unable to continue treatment. Unable to return blood. MD notified. HD  lumens hep locked and wrapped at this time. Will continue to monitor.

## 2021-11-22 ENCOUNTER — Inpatient Hospital Stay: Admit: 2021-11-22 | Payer: Medicaid Other

## 2021-11-22 ENCOUNTER — Other Ambulatory Visit: Payer: Self-pay

## 2021-11-22 ENCOUNTER — Encounter
Admission: EM | Discharge status: Against Medical Advice | Payer: Self-pay | Source: Home / Self Care | Attending: Internal Medicine

## 2021-11-22 ENCOUNTER — Inpatient Hospital Stay: Payer: Medicaid Other | Admitting: General Practice

## 2021-11-22 DIAGNOSIS — N186 End stage renal disease: Secondary | ICD-10-CM

## 2021-11-22 DIAGNOSIS — A419 Sepsis, unspecified organism: Secondary | ICD-10-CM | POA: Diagnosis present

## 2021-11-22 DIAGNOSIS — R6520 Severe sepsis without septic shock: Secondary | ICD-10-CM

## 2021-11-22 DIAGNOSIS — T8571XA Infection and inflammatory reaction due to peritoneal dialysis catheter, initial encounter: Secondary | ICD-10-CM | POA: Diagnosis present

## 2021-11-22 DIAGNOSIS — G928 Other toxic encephalopathy: Secondary | ICD-10-CM | POA: Diagnosis present

## 2021-11-22 DIAGNOSIS — Z992 Dependence on renal dialysis: Secondary | ICD-10-CM

## 2021-11-22 HISTORY — PX: CAPD REMOVAL: SHX5234

## 2021-11-22 LAB — RENAL FUNCTION PANEL
Albumin: 1.8 g/dL — ABNORMAL LOW (ref 3.5–5.0)
Albumin: 1.8 g/dL — ABNORMAL LOW (ref 3.5–5.0)
Anion gap: 13 (ref 5–15)
Anion gap: 11 (ref 5–15)
BUN: 51 mg/dL — ABNORMAL HIGH (ref 6–20)
BUN: 28 mg/dL — ABNORMAL HIGH (ref 6–20)
CO2: 22 mmol/L (ref 22–32)
CO2: 26 mmol/L (ref 22–32)
Calcium: 7.6 mg/dL — ABNORMAL LOW (ref 8.9–10.3)
Calcium: 7.4 mg/dL — ABNORMAL LOW (ref 8.9–10.3)
Chloride: 98 mmol/L (ref 98–111)
Chloride: 97 mmol/L — ABNORMAL LOW (ref 98–111)
Creatinine, Ser: 9.88 mg/dL — ABNORMAL HIGH (ref 0.44–1.00)
Creatinine, Ser: 5.87 mg/dL — ABNORMAL HIGH (ref 0.44–1.00)
GFR, Estimated: 5 mL/min — ABNORMAL LOW (ref >60)
GFR, Estimated: 9 mL/min — ABNORMAL LOW (ref >60)
Glucose, Bld: 107 mg/dL — ABNORMAL HIGH (ref 70–99)
Glucose, Bld: 73 mg/dL (ref 70–99)
Phosphorus: 6.1 mg/dL — ABNORMAL HIGH (ref 2.5–4.6)
Phosphorus: 4 mg/dL (ref 2.5–4.6)
Potassium: 4.1 mmol/L (ref 3.5–5.1)
Potassium: 3.6 mmol/L (ref 3.5–5.1)
Sodium: 133 mmol/L — ABNORMAL LOW (ref 135–145)
Sodium: 134 mmol/L — ABNORMAL LOW (ref 135–145)

## 2021-11-22 LAB — HEMOGLOBIN AND HEMATOCRIT, BLOOD
HCT: 22.9 % — ABNORMAL LOW (ref 36.0–46.0)
Hemoglobin: 7 g/dL — ABNORMAL LOW (ref 12.0–15.0)

## 2021-11-22 LAB — GLUCOSE, CAPILLARY
Glucose-Capillary: 104 mg/dL — ABNORMAL HIGH (ref 70–99)
Glucose-Capillary: 106 mg/dL — ABNORMAL HIGH (ref 70–99)
Glucose-Capillary: 110 mg/dL — ABNORMAL HIGH (ref 70–99)
Glucose-Capillary: 112 mg/dL — ABNORMAL HIGH (ref 70–99)
Glucose-Capillary: 66 mg/dL — ABNORMAL LOW (ref 70–99)
Glucose-Capillary: 72 mg/dL (ref 70–99)
Glucose-Capillary: 72 mg/dL (ref 70–99)
Glucose-Capillary: 72 mg/dL (ref 70–99)
Glucose-Capillary: 72 mg/dL (ref 70–99)
Glucose-Capillary: 74 mg/dL (ref 70–99)
Glucose-Capillary: 77 mg/dL (ref 70–99)
Glucose-Capillary: 79 mg/dL (ref 70–99)
Glucose-Capillary: 80 mg/dL (ref 70–99)
Glucose-Capillary: 89 mg/dL (ref 70–99)
Glucose-Capillary: 90 mg/dL (ref 70–99)
Glucose-Capillary: 95 mg/dL (ref 70–99)
Glucose-Capillary: 96 mg/dL (ref 70–99)
Glucose-Capillary: 96 mg/dL (ref 70–99)
Glucose-Capillary: 99 mg/dL (ref 70–99)

## 2021-11-22 LAB — CBC
HCT: 22.8 % — ABNORMAL LOW (ref 36.0–46.0)
Hemoglobin: 7 g/dL — ABNORMAL LOW (ref 12.0–15.0)
MCH: 28.2 pg (ref 26.0–34.0)
MCHC: 30.7 g/dL (ref 30.0–36.0)
MCV: 91.9 fL (ref 80.0–100.0)
Platelets: 184 10*3/uL (ref 150–400)
RBC: 2.48 MIL/uL — ABNORMAL LOW (ref 3.87–5.11)
RDW: 15.2 % (ref 11.5–15.5)
WBC: 8.8 10*3/uL (ref 4.0–10.5)
nRBC: 0 % (ref 0.0–0.2)

## 2021-11-22 LAB — URINE CULTURE: Culture: NO GROWTH

## 2021-11-22 LAB — VANCOMYCIN, RANDOM: Vancomycin Rm: 28 ug/mL

## 2021-11-22 LAB — PROCALCITONIN: Procalcitonin: 4.54 ng/mL

## 2021-11-22 LAB — TRIGLYCERIDES: Triglycerides: 139 mg/dL (ref <150)

## 2021-11-22 LAB — MAGNESIUM: Magnesium: 1.8 mg/dL (ref 1.7–2.4)

## 2021-11-22 SURGERY — CONTINUOUS AMBULATORY PERITONEAL DIALYSIS  (CAPD) CATHETER REMOVAL
Anesthesia: General | Site: Abdomen

## 2021-11-22 MED ORDER — SODIUM CHLORIDE 0.9 % IV SOLN: 1.0000 g | INTRAVENOUS | Status: DC

## 2021-11-22 MED ORDER — HEPARIN SODIUM (PORCINE) 1000 UNIT/ML IJ SOLN
INTRAMUSCULAR | Status: AC
  Filled 2021-11-22: qty 10

## 2021-11-22 MED ORDER — HEPARIN SODIUM (PORCINE) 1000 UNIT/ML DIALYSIS: 20.0000 [IU]/kg | INTRAMUSCULAR | Status: DC | PRN

## 2021-11-22 MED ORDER — EPOETIN ALFA 10000 UNIT/ML IJ SOLN
4000.0000 [IU] | INTRAMUSCULAR | Status: DC
  Administered 2021-11-23 – 2021-11-26 (×2): 4000 [IU] via INTRAVENOUS
  Filled 2021-11-22: qty 0.4
  Filled 2021-11-22: qty 1

## 2021-11-22 MED ORDER — BUPIVACAINE-EPINEPHRINE 0.25% -1:200000 IJ SOLN
INTRAMUSCULAR | Status: DC | PRN
  Administered 2021-11-22: 14 mL
  Administered 2021-11-22: 6 mL

## 2021-11-22 MED ORDER — NOREPINEPHRINE 4 MG/250ML-% IV SOLN
0.0000 ug/min | INTRAVENOUS | Status: DC
  Administered 2021-11-22: 2 ug/min via INTRAVENOUS

## 2021-11-22 MED ORDER — ROCURONIUM BROMIDE 100 MG/10ML IV SOLN
INTRAVENOUS | Status: DC | PRN
  Administered 2021-11-22: 50 mg via INTRAVENOUS

## 2021-11-22 MED ORDER — SODIUM CHLORIDE 0.9% IV SOLUTION: Freq: Once | INTRAVENOUS | Status: AC

## 2021-11-22 MED ORDER — HEPARIN SODIUM (PORCINE) 5000 UNIT/ML IJ SOLN
5000.0000 [IU] | Freq: Three times a day (TID) | INTRAMUSCULAR | Status: DC
  Administered 2021-11-22 – 2021-11-26 (×11): 5000 [IU] via SUBCUTANEOUS
  Filled 2021-11-22 (×12): qty 1

## 2021-11-22 MED ORDER — SODIUM CHLORIDE 0.9 % IV SOLN
2.0000 g | INTRAVENOUS | Status: DC
  Administered 2021-11-22 – 2021-11-24 (×3): 2 g via INTRAVENOUS
  Filled 2021-11-22: qty 20
  Filled 2021-11-22 (×2): qty 2

## 2021-11-22 MED ORDER — MAGNESIUM SULFATE IN D5W 1-5 GM/100ML-% IV SOLN
1.0000 g | Freq: Once | INTRAVENOUS | Status: AC
  Administered 2021-11-22: 1 g via INTRAVENOUS
  Filled 2021-11-22: qty 100

## 2021-11-22 SURGICAL SUPPLY — 45 items
ADH SKN CLS APL DERMABOND .7 (GAUZE/BANDAGES/DRESSINGS) ×1
APL PRP STRL LF DISP 70% ISPRP (MISCELLANEOUS) ×1
BLADE CLIPPER SURG (BLADE) IMPLANT
CHLORAPREP W/TINT 26 (MISCELLANEOUS) ×1 IMPLANT
DERMABOND ADVANCED .7 DNX12 (GAUZE/BANDAGES/DRESSINGS) ×1 IMPLANT
DRSG OPSITE POSTOP 3X4 (GAUZE/BANDAGES/DRESSINGS) IMPLANT
DRSG TELFA 3X4 N-ADH STERILE (GAUZE/BANDAGES/DRESSINGS) IMPLANT
ELECT CAUTERY BLADE TIP 2.5 (TIP) ×1
ELECT REM PT RETURN 9FT ADLT (ELECTROSURGICAL) ×1
ELECTRODE CAUTERY BLDE TIP 2.5 (TIP) ×1 IMPLANT
ELECTRODE REM PT RTRN 9FT ADLT (ELECTROSURGICAL) ×1 IMPLANT
GLOVE BIO SURGEON STRL SZ7 (GLOVE) ×1 IMPLANT
GOWN STRL REUS W/ TWL LRG LVL3 (GOWN DISPOSABLE) ×2 IMPLANT
GOWN STRL REUS W/TWL LRG LVL3 (GOWN DISPOSABLE) ×2
GRASPER SUT TROCAR 14GX15 (MISCELLANEOUS) ×1 IMPLANT
KIT TURNOVER KIT A (KITS) ×1 IMPLANT
MANIFOLD NEPTUNE II (INSTRUMENTS) ×1 IMPLANT
NDL INSUFFLATION 14GA 120MM (NEEDLE) ×1 IMPLANT
NDL SAFETY ECLIP 18X1.5 (MISCELLANEOUS) ×1 IMPLANT
NEEDLE HYPO 22GX1.5 SAFETY (NEEDLE) ×1 IMPLANT
NEEDLE INSUFFLATION 14GA 120MM (NEEDLE) ×1 IMPLANT
NS IRRIG 500ML POUR BTL (IV SOLUTION) ×1 IMPLANT
PACK LAP CHOLECYSTECTOMY (MISCELLANEOUS) ×1 IMPLANT
PENCIL SMOKE EVACUATOR (MISCELLANEOUS) ×1 IMPLANT
SET TUBE SMOKE EVAC HIGH FLOW (TUBING) ×1 IMPLANT
SLEEVE ADV FIXATION 5X100MM (TROCAR) ×1 IMPLANT
SPONGE GAUZE 2X2 8PLY STRL LF (GAUZE/BANDAGES/DRESSINGS) IMPLANT
SPONGE T-LAP 18X18 ~~LOC~~+RFID (SPONGE) ×1 IMPLANT
STAPLER SKIN PROX 35W (STAPLE) IMPLANT
SUT ETHILON 3-0 FS-10 30 BLK (SUTURE)
SUT MNCRL 4-0 (SUTURE)
SUT MNCRL 4-0 27XMFL (SUTURE)
SUT VIC AB 3-0 SH 27 (SUTURE) ×1
SUT VIC AB 3-0 SH 27X BRD (SUTURE) ×1 IMPLANT
SUT VICRYL 0 AB UR-6 (SUTURE) ×2 IMPLANT
SUTURE EHLN 3-0 FS-10 30 BLK (SUTURE) IMPLANT
SUTURE MNCRL 4-0 27XMF (SUTURE) ×1 IMPLANT
SYR 20ML LL LF (SYRINGE) ×1 IMPLANT
SYR 3ML LL SCALE MARK (SYRINGE) ×1 IMPLANT
SYS KII FIOS ACCESS ABD 5X100 (TROCAR) ×1
SYSTEM KII FIOS ACES ABD 5X100 (TROCAR) ×1 IMPLANT
TRAP FLUID SMOKE EVACUATOR (MISCELLANEOUS) ×1 IMPLANT
TROCAR BALLN GELPORT 12X130M (ENDOMECHANICALS) ×1 IMPLANT
TROCAR XCEL NON-BLD 5MMX100MML (ENDOMECHANICALS) ×1 IMPLANT
WATER STERILE IRR 500ML POUR (IV SOLUTION) ×1 IMPLANT

## 2021-11-22 NOTE — Progress Notes (Signed)
NAME:  Nichole Wiley, MRN:  [id number], DOB:  [birth year], LOS: 2 ADMISSION DATE:  11/20/2021, CONSULTATION DATE:  11/20/2021 REFERRING MD:  Dr. Jacelyn Grip, CHIEF COMPLAINT:  Cardiac Arrest   Brief Pt Description / Synopsis:    History of Present Illness:  9/11 41 y.o. female whose medical history is most notable for chronic end-stage renal disease on peritoneal dialysis, as well as's chronic pain syndrome, pelvic floor dysfunction, prior history of opioid addiction,  [name] is her nephrologist. - [name] was concerned about the possibility of a PD catheter infection and that she has completed an extensive course of antibiotics.   She was supposed to have the peritoneal dialysis catheter removed in favor of a temporary hemodialysis catheter, but that had to be canceled because her fianc just died and she is having the funeral in about 3 days.   9/13 presented to ER with severe SOB, while in ER, patient became unresponsive and had acute sudden cardiac arrest and placed on VENT   PCCM called to evaluate and admit to ICU.  Please see "significant hospital events" section below for full detailed hospital course.   Pertinent  Medical History   Past Medical History:  Diagnosis Date   Abdominal mass    Arthritis    back   Chronic kidney disease    Colon polyp    Diabetes 1.5, managed as type 1 ([place])    Drug addiction ([place])    hx (pain pills after c-sect)   GERD (gastroesophageal reflux disease)    Hypertension    Pelvic floor dysfunction    Urine incontinence     Micro Data:  9/13: SARS-CoV-2 PCR>> negative 9/13: Blood culture x2>> no growth to date 9/13: Urine>> no growth 9/13: MRSA PCR>> positive 9/14: Hepatitis panel>> nonreactive  Antimicrobials:  Cefepime 9/13>> 9/15 Vancomycin 9/13>> Ceftriaxone 9/15>>  Significant Hospital Events: Including procedures, antibiotic start and stop dates in addition to other pertinent events   9/11 seen for  possible infected catheter  in the ER 9/12 discharged home from ER 9/13 presented to ER with cardiac arrest 9/13 VASC CATH PLACED, CRRT STARTED, FIO2 AT 100% 9/14 SEVERE HYPOXIA FIO2 AT 70%. CRRT discontinued 9/15: FiO2 requirements down to 40%.  Plan for removal of infected PD catheter in OR with General Surgery.  Transfuse 1 unit pRBCs for Hgb 7.0.  Cefepime deescalated to Ceftriaxone  Interim History / Subjective:  -No significant events noted overnight -Afebrile, hemodynamically stable, not requiring vasopressors -Plan for PD catheter removal in OR today with general surgery -On minimal vent support: 40% FiO2 and 5 of PEEP ~will trial SBT after OR today -CRRT was stopped yesterday, nephrology plans for HD today -Hemoglobin noted to be 7.0 today (previously 7.0), no signs of bleeding reported, will transfuse 1 unit PRBCs today  Objective   Blood pressure 113/63, pulse 65, temperature (!) 97 F (36.1 C), resp. rate 18, height 5' 7"  (1.702 m), weight 92.2 kg, SpO2 94 %.    Vent Mode: PRVC FiO2 (%):  [28 %-40 %] 40 % Set Rate:  [18 bmp] 18 bmp Vt Set:  [450 mL] 450 mL PEEP:  [5 cmH20] 5 cmH20 Plateau Pressure:  [19 cmH20] 19 cmH20   Intake/Output Summary (Last 24 hours) at 11/22/2021 0841 Last data filed at 11/22/2021 0750 Gross per 24 hour  Intake 2461.84 ml  Output 2130 ml  Net 331.84 ml   Filed Weights   11/20/21 1945 11/21/21 0318 11/22/21 0319  Weight: 92.4 kg 92.4  kg 92.2 kg    Examination: General: Acutely ill-appearing female, laying in bed, intubated and sedated, no acute distress HENT: Atraumatic, normocephalic, neck supple, no JVD Lungs: Coarse breath sounds bilaterally, even, synchronous with the vent, nonlabored Cardiovascular: Regular rate and rhythm, S1-S2, no murmurs, rubs, gallops Abdomen: Soft, nontender, nondistended, no guarding rebound tenderness, bowel sounds positive x4, peritoneal dialysis catheter clean dry and intact Extremities: Normal bulk and tone, no deformities, 2+ edema  bilateral lower extremities Neuro: Sedated, nursing reports patient intermittently with purposeful activity including reaching for the ET tube, however does not follow commands, pupils PERRLA GU: Foley catheter in place draining clear yellow urine  Resolved Hospital Problem list     Assessment & Plan:   Acute Hypoxic Respiratory Failure in setting of Cardiac arrest, pulmonary edema, bilateral pleural effusions -Full vent support, implement lung protective strategies -Plateau pressures less than 30 cm H20 -Wean FiO2 & PEEP as tolerated to maintain O2 sats >92% -Follow intermittent Chest X-ray & ABG as needed -Spontaneous Breathing Trials when respiratory parameters met and mental status permits -Implement VAP Bundle -Prn Bronchodilators -Volume removal with HD  Brief Cardiac Arrest, suspect due to Sepsis and metabolic acidosis with metabolic derangements Mildly Elevated Troponin, suspect demand ischemia -Continuous cardiac monitoring -Maintain MAP >65 -Vasopressors as needed to maintain MAP goal ~ curerntly not requiring -Trend lactic acid until normalized -HS Troponin peaked at 532 -Echocardiogram pending  Severe sepsis due to Infected PD Catheter (MRSA on outpatient cultures) ? Pneumonia on CT chest -Monitor fever curve -Trend WBC's & Procalcitonin -Follow cultures as above -Continue empiric Vancomycin & Ceftriaxone pending cultures & sensitivities -General Surgery following, appreciate input ~ plan for removal in OR today 9/15  ESRD on Peritoneal Dialysis, now transitioned to CRRT/HD Mild Hyponatremia Hypocalcemia -Monitor I&O's / urinary output -Follow BMP -Ensure adequate renal perfusion -Avoid nephrotoxic agents as able -Replace electrolytes as indicated -Nephrology following, appreciate input ~ CRRT discontinued 9/14, transitioned to intermittent HD  Anemia of chronic disease -Monitor for S/Sx of bleeding -Trend CBC -SCD's for VTE Prophylaxis (chemical  prophylaxis on hold for OR 9/15) -Transfuse for Hgb <7 -Will transfuse 1 unit pRBC 9/15  Hypoglycemia -CBG's q4h; Target range of 140 to 180 -D10 infusion while tube feeds on hold -Follow ICU Hypo/Hyperglycemia protocol  Acute Metabolic Encephalopathy Sedation needs in setting of mechanical ventilation CT Head negative 9/13; UDS positive for Benzodiazepines and Methadone -Maintain a RASS goal of 0 to -1 -Fentanyl and Propofol as needed to maintain RASS goal -Avoid sedating medications as able -Daily wake up assessment       Best Practice (right click and "Reselect all SmartList Selections" daily)   Diet/type: NPO DVT prophylaxis: SCD GI prophylaxis: PPI Lines: Dialysis Catheter and yes and it is still needed Foley:  Yes, and it is still needed Code Status:  full code Last date of multidisciplinary goals of care discussion [11/22/21]  Labs   CBC: Recent Labs  Lab 11/18/21 1949 11/20/21 1735 11/21/21 0228 11/21/21 1149 11/21/21 2347 11/22/21 0530  WBC 10.5 15.1* 12.3*  --   --  8.8  NEUTROABS  --  10.3* 10.4*  --   --   --   HGB 8.8* 9.3* 7.1* 7.4* 7.0* 7.0*  HCT 27.7* 30.7* 22.6* 23.3* 22.9* 22.8*  MCV 89.1 93.6 88.6  --   --  91.9  PLT 258 357 239  --   --  338    Basic Metabolic Panel: Recent Labs  Lab 11/20/21 1735 11/20/21 2155 11/21/21 0228  11/21/21 0413 11/21/21 1547 11/22/21 0530  NA 133* 130*  --  133* 136 133*  K 5.1 5.7*  --  4.3 3.5 4.1  CL 96* 93*  --  99 100 98  CO2 18* 21*  --  22 22 22   GLUCOSE 149* 151*  --  79 78 107*  BUN 70* 73*  --  64* 47* 51*  CREATININE 15.66* 16.14*  --  12.72* 9.45* 9.88*  CALCIUM 10.1 7.8*  --  8.0* 8.1* 7.6*  MG  --  2.1 2.0  --   --  1.8  PHOS  --   --   --  7.3* 5.3* 6.1*   GFR: Estimated Creatinine Clearance: 8.6 mL/min (A) (by C-G formula based on SCr of 9.88 mg/dL (H)). Recent Labs  Lab 11/18/21 1949 11/20/21 1735 11/20/21 2155 11/21/21 0228 11/21/21 1045 11/22/21 0530  PROCALCITON  --   --    --   --  1.49 4.54  WBC 10.5 15.1*  --  12.3*  --  8.8  LATICACIDVEN  --  7.8* 1.0  --   --   --     Liver Function Tests: Recent Labs  Lab 11/20/21 1735 11/21/21 0413 11/21/21 1547 11/22/21 0530  AST 43*  --   --   --   ALT 23  --   --   --   ALKPHOS 144*  --   --   --   BILITOT 0.3  --   --   --   PROT 6.3*  --   --   --   ALBUMIN 2.5* 2.0* 2.0* 1.8*   No results for input(s): "LIPASE", "AMYLASE" in the last 168 hours. No results for input(s): "AMMONIA" in the last 168 hours.  ABG    Component Value Date/Time   PHART 7.29 (L) 11/20/2021 1810   PCO2ART 37 11/20/2021 1810   PO2ART 170 (H) 11/20/2021 1810   HCO3 17.8 (L) 11/20/2021 1810   ACIDBASEDEF 8.1 (H) 11/20/2021 1810   O2SAT 99.2 11/20/2021 1810     Coagulation Profile: Recent Labs  Lab 11/20/21 1735  INR 1.4*    Cardiac Enzymes: No results for input(s): "CKTOTAL", "CKMB", "CKMBINDEX", "TROPONINI" in the last 168 hours.  HbA1C: Hemoglobin A1C  Date/Time Value Ref Range Status  04/27/2021 12:00 AM 5  Final    Comment:    care everywhere   Hgb A1c MFr Bld  Date/Time Value Ref Range Status  10/01/2021 02:55 AM 4.8 4.8 - 5.6 % Final    Comment:    (NOTE) Pre diabetes:          5.7%-6.4%  Diabetes:              >6.4%  Glycemic control for   <7.0% adults with diabetes     CBG: Recent Labs  Lab 11/22/21 0445 11/22/21 0542 11/22/21 0635 11/22/21 0722 11/22/21 0821  GLUCAP 106* 110* 96 99 80    Review of Systems:   Unable to assess due intubation/sedation/AMS   Past Medical History:  She,  has a past medical history of Abdominal mass, Arthritis, Chronic kidney disease, Colon polyp, Diabetes 1.5, managed as type 1 ([place]), Drug addiction ([place]), GERD (gastroesophageal reflux disease), Hypertension, Pelvic floor dysfunction, and Urine incontinence.   Surgical History:   Past Surgical History:  Procedure Laterality Date   ABDOMINAL HYSTERECTOMY  2005   CESAREAN SECTION  2003    COLONOSCOPY WITH PROPOFOL N/A 09/14/2015   Procedure: COLONOSCOPY WITH PROPOFOL;  Surgeon: Evangeline Gula  Allen Norris, MD;  Location: [place];  Service: Endoscopy;  Laterality: N/A;  Diabetic - insulin   CYSTOSCOPY W/ RETROGRADES Right 07/06/2017   Procedure: CYSTOSCOPY WITH RETROGRADE PYELOGRAM;  Surgeon: Hollice Espy, MD;  Location: [facility];  Service: Urology;  Laterality: Right;   CYSTOSCOPY W/ URETERAL STENT PLACEMENT Right 06/06/2017   Procedure: CYSTOSCOPY WITH RETROGRADE PYELOGRAM/URETERAL STENT PLACEMENT;  Surgeon: Lucas Mallow, MD;  Location: [facility];  Service: Urology;  Laterality: Right;   CYSTOSCOPY W/ URETERAL STENT PLACEMENT Right 07/06/2017   Procedure: CYSTOSCOPY WITH STENT REPLACEMENT;  Surgeon: Hollice Espy, MD;  Location: [facility];  Service: Urology;  Laterality: Right;   ESOPHAGOGASTRODUODENOSCOPY (EGD) WITH PROPOFOL N/A 06/19/2016   Procedure: ESOPHAGOGASTRODUODENOSCOPY (EGD) WITH PROPOFOL;  Surgeon: Jonathon Bellows, MD;  Location: [facility];  Service: Endoscopy;  Laterality: N/A;   LAPAROSCOPY     LYSIS OF ADHESION     POLYPECTOMY  09/14/2015   Procedure: POLYPECTOMY;  Surgeon: Lucilla Lame, MD;  Location: [place];  Service: Endoscopy;;   RIGHT OOPHORECTOMY  2012   TEMPORARY DIALYSIS CATHETER N/A 10/01/2021   Procedure: TEMPORARY DIALYSIS CATHETER;  Surgeon: Algernon Huxley, MD;  Location: [place] [facility];  Service: Cardiovascular;  Laterality: N/A;   TONSILLECTOMY  1987   and adenoids   TUBAL LIGATION  2004   VULVECTOMY  2011   lipoma     Social History:   reports that she has been smoking cigarettes. She has a 12.00 pack-year smoking history. She has never used smokeless tobacco. She reports that she does not drink alcohol and does not use drugs.   Family History:  Her family history includes Dementia in her maternal grandmother; Depression in her maternal grandmother and mother; Diabetes in her father, maternal grandmother, and paternal grandmother;  Heart disease in her maternal grandmother.   Allergies Allergies  Allergen Reactions   Cephalexin Other (See Comments) and Rash    Unknown   Penicillins Rash    Has patient had a PCN reaction causing immediate rash, facial/tongue/throat swelling, SOB or lightheadedness with hypotension: No Has patient had a PCN reaction causing severe rash involving mucus membranes or skin necrosis: No Has patient had a PCN reaction that required hospitalization: No Has patient had a PCN reaction occurring within the last 10 years: No If all of the above answers are "NO", then may proceed with Cephalosporin use.     Home Medications  Prior to Admission medications   Medication Sig Start Date End Date Taking? Authorizing Provider  aspirin EC 81 MG tablet Take 81 mg by mouth daily.   Yes [provider]  metolazone (ZAROXOLYN) 2.5 MG tablet Take 1 tablet (2.5 mg total) by mouth every other day. 10/29/21  Yes Jearld Fenton, NP  omeprazole (PRILOSEC) 20 MG capsule Take 1 capsule (20 mg total) by mouth daily. 08/07/21  Yes Jearld Fenton, NP  torsemide (DEMADEX) 100 MG tablet Take 1 tablet (100 mg total) by mouth daily. 11/19/21  Yes Hinda Kehr, MD  Accu-Chek Softclix Lancets lancets Use to check blood sugar daily for type 2 diabetes. E11.9 06/27/21   Jearld Fenton, NP  amLODipine (NORVASC) 10 MG tablet Take 1 tablet (10 mg total) by mouth daily. Patient not taking: Reported on 11/18/2021 10/04/21   Fritzi Mandes, MD  gentamicin cream (GARAMYCIN) 0.1 % Apply 1 Application topically daily. Patient not taking: Reported on 11/18/2021 10/04/21   Fritzi Mandes, MD  glucose blood (ACCU-CHEK GUIDE) test strip 1 each by  Other route See admin instructions. 07/08/21   Jearld Fenton, NP  labetalol (NORMODYNE) 100 MG tablet Take 1 tablet (100 mg total) by mouth 3 (three) times daily. Patient not taking: Reported on 11/18/2021 10/03/21   Fritzi Mandes, MD  levothyroxine (SYNTHROID) 25 MCG tablet Take 1 tablet (25 mcg  total) by mouth daily. Patient not taking: Reported on 11/18/2021 08/08/21   Jearld Fenton, NP  methadone (DOLOPHINE) 10 MG/ML solution Take 120 mg by mouth daily. Changed by Dr Posey Pronto to get the right home dose w/o sending rx Patient not taking: Reported on 11/20/2021    [provider]  naloxone [facility]) nasal spray 4 mg/0.1 mL One spray in either nostril once for known/suspected opioid overdose. May repeat every 2-3 minutes in alternating nostril til EMS arrives 04/29/21   [provider]     Critical care time: 40 minutes     Darel Hong, AGACNP-BC [facility] epic messenger for cross cover needs If after hours, please call E-link

## 2021-11-22 NOTE — Interval H&P Note (Signed)
History and Physical Interval Note:  11/22/2021 9:56 AM  Nichole Wiley  has presented today for surgery, with the diagnosis of PD Cath Removal.  The various methods of treatment have been discussed with the patient and family. After consideration of risks, benefits and other options for treatment, the patient has consented to  Procedure(s): CONTINUOUS AMBULATORY PERITONEAL DIALYSIS  (CAPD) CATHETER REMOVAL (N/A) as a surgical intervention.  The patient's history has been reviewed, patient examined, no change in status, stable for surgery.  I have reviewed the patient's chart and labs.  Questions were answered to the patient's satisfaction.     Candi Profit Lysle Pearl

## 2021-11-22 NOTE — Consult Note (Signed)
[place] for Electrolyte Monitoring and Replacement   Recent Labs: Potassium (mmol/L)  Date Value  11/22/2021 4.1  01/20/2012 3.5   Magnesium (mg/dL)  Date Value  11/22/2021 1.8   Calcium (mg/dL)  Date Value  11/22/2021 7.6 (L)   Calcium, Total (mg/dL)  Date Value  01/20/2012 8.7   Albumin (g/dL)  Date Value  11/22/2021 1.8 (L)  01/20/2012 2.9 (L)   Phosphorus (mg/dL)  Date Value  11/22/2021 6.1 (H)   Sodium (mmol/L)  Date Value  11/22/2021 133 (L)  01/20/2012 127 (L)   Assessment: [name] with medical history including ESRD on PD, 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 Patient is currently intubated, sedated, and on mechanical ventilation in the ICU.  Goal of Therapy:  Electrolytes within normal limits Preference K ~ 4 and Mg ~ 2 given cardiac arrest  Plan:  --Mg 1.8, will give magnesium sulfate 1 g IV x 1 --Follow-up electrolytes with AM labs tomorrow  Benita Gutter 11/22/2021 3:07 PM

## 2021-11-22 NOTE — Anesthesia Preprocedure Evaluation (Addendum)
Anesthesia Evaluation  Patient identified by MRN, date of birth, ID band Patient unresponsive    Reviewed: Allergy & Precautions, NPO status , Patient's Chart, lab work & pertinent test results  History of Anesthesia Complications Negative for: history of anesthetic complications  Airway Mallampati: Intubated       Dental  (+) Chipped   Pulmonary COPD, Current Smoker,     + decreased breath sounds      Cardiovascular hypertension, + Past MI  Normal cardiovascular exam     Neuro/Psych PSYCHIATRIC DISORDERS  Neuromuscular disease    GI/Hepatic Neg liver ROS, GERD  ,  Endo/Other  diabetesHypothyroidism   Renal/GU DialysisRenal disease     Musculoskeletal   Abdominal   Peds  Hematology negative hematology ROS (+)   Anesthesia Other Findings Past Medical History: No date: Abdominal mass No date: Arthritis     Comment:  back No date: Chronic kidney disease No date: Colon polyp No date: Diabetes 1.5, managed as type 1 ([place]) No date: Drug addiction ([place])     Comment:  hx (pain pills after c-sect) No date: GERD (gastroesophageal reflux disease) No date: Hypertension No date: Pelvic floor dysfunction No date: Urine incontinence  Past Surgical History: 2005: ABDOMINAL HYSTERECTOMY 2003: CESAREAN SECTION 09/14/2015: COLONOSCOPY WITH PROPOFOL; N/A     Comment:  Procedure: COLONOSCOPY WITH PROPOFOL;  Surgeon: Lucilla Lame, MD;  Location: [place];  Service:               Endoscopy;  Laterality: N/A;  Diabetic - insulin 07/06/2017: CYSTOSCOPY W/ RETROGRADES; Right     Comment:  Procedure: CYSTOSCOPY WITH RETROGRADE PYELOGRAM;                Surgeon: Hollice Espy, MD;  Location: [facility];                Service: Urology;  Laterality: Right; 06/06/2017: CYSTOSCOPY W/ URETERAL STENT PLACEMENT; Right     Comment:  Procedure: CYSTOSCOPY WITH RETROGRADE PYELOGRAM/URETERAL              STENT  PLACEMENT;  Surgeon: Lucas Mallow, MD;                Location: [facility];  Service: Urology;  Laterality:               Right; 07/06/2017: CYSTOSCOPY W/ URETERAL STENT PLACEMENT; Right     Comment:  Procedure: CYSTOSCOPY WITH STENT REPLACEMENT;  Surgeon:               Hollice Espy, MD;  Location: [facility];  Service:               Urology;  Laterality: Right; 06/19/2016: ESOPHAGOGASTRODUODENOSCOPY (EGD) WITH PROPOFOL; N/A     Comment:  Procedure: ESOPHAGOGASTRODUODENOSCOPY (EGD) WITH               PROPOFOL;  Surgeon: Jonathon Bellows, MD;  Location: [facility]               [facility];  Service: Endoscopy;  Laterality: N/A; No date: LAPAROSCOPY No date: LYSIS OF ADHESION 09/14/2015: POLYPECTOMY     Comment:  Procedure: POLYPECTOMY;  Surgeon: Lucilla Lame, MD;                Location: [place];  Service: Endoscopy;; 2012: RIGHT OOPHORECTOMY 10/01/2021: TEMPORARY DIALYSIS CATHETER; N/A     Comment:  Procedure: TEMPORARY DIALYSIS CATHETER;  Surgeon: Algernon Huxley, MD;  Location: [place] [facility];  Service:               Cardiovascular;  Laterality: N/A; 1987: TONSILLECTOMY     Comment:  and adenoids 2004: TUBAL LIGATION 2011: VULVECTOMY     Comment:  lipoma  BMI    Body Mass Index: 31.84 kg/m      Reproductive/Obstetrics negative OB ROS                           Anesthesia Physical Anesthesia Plan  ASA: 4  Anesthesia Plan: General ETT   Post-op Pain Management:    Induction: Intravenous  PONV Risk Score and Plan: Ondansetron, Dexamethasone, Midazolam and Treatment may vary due to age or medical condition  Airway Management Planned: Oral ETT  Additional Equipment:   Intra-op Plan:   Post-operative Plan: Extubation in OR  Informed Consent: I have reviewed the patients History and Physical, chart, labs and discussed the procedure including the risks, benefits and alternatives for the proposed anesthesia with the patient or  authorized representative who has indicated his/her understanding and acceptance.     Dental Advisory Given  Plan Discussed with: Anesthesiologist, CRNA and Surgeon  Anesthesia Plan Comments: (History and phone consent from the patients mother Clarisse Gouge at   [phone]   Mother consented for risks of anesthesia including but not limited to:  - adverse reactions to medications - damage to eyes, teeth, lips or other oral mucosa - nerve damage due to positioning  - sore throat or hoarseness - Damage to heart, brain, nerves, lungs, other parts of body or loss of life  She voiced understanding.)       Anesthesia Quick Evaluation

## 2021-11-22 NOTE — Consult Note (Signed)
Pharmacy Antibiotic Note  Nichole Wiley is a 41 y.o. female with PMH including ESRD on PD admitted on 11/20/2021 with  acute respiratory failure complicated by cardiac arrest in the ED with downtime of about 8 minutes . She also has infected PD catheter infection. General surgery consulted and removed the infected PD catheter 9/15.  Pharmacy has been consulted for vancomycin dosing. Patient is also ordered ceftriaxone.  Plan: Vancomycin 1 g IV given 9/12 at 0130 Vancomycin 1.5 g IV given 9/13 at 2016 9/14 0413: Vancomycin level: 33 9/15 0530 Vancomycin level: 28  Patient to undergo first HD session 9/15; 3 hour treatment at BFR 400 mL/min  Will check vancomycin random level 9/16 AM to assess need for supplemental dosing  Height: '5\' 7"'$  (170.2 cm) Weight: 97.4 kg (214 lb 11.7 oz) IBW/kg (Calculated) : 61.6  Temp (24hrs), Avg:97.6 F (36.4 C), Min:96.8 F (36 C), Max:99.1 F (37.3 C)  Recent Labs  Lab 11/18/21 1949 11/20/21 1735 11/20/21 2155 11/21/21 0228 11/21/21 0413 11/21/21 1547 11/22/21 0530  WBC 10.5 15.1*  --  12.3*  --   --  8.8  CREATININE 16.10* 15.66* 16.14*  --  12.72* 9.45* 9.88*  LATICACIDVEN  --  7.8* 1.0  --   --   --   --   VANCORANDOM  --   --   --   --  33  --  28    Estimated Creatinine Clearance: 8.8 mL/min (A) (by C-G formula based on SCr of 9.88 mg/dL (H)).    Allergies  Allergen Reactions   Cephalexin Other (See Comments) and Rash    Unknown   Penicillins Rash    Has patient had a PCN reaction causing immediate rash, facial/tongue/throat swelling, SOB or lightheadedness with hypotension: No Has patient had a PCN reaction causing severe rash involving mucus membranes or skin necrosis: No Has patient had a PCN reaction that required hospitalization: No Has patient had a PCN reaction occurring within the last 10 years: No If all of the above answers are "NO", then may proceed with Cephalosporin use.    Antimicrobials this admission: Vancomycin  9/12 >>  Cefepime 9/13 >> 9/14 Ceftriaxone 9/15 >>  Dose adjustments this admission: N/A  Microbiology results: 9/13 BCx: NGTD 9/13 UCx: NG  9/13 MRSA PCR: (+) 9/15 Surgical cultures: pending  Thank you for allowing pharmacy to be a part of this patient's care.  Benita Gutter 11/22/2021 2:59 PM

## 2021-11-22 NOTE — Progress Notes (Signed)
Updated pt's mother and daughter at bedside regarding plan of care.  All questions answered to their satisfaction.     Darel Hong, AGACNP-BC [facility] Prefer epic messenger for cross cover needs If after hours, please call E-link

## 2021-11-22 NOTE — Transfer of Care (Signed)
Immediate Anesthesia Transfer of Care Note  Patient: Nichole Wiley  Procedure(s) Performed: CONTINUOUS AMBULATORY PERITONEAL DIALYSIS  (CAPD) CATHETER REMOVAL (Abdomen)  Patient Location: PACU  Anesthesia Type:General  Level of Consciousness: sedated  Airway & Oxygen Therapy: pt placed on ventilator on previous settings  Post-op Assessment: Report given to RN and Post -op Vital signs reviewed and stable  Post vital signs: Reviewed and stable  Last Vitals:  Vitals Value Taken Time  BP 119/66 11/22/21 1057  Temp    Pulse 66 11/22/21 1057  Resp 16 11/22/21 1057  SpO2 95 % 11/22/21 1057    Last Pain:  Vitals:   11/22/21 0600  TempSrc: Bladder  PainSc:          Complications: No notable events documented.

## 2021-11-22 NOTE — Op Note (Signed)
Pre-Op Dx: infected PD catheter Post-Op Dx: same Anesthesia: local EBL: 36m Complications:  none apparent Specimen: PD catheter Procedure: excisional biopsy removal of infected PD catheter Surgeon: SLysle Pearl Indications for procedure: See consult note  Description of Procedure:  Area sterilized and draped in usual position.  Local infused to former periumbilical incision where catheter entered abdominal cavity based on OP note review.  4cm incision made through dermis with 15blade and catheter noted in subcutaneous layer.  The catheter and associated cuff dissected from surrouding tissue, and with gentle traction, the itna-abdominal portion was removed completely with minimal tension needed.  Purulent fluid around catheter noted in subcutaneous layer, but straw colored fluid noted from intra-abdominal portion. Tip cut and sent off field pending cultures.  Remaining catheter within subcutaneous tunnel also noted to easily be removed with minimal tension.  Tract irrigated extensively until clear output noted.  Wound hemostasis noted, then incision closed in two layer fashion with 3-0 vicryl in interrupted fashion for deep dermal layer, then staples.  Wick placed in between staples to facilitate drainage. Wound then dressed with honeycomb dressing.  Catheter exit site in RUQ dressed with gauze and tape.  Pt tolerated procedure well, and transferred to ICU directly in stable condition. Sponge and instrument count correct at end of procedure.

## 2021-11-22 NOTE — Progress Notes (Signed)
[place], [place] 11/22/21  Subjective:   Hospital day # 2  Patient known to our practice from outpatient dialysis.  Last few weeks she had purulent exit site infection with MRSA.  She was treated with intraperitoneal vancomycin and advised to get the catheter removed from her surgeon.  Patient missed her surgery appointment. Presented to the emergency room on 9/13 with severe shortness of breath, worsening edema and became unresponsive in the emergency room and had to be placed on the ventilator.   CT angiogram negative for acute pulmonary embolism, moderate to large bilateral pleural effusions, peribronchovascular infiltrates noted.  Buckle fracture of the anterior sternal wall.  Cardiomegaly.  Aortic and coronary artery calcifications noted.  Patient remains critically ill, intubated and sedated.  She is scheduled for PD catheter removal later today.  Continues to have large amount of lower extremity edema.  Ventilator settings FiO2 40%, PEEP 5.  Getting D5 half-normal saline at 50 cc/h to maintain blood sugars and it was lower earlier.  CRRT was discontinued on Thursday.  Patient remains off pressors.   Objective:  Vital signs in last 24 hours:  Temp:  [97 F (36.1 C)-99.1 F (37.3 C)] 97 F (36.1 C) (09/15 0900) Pulse Rate:  [65-76] 66 (09/15 0900) Resp:  [18] 18 (09/15 0900) BP: (101-151)/(53-77) 115/65 (09/15 0900) SpO2:  [91 %-98 %] 95 % (09/15 0900) FiO2 (%):  [28 %-40 %] 40 % (09/15 0725) Weight:  [92.2 kg] 92.2 kg (09/15 0319)  Weight change: -0.2 kg Filed Weights   11/20/21 1945 11/21/21 0318 11/22/21 0319  Weight: 92.4 kg 92.4 kg 92.2 kg    Intake/Output:    Intake/Output Summary (Last 24 hours) at 11/22/2021 0939 Last data filed at 11/22/2021 0750 Gross per 24 hour  Intake 2408.24 ml  Output 2026 ml  Net 382.24 ml      Physical Exam: General: Critically ill-appearing,  HEENT ET tube in place  Pulm/lungs Vent related assisted,  FiO2 35%/PEEP 5  CVS/Heart Regular rhythm  Abdomen:  Nondistended, PD catheter in place  Extremities: 2+ pitting edema  Neurologic: Sedated  Skin: No acute rashes  Access: Right IJ temporary catheter, PD catheter in place       Basic Metabolic Panel:  Recent Labs  Lab 11/20/21 1735 11/20/21 2155 11/21/21 0228 11/21/21 0413 11/21/21 1547 11/22/21 0530  NA 133* 130*  --  133* 136 133*  K 5.1 5.7*  --  4.3 3.5 4.1  CL 96* 93*  --  99 100 98  CO2 18* 21*  --  '22 22 22  '$ GLUCOSE 149* 151*  --  79 78 107*  BUN 70* 73*  --  64* 47* 51*  CREATININE 15.66* 16.14*  --  12.72* 9.45* 9.88*  CALCIUM 10.1 7.8*  --  8.0* 8.1* 7.6*  MG  --  2.1 2.0  --   --  1.8  PHOS  --   --   --  7.3* 5.3* 6.1*      CBC: Recent Labs  Lab 11/18/21 1949 11/20/21 1735 11/21/21 0228 11/21/21 1149 11/21/21 2347 11/22/21 0530  WBC 10.5 15.1* 12.3*  --   --  8.8  NEUTROABS  --  10.3* 10.4*  --   --   --   HGB 8.8* 9.3* 7.1* 7.4* 7.0* 7.0*  HCT 27.7* 30.7* 22.6* 23.3* 22.9* 22.8*  MCV 89.1 93.6 88.6  --   --  91.9  PLT 258 357 239  --   --  184       Lab Results  Component Value Date   HEPBSAG NON REACTIVE 11/21/2021   HEPBIGM NON REACTIVE 10/01/2021      Microbiology:  Recent Results (from the past 240 hour(s))  Blood Culture (routine x 2)     Status: None (Preliminary result)   Collection Time: 11/20/21  5:54 PM   Specimen: BLOOD  Result Value Ref Range Status   Specimen Description BLOOD LEFT ANTECUBITAL  Final   Special Requests   Final    BOTTLES DRAWN AEROBIC AND ANAEROBIC Blood Culture adequate volume   Culture   Final    NO GROWTH 2 DAYS Performed at [facility], [address], [place], [place] [address]    Report Status PENDING  Incomplete  SARS Coronavirus 2 by RT PCR (hospital order, performed in [place] hospital lab) *cepheid single result test* Anterior Nasal Swab     Status: None   Collection Time: 11/20/21  6:14 PM   Specimen: Anterior Nasal Swab   Result Value Ref Range Status   SARS Coronavirus 2 by RT PCR NEGATIVE NEGATIVE Final    Comment: (NOTE) SARS-CoV-2 target nucleic acids are NOT DETECTED.  The SARS-CoV-2 RNA is generally detectable in upper and lower respiratory specimens during the acute phase of infection. The lowest concentration of SARS-CoV-2 viral copies this assay can detect is 250 copies / mL. A negative result does not preclude SARS-CoV-2 infection and should not be used as the sole basis for treatment or other patient management decisions.  A negative result may occur with improper specimen collection / handling, submission of specimen other than nasopharyngeal swab, presence of viral mutation(s) within the areas targeted by this assay, and inadequate number of viral copies (<250 copies / mL). A negative result must be combined with clinical observations, patient history, and epidemiological information.  Fact Sheet for Patients:   https://www.patel.info/  Fact Sheet for Healthcare Providers: https://hall.com/  This test is not yet approved or  cleared by the [place] FDA and has been authorized for detection and/or diagnosis of SARS-CoV-2 by FDA under an Emergency Use Authorization (EUA).  This EUA will remain in effect (meaning this test can be used) for the duration of the COVID-19 declaration under Section 564(b)(1) of the Act, 21 U.S.C. section 360bbb-3(b)(1), unless the authorization is terminated or revoked sooner.  Performed at [facility], [address] [place], [place] [address]   Urine Culture     Status: None   Collection Time: 11/20/21  6:30 PM   Specimen: Urine, Random  Result Value Ref Range Status   Specimen Description   Final    URINE, RANDOM Performed at [facility], [address] [place], [place] [address]    Special Requests   Final    NONE Performed at [facility], [address],  [place], [place] [address]    Culture   Final    NO GROWTH Performed at [facility], [place] [address] [place],  [address]    Report Status 11/22/2021 FINAL  Final  MRSA Next Gen by PCR, Nasal     Status: Abnormal   Collection Time: 11/20/21  7:50 PM   Specimen: Nasal Mucosa; Nasal Swab  Result Value Ref Range Status   MRSA by PCR Next Gen DETECTED (A) NOT DETECTED Final    Comment: RESULT CALLED TO, READ BACK BY AND VERIFIED WITH: BETH BUONO AT 2210 ON 11/20/21 BY SS (NOTE) The GeneXpert MRSA Assay (  FDA approved for NASAL specimens only), is one component of a comprehensive MRSA colonization surveillance program. It is not intended to diagnose MRSA infection nor to guide or monitor treatment for MRSA infections. Test performance is not FDA approved in patients less than 42 years old. Performed at [facility], [address] [place], [place] [address]   Blood Culture (routine x 2)     Status: None (Preliminary result)   Collection Time: 11/20/21  9:55 PM   Specimen: BLOOD  Result Value Ref Range Status   Specimen Description BLOOD CENTRAL LINE  Final   Special Requests   Final    BOTTLES DRAWN AEROBIC AND ANAEROBIC Blood Culture results may not be optimal due to an inadequate volume of blood received in culture bottles   Culture   Final    NO GROWTH 2 DAYS Performed at [facility], [address] [place], [place] [address]    Report Status PENDING  Incomplete    Coagulation Studies: Recent Labs    11/20/21 1735  LABPROT 16.6*  INR 1.4*     Urinalysis: Recent Labs    11/20/21 1830  COLORURINE STRAW*  LABSPEC 1.009  PHURINE 8.0  GLUCOSEU 50*  HGBUR NEGATIVE  BILIRUBINUR NEGATIVE  KETONESUR NEGATIVE  PROTEINUR 100*  NITRITE NEGATIVE  LEUKOCYTESUR NEGATIVE       Imaging: DG Abd 1 View  Result Date: 11/20/2021 CLINICAL DATA:  Status post OG tube placement. EXAM: ABDOMEN - 1 VIEW COMPARISON:  November 04, 2021 FINDINGS: An  orogastric tube is seen with its distal tip overlying the body of the stomach. The distal side hole sits approximately 5.1 cm distal to the expected region of the gastroesophageal junction. The bowel gas pattern is normal. No radio-opaque calculi or other significant radiographic abnormality are seen. IMPRESSION: Orogastric tube positioning, as described above. Electronically Signed   By: Virgina Norfolk M.D.   On: 11/20/2021 21:45   DG Chest 1 View  Result Date: 11/20/2021 CLINICAL DATA:  Status post line placement. EXAM: CHEST  1 VIEW COMPARISON:  November 20, 2021 FINDINGS: There is stable endotracheal tube and nasogastric tube positioning. Interval right internal jugular venous catheter placement is seen with its distal tip at the junction of the superior vena cava and right atrium. The cardiac silhouette is mildly enlarged and unchanged in size. Stable marked severity bilateral airspace disease is seen. Small bilateral pleural effusions are again noted. No pneumothorax is identified. The visualized skeletal structures are unremarkable. IMPRESSION: 1. Interval right internal jugular venous catheter placement positioning, as described above. 2. Stable endotracheal tube and nasogastric tube positioning. Withdrawal of the ET tube by approximately 2 cm is recommended. 3. Stable marked severity bilateral airspace disease. Electronically Signed   By: Virgina Norfolk M.D.   On: 11/20/2021 21:44   CT Angio Chest PE W/Cm &/Or Wo Cm  Result Date: 11/20/2021 CLINICAL DATA:  Cardiac arrest; respiratory distress rule out PE EXAM: CT ANGIOGRAPHY CHEST WITH CONTRAST TECHNIQUE: Multidetector CT imaging of the chest was performed using the standard protocol during bolus administration of intravenous contrast. Multiplanar CT image reconstructions and MIPs were obtained to evaluate the vascular anatomy. RADIATION DOSE REDUCTION: This exam was performed according to the departmental dose-optimization program which  includes automated exposure control, adjustment of the mA and/or kV according to patient size and/or use of iterative reconstruction technique. CONTRAST:  51m OMNIPAQUE IOHEXOL 350 MG/ML SOLN COMPARISON:  Radiographs earlier today FINDINGS: Cardiovascular: Satisfactory opacification of the pulmonary arteries to the segmental  level. No evidence of pulmonary embolism. Mild cardiomegaly. Trace pericardial effusion. Aortic and coronary artery atherosclerotic calcification. Mediastinum/Nodes: No enlarged mediastinal, hilar, or axillary lymph nodes. ETT within the low intrathoracic trachea projecting toward the right mainstem bronchus with tip 6 mm from the carina. Subdiaphragmatic enteric tube. Lungs/Pleura: Moderate-large bilateral pleural effusions and associated atelectasis/consolidation. Interlobular septal thickening greatest in the upper lungs. Diffuse peribronchovascular consolidation. Upper Abdomen: No acute abnormality. Musculoskeletal: Buckle fracture of the anterior sternal wall. The posterior sternal wall is intact. No acute rib fracture. Review of the MIP images confirms the above findings. IMPRESSION: 1. Negative for acute pulmonary embolism. 2. Moderate-large bilateral pleural effusions. 3. Peribronchovascular infiltrates bilaterally favored infectious/inflammatory likely with superimposed edema. 4. The ETT tip is within the low intrathoracic trachea approximately 6 mm from the carina. Recommend repositioning. 5. Buckle fracture of the anterior sternal wall. The posterior sternal wall is intact. 6. Cardiomegaly. 7. Aortic and coronary artery atherosclerotic calcification Electronically Signed   By: Placido Sou M.D.   On: 11/20/2021 19:40   CT HEAD WO CONTRAST (5MM)  Result Date: 11/20/2021 CLINICAL DATA:  New onset seizure EXAM: CT HEAD WITHOUT CONTRAST TECHNIQUE: Contiguous axial images were obtained from the base of the skull through the vertex without intravenous contrast. RADIATION DOSE  REDUCTION: This exam was performed according to the departmental dose-optimization program which includes automated exposure control, adjustment of the mA and/or kV according to patient size and/or use of iterative reconstruction technique. COMPARISON:  None Available. FINDINGS: Brain: No evidence of acute infarction, hemorrhage, hydrocephalus, extra-axial collection or mass lesion/mass effect. Vascular: Negative for hyperdense vessel Skull: Negative Sinuses/Orbits: Paranasal sinuses clear.  Negative orbit Other: None IMPRESSION: Negative CT head Electronically Signed   By: Franchot Gallo M.D.   On: 11/20/2021 19:34   DG Chest Portable 1 View  Result Date: 11/20/2021 CLINICAL DATA:  Intubation.  ET tube, OG tube placement EXAM: PORTABLE CHEST 1 VIEW COMPARISON:  11/18/2021 FINDINGS: Endotracheal tube 1.5 cm above the carina. OG tube is in the stomach. Cardiomegaly. Diffuse bilateral airspace disease, worsening since prior study, favor edema. Suspect small right effusion. No acute bony abnormality. IMPRESSION: Worsening bilateral airspace disease most likely edema/CHF. Small right effusion. Electronically Signed   By: Rolm Baptise M.D.   On: 11/20/2021 18:13     Medications:    sodium chloride     sodium chloride Stopped (11/20/21 2201)   ceFEPime (MAXIPIME) IV     dextrose 50 mL/hr at 11/22/21 0000   fentaNYL infusion INTRAVENOUS 200 mcg/hr (11/22/21 0849)   propofol (DIPRIVAN) infusion 70 mcg/kg/min (11/22/21 0846)    Chlorhexidine Gluconate Cloth  6 each Topical Q0600   docusate  100 mg Per Tube BID   free water  20 mL Per Tube Q6H   multivitamin  1 tablet Per Tube QHS   multivitamin  15 mL Per Tube Daily   mupirocin ointment  1 Application Nasal BID   mouth rinse  15 mL Mouth Rinse Q2H   pantoprazole  40 mg Per Tube Daily   polyethylene glycol  17 g Per Tube Daily   sodium chloride flush  3 mL Intravenous Q12H   vancomycin variable dose per unstable renal function (pharmacist dosing)    Does not apply See admin instructions   sodium chloride, acetaminophen, docusate sodium, fentaNYL, heparin, heparin, midazolam, ondansetron (ZOFRAN) IV, mouth rinse, polyethylene glycol, sodium chloride flush  Assessment/ Plan:  41 y.o. female with end-stage renal disease on peritoneal dialysis chronic pain syndrome, history of diabetes,  now diet controlled, hypertension, hyperlipidemia    admitted on 11/20/2021 for Cardiac arrest [facility]) [I46.9]  #End-stage renal disease with generalized edema Peritoneal dialysis with severe MRSA exit site infection.  Currently being treated with vancomycin IV.  Random level this morning is 33.  General surgery has been consulted for removal of PD catheter due to MRSA infection. Patient now has a temporary dialysis catheter.  She required CRRT earlier in the admission.  It was discontinued on Thursday.  We plan to do intermittent hemodialysis today primarily for volume removal.  Patient will also benefit from dialysis on Saturday.  #MRSA exit site infection (11/07/21) PD catheter to be removed today.  #Anemia of chronic kidney disease Lab Results  Component Value Date   HGB 7.0 (L) 11/22/2021    We will prescribe Epogen with hemodialysis. Received blood transfusion this admission.  Secondary hyperparathyroidism of renal origin Lab Results  Component Value Date   CALCIUM 7.6 (L) 11/22/2021   PHOS 6.1 (H) 11/22/2021   Patient with hypocalcemia and hyperphosphatemia. We will monitor closely.  May need to start binders once able to eat a normal diet.      LOS: 2 Amerigo Mcglory 9/15/20239:39 AM  [facility] [place], [place]  Note: This note was prepared with Dragon dictation. Any transcription errors are unintentional

## 2021-11-22 NOTE — Anesthesia Postprocedure Evaluation (Signed)
Anesthesia Post Note  Patient: Nichole Wiley  Procedure(s) Performed: CONTINUOUS AMBULATORY PERITONEAL DIALYSIS  (CAPD) CATHETER REMOVAL (Abdomen)  Patient location during evaluation: SICU Anesthesia Type: General Level of consciousness: sedated Pain management: pain level controlled Vital Signs Assessment: post-procedure vital signs reviewed and stable Respiratory status: patient remains intubated per anesthesia plan Cardiovascular status: stable Postop Assessment: no apparent nausea or vomiting Anesthetic complications: no   No notable events documented.   Last Vitals:  Vitals:   11/22/21 1057 11/22/21 1130  BP: 119/66 (!) 104/58  Pulse: 66 65  Resp: 16 18  Temp:  (!) 36.1 C  SpO2: 95% 93%    Last Pain:  Vitals:   11/22/21 0600  TempSrc: Bladder  PainSc:                  Precious Haws Curtiss Mahmood

## 2021-11-22 NOTE — Progress Notes (Signed)
Hd tx of 3.5hrs completed. 74L total vol processed. 1UPRBC administered, no pt reaction noted. No complications. Report given to [facility] [place] singlet[facility], rn.  Total uf removed: 3087m Post hd wt: 94.4kg Post hd v/s: 97.2 143/74(95) 66 18 96%

## 2021-11-23 ENCOUNTER — Inpatient Hospital Stay: Payer: Medicaid Other

## 2021-11-23 ENCOUNTER — Inpatient Hospital Stay (HOSPITAL_COMMUNITY)
Admit: 2021-11-23 | Discharge: 2021-11-23 | Disposition: A | Discharge status: Home / Self Care | Payer: Medicaid Other | Attending: Surgery | Admitting: Surgery

## 2021-11-23 DIAGNOSIS — I469 Cardiac arrest, cause unspecified: Secondary | ICD-10-CM

## 2021-11-23 DIAGNOSIS — T8571XA Infection and inflammatory reaction due to peritoneal dialysis catheter, initial encounter: Secondary | ICD-10-CM

## 2021-11-23 DIAGNOSIS — J9601 Acute respiratory failure with hypoxia: Secondary | ICD-10-CM | POA: Diagnosis present

## 2021-11-23 LAB — RENAL FUNCTION PANEL
Albumin: 1.8 g/dL — ABNORMAL LOW (ref 3.5–5.0)
Albumin: 1.9 g/dL — ABNORMAL LOW (ref 3.5–5.0)
Anion gap: 11 (ref 5–15)
Anion gap: 10 (ref 5–15)
BUN: 30 mg/dL — ABNORMAL HIGH (ref 6–20)
BUN: 19 mg/dL (ref 6–20)
CO2: 24 mmol/L (ref 22–32)
CO2: 24 mmol/L (ref 22–32)
Calcium: 7 mg/dL — ABNORMAL LOW (ref 8.9–10.3)
Calcium: 7.2 mg/dL — ABNORMAL LOW (ref 8.9–10.3)
Chloride: 96 mmol/L — ABNORMAL LOW (ref 98–111)
Chloride: 96 mmol/L — ABNORMAL LOW (ref 98–111)
Creatinine, Ser: 6.2 mg/dL — ABNORMAL HIGH (ref 0.44–1.00)
Creatinine, Ser: 4.49 mg/dL — ABNORMAL HIGH (ref 0.44–1.00)
GFR, Estimated: 8 mL/min — ABNORMAL LOW (ref >60)
GFR, Estimated: 12 mL/min — ABNORMAL LOW (ref >60)
Glucose, Bld: 101 mg/dL — ABNORMAL HIGH (ref 70–99)
Glucose, Bld: 130 mg/dL — ABNORMAL HIGH (ref 70–99)
Phosphorus: 4.1 mg/dL (ref 2.5–4.6)
Phosphorus: 3.6 mg/dL (ref 2.5–4.6)
Potassium: 3.7 mmol/L (ref 3.5–5.1)
Potassium: 3.7 mmol/L (ref 3.5–5.1)
Sodium: 131 mmol/L — ABNORMAL LOW (ref 135–145)
Sodium: 130 mmol/L — ABNORMAL LOW (ref 135–145)

## 2021-11-23 LAB — GLUCOSE, CAPILLARY
Glucose-Capillary: 106 mg/dL — ABNORMAL HIGH (ref 70–99)
Glucose-Capillary: 112 mg/dL — ABNORMAL HIGH (ref 70–99)
Glucose-Capillary: 102 mg/dL — ABNORMAL HIGH (ref 70–99)
Glucose-Capillary: 101 mg/dL — ABNORMAL HIGH (ref 70–99)
Glucose-Capillary: 80 mg/dL (ref 70–99)
Glucose-Capillary: 97 mg/dL (ref 70–99)
Glucose-Capillary: 104 mg/dL — ABNORMAL HIGH (ref 70–99)
Glucose-Capillary: 131 mg/dL — ABNORMAL HIGH (ref 70–99)
Glucose-Capillary: 117 mg/dL — ABNORMAL HIGH (ref 70–99)
Glucose-Capillary: 134 mg/dL — ABNORMAL HIGH (ref 70–99)
Glucose-Capillary: 125 mg/dL — ABNORMAL HIGH (ref 70–99)
Glucose-Capillary: 143 mg/dL — ABNORMAL HIGH (ref 70–99)
Glucose-Capillary: 145 mg/dL — ABNORMAL HIGH (ref 70–99)
Glucose-Capillary: 145 mg/dL — ABNORMAL HIGH (ref 70–99)
Glucose-Capillary: 157 mg/dL — ABNORMAL HIGH (ref 70–99)
Glucose-Capillary: 181 mg/dL — ABNORMAL HIGH (ref 70–99)

## 2021-11-23 LAB — CBC
HCT: 28.1 % — ABNORMAL LOW (ref 36.0–46.0)
Hemoglobin: 8.9 g/dL — ABNORMAL LOW (ref 12.0–15.0)
MCH: 28.5 pg (ref 26.0–34.0)
MCHC: 31.7 g/dL (ref 30.0–36.0)
MCV: 90.1 fL (ref 80.0–100.0)
Platelets: 155 10*3/uL (ref 150–400)
RBC: 3.12 MIL/uL — ABNORMAL LOW (ref 3.87–5.11)
RDW: 15.2 % (ref 11.5–15.5)
WBC: 8.7 10*3/uL (ref 4.0–10.5)
nRBC: 0 % (ref 0.0–0.2)

## 2021-11-23 LAB — PROCALCITONIN: Procalcitonin: 4.32 ng/mL

## 2021-11-23 LAB — ECHOCARDIOGRAM COMPLETE
AR max vel: 2.35 cm2
AV Area VTI: 2.39 cm2
AV Area mean vel: 2.27 cm2
AV Mean grad: 3 mmHg
AV Peak grad: 6.1 mmHg
Ao pk vel: 1.23 m/s
Area-P 1/2: 4.49 cm2
Calc EF: 43.8 %
Height: 67 in
S' Lateral: 3.3 cm
Single Plane A2C EF: 41 %
Single Plane A4C EF: 46.6 %
Weight: 3329.83 oz

## 2021-11-23 LAB — ALBUMIN: Albumin: 2 g/dL — ABNORMAL LOW (ref 3.5–5.0)

## 2021-11-23 LAB — HEPATITIS B SURFACE ANTIBODY, QUANTITATIVE: Hep B S AB Quant (Post): <3.1 m[IU]/mL — ABNORMAL LOW (ref >9.9)

## 2021-11-23 LAB — MAGNESIUM: Magnesium: 1.7 mg/dL (ref 1.7–2.4)

## 2021-11-23 LAB — VANCOMYCIN, RANDOM: Vancomycin Rm: 19 ug/mL

## 2021-11-23 MED ORDER — MAGNESIUM SULFATE 2 GM/50ML IV SOLN
2.0000 g | Freq: Once | INTRAVENOUS | Status: AC
  Administered 2021-11-23: 2 g via INTRAVENOUS
  Filled 2021-11-23: qty 50

## 2021-11-23 MED ORDER — VITAL HIGH PROTEIN PO LIQD
1000.0000 mL | ORAL | Status: DC
  Administered 2021-11-23: 1000 mL

## 2021-11-23 MED ORDER — VANCOMYCIN HCL IN DEXTROSE 1-5 GM/200ML-% IV SOLN
1000.0000 mg | Freq: Once | INTRAVENOUS | Status: AC
  Administered 2021-11-23: 1000 mg via INTRAVENOUS
  Filled 2021-11-23: qty 200

## 2021-11-23 MED ORDER — HEPARIN SODIUM (PORCINE) 1000 UNIT/ML IJ SOLN
INTRAMUSCULAR | Status: AC
  Administered 2021-11-23: 1000 [IU]
  Filled 2021-11-23: qty 10

## 2021-11-23 MED ORDER — DEXMEDETOMIDINE HCL IN NACL 400 MCG/100ML IV SOLN
0.4000 ug/kg/h | INTRAVENOUS | Status: DC
  Administered 2021-11-23 (×2): 1.2 ug/kg/h via INTRAVENOUS
  Administered 2021-11-23: 0.6 ug/kg/h via INTRAVENOUS
  Filled 2021-11-23 (×3): qty 100

## 2021-11-23 NOTE — Progress Notes (Signed)
Dressings changed from RUQ.  Minimal drainage, no fluctuance or induration or erythema.  Removed left lateral dressing.  Other honeycomb dressings intact.   New drsg applied at RUQ site.   Will follow.

## 2021-11-23 NOTE — Consult Note (Signed)
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 Patient is currently intubated, sedated, and on mechanical ventilation in the ICU.  Goal of Therapy:  Electrolytes within normal limits Preference K ~ 4 and Mg ~ 2 given cardiac arrest  Plan:  --Mg 1.7, will give magnesium sulfate 2 g IV x 1 --Follow-up electrolytes with AM labs tomorrow  Benita Gutter 11/23/2021 8:00 AM

## 2021-11-23 NOTE — Progress Notes (Signed)
NAME:  Nichole Wiley, MRN:  [id number], DOB:  1980-10-07, LOS: 3 ADMISSION DATE:  11/20/2021, CONSULTATION DATE:  11/20/2021 REFERRING MD:  Dr. Jacelyn Grip, CHIEF COMPLAINT:  Cardiac Arrest   Brief Pt Description / Synopsis:    History of Present Illness:  41 y.o. female whose medical history is most notable for chronic end-stage renal disease on peritoneal dialysis, as well as chronic pain syndrome, pelvic floor dysfunction, prior history of opioid addiction who presented to the hospital on 9/11 for the evaluation of infected peritoneal dialysis cathter. Her nephrologist is [name] who sent her in for an evaluation. She was discharged home the following day and re-admitted on 9/13 to the ED and suffered a cardiac arrest. She was intubated and started on CRRT given multiple electrolyte abnormalities and worsening renal function.     She was supposed to have the peritoneal dialysis catheter removed in favor of a temporary hemodialysis catheter, but that had to be canceled because her fianc just died and she was to have the funeral in about 3 days.  Patient admitted to the ICU where she was continued on CRRT. Yesterday, her PD cathter was removed and pus was drained by surgery. She was started on HD which she tolerated well.  Pertinent  Medical History   Past Medical History:  Diagnosis Date   Abdominal mass    Arthritis    back   Chronic kidney disease    Colon polyp    Diabetes 1.5, managed as type 1 ([place])    Drug addiction ([place])    hx (pain pills after c-sect)   GERD (gastroesophageal reflux disease)    Hypertension    Pelvic floor dysfunction    Urine incontinence     Micro Data:  9/13: SARS-CoV-2 PCR>> negative 9/13: Blood culture x2>> no growth to date 9/13: Urine>> no growth 9/13: MRSA PCR>> positive 9/14: Hepatitis panel>> nonreactive  Antimicrobials:  Cefepime 9/13>> 9/15 Vancomycin 9/13>> Ceftriaxone 9/15>>  Significant Hospital Events: Including procedures,  antibiotic start and stop dates in addition to other pertinent events   9/11 seen for  possible infected catheter in the ER 9/12 discharged home from ER 9/13 presented to ER with cardiac arrest 9/13 VASC CATH PLACED, CRRT STARTED, FIO2 AT 100% 9/14 SEVERE HYPOXIA FIO2 AT 70%. CRRT discontinued 9/15: FiO2 requirements down to 40%.  infected PD catheter removed in OR with General Surgery.  Transfuse 1 unit pRBCs for Hgb 7.0.  Cefepime deescalated to Ceftriaxone  Interim History / Subjective:  -No significant events noted overnight -Afebrile, hemodynamically stable, not requiring vasopressors -tolerated HD yesterday followed PD catheter removal  Objective   Blood pressure 124/65, pulse 72, temperature 97.9 F (36.6 C), temperature source Bladder, resp. rate 18, height '5\' 7"'$  (1.702 m), weight 94.4 kg, SpO2 93 %.    Vent Mode: PRVC FiO2 (%):  [28 %-40 %] 28 % Set Rate:  [18 bmp] 18 bmp Vt Set:  [450 mL] 450 mL PEEP:  [5 cmH20] 5 cmH20 Plateau Pressure:  [21 cmH20] 21 cmH20   Intake/Output Summary (Last 24 hours) at 11/23/2021 0849 Last data filed at 11/23/2021 0817 Gross per 24 hour  Intake 1549.96 ml  Output 3632 ml  Net -2082.04 ml    Filed Weights   11/22/21 0319 11/22/21 1300 11/22/21 1705  Weight: 92.2 kg 97.4 kg 94.4 kg    Examination:  Physical Exam Constitutional:      General: She is not in acute distress.    Appearance: She is ill-appearing.  HENT:     Head: Normocephalic.     Nose: Nose normal.     Mouth/Throat:     Mouth: Mucous membranes are moist.     Comments: ETT in place Eyes:     Pupils: Pupils are equal, round, and reactive to light.  Cardiovascular:     Rate and Rhythm: Normal rate and regular rhythm.     Heart sounds: Normal heart sounds.  Pulmonary:     Comments: Ventilated breath sounds Abdominal:     Palpations: Abdomen is soft.  Neurological:     Mental Status: She is alert. She is disoriented.     Comments: Sedated, follows commands       Resolved Hospital Problem list     Assessment & Plan:     41 year old female with history of ESRD on peritoneal dialysis presenting for AMS, subsequently developed cardiac arrest in the ED, found to have multiple electrolyte disturbances in the setting of PD catheter infection and sepsis.   Neuro #Toxic Metabolic Encephalopathy   AMS in the setting of multiple electrolyte abnormalities on presentation in the setting of peritoneal catheter malfunction and infection. Intubated for airway protection, admit to ICU for further care. Sedation with goal RASS -1   -CT head -ve -propofol and fentanyl for sedation, RASS -1   Pulmonary #Pleural Effusions #Acute Hypoxic Respiratory Failure  Intubated for airway protection, with AMS secondary to electrolyte disturbances and severe sepsis. Daily awakening trials and SBT, failed today due to hypoxia. On evaluation. POCUS of the pleural space is notable for bilateral simple appearing and free flowing pleural effusions, R>L. Suspect this is secondary to third spacing and volume overload due to her ESRD. Will continue with HD and consider thoracentesis to expedite extubation.  -SBT daily -consider thoracentesis   Cardiac #Cardiac Arrest #Severe Sepsis   Septic in the setting of peritoneal catheter infection resulting in worsening electrolyte dysfunction in the setting of ESRD. Suffered a cardiac arrest in the ED, resuscitated. TTE pending.   GI   Tube feeds, PPI for prophylaxis   Renal #ESRD   Peritoneal dialysis at home, now with infected catheter, s/p removal by surgery. Was on CRRT for multiple electrolyte disturbances, now transitioned to HD, first session yesterday was tolerated, another session scheduled for today.  -Monitor electrolytes closely. -HD session today -renal following   Endo   ICU hypo/Hyperglycemia protocol   Hem/Onc  Heparin for DVT prophylaxis   ID #Severe Sepsis #Peritoneal Catheter Infection    Infected catheter, s/p removal by surgery today. Continue broad spectrum antibiotics pending culture. Switch Cefepime to CTX and continue vancomycin.  -PD catheter removed 9/15 -Continue CTX & Vancomycin -f/u PD catheter tip culture  Best Practice (right click and "Reselect all SmartList Selections" daily)   Diet/type: tubefeeds DVT prophylaxis: SCD GI prophylaxis: PPI Lines: Dialysis Catheter and yes and it is still needed Foley:  Yes, and it is still needed Code Status:  full code Last date of multidisciplinary goals of care discussion [11/22/21]  Labs   CBC: Recent Labs  Lab 11/18/21 1949 11/20/21 1735 11/21/21 0228 11/21/21 1149 11/21/21 2347 11/22/21 0530 11/23/21 0413  WBC 10.5 15.1* 12.3*  --   --  8.8 8.7  NEUTROABS  --  10.3* 10.4*  --   --   --   --   HGB 8.8* 9.3* 7.1* 7.4* 7.0* 7.0* 8.9*  HCT 27.7* 30.7* 22.6* 23.3* 22.9* 22.8* 28.1*  MCV 89.1 93.6 88.6  --   --  91.9  90.1  PLT 258 357 239  --   --  184 155     Basic Metabolic Panel: Recent Labs  Lab 11/20/21 2155 11/21/21 0228 11/21/21 0413 11/21/21 1547 11/22/21 0530 11/22/21 1857 11/23/21 0413  NA 130*  --  133* 136 133* 134* 131*  K 5.7*  --  4.3 3.5 4.1 3.6 3.7  CL 93*  --  99 100 98 97* 96*  CO2 21*  --  '22 22 22 26 24  '$ GLUCOSE 151*  --  79 78 107* 73 101*  BUN 73*  --  64* 47* 51* 28* 30*  CREATININE 16.14*  --  12.72* 9.45* 9.88* 5.87* 6.20*  CALCIUM 7.8*  --  8.0* 8.1* 7.6* 7.4* 7.0*  MG 2.1 2.0  --   --  1.8  --  1.7  PHOS  --   --  7.3* 5.3* 6.1* 4.0 4.1    GFR: Estimated Creatinine Clearance: 13.8 mL/min (A) (by C-G formula based on SCr of 6.2 mg/dL (H)). Recent Labs  Lab 11/20/21 1735 11/20/21 2155 11/21/21 0228 11/21/21 1045 11/22/21 0530 11/23/21 0413  PROCALCITON  --   --   --  1.49 4.54 4.32  WBC 15.1*  --  12.3*  --  8.8 8.7  LATICACIDVEN 7.8* 1.0  --   --   --   --      Liver Function Tests: Recent Labs  Lab 11/20/21 1735 11/21/21 0413 11/21/21 1547  11/22/21 0530 11/22/21 1857 11/23/21 0413  AST 43*  --   --   --   --   --   ALT 23  --   --   --   --   --   ALKPHOS 144*  --   --   --   --   --   BILITOT 0.3  --   --   --   --   --   PROT 6.3*  --   --   --   --   --   ALBUMIN 2.5* 2.0* 2.0* 1.8* 1.8* 1.8*    No results for input(s): "LIPASE", "AMYLASE" in the last 168 hours. No results for input(s): "AMMONIA" in the last 168 hours.  ABG    Component Value Date/Time   PHART 7.29 (L) 11/20/2021 1810   PCO2ART 37 11/20/2021 1810   PO2ART 170 (H) 11/20/2021 1810   HCO3 17.8 (L) 11/20/2021 1810   ACIDBASEDEF 8.1 (H) 11/20/2021 1810   O2SAT 99.2 11/20/2021 1810     Coagulation Profile: Recent Labs  Lab 11/20/21 1735  INR 1.4*     Cardiac Enzymes: No results for input(s): "CKTOTAL", "CKMB", "CKMBINDEX", "TROPONINI" in the last 168 hours.  HbA1C: Hemoglobin A1C  Date/Time Value Ref Range Status  04/27/2021 12:00 AM 5  Final    Comment:    care everywhere   Hgb A1c MFr Bld  Date/Time Value Ref Range Status  10/01/2021 02:55 AM 4.8 4.8 - 5.6 % Final    Comment:    (NOTE) Pre diabetes:          5.7%-6.4%  Diabetes:              >6.4%  Glycemic control for   <7.0% adults with diabetes     CBG: Recent Labs  Lab 11/23/21 0305 11/23/21 0414 11/23/21 0507 11/23/21 0718 11/23/21 0816  GLUCAP 112* 102* 101* 80 97     Review of Systems:   Unable to assess due intubation/sedation/AMS   Past Medical History:  She,  has a past medical history of Abdominal mass, Arthritis, Chronic kidney disease, Colon polyp, Diabetes 1.5, managed as type 1 ([place]), Drug addiction ([place]), GERD (gastroesophageal reflux disease), Hypertension, Pelvic floor dysfunction, and Urine incontinence.   Surgical History:   Past Surgical History:  Procedure Laterality Date   ABDOMINAL HYSTERECTOMY  2005   CESAREAN SECTION  2003   COLONOSCOPY WITH PROPOFOL N/A 09/14/2015   Procedure: COLONOSCOPY WITH PROPOFOL;  Surgeon: Lucilla Lame,  MD;  Location: [place];  Service: Endoscopy;  Laterality: N/A;  Diabetic - insulin   CYSTOSCOPY W/ RETROGRADES Right 07/06/2017   Procedure: CYSTOSCOPY WITH RETROGRADE PYELOGRAM;  Surgeon: Hollice Espy, MD;  Location: [facility];  Service: Urology;  Laterality: Right;   CYSTOSCOPY W/ URETERAL STENT PLACEMENT Right 06/06/2017   Procedure: CYSTOSCOPY WITH RETROGRADE PYELOGRAM/URETERAL STENT PLACEMENT;  Surgeon: Lucas Mallow, MD;  Location: [facility];  Service: Urology;  Laterality: Right;   CYSTOSCOPY W/ URETERAL STENT PLACEMENT Right 07/06/2017   Procedure: CYSTOSCOPY WITH STENT REPLACEMENT;  Surgeon: Hollice Espy, MD;  Location: [facility];  Service: Urology;  Laterality: Right;   ESOPHAGOGASTRODUODENOSCOPY (EGD) WITH PROPOFOL N/A 06/19/2016   Procedure: ESOPHAGOGASTRODUODENOSCOPY (EGD) WITH PROPOFOL;  Surgeon: Jonathon Bellows, MD;  Location: [facility];  Service: Endoscopy;  Laterality: N/A;   LAPAROSCOPY     LYSIS OF ADHESION     POLYPECTOMY  09/14/2015   Procedure: POLYPECTOMY;  Surgeon: Lucilla Lame, MD;  Location: [place];  Service: Endoscopy;;   RIGHT OOPHORECTOMY  2012   TEMPORARY DIALYSIS CATHETER N/A 10/01/2021   Procedure: TEMPORARY DIALYSIS CATHETER;  Surgeon: Algernon Huxley, MD;  Location: [facility];  Service: Cardiovascular;  Laterality: N/A;   TONSILLECTOMY  1987   and adenoids   TUBAL LIGATION  2004   VULVECTOMY  2011   lipoma     Social History:   reports that she has been smoking cigarettes. She has a 12.00 pack-year smoking history. She has never used smokeless tobacco. She reports that she does not drink alcohol and does not use drugs.   Family History:  Her family history includes Dementia in her maternal grandmother; Depression in her maternal grandmother and mother; Diabetes in her father, maternal grandmother, and paternal grandmother; Heart disease in her maternal grandmother.   Allergies Allergies  Allergen Reactions   Cephalexin  Other (See Comments) and Rash    Unknown   Penicillins Rash    Has patient had a PCN reaction causing immediate rash, facial/tongue/throat swelling, SOB or lightheadedness with hypotension: No Has patient had a PCN reaction causing severe rash involving mucus membranes or skin necrosis: No Has patient had a PCN reaction that required hospitalization: No Has patient had a PCN reaction occurring within the last 10 years: No If all of the above answers are "NO", then may proceed with Cephalosporin use.     Home Medications  Prior to Admission medications   Medication Sig Start Date End Date Taking? Authorizing Provider  aspirin EC 81 MG tablet Take 81 mg by mouth daily.   Yes [provider]  metolazone (ZAROXOLYN) 2.5 MG tablet Take 1 tablet (2.5 mg total) by mouth every other day. 10/29/21  Yes Jearld Fenton, NP  omeprazole (PRILOSEC) 20 MG capsule Take 1 capsule (20 mg total) by mouth daily. 08/07/21  Yes Jearld Fenton, NP  torsemide (DEMADEX) 100 MG tablet Take 1 tablet (100 mg total) by mouth daily. 11/19/21  Yes Hinda Kehr, MD  Accu-Chek Softclix Lancets  lancets Use to check blood sugar daily for type 2 diabetes. E11.9 06/27/21   Jearld Fenton, NP  amLODipine (NORVASC) 10 MG tablet Take 1 tablet (10 mg total) by mouth daily. Patient not taking: Reported on 11/18/2021 10/04/21   Fritzi Mandes, MD  gentamicin cream (GARAMYCIN) 0.1 % Apply 1 Application topically daily. Patient not taking: Reported on 11/18/2021 10/04/21   Fritzi Mandes, MD  glucose blood (ACCU-CHEK GUIDE) test strip 1 each by Other route See admin instructions. 07/08/21   Jearld Fenton, NP  labetalol (NORMODYNE) 100 MG tablet Take 1 tablet (100 mg total) by mouth 3 (three) times daily. Patient not taking: Reported on 11/18/2021 10/03/21   Fritzi Mandes, MD  levothyroxine (SYNTHROID) 25 MCG tablet Take 1 tablet (25 mcg total) by mouth daily. Patient not taking: Reported on 11/18/2021 08/08/21   Jearld Fenton, NP   methadone (DOLOPHINE) 10 MG/ML solution Take 120 mg by mouth daily. Changed by Dr Posey Pronto to get the right home dose w/o sending rx Patient not taking: Reported on 11/20/2021    [provider]  naloxone [facility]) nasal spray 4 mg/0.1 mL One spray in either nostril once for known/suspected opioid overdose. May repeat every 2-3 minutes in alternating nostril til EMS arrives 04/29/21   [provider]     Critical care time: 30 minutes    [name], MD [facility] 11/23/2021 8:59 AM

## 2021-11-23 NOTE — Progress Notes (Signed)
During [facility] pt was able to follow commands but did reach for the tube at times. Failed SBT, not breathing adequately. Re-sedated to RASS goal. Plan for HD today. Will continue to monitor.

## 2021-11-23 NOTE — IPAL (Signed)
  Interdisciplinary Goals of Care Family Meeting   Date carried out: 11/23/2021  Location of the meeting: Bedside  Member's involved: Physician, Bedside Registered Nurse, and Family Member or next of kin  Durable Power of Attorney or acting medical decision maker: Mother Holley Raring and daughter.  Discussion: We discussed goals of care for [facility] . Explained her current condition in regards to her renal failure, respiratory failure, sepsis and infection. Explained she has a pleural effusion that is not currently amenable to sampling. Discussed next steps of therapy including further hemodialysis with ultrafiltration followed by re-attempt at SBT. Mother and daughter had questions re prognosis, mother did not want to discuss long term treatment goals or to address code status.  Code status: Full Code  Disposition: Continue current acute care  Time spent for the meeting: 54 min   [name], MD  11/23/2021, 4:01 PM

## 2021-11-23 NOTE — Progress Notes (Signed)
Patient noted to be very much so awake attempting to setup in bed while mouthing words and using hand gestures to communicate. Discussion made a bedside to place patient in spontaneous mode via vent for possible extubation(RT/NP). Patient tolerating setting changes well. Vitals noted to be stable. Will continue to monitor.

## 2021-11-23 NOTE — Progress Notes (Signed)
Pt 3 hour HD complete. Pt tolerated tx well. Report to ICU RN. Vss.  Start: 0842 End: 6950 3039m fluid removed 54L BVP UTA bed weight due to pt positioning and angle Vancomycin 2050mIV and Epogen 4000u given w/ HD

## 2021-11-23 NOTE — Progress Notes (Signed)
Pt HD in ICU. Sedated and on ventilator. Blood pump error messages on machine. Catheter flushed, lines reversed, BFR 300. Safety maintained. RN at bedside.

## 2021-11-23 NOTE — Progress Notes (Signed)
Post HD RN assessment 

## 2021-11-23 NOTE — Progress Notes (Signed)
Pre HD RN assessment 

## 2021-11-23 NOTE — Consult Note (Signed)
Pharmacy Antibiotic Note  Nichole Wiley is a 41 y.o. female with PMH including ESRD on PD admitted on 11/20/2021 with  acute respiratory failure complicated by cardiac arrest in the ED with downtime of about 8 minutes . She also has infected PD catheter infection. General surgery consulted and removed the infected PD catheter 9/15.  Pharmacy has been consulted for vancomycin dosing. Patient is also ordered ceftriaxone.  Plan: Vancomycin 1 g IV given 9/12 at 0130 Vancomycin 1.5 g IV given 9/13 at 2016 9/14 0413: Vancomycin level: 33 9/15 0530 Vancomycin level: 28 (first HD) 9/16 0413 Vancomycin level: 19  Anticipate dialysis session today, 9/16 per nephrology note from 9/15. Will order vancomycin 1 g IV to be given with last hour of dialysis or post-dialysis today  Height: '5\' 7"'$  (170.2 cm) Weight: 94.4 kg (208 lb 1.8 oz) IBW/kg (Calculated) : 61.6  Temp (24hrs), Avg:97.7 F (36.5 C), Min:96.8 F (36 C), Max:100 F (37.8 C)  Recent Labs  Lab 11/18/21 1949 11/20/21 1735 11/20/21 2155 11/20/21 2155 11/21/21 0228 11/21/21 0413 11/21/21 1547 11/22/21 0530 11/22/21 1857 11/23/21 0413  WBC 10.5 15.1*  --   --  12.3*  --   --  8.8  --  8.7  CREATININE 16.10* 15.66* 16.14*  --   --  12.72* 9.45* 9.88* 5.87* 6.20*  LATICACIDVEN  --  7.8* 1.0  --   --   --   --   --   --   --   VANCORANDOM  --   --   --    < >  --  33  --  28  --  19   < > = values in this interval not displayed.     Estimated Creatinine Clearance: 13.8 mL/min (A) (by C-G formula based on SCr of 6.2 mg/dL (H)).    Allergies  Allergen Reactions   Cephalexin Other (See Comments) and Rash    Unknown   Penicillins Rash    Has patient had a PCN reaction causing immediate rash, facial/tongue/throat swelling, SOB or lightheadedness with hypotension: No Has patient had a PCN reaction causing severe rash involving mucus membranes or skin necrosis: No Has patient had a PCN reaction that required hospitalization: No Has  patient had a PCN reaction occurring within the last 10 years: No If all of the above answers are "NO", then may proceed with Cephalosporin use.    Antimicrobials this admission: Vancomycin 9/12 >>  Cefepime 9/13 >> 9/14 Ceftriaxone 9/15 >>  Dose adjustments this admission: N/A  Microbiology results: 9/13 BCx: NGTD 9/13 UCx: NG  9/13 MRSA PCR: (+) 9/15 Surgical cultures: pending 9/15 Cath tip culture: pending  Thank you for allowing pharmacy to be a part of this patient's care.  Benita Gutter 11/23/2021 8:01 AM

## 2021-11-23 NOTE — Progress Notes (Signed)
[place], [place] 11/23/21  Subjective:   Hospital day # 3  Patient known to our practice from outpatient dialysis.  Last few weeks she had purulent exit site infection with MRSA.  She was treated with intraperitoneal vancomycin and advised to get the catheter removed from her surgeon, which patient missed scheduled appt. .  Patient seen and evaluated at bedside in ICU Remains intubated, 28% FiO2 Sedated on fentanyl and propofol Levo held since yesterday evening Tube feeds at 20 mL/h, D10 infusion Lower extremity edema remains  Objective:  Vital signs in last 24 hours:  Temp:  [96.8 F (36 C)-100 F (37.8 C)] 98.1 F (36.7 C) (09/16 0940) Pulse Rate:  [60-83] 72 (09/16 1010) Resp:  [15-22] 18 (09/16 1010) BP: (87-148)/(53-75) 102/65 (09/16 1055) SpO2:  [93 %-97 %] 93 % (09/16 1010) FiO2 (%):  [28 %-40 %] 28 % (09/16 0800) Weight:  [94.4 kg-97.4 kg] 94.4 kg (09/15 1705)  Weight change: 5.2 kg Filed Weights   11/22/21 0319 11/22/21 1300 11/22/21 1705  Weight: 92.2 kg 97.4 kg 94.4 kg    Intake/Output:    Intake/Output Summary (Last 24 hours) at 11/23/2021 1105 Last data filed at 11/23/2021 1041 Gross per 24 hour  Intake 2423.62 ml  Output 3630 ml  Net -1206.38 ml      Physical Exam: General: Critically ill-appearing  HEENT ET tube in place  Pulm/lungs Vent related assisted, FiO2 28%/PEEP 5  CVS/Heart Regular rhythm  Abdomen:  Nondistended  Extremities: 2+ pitting edema  Neurologic: Sedated  Skin: No acute rashes  Access: Right IJ temporary catheter       Basic Metabolic Panel:  Recent Labs  Lab 11/20/21 2155 11/21/21 0228 11/21/21 0413 11/21/21 1547 11/22/21 0530 11/22/21 1857 11/23/21 0413  NA 130*  --  133* 136 133* 134* 131*  K 5.7*  --  4.3 3.5 4.1 3.6 3.7  CL 93*  --  99 100 98 97* 96*  CO2 21*  --  '22 22 22 26 24  '$ GLUCOSE 151*  --  79 78 107* 73 101*  BUN 73*  --  64* 47* 51* 28* 30*  CREATININE 16.14*  --   12.72* 9.45* 9.88* 5.87* 6.20*  CALCIUM 7.8*  --  8.0* 8.1* 7.6* 7.4* 7.0*  MG 2.1 2.0  --   --  1.8  --  1.7  PHOS  --   --  7.3* 5.3* 6.1* 4.0 4.1      CBC: Recent Labs  Lab 11/18/21 1949 11/20/21 1735 11/21/21 0228 11/21/21 1149 11/21/21 2347 11/22/21 0530 11/23/21 0413  WBC 10.5 15.1* 12.3*  --   --  8.8 8.7  NEUTROABS  --  10.3* 10.4*  --   --   --   --   HGB 8.8* 9.3* 7.1* 7.4* 7.0* 7.0* 8.9*  HCT 27.7* 30.7* 22.6* 23.3* 22.9* 22.8* 28.1*  MCV 89.1 93.6 88.6  --   --  91.9 90.1  PLT 258 357 239  --   --  184 155       Lab Results  Component Value Date   HEPBSAG NON REACTIVE 11/21/2021   HEPBIGM NON REACTIVE 10/01/2021      Microbiology:  Recent Results (from the past 240 hour(s))  Blood Culture (routine x 2)     Status: None (Preliminary result)   Collection Time: 11/20/21  5:54 PM   Specimen: BLOOD  Result Value Ref Range Status   Specimen Description BLOOD LEFT ANTECUBITAL  Final  Special Requests   Final    BOTTLES DRAWN AEROBIC AND ANAEROBIC Blood Culture adequate volume   Culture   Final    NO GROWTH 3 DAYS Performed at [facility], [address] [place], [place] [address]    Report Status PENDING  Incomplete  SARS Coronavirus 2 by RT PCR (hospital order, performed in [facility] hospital lab) *cepheid single result test* Anterior Nasal Swab     Status: None   Collection Time: 11/20/21  6:14 PM   Specimen: Anterior Nasal Swab  Result Value Ref Range Status   SARS Coronavirus 2 by RT PCR NEGATIVE NEGATIVE Final    Comment: (NOTE) SARS-CoV-2 target nucleic acids are NOT DETECTED.  The SARS-CoV-2 RNA is generally detectable in upper and lower respiratory specimens during the acute phase of infection. The lowest concentration of SARS-CoV-2 viral copies this assay can detect is 250 copies / mL. A negative result does not preclude SARS-CoV-2 infection and should not be used as the sole basis for treatment or other patient management  decisions.  A negative result may occur with improper specimen collection / handling, submission of specimen other than nasopharyngeal swab, presence of viral mutation(s) within the areas targeted by this assay, and inadequate number of viral copies (<250 copies / mL). A negative result must be combined with clinical observations, patient history, and epidemiological information.  Fact Sheet for Patients:   https://www.patel.info/  Fact Sheet for Healthcare Providers: https://hall.com/  This test is not yet approved or  cleared by the [place] FDA and has been authorized for detection and/or diagnosis of SARS-CoV-2 by FDA under an Emergency Use Authorization (EUA).  This EUA will remain in effect (meaning this test can be used) for the duration of the COVID-19 declaration under Section 564(b)(1) of the Act, 21 U.S.C. section 360bbb-3(b)(1), unless the authorization is terminated or revoked sooner.  Performed at [facility], [address] [place], [place] [address]   Urine Culture     Status: None   Collection Time: 11/20/21  6:30 PM   Specimen: Urine, Random  Result Value Ref Range Status   Specimen Description   Final    URINE, RANDOM Performed at [facility], [address] [place], [place] [address]    Special Requests   Final    NONE Performed at [facility], [address], [place], [place] [address]    Culture   Final    NO GROWTH Performed at [facility], [place] [address] [place], [place] [address]    Report Status 11/22/2021 FINAL  Final  MRSA Next Gen by PCR, Nasal     Status: Abnormal   Collection Time: 11/20/21  7:50 PM   Specimen: Nasal Mucosa; Nasal Swab  Result Value Ref Range Status   MRSA by PCR Next Gen DETECTED (A) NOT DETECTED Final    Comment: RESULT CALLED TO, READ BACK BY AND VERIFIED WITH: BETH BUONO AT 2210 ON 11/20/21 BY SS (NOTE) The GeneXpert MRSA Assay  (FDA approved for NASAL specimens only), is one component of a comprehensive MRSA colonization surveillance program. It is not intended to diagnose MRSA infection nor to guide or monitor treatment for MRSA infections. Test performance is not FDA approved in patients less than 105 years old. Performed at [facility], [address] [place], [place] [address]   Blood Culture (routine x 2)     Status: None (Preliminary result)   Collection Time: 11/20/21  9:55 PM  Specimen: BLOOD  Result Value Ref Range Status   Specimen Description BLOOD CENTRAL LINE  Final   Special Requests   Final    BOTTLES DRAWN AEROBIC AND ANAEROBIC Blood Culture results may not be optimal due to an inadequate volume of blood received in culture bottles   Culture   Final    NO GROWTH 3 DAYS Performed at [facility], [address] [place], [place] [address]    Report Status PENDING  Incomplete    Coagulation Studies: Recent Labs    11/20/21 1735  LABPROT 16.6*  INR 1.4*     Urinalysis: Recent Labs    11/20/21 1830  COLORURINE STRAW*  LABSPEC 1.009  PHURINE 8.0  GLUCOSEU 50*  HGBUR NEGATIVE  BILIRUBINUR NEGATIVE  KETONESUR NEGATIVE  PROTEINUR 100*  NITRITE NEGATIVE  LEUKOCYTESUR NEGATIVE       Imaging: DG Chest Port 1 View  Result Date: 11/23/2021 CLINICAL DATA:  Acute respiratory failure. EXAM: PORTABLE CHEST 1 VIEW COMPARISON:  Chest x-ray dated November 20, 2021. FINDINGS: Unchanged endotracheal and enteric tubes. Unchanged right internal jugular dialysis catheter. Stable cardiomediastinal silhouette. Hazy density at both lung bases likely reflecting layering pleural effusions and atelectasis, similar to slightly improved. No pneumothorax. No acute osseous abnormality. IMPRESSION: 1. Stable to slightly improved layering bilateral pleural effusions and bibasilar atelectasis. Electronically Signed   By: Titus Dubin M.D.   On: 11/23/2021 09:01     Medications:     sodium chloride     sodium chloride Stopped (11/20/21 2201)   cefTRIAXone (ROCEPHIN)  IV Stopped (11/22/21 1935)   dextrose 70 mL/hr at 11/23/21 0818   feeding supplement (VITAL HIGH PROTEIN) 1,000 mL (11/23/21 0955)   fentaNYL infusion INTRAVENOUS 200 mcg/hr (11/23/21 0817)   magnesium sulfate bolus IVPB     norepinephrine (LEVOPHED) Adult infusion Stopped (11/22/21 1738)   propofol (DIPRIVAN) infusion 60 mcg/kg/min (11/23/21 0954)   vancomycin 1,000 mg (11/23/21 1041)    Chlorhexidine Gluconate Cloth  6 each Topical Q0600   docusate  100 mg Per Tube BID   epoetin (EPOGEN/PROCRIT) injection  4,000 Units Intravenous Q T,Th,Sa-HD   free water  20 mL Per Tube Q6H   heparin injection (subcutaneous)  5,000 Units Subcutaneous Q8H   multivitamin  1 tablet Per Tube QHS   multivitamin  15 mL Per Tube Daily   mupirocin ointment  1 Application Nasal BID   mouth rinse  15 mL Mouth Rinse Q2H   pantoprazole  40 mg Per Tube Daily   polyethylene glycol  17 g Per Tube Daily   sodium chloride flush  3 mL Intravenous Q12H   vancomycin variable dose per unstable renal function (pharmacist dosing)   Does not apply See admin instructions   sodium chloride, acetaminophen, docusate sodium, fentaNYL, ondansetron (ZOFRAN) IV, mouth rinse, polyethylene glycol, sodium chloride flush  Assessment/ Plan:  41 y.o. female with end-stage renal disease on peritoneal dialysis chronic pain syndrome, history of diabetes, now diet controlled, hypertension, hyperlipidemia    admitted on 11/20/2021 for Cardiac arrest [facility]) [I46.9]  #End-stage renal disease with generalized edema Peritoneal dialysis with severe MRSA exit site infection.  Currently being treated with vancomycin IV.    Appreciate surgery removing PD catheter yesterday. Will continue IV vancomycin and ceftriaxone. Will continue hemodialysis, patient receiving treatment today, UF goal 3L. Will attempt to manage fluid volume with dialysis. Will request  permcath placement when patient stable. Next treatment scheduled for Tuesday, unless needed for fluid removal.   #MRSA exit  site infection (11/07/21) PD catheter removed on 11/22/21.   #Anemia of chronic kidney disease Lab Results  Component Value Date   HGB 8.9 (L) 11/23/2021    Low dose Epogen with hemodialysis. Received blood transfusion this admission.  Secondary hyperparathyroidism of renal origin Lab Results  Component Value Date   CALCIUM 7.0 (L) 11/23/2021   PHOS 4.1 11/23/2021   Patient with hypocalcemia and hyperphosphatemia. We will monitor closely.  Will consider binders when taking orals.    LOS: [place] 9/16/202311:05 [place], [place]

## 2021-11-24 ENCOUNTER — Inpatient Hospital Stay: Payer: Medicaid Other

## 2021-11-24 DIAGNOSIS — J90 Pleural effusion, not elsewhere classified: Secondary | ICD-10-CM

## 2021-11-24 LAB — TYPE AND SCREEN
ABO/RH(D): A POS
Antibody Screen: NEGATIVE
Unit division: 0
Unit division: 0
Unit division: 0

## 2021-11-24 LAB — CATH TIP CULTURE

## 2021-11-24 LAB — BPAM RBC
Blood Product Expiration Date: 202310132359
Blood Product Expiration Date: 202310132359
Blood Product Expiration Date: 202310142359
ISSUE DATE / TIME: 202309151315
Unit Type and Rh: 6200
Unit Type and Rh: 6200
Unit Type and Rh: 6200

## 2021-11-24 LAB — RENAL FUNCTION PANEL
Albumin: 2.1 g/dL — ABNORMAL LOW (ref 3.5–5.0)
Albumin: 2 g/dL — ABNORMAL LOW (ref 3.5–5.0)
Anion gap: 11 (ref 5–15)
Anion gap: 14 (ref 5–15)
BUN: 24 mg/dL — ABNORMAL HIGH (ref 6–20)
BUN: 28 mg/dL — ABNORMAL HIGH (ref 6–20)
CO2: 27 mmol/L (ref 22–32)
CO2: 22 mmol/L (ref 22–32)
Calcium: 7.3 mg/dL — ABNORMAL LOW (ref 8.9–10.3)
Calcium: 7.3 mg/dL — ABNORMAL LOW (ref 8.9–10.3)
Chloride: 95 mmol/L — ABNORMAL LOW (ref 98–111)
Chloride: 97 mmol/L — ABNORMAL LOW (ref 98–111)
Creatinine, Ser: 5.15 mg/dL — ABNORMAL HIGH (ref 0.44–1.00)
Creatinine, Ser: 5.43 mg/dL — ABNORMAL HIGH (ref 0.44–1.00)
GFR, Estimated: 10 mL/min — ABNORMAL LOW (ref >60)
GFR, Estimated: 9 mL/min — ABNORMAL LOW (ref >60)
Glucose, Bld: 143 mg/dL — ABNORMAL HIGH (ref 70–99)
Glucose, Bld: 156 mg/dL — ABNORMAL HIGH (ref 70–99)
Phosphorus: 4.2 mg/dL (ref 2.5–4.6)
Phosphorus: 5.5 mg/dL — ABNORMAL HIGH (ref 2.5–4.6)
Potassium: 3.6 mmol/L (ref 3.5–5.1)
Potassium: 3.8 mmol/L (ref 3.5–5.1)
Sodium: 133 mmol/L — ABNORMAL LOW (ref 135–145)
Sodium: 133 mmol/L — ABNORMAL LOW (ref 135–145)

## 2021-11-24 LAB — CBC
HCT: 29.2 % — ABNORMAL LOW (ref 36.0–46.0)
Hemoglobin: 9.2 g/dL — ABNORMAL LOW (ref 12.0–15.0)
MCH: 28.5 pg (ref 26.0–34.0)
MCHC: 31.5 g/dL (ref 30.0–36.0)
MCV: 90.4 fL (ref 80.0–100.0)
Platelets: 160 10*3/uL (ref 150–400)
RBC: 3.23 MIL/uL — ABNORMAL LOW (ref 3.87–5.11)
RDW: 14.6 % (ref 11.5–15.5)
WBC: 13.9 10*3/uL — ABNORMAL HIGH (ref 4.0–10.5)
nRBC: 0 % (ref 0.0–0.2)

## 2021-11-24 LAB — GLUCOSE, PLEURAL OR PERITONEAL FLUID: Glucose, Fluid: 128 mg/dL

## 2021-11-24 LAB — PROTEIN, PLEURAL OR PERITONEAL FLUID: Total protein, fluid: <3 g/dL

## 2021-11-24 LAB — BODY FLUID CELL COUNT WITH DIFFERENTIAL
Lymphs, Fluid: 5 %
Monocyte-Macrophage-Serous Fluid: 64 %
Neutrophil Count, Fluid: 31 %
Total Nucleated Cell Count, Fluid: 288 cu mm

## 2021-11-24 LAB — LACTATE DEHYDROGENASE, PLEURAL OR PERITONEAL FLUID: LD, Fluid: 66 U/L — ABNORMAL HIGH (ref 3–23)

## 2021-11-24 LAB — AMYLASE, PLEURAL OR PERITONEAL FLUID: Amylase, Fluid: <5 U/L

## 2021-11-24 LAB — PREPARE RBC (CROSSMATCH)

## 2021-11-24 LAB — MAGNESIUM: Magnesium: 2.1 mg/dL (ref 1.7–2.4)

## 2021-11-24 LAB — GLUCOSE, CAPILLARY
Glucose-Capillary: 152 mg/dL — ABNORMAL HIGH (ref 70–99)
Glucose-Capillary: 109 mg/dL — ABNORMAL HIGH (ref 70–99)
Glucose-Capillary: 134 mg/dL — ABNORMAL HIGH (ref 70–99)
Glucose-Capillary: 129 mg/dL — ABNORMAL HIGH (ref 70–99)

## 2021-11-24 MED ORDER — PANTOPRAZOLE SODIUM 40 MG IV SOLR
40.0000 mg | INTRAVENOUS | Status: DC
  Administered 2021-11-24 – 2021-11-25 (×2): 40 mg via INTRAVENOUS
  Filled 2021-11-24 (×2): qty 10

## 2021-11-24 MED ORDER — LORAZEPAM 2 MG/ML IJ SOLN
0.5000 mg | Freq: Once | INTRAMUSCULAR | Status: AC
  Administered 2021-11-24: 0.5 mg via INTRAVENOUS
  Filled 2021-11-24: qty 1

## 2021-11-24 MED ORDER — HEPARIN SODIUM (PORCINE) 1000 UNIT/ML IJ SOLN
INTRAMUSCULAR | Status: AC
  Filled 2021-11-24: qty 10

## 2021-11-24 MED ORDER — VANCOMYCIN HCL IN DEXTROSE 1-5 GM/200ML-% IV SOLN: 1000.0000 mg | INTRAVENOUS | Status: DC

## 2021-11-24 NOTE — Consult Note (Signed)
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   Goal of Therapy:  Electrolytes within normal limits Preference K ~ 4 and Mg ~ 2 given cardiac arrest  Plan:  --no replacement needed at this time.  --Follow-up electrolytes with AM labs tomorrow  Oswald Hillock 11/24/2021 8:58 AM

## 2021-11-24 NOTE — Progress Notes (Signed)
Patient awake and writing "want to take out". NP/RN present for extubation. Patient tolerated procedure well. Placed on 6L HFNC

## 2021-11-24 NOTE — Progress Notes (Signed)
Pt oxygen saturation dropped to 82% on 8L HFNC. HFNC increased to 15 L and pt sating 86-88% and still feeling SOB. MD and RT called to bedside.

## 2021-11-24 NOTE — Procedures (Signed)
Thoracentesis Procedure Note  Nichole Wiley  [id number]  [birth year]  Date:11/24/21  Time:11:20 AM   Provider Performing:Ileene Allie   Procedure: Thoracentesis with imaging guidance (67124)  Indication(s) Pleural Effusion  Consent Risks of the procedure as well as the alternatives and risks of each were explained to the patient and/or caregiver.  Consent for the procedure was obtained and is signed in the bedside chart  Anesthesia Topical only with 1% lidocaine    Time Out Verified patient identification, verified procedure, site/side was marked, verified correct patient position, special equipment/implants available, medications/allergies/relevant history reviewed, required imaging and test results available.   Sterile Technique Maximal sterile technique including full sterile barrier drape, hand hygiene, sterile gown, sterile gloves, mask, hair covering, sterile ultrasound probe cover (if used).  Procedure Description Ultrasound was used to identify appropriate pleural anatomy for placement and overlying skin marked.  Area of drainage cleaned with chlorhexidine and draped in sterile fashion. Lidocaine was used to anesthetize the skin and subcutaneous tissue all the way to the pleura. An incision was made using a blade. An [place] 8 french over the needle catheter was inserted over an 18 gauge needle over the postero-lateral right sided 8th rib ease. 900 cc's of straw color appearing fluid was drained from the right pleural space. Procedure terminated after patient reported chest tightness. Catheter then removed and bandaid applied to site.  Complications/Tolerance None; patient tolerated the procedure well. Chest X-ray is ordered to confirm no post-procedural complication.   EBL Minimal   Specimen(s) Pleural fluid, samples sent for analysis, cytology, flow cytometry, and culture.   [name], MD [facility]

## 2021-11-24 NOTE — Progress Notes (Signed)
Patient assessed for extubation per nursing request. Initially appearing Alert and oriented, following commands. Placed in PSV mode with adequate oxygenation and volumes however on reassessment patient appeared more groggy. Precedex drip turned off completely and patient left in PSV.  Upon reassessment, patient alert and following commands. RBSI: 22, with consistent TV in the 600's  Extubated to nasal cannula with RT, RN present without complication.    Domingo Pulse Rust[place], AGACNP-BC Acute Care Nurse Practitioner [facility]   [phone] / [phone] Please see Amion for pager details.

## 2021-11-24 NOTE — Progress Notes (Signed)
Hd tx of 2.5hrs completed. 51.6L total vol removed. No complications. Report given to [employer], rd. Total uf removed: 2051m Post hd wt: 88kg  Post hd v/s: 97.5 142/81(99) 101 16 99%

## 2021-11-24 NOTE — Progress Notes (Signed)
[place], [place] 11/24/21  Subjective:   Hospital day # 4  Patient known to our practice from outpatient dialysis.  Last few weeks she had purulent exit site infection with MRSA.  She was treated with intraperitoneal vancomycin and advised to get the catheter removed from her surgeon, which patient missed scheduled appt. .  Patient seen and evaluated at bedside in ICU Patient was extubated overnight, currently on 2 L high flow nasal cannula.  Productive cough present. No sedation, no pressors Generalized edema remains   Objective:  Vital signs in last 24 hours:  Temp:  [97.2 F (36.2 C)-99.9 F (37.7 C)] 97.3 F (36.3 C) (09/17 1200) Pulse Rate:  [66-102] 97 (09/17 1200) Resp:  [9-24] 13 (09/17 1200) BP: (100-157)/(57-89) 151/83 (09/17 1200) SpO2:  [90 %-100 %] 92 % (09/17 1238) FiO2 (%):  [28 %-40 %] 35 % (09/17 0000)  Weight change:  Filed Weights   11/22/21 0319 11/22/21 1300 11/22/21 1705  Weight: 92.2 kg 97.4 kg 94.4 kg    Intake/Output:    Intake/Output Summary (Last 24 hours) at 11/24/2021 1241 Last data filed at 11/24/2021 1239 Gross per 24 hour  Intake 1718.28 ml  Output 1345 ml  Net 373.28 ml      Physical Exam: General: ill-appearing  HEENT Normocephalic, dry oral membranes  Pulm/lungs Diffuse rhonchi, [place] O2, productive cough  CVS/Heart Regular rhythm  Abdomen:  Nondistended, tender  Extremities: 3+ pitting edema  Neurologic: Alert/oriented, able to answer simple questions  Skin: No acute rashes  Access: Right IJ temporary catheter       Basic Metabolic Panel:  Recent Labs  Lab 11/20/21 2155 11/21/21 0228 11/21/21 0413 11/22/21 0530 11/22/21 1857 11/23/21 0413 11/23/21 1640 11/24/21 0421  NA 130*  --    < > 133* 134* 131* 130* 133*  K 5.7*  --    < > 4.1 3.6 3.7 3.7 3.6  CL 93*  --    < > 98 97* 96* 96* 95*  CO2 21*  --    < > '22 26 24 24 27  '$ GLUCOSE 151*  --    < > 107* 73 101* 130* 143*  BUN 73*  --     < > 51* 28* 30* 19 24*  CREATININE 16.14*  --    < > 9.88* 5.87* 6.20* 4.49* 5.15*  CALCIUM 7.8*  --    < > 7.6* 7.4* 7.0* 7.2* 7.3*  MG 2.1 2.0  --  1.8  --  1.7  --  2.1  PHOS  --   --    < > 6.1* 4.0 4.1 3.6 4.2   < > = values in this interval not displayed.      CBC: Recent Labs  Lab 11/20/21 1735 11/21/21 0228 11/21/21 1149 11/21/21 2347 11/22/21 0530 11/23/21 0413 11/24/21 0421  WBC 15.1* 12.3*  --   --  8.8 8.7 13.9*  NEUTROABS 10.3* 10.4*  --   --   --   --   --   HGB 9.3* 7.1* 7.4* 7.0* 7.0* 8.9* 9.2*  HCT 30.7* 22.6* 23.3* 22.9* 22.8* 28.1* 29.2*  MCV 93.6 88.6  --   --  91.9 90.1 90.4  PLT 357 239  --   --  184 155 160       Lab Results  Component Value Date   HEPBSAG NON REACTIVE 11/21/2021   HEPBIGM NON REACTIVE 10/01/2021      Microbiology:  Recent Results (from the past  240 hour(s))  Blood Culture (routine x 2)     Status: None (Preliminary result)   Collection Time: 11/20/21  5:54 PM   Specimen: BLOOD  Result Value Ref Range Status   Specimen Description BLOOD LEFT ANTECUBITAL  Final   Special Requests   Final    BOTTLES DRAWN AEROBIC AND ANAEROBIC Blood Culture adequate volume   Culture   Final    NO GROWTH 4 DAYS Performed at [facility], [address] [place], [place] [address]    Report Status PENDING  Incomplete  SARS Coronavirus 2 by RT PCR (hospital order, performed in [place] hospital lab) *cepheid single result test* Anterior Nasal Swab     Status: None   Collection Time: 11/20/21  6:14 PM   Specimen: Anterior Nasal Swab  Result Value Ref Range Status   SARS Coronavirus 2 by RT PCR NEGATIVE NEGATIVE Final    Comment: (NOTE) SARS-CoV-2 target nucleic acids are NOT DETECTED.  The SARS-CoV-2 RNA is generally detectable in upper and lower respiratory specimens during the acute phase of infection. The lowest concentration of SARS-CoV-2 viral copies this assay can detect is 250 copies / mL. A negative result does  not preclude SARS-CoV-2 infection and should not be used as the sole basis for treatment or other patient management decisions.  A negative result may occur with improper specimen collection / handling, submission of specimen other than nasopharyngeal swab, presence of viral mutation(s) within the areas targeted by this assay, and inadequate number of viral copies (<250 copies / mL). A negative result must be combined with clinical observations, patient history, and epidemiological information.  Fact Sheet for Patients:   https://www.patel.info/  Fact Sheet for Healthcare Providers: https://hall.com/  This test is not yet approved or  cleared by the [place] FDA and has been authorized for detection and/or diagnosis of SARS-CoV-2 by FDA under an Emergency Use Authorization (EUA).  This EUA will remain in effect (meaning this test can be used) for the duration of the COVID-19 declaration under Section 564(b)(1) of the Act, 21 U.S.C. section 360bbb-3(b)(1), unless the authorization is terminated or revoked sooner.  Performed at [facility], [address], [place], [place] [address]   Urine Culture     Status: None   Collection Time: 11/20/21  6:30 PM   Specimen: Urine, Random  Result Value Ref Range Status   Specimen Description   Final    URINE, RANDOM Performed at [facility], [address], [place], [place] [address]    Special Requests   Final    NONE Performed at [facility], [address], [place], [place] [address]    Culture   Final    NO GROWTH Performed at [facility], [place] [address] [place], [place] [address]    Report Status 11/22/2021 FINAL  Final  MRSA Next Gen by PCR, Nasal     Status: Abnormal   Collection Time: 11/20/21  7:50 PM   Specimen: Nasal Mucosa; Nasal Swab  Result Value Ref Range Status   MRSA by PCR Next Gen DETECTED (A) NOT DETECTED Final    Comment:  RESULT CALLED TO, READ BACK BY AND VERIFIED WITH: BETH BUONO AT 2210 ON 11/20/21 BY SS (NOTE) The GeneXpert MRSA Assay (FDA approved for NASAL specimens only), is one component of a comprehensive MRSA colonization surveillance program. It is not intended to diagnose MRSA infection nor to guide or monitor treatment for MRSA infections. Test performance is not  FDA approved in patients less than 68 years old. Performed at [facility], [address] [place], [place] [address]   Blood Culture (routine x 2)     Status: None (Preliminary result)   Collection Time: 11/20/21  9:55 PM   Specimen: BLOOD  Result Value Ref Range Status   Specimen Description BLOOD CENTRAL LINE  Final   Special Requests   Final    BOTTLES DRAWN AEROBIC AND ANAEROBIC Blood Culture results may not be optimal due to an inadequate volume of blood received in culture bottles   Culture   Final    NO GROWTH 4 DAYS Performed at [facility], [address] [place], [place] [address]    Report Status PENDING  Incomplete  Cath Tip Culture     Status: None (Preliminary result)   Collection Time: 11/22/21  4:45 PM   Specimen: Catheter Tip  Result Value Ref Range Status   Specimen Description CATH TIP  Final   Special Requests   Final    NONE Performed at [facility], [place] [address], [place], [place] [address]    Culture MODERATE STAPHYLOCOCCUS AUREUS  Final   Report Status PENDING  Incomplete    Coagulation Studies: No results for input(s): "LABPROT", "INR" in the last 72 hours.   Urinalysis: No results for input(s): "COLORURINE", "LABSPEC", "PHURINE", "GLUCOSEU", "HGBUR", "BILIRUBINUR", "KETONESUR", "PROTEINUR", "UROBILINOGEN", "NITRITE", "LEUKOCYTESUR" in the last 72 hours.  Invalid input(s): "APPERANCEUR"     Imaging: DG Chest Port 1 View  Result Date: 11/24/2021 CLINICAL DATA:  Chest pain, shortness of breath EXAM: PORTABLE CHEST 1 VIEW COMPARISON:  Previous studies including  the examination of 11/23/2021 FINDINGS: There is interval removal of endotracheal tube and NG tube. Tip of right IJ central venous catheter is seen at the junction of superior vena cava and right atrium. Central pulmonary vessels are prominent. Increased interstitial markings are seen in both lungs. There is small to moderate left pleural effusion. Haziness in the right lower lung field may suggest layering of small effusion. There is no pneumothorax. IMPRESSION: Cardiomegaly. CHF. Pulmonary edema. Bilateral pleural effusions, more so on the left side. Electronically Signed   By: Elmer Picker M.D.   On: 11/24/2021 11:47   ECHOCARDIOGRAM COMPLETE  Result Date: 11/23/2021    ECHOCARDIOGRAM REPORT   Patient Name:   BERNADETTE GORES Date of Exam: 11/23/2021 Medical Rec #:  [id number]       Height:       67.0 in Accession #:    [id number]      Weight:       208.1 lb Date of Birth:  03-09-1981       BSA:          2.057 m Patient Age:    19 years        BP:           123/66 mmHg Patient Gender: F               HR:           61 bpm. Exam Location:  [facility] Procedure: 2D Echo Indications:     Cardiac Arrest  History:         Patient has no prior history of Echocardiogram examinations.                  Arrythmias:LBBB; Risk Factors:Hypertension, Diabetes and CKD.  Sonographer:     L. Thornton-Maynard Referring Phys:  Bradly Bienenstock Diagnosing  Phys: Fransico Him MD IMPRESSIONS  1. Left ventricular ejection fraction, by estimation, is 40 to 45%. The left ventricle has mildly decreased function. The left ventricle demonstrates global hypokinesis. Left ventricular diastolic parameters are indeterminate. Elevated left ventricular end-diastolic pressure.  2. Right ventricular systolic function is normal. The right ventricular size is normal.  3. Large pleural effusion in the left lateral region.  4. The mitral valve is normal in structure. Trivial mitral valve regurgitation. No evidence of mitral stenosis.  5. The aortic  valve is tricuspid. There is moderate calcification of the aortic valve. Aortic valve regurgitation is not visualized. Aortic valve sclerosis/calcification is present, without any evidence of aortic stenosis. Aortic valve area, by VTI measures 2.39 cm. Aortic valve mean gradient measures 3.0 mmHg. Aortic valve Vmax measures 1.23 m/s.  6. The inferior vena cava is dilated in size with <50% respiratory variability, suggesting right atrial pressure of 15 mmHg. FINDINGS  Left Ventricle: Left ventricular ejection fraction, by estimation, is 40 to 45%. The left ventricle has mildly decreased function. The left ventricle demonstrates global hypokinesis. The left ventricular internal cavity size was normal in size. There is  no left ventricular hypertrophy. Left ventricular diastolic parameters are indeterminate. Elevated left ventricular end-diastolic pressure. Right Ventricle: The right ventricular size is normal. No increase in right ventricular wall thickness. Right ventricular systolic function is normal. Left Atrium: Left atrial size was normal in size. Right Atrium: Right atrial size was normal in size. Pericardium: Trivial pericardial effusion is present. The pericardial effusion is circumferential. Mitral Valve: The mitral valve is normal in structure. Trivial mitral valve regurgitation. No evidence of mitral valve stenosis. Tricuspid Valve: The tricuspid valve is normal in structure. Tricuspid valve regurgitation is trivial. No evidence of tricuspid stenosis. Aortic Valve: The aortic valve is tricuspid. There is moderate calcification of the aortic valve. Aortic valve regurgitation is not visualized. Aortic valve sclerosis/calcification is present, without any evidence of aortic stenosis. Aortic valve mean gradient measures 3.0 mmHg. Aortic valve peak gradient measures 6.1 mmHg. Aortic valve area, by VTI measures 2.39 cm. Pulmonic Valve: The pulmonic valve was normal in structure. Pulmonic valve regurgitation is  trivial. No evidence of pulmonic stenosis. Aorta: The aortic root is normal in size and structure. Venous: The inferior vena cava is dilated in size with less than 50% respiratory variability, suggesting right atrial pressure of 15 mmHg. IAS/Shunts: No atrial level shunt detected by color flow Doppler. Additional Comments: There is a large pleural effusion in the left lateral region.  LEFT VENTRICLE PLAX 2D LVIDd:         5.10 cm      Diastology LVIDs:         3.30 cm      LV e' medial:    5.98 cm/s LV PW:         1.30 cm      LV E/e' medial:  17.7 LV IVS:        1.10 cm      LV e' lateral:   7.72 cm/s LVOT diam:     2.00 cm      LV E/e' lateral: 13.7 LV SV:         58 LV SV Index:   28 LVOT Area:     3.14 cm  LV Volumes (MOD) LV vol d, MOD A2C: 119.0 ml LV vol d, MOD A4C: 115.0 ml LV vol s, MOD A2C: 70.2 ml LV vol s, MOD A4C: 61.4 ml LV SV MOD A2C:  48.8 ml LV SV MOD A4C:     115.0 ml LV SV MOD BP:      53.0 ml RIGHT VENTRICLE RV S prime:     10.90 cm/s TAPSE (M-mode): 1.9 cm LEFT ATRIUM             Index        RIGHT ATRIUM           Index LA diam:        3.40 cm 1.65 cm/m   RA Area:     12.10 cm LA Vol (A2C):   34.5 ml 16.77 ml/m  RA Volume:   24.70 ml  12.01 ml/m LA Vol (A4C):   49.8 ml 24.21 ml/m LA Biplane Vol: 42.5 ml 20.66 ml/m  AORTIC VALVE                    PULMONIC VALVE AV Area (Vmax):    2.35 cm     PV Vmax:       0.90 m/s AV Area (Vmean):   2.27 cm     PV Peak grad:  3.2 mmHg AV Area (VTI):     2.39 cm AV Vmax:           123.00 cm/s AV Vmean:          84.800 cm/s AV VTI:            0.243 m AV Peak Grad:      6.1 mmHg AV Mean Grad:      3.0 mmHg LVOT Vmax:         92.20 cm/s LVOT Vmean:        61.300 cm/s LVOT VTI:          0.185 m LVOT/AV VTI ratio: 0.76  AORTA Ao Root diam: 2.90 cm Ao Asc diam:  2.90 cm MITRAL VALVE MV Area (PHT): 4.49 cm     SHUNTS MV Decel Time: 169 msec     Systemic VTI:  0.18 m MV E velocity: 106.00 cm/s  Systemic Diam: 2.00 cm MV A velocity: 82.30 cm/s MV E/A  ratio:  1.29 Fransico Him MD Electronically signed by Fransico Him MD Signature Date/Time: 11/23/2021/10:31:03 PM    Final    DG Chest Port 1 View  Result Date: 11/23/2021 CLINICAL DATA:  Acute respiratory failure. EXAM: PORTABLE CHEST 1 VIEW COMPARISON:  Chest x-ray dated November 20, 2021. FINDINGS: Unchanged endotracheal and enteric tubes. Unchanged right internal jugular dialysis catheter. Stable cardiomediastinal silhouette. Hazy density at both lung bases likely reflecting layering pleural effusions and atelectasis, similar to slightly improved. No pneumothorax. No acute osseous abnormality. IMPRESSION: 1. Stable to slightly improved layering bilateral pleural effusions and bibasilar atelectasis. Electronically Signed   By: Titus Dubin M.D.   On: 11/23/2021 09:01     Medications:    sodium chloride     sodium chloride Stopped (11/20/21 2201)   cefTRIAXone (ROCEPHIN)  IV Stopped (11/23/21 1746)   dextrose Stopped (11/23/21 2135)   [START ON 11/26/2021] vancomycin      Chlorhexidine Gluconate Cloth  6 each Topical Q0600   epoetin (EPOGEN/PROCRIT) injection  4,000 Units Intravenous Q T,Th,Sa-HD   heparin injection (subcutaneous)  5,000 Units Subcutaneous Q8H   multivitamin  1 tablet Per Tube QHS   multivitamin  15 mL Per Tube Daily   mupirocin ointment  1 Application Nasal BID   pantoprazole (PROTONIX) IV  40 mg Intravenous Q24H   sodium chloride flush  3 mL Intravenous Q12H  vancomycin variable dose per unstable renal function (pharmacist dosing)   Does not apply See admin instructions   sodium chloride, acetaminophen, docusate sodium, ondansetron (ZOFRAN) IV, mouth rinse, polyethylene glycol, sodium chloride flush  Assessment/ Plan:  41 y.o. female with end-stage renal disease on peritoneal dialysis chronic pain syndrome, history of diabetes, now diet controlled, hypertension, hyperlipidemia    admitted on 11/20/2021 for Cardiac arrest [facility]) [I46.9]  #End-stage renal disease  with generalized edema Peritoneal dialysis with severe MRSA exit site infection.  Currently being treated with vancomycin IV.    Patient received hemodialysis treatment yesterday, 3 L UF achieved.  Next treatment scheduled for Monday, UF 3 L as tolerated.  Discussed with patient that she will need to be discharged on hemodialysis while PD catheter site heals and she received appropriate antibiotics.  May consider PD catheter placement in the future.  Renal navigator aware of plan and we will begin outpatient dialysis placement this week.  Patient will need PermCath placed by vascular prior to discharge.  #MRSA exit site infection (11/07/21) PD catheter removed on 11/22/21.  Receiving IV antibiotics  #Anemia of chronic kidney disease Lab Results  Component Value Date   HGB 9.2 (L) 11/24/2021    Received blood transfusion this admission. Receiving low-dose EPO with dialysis treatments.  Secondary hyperparathyroidism of renal origin Lab Results  Component Value Date   CALCIUM 7.3 (L) 11/24/2021   PHOS 4.2 11/24/2021   Patient with hypocalcemia and hyperphosphatemia. Calcium slowly improving.  Phosphorus greatly improved.  We will continue to monitor.  Pleural effusion, bilateral.  Seen on chest x-ray.  Thoracentesis performed at bedside to yield 900 cc of straw-colored fluid.  Specimen sent to the lab.   LOS: 4 Estefana Taylor 9/17/202312:41 PM  [facility] [place], [place]

## 2021-11-24 NOTE — Progress Notes (Signed)
Patient remains on vent via PSV mode. Patient noted to be groggy upon assessment for extubation. RT/NP agreed to leave on vent for now in PSV and continue to monitor.

## 2021-11-24 NOTE — Progress Notes (Signed)
Pt became SOB during bath and oxygen saturations dropped to low 80s on HFNC. MD made aware and RN assisted in positioning pt for thoracentesis. Time out performed. Pt Oxygen improved significantly post procedure. Oxygen 96-97% on 4L HFNC post.

## 2021-11-24 NOTE — Progress Notes (Signed)
NAME:  Nichole Wiley, MRN:  [id number], DOB:  1981-02-24, LOS: 4 ADMISSION DATE:  11/20/2021, CONSULTATION DATE:  11/20/2021 REFERRING MD:  Dr. Jacelyn Grip, CHIEF COMPLAINT:  Cardiac Arrest   History of Present Illness:  41 y.o. female whose medical history is most notable for chronic end-stage renal disease on peritoneal dialysis, as well as chronic pain syndrome, pelvic floor dysfunction, prior history of opioid addiction who presented to the hospital on 9/11 for the evaluation of infected peritoneal dialysis cathter. Her nephrologist is [name] who sent her in for an evaluation. She was discharged home the following day and re-admitted on 9/13 to the ED and suffered a cardiac arrest. She was intubated and started on CRRT given multiple electrolyte abnormalities and worsening renal function.     She was supposed to have the peritoneal dialysis catheter removed in favor of a temporary hemodialysis catheter, but that had to be canceled because her fianc just died and she was to have the funeral in about 3 days.  Patient admitted to the ICU where she was continued on CRRT. Yesterday, her PD cathter was removed and pus was drained by surgery. She was started on HD which she tolerated well.  Pertinent  Medical History   Past Medical History:  Diagnosis Date   Abdominal mass    Arthritis    back   Chronic kidney disease    Colon polyp    Diabetes 1.5, managed as type 1 ([place])    Drug addiction ([place])    hx (pain pills after c-sect)   GERD (gastroesophageal reflux disease)    Hypertension    Pelvic floor dysfunction    Urine incontinence     Micro Data:  9/13: SARS-CoV-2 PCR>> negative 9/13: Blood culture x2>> no growth to date 9/13: Urine>> no growth 9/13: MRSA PCR>> positive 9/14: Hepatitis panel>> nonreactive  Antimicrobials:  Cefepime 9/13>> 9/15 Vancomycin 9/13>> Ceftriaxone 9/15>>  Significant Hospital Events: Including procedures, antibiotic start and stop dates in addition to  other pertinent events   9/11 seen for  possible infected catheter in the ER 9/12 discharged home from ER 9/13 presented to ER with cardiac arrest 9/13 VASC CATH PLACED, CRRT STARTED, FIO2 AT 100% 9/14 SEVERE HYPOXIA FIO2 AT 70%. CRRT discontinued 9/15: FiO2 requirements down to 40%.  infected PD catheter removed in OR with General Surgery.  Transfuse 1 unit pRBCs for Hgb 7.0.  Cefepime deescalated to Ceftriaxone 9/16: Tolerated HD. Failed SBT x 2 during the day 9/17: SBT early in the morning successful, extubated to [place].  Interim History / Subjective:  -extubated succesfully  Objective   Blood pressure (!) 148/80, pulse 94, temperature 99.3 F (37.4 C), temperature source Bladder, resp. rate 10, height '5\' 7"'$  (1.702 m), weight 94.4 kg, SpO2 98 %.    Vent Mode: PSV FiO2 (%):  [28 %-40 %] 35 % Set Rate:  [14 bmp-18 bmp] 14 bmp Vt Set:  [450 mL] 450 mL PEEP:  [5 cmH20] 5 cmH20 Pressure Support:  [5 cmH20] 5 cmH20   Intake/Output Summary (Last 24 hours) at 11/24/2021 0754 Last data filed at 11/24/2021 0600 Gross per 24 hour  Intake 2539.46 ml  Output 4000 ml  Net -1460.54 ml    Filed Weights   11/22/21 0319 11/22/21 1300 11/22/21 1705  Weight: 92.2 kg 97.4 kg 94.4 kg    Examination:  Physical Exam Constitutional:      General: She is not in acute distress.    Appearance: She is not ill-appearing.  HENT:     Head: Normocephalic.     Nose: Nose normal.     Mouth/Throat:     Mouth: Mucous membranes are dry.  Eyes:     Pupils: Pupils are equal, round, and reactive to light.  Cardiovascular:     Rate and Rhythm: Normal rate and regular rhythm.     Heart sounds: Normal heart sounds.  Pulmonary:     Effort: Pulmonary effort is normal. No respiratory distress.     Comments: Decreased breath sounds bilaterally  Abdominal:     Palpations: Abdomen is soft.  Neurological:     General: No focal deficit present.     Mental Status: She is alert and oriented to person, place,  and time.     Motor: Weakness present.      Resolved Hospital Problem list     Assessment & Plan:     41 year old female with history of ESRD on peritoneal dialysis presenting for AMS, subsequently developed cardiac arrest in the ED, found to have multiple electrolyte disturbances in the setting of PD catheter infection and sepsis.   Neuro #Toxic Metabolic Encephalopathy   AMS in the setting of multiple electrolyte abnormalities on presentation in the setting of peritoneal catheter malfunction and infection. Intubated for airway protection, admit to ICU for further care. Now extubated, will hold sedating meds.\  -CT head -ve   Pulmonary #Pleural Effusions #Acute Hypoxic Respiratory Failure  Intubated for airway protection, with AMS secondary to electrolyte disturbances and severe sepsis. Passed SBT overnight and successfully extubated. POCUS of the pleural space is notable for bilateral simple appearing and free flowing pleural effusions, R>L. Suspect this is secondary to third spacing and volume overload due to her ESRD. No safe window for thoracentesis yesterday, will re-evaluate today. Will continue with HD and consider thoracentesis should effusions fail to resolve.  -consider thoracentesis   Cardiac #Cardiac Arrest #Severe Sepsis   Septic in the setting of peritoneal catheter infection resulting in worsening electrolyte dysfunction in the setting of ESRD. Suffered a cardiac arrest in the ED, resuscitated. TTE pending.   GI   PPI for prophylaxis while intubated, will discontinue once resumes PO intake. SLP tomorrow.   Renal #ESRD   Peritoneal dialysis at home, now with infected catheter, s/p removal by surgery. Was on CRRT for multiple electrolyte disturbances, now transitioned to HD, first session yesterday was tolerated, another session scheduled for today.  -Monitor electrolytes closely. -HD per nephrology -renal following   Endo   ICU hypo/Hyperglycemia  protocol   Hem/Onc  Heparin for DVT prophylaxis   ID #Severe Sepsis #Peritoneal Catheter Infection   Infected catheter, s/p removal by surgery today. Continue broad spectrum antibiotics pending culture. Switch Cefepime to CTX and continue vancomycin.  -PD catheter removed 9/15 -Continue CTX (day 1 9/14) & Vancomycin (dosed per pharmacy) -f/u PD catheter tip culture (prelim growing S. Aureus)  Best Practice (right click and "Reselect all SmartList Selections" daily)   Diet/type: NPO DVT prophylaxis: SCD GI prophylaxis: PPI Lines: Dialysis Catheter and yes and it is still needed Foley:  Yes, and it is still needed Code Status:  full code Last date of multidisciplinary goals of care discussion [11/22/21]  Labs   CBC: Recent Labs  Lab 11/20/21 1735 11/21/21 0228 11/21/21 1149 11/21/21 2347 11/22/21 0530 11/23/21 0413 11/24/21 0421  WBC 15.1* 12.3*  --   --  8.8 8.7 13.9*  NEUTROABS 10.3* 10.4*  --   --   --   --   --  HGB 9.3* 7.1* 7.4* 7.0* 7.0* 8.9* 9.2*  HCT 30.7* 22.6* 23.3* 22.9* 22.8* 28.1* 29.2*  MCV 93.6 88.6  --   --  91.9 90.1 90.4  PLT 357 239  --   --  184 155 160     Basic Metabolic Panel: Recent Labs  Lab 11/20/21 2155 11/21/21 0228 11/21/21 0413 11/22/21 0530 11/22/21 1857 11/23/21 0413 11/23/21 1640 11/24/21 0421  NA 130*  --    < > 133* 134* 131* 130* 133*  K 5.7*  --    < > 4.1 3.6 3.7 3.7 3.6  CL 93*  --    < > 98 97* 96* 96* 95*  CO2 21*  --    < > '22 26 24 24 27  '$ GLUCOSE 151*  --    < > 107* 73 101* 130* 143*  BUN 73*  --    < > 51* 28* 30* 19 24*  CREATININE 16.14*  --    < > 9.88* 5.87* 6.20* 4.49* 5.15*  CALCIUM 7.8*  --    < > 7.6* 7.4* 7.0* 7.2* 7.3*  MG 2.1 2.0  --  1.8  --  1.7  --  2.1  PHOS  --   --    < > 6.1* 4.0 4.1 3.6 4.2   < > = values in this interval not displayed.    GFR: Estimated Creatinine Clearance: 16.6 mL/min (A) (by C-G formula based on SCr of 5.15 mg/dL (H)). Recent Labs  Lab 11/20/21 1735  11/20/21 2155 11/21/21 0228 11/21/21 1045 11/22/21 0530 11/23/21 0413 11/24/21 0421  PROCALCITON  --   --   --  1.49 4.54 4.32  --   WBC 15.1*  --  12.3*  --  8.8 8.7 13.9*  LATICACIDVEN 7.8* 1.0  --   --   --   --   --      Liver Function Tests: Recent Labs  Lab 11/20/21 1735 11/21/21 0413 11/22/21 0530 11/22/21 1857 11/23/21 0413 11/23/21 1640 11/24/21 0421  AST 43*  --   --   --   --   --   --   ALT 23  --   --   --   --   --   --   ALKPHOS 144*  --   --   --   --   --   --   BILITOT 0.3  --   --   --   --   --   --   PROT 6.3*  --   --   --   --   --   --   ALBUMIN 2.5*   < > 1.8* 1.8* 1.8* 1.9*  2.0* 2.1*   < > = values in this interval not displayed.    No results for input(s): "LIPASE", "AMYLASE" in the last 168 hours. No results for input(s): "AMMONIA" in the last 168 hours.  ABG    Component Value Date/Time   PHART 7.29 (L) 11/20/2021 1810   PCO2ART 37 11/20/2021 1810   PO2ART 170 (H) 11/20/2021 1810   HCO3 17.8 (L) 11/20/2021 1810   ACIDBASEDEF 8.1 (H) 11/20/2021 1810   O2SAT 99.2 11/20/2021 1810     Coagulation Profile: Recent Labs  Lab 11/20/21 1735  INR 1.4*     Cardiac Enzymes: No results for input(s): "CKTOTAL", "CKMB", "CKMBINDEX", "TROPONINI" in the last 168 hours.  HbA1C: Hemoglobin A1C  Date/Time Value Ref Range Status  04/27/2021 12:00 AM 5  Final  Comment:    care everywhere   Hgb A1c MFr Bld  Date/Time Value Ref Range Status  10/01/2021 02:55 AM 4.8 4.8 - 5.6 % Final    Comment:    (NOTE) Pre diabetes:          5.7%-6.4%  Diabetes:              >6.4%  Glycemic control for   <7.0% adults with diabetes     CBG: Recent Labs  Lab 11/23/21 1918 11/23/21 2052 11/23/21 2311 11/24/21 0343 11/24/21 0742  GLUCAP 145* 157* 181* 152* 109*     Review of Systems:   Unable to assess due intubation/sedation/AMS   Past Medical History:  She,  has a past medical history of Abdominal mass, Arthritis, Chronic kidney  disease, Colon polyp, Diabetes 1.5, managed as type 1 ([place]), Drug addiction ([place]), GERD (gastroesophageal reflux disease), Hypertension, Pelvic floor dysfunction, and Urine incontinence.   Surgical History:   Past Surgical History:  Procedure Laterality Date   ABDOMINAL HYSTERECTOMY  2005   CESAREAN SECTION  2003   COLONOSCOPY WITH PROPOFOL N/A 09/14/2015   Procedure: COLONOSCOPY WITH PROPOFOL;  Surgeon: Lucilla Lame, MD;  Location: [place];  Service: Endoscopy;  Laterality: N/A;  Diabetic - insulin   CYSTOSCOPY W/ RETROGRADES Right 07/06/2017   Procedure: CYSTOSCOPY WITH RETROGRADE PYELOGRAM;  Surgeon: Hollice Espy, MD;  Location: [facility];  Service: Urology;  Laterality: Right;   CYSTOSCOPY W/ URETERAL STENT PLACEMENT Right 06/06/2017   Procedure: CYSTOSCOPY WITH RETROGRADE PYELOGRAM/URETERAL STENT PLACEMENT;  Surgeon: Lucas Mallow, MD;  Location: [facility];  Service: Urology;  Laterality: Right;   CYSTOSCOPY W/ URETERAL STENT PLACEMENT Right 07/06/2017   Procedure: CYSTOSCOPY WITH STENT REPLACEMENT;  Surgeon: Hollice Espy, MD;  Location: [facility];  Service: Urology;  Laterality: Right;   ESOPHAGOGASTRODUODENOSCOPY (EGD) WITH PROPOFOL N/A 06/19/2016   Procedure: ESOPHAGOGASTRODUODENOSCOPY (EGD) WITH PROPOFOL;  Surgeon: Jonathon Bellows, MD;  Location: [facility];  Service: Endoscopy;  Laterality: N/A;   LAPAROSCOPY     LYSIS OF ADHESION     POLYPECTOMY  09/14/2015   Procedure: POLYPECTOMY;  Surgeon: Lucilla Lame, MD;  Location: [place];  Service: Endoscopy;;   RIGHT OOPHORECTOMY  2012   TEMPORARY DIALYSIS CATHETER N/A 10/01/2021   Procedure: TEMPORARY DIALYSIS CATHETER;  Surgeon: Algernon Huxley, MD;  Location: [place] [facility];  Service: Cardiovascular;  Laterality: N/A;   TONSILLECTOMY  1987   and adenoids   TUBAL LIGATION  2004   VULVECTOMY  2011   lipoma     Social History:   reports that she has been smoking cigarettes. She has a 12.00 pack-year smoking  history. She has never used smokeless tobacco. She reports that she does not drink alcohol and does not use drugs.   Family History:  Her family history includes Dementia in her maternal grandmother; Depression in her maternal grandmother and mother; Diabetes in her father, maternal grandmother, and paternal grandmother; Heart disease in her maternal grandmother.   Allergies Allergies  Allergen Reactions   Cephalexin Other (See Comments) and Rash    Unknown   Penicillins Rash    Has patient had a PCN reaction causing immediate rash, facial/tongue/throat swelling, SOB or lightheadedness with hypotension: No Has patient had a PCN reaction causing severe rash involving mucus membranes or skin necrosis: No Has patient had a PCN reaction that required hospitalization: No Has patient had a PCN reaction occurring within the last 10 years: No If all of the above  answers are "NO", then may proceed with Cephalosporin use.     Home Medications  Prior to Admission medications   Medication Sig Start Date End Date Taking? Authorizing Provider  aspirin EC 81 MG tablet Take 81 mg by mouth daily.   Yes [provider]  metolazone (ZAROXOLYN) 2.5 MG tablet Take 1 tablet (2.5 mg total) by mouth every other day. 10/29/21  Yes Jearld Fenton, NP  omeprazole (PRILOSEC) 20 MG capsule Take 1 capsule (20 mg total) by mouth daily. 08/07/21  Yes Jearld Fenton, NP  torsemide (DEMADEX) 100 MG tablet Take 1 tablet (100 mg total) by mouth daily. 11/19/21  Yes Hinda Kehr, MD  Accu-Chek Softclix Lancets lancets Use to check blood sugar daily for type 2 diabetes. E11.9 06/27/21   Jearld Fenton, NP  amLODipine (NORVASC) 10 MG tablet Take 1 tablet (10 mg total) by mouth daily. Patient not taking: Reported on 11/18/2021 10/04/21   Fritzi Mandes, MD  gentamicin cream (GARAMYCIN) 0.1 % Apply 1 Application topically daily. Patient not taking: Reported on 11/18/2021 10/04/21   Fritzi Mandes, MD  glucose blood (ACCU-CHEK  GUIDE) test strip 1 each by Other route See admin instructions. 07/08/21   Jearld Fenton, NP  labetalol (NORMODYNE) 100 MG tablet Take 1 tablet (100 mg total) by mouth 3 (three) times daily. Patient not taking: Reported on 11/18/2021 10/03/21   Fritzi Mandes, MD  levothyroxine (SYNTHROID) 25 MCG tablet Take 1 tablet (25 mcg total) by mouth daily. Patient not taking: Reported on 11/18/2021 08/08/21   Jearld Fenton, NP  methadone (DOLOPHINE) 10 MG/ML solution Take 120 mg by mouth daily. Changed by Dr Posey Pronto to get the right home dose w/o sending rx Patient not taking: Reported on 11/20/2021    [provider]  naloxone [facility]) nasal spray 4 mg/0.1 mL One spray in either nostril once for known/suspected opioid overdose. May repeat every 2-3 minutes in alternating nostril til EMS arrives 04/29/21   [provider]     Critical care time: 77 minutes    [name], MD [facility] 11/24/2021 7:54 AM

## 2021-11-25 ENCOUNTER — Encounter: Payer: Self-pay | Admitting: Surgery

## 2021-11-25 ENCOUNTER — Inpatient Hospital Stay: Payer: Medicaid Other

## 2021-11-25 DIAGNOSIS — I469 Cardiac arrest, cause unspecified: Secondary | ICD-10-CM | POA: Diagnosis not present

## 2021-11-25 LAB — GLUCOSE, CAPILLARY
Glucose-Capillary: 117 mg/dL — ABNORMAL HIGH (ref 70–99)
Glucose-Capillary: 102 mg/dL — ABNORMAL HIGH (ref 70–99)
Glucose-Capillary: 121 mg/dL — ABNORMAL HIGH (ref 70–99)
Glucose-Capillary: 97 mg/dL (ref 70–99)
Glucose-Capillary: 123 mg/dL — ABNORMAL HIGH (ref 70–99)
Glucose-Capillary: 136 mg/dL — ABNORMAL HIGH (ref 70–99)
Glucose-Capillary: 163 mg/dL — ABNORMAL HIGH (ref 70–99)

## 2021-11-25 LAB — CULTURE, BLOOD (ROUTINE X 2)
Culture: NO GROWTH
Culture: NO GROWTH
Special Requests: ADEQUATE

## 2021-11-25 LAB — RENAL FUNCTION PANEL
Albumin: 1.9 g/dL — ABNORMAL LOW (ref 3.5–5.0)
Anion gap: 15 (ref 5–15)
BUN: 22 mg/dL — ABNORMAL HIGH (ref 6–20)
CO2: 25 mmol/L (ref 22–32)
Calcium: 7.7 mg/dL — ABNORMAL LOW (ref 8.9–10.3)
Chloride: 97 mmol/L — ABNORMAL LOW (ref 98–111)
Creatinine, Ser: 4.39 mg/dL — ABNORMAL HIGH (ref 0.44–1.00)
GFR, Estimated: 12 mL/min — ABNORMAL LOW (ref >60)
Glucose, Bld: 111 mg/dL — ABNORMAL HIGH (ref 70–99)
Phosphorus: 5.1 mg/dL — ABNORMAL HIGH (ref 2.5–4.6)
Potassium: 3.8 mmol/L (ref 3.5–5.1)
Sodium: 137 mmol/L (ref 135–145)

## 2021-11-25 LAB — CBC
HCT: 29.4 % — ABNORMAL LOW (ref 36.0–46.0)
Hemoglobin: 9.3 g/dL — ABNORMAL LOW (ref 12.0–15.0)
MCH: 28.5 pg (ref 26.0–34.0)
MCHC: 31.6 g/dL (ref 30.0–36.0)
MCV: 90.2 fL (ref 80.0–100.0)
Platelets: 183 10*3/uL (ref 150–400)
RBC: 3.26 MIL/uL — ABNORMAL LOW (ref 3.87–5.11)
RDW: 14.6 % (ref 11.5–15.5)
WBC: 16 10*3/uL — ABNORMAL HIGH (ref 4.0–10.5)
nRBC: 0 % (ref 0.0–0.2)

## 2021-11-25 LAB — MAGNESIUM: Magnesium: 2 mg/dL (ref 1.7–2.4)

## 2021-11-25 MED ORDER — LORAZEPAM 2 MG/ML IJ SOLN
0.5000 mg | Freq: Once | INTRAMUSCULAR | Status: AC
  Administered 2021-11-25: 0.5 mg via INTRAVENOUS
  Filled 2021-11-25: qty 1

## 2021-11-25 MED ORDER — LINEZOLID 600 MG/300ML IV SOLN
600.0000 mg | Freq: Two times a day (BID) | INTRAVENOUS | Status: DC
  Administered 2021-11-25: 600 mg via INTRAVENOUS
  Filled 2021-11-25: qty 300

## 2021-11-25 MED ORDER — FUROSEMIDE 10 MG/ML IJ SOLN
40.0000 mg | Freq: Once | INTRAMUSCULAR | Status: AC
  Administered 2021-11-25: 40 mg via INTRAVENOUS
  Filled 2021-11-25: qty 4

## 2021-11-25 MED ORDER — ACETAMINOPHEN 10 MG/ML IV SOLN
1000.0000 mg | Freq: Four times a day (QID) | INTRAVENOUS | Status: DC
  Administered 2021-11-25: 1000 mg via INTRAVENOUS
  Filled 2021-11-25 (×2): qty 100

## 2021-11-25 MED ORDER — FUROSEMIDE 10 MG/ML IJ SOLN: 120.0000 mg | Freq: Once | INTRAVENOUS | Status: DC

## 2021-11-25 MED ORDER — FENTANYL CITRATE PF 50 MCG/ML IJ SOSY
12.5000 ug | PREFILLED_SYRINGE | INTRAMUSCULAR | Status: DC | PRN
  Administered 2021-11-25: 12.5 ug via INTRAVENOUS
  Administered 2021-11-25: 25 ug via INTRAVENOUS
  Administered 2021-11-25: 12.5 ug via INTRAVENOUS
  Administered 2021-11-28: 25 ug via INTRAVENOUS
  Filled 2021-11-25 (×3): qty 1

## 2021-11-25 MED ORDER — LINEZOLID 600 MG PO TABS
600.0000 mg | ORAL_TABLET | Freq: Two times a day (BID) | ORAL | Status: DC
  Administered 2021-11-25 – 2021-11-29 (×6): 600 mg via ORAL
  Filled 2021-11-25 (×8): qty 1

## 2021-11-25 MED ORDER — ENSURE ENLIVE PO LIQD
237.0000 mL | Freq: Three times a day (TID) | ORAL | Status: DC
  Administered 2021-11-26 – 2021-11-29 (×5): 237 mL via ORAL

## 2021-11-25 MED ORDER — HEPARIN SODIUM (PORCINE) 1000 UNIT/ML IJ SOLN
INTRAMUSCULAR | Status: AC
  Filled 2021-11-25: qty 10

## 2021-11-25 MED ORDER — ACETAMINOPHEN 500 MG PO TABS
1000.0000 mg | ORAL_TABLET | Freq: Four times a day (QID) | ORAL | Status: DC
  Administered 2021-11-25: 1000 mg via ORAL
  Filled 2021-11-25: qty 2

## 2021-11-25 MED ORDER — LINEZOLID 600 MG PO TABS
600.0000 mg | ORAL_TABLET | Freq: Two times a day (BID) | ORAL | Status: DC
  Filled 2021-11-25: qty 1

## 2021-11-25 NOTE — Consult Note (Signed)
[place] for Electrolyte Monitoring and Replacement   Recent Labs: Potassium (mmol/L)  Date Value  11/25/2021 3.8  01/20/2012 3.5   Magnesium (mg/dL)  Date Value  11/25/2021 2.0   Calcium (mg/dL)  Date Value  11/25/2021 7.7 (L)   Calcium, Total (mg/dL)  Date Value  01/20/2012 8.7   Albumin (g/dL)  Date Value  11/25/2021 1.9 (L)  01/20/2012 2.9 (L)   Phosphorus (mg/dL)  Date Value  11/25/2021 5.1 (H)   Sodium (mmol/L)  Date Value  11/25/2021 137  01/20/2012 127 (L)   Assessment: [name] with medical history including ESRD on PD, 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   Goal of Therapy:  Electrolytes within normal limits Preference K ~ 4 and Mg ~ 2 given cardiac arrest  Plan:  2.5hrs HD on 9/17; next HD planned for 9/18 per nephro. K 3.8>3.8  - stable WNL; Will allow HD to maintain. Mg 2.0 - WNL; no replacement needed at this time.  Phos 5.5>5.1 - Will correct with HD on 9/18 Corrected Ca (alb 1.9) 9.4. WNL; no replacement needed at this time.  --Follow-up electrolytes with AM labs tomorrow  Lorna Dibble 11/25/2021 9:03 AM

## 2021-11-25 NOTE — Progress Notes (Addendum)
Nutrition Follow Up Note  DOCUMENTATION CODES:   Obesity unspecified  INTERVENTION:   Ensure Enlive po TID, each supplement provides 350 kcal and 20 grams of protein.  Rena-vit po daily  Mechanical soft diet- renal diet restrictions in [facility]   Pt at moderate refeed risk; recommend monitor potassium, magnesium and phosphorus labs daily until stable  NUTRITION DIAGNOSIS:   Inadequate oral intake related to inability to eat (pt sedated and ventilated) as evidenced by NPO status.  GOAL:   Patient will meet greater than or equal to 90% of their needs -not met   MONITOR:   Diet advancement, Labs, Weight trends, TF tolerance, Skin, I & O's  ASSESSMENT:   41 y/o female with h/o ESRD on PD (since 2021), HTN, HLD, Type I DM (diagnosed at age 17), hypothyroidism, depression, anxiety, GERD, chronic constipation, opiod dependence and ureterolithiasis who is admitted with cardiac arrest and possible PD catheter infection.  Pt extubated yesterday. Pt seen by SLP this morning but pt with lethargy, holding ice in her mouth and reporting that she is not ready to eat yet. Plan was for NGT and nutrition support but pt more alert this evening, was re-evaluated by SLP and placed on a diet. Psychiatry consult pending. Pt continues on HD. PD catheter has been removed. Per chart, pt is down ~10lbs since admission; pt -6.3L on her I & Os. Pt appears to remain up ~18lbs from her last documented weight on 9/11. RD will add supplements and vitamins to help pt meet her estimated needs. Mechanical soft diet with renal restrictions in [facility].   Medications reviewed and include: epoetin, heparin, rena-vit, MVI, protonix, linezolid  Labs reviewed: BUN 22(H), creat 4.39(H), P 5.1(H), Mg 2.0 wnl Wbc- 16.0(H), Hgb 9.3(L), Hct 29.4(L) Cbgs- 121, 102 x 24 hrs  Diet Order:   Diet Order             Diet NPO time specified  Diet effective now                  EDUCATION NEEDS:   No education  needs have been identified at this time  Skin:  Skin Assessment: Reviewed RN Assessment  Last BM:  9/18- type 4  Height:   Ht Readings from Last 1 Encounters:  11/20/21 5' 7" (1.702 m)    Weight:   Wt Readings from Last 1 Encounters:  11/25/21 87.5 kg    Ideal Body Weight:  61.3 kg  BMI:  Body mass index is 30.21 kg/m.  Estimated Nutritional Needs:   Kcal:  2000-2300kcal/day  Protein:  100-115g/day  Fluid:  UOP +1L  Casey Campbell MS, RD, LDN Please refer to [facility] for RD and/or RD on-call/weekend/after hours pager 

## 2021-11-25 NOTE — Progress Notes (Signed)
NAME:  Nichole Wiley, MRN:  [id number], DOB:  1980/05/08, LOS: 5 ADMISSION DATE:  11/20/2021, CONSULTATION DATE:  11/20/2021 REFERRING MD:  Dr. Jacelyn Grip, CHIEF COMPLAINT:  Cardiac Arrest   History of Present Illness:  41 y.o. female whose medical history is most notable for chronic end-stage renal disease on peritoneal dialysis, as well as chronic pain syndrome, pelvic floor dysfunction, prior history of opioid addiction who presented to the hospital on 9/11 for the evaluation of infected peritoneal dialysis cathter. Her nephrologist is [name] who sent her in for an evaluation. She was discharged home the following day and re-admitted on 9/13 to the ED and suffered a cardiac arrest. She was intubated and started on CRRT given multiple electrolyte abnormalities and worsening renal function.     She was supposed to have the peritoneal dialysis catheter removed in favor of a temporary hemodialysis catheter, but that had to be canceled because her fianc just died and she was to have the funeral in about 3 days.  Patient admitted to the ICU where she was continued on CRRT. Yesterday, her PD cathter was removed and pus was drained by surgery. She was started on HD which she tolerated well.  Pertinent  Medical History   Past Medical History:  Diagnosis Date   Abdominal mass    Arthritis    back   Chronic kidney disease    Colon polyp    Diabetes 1.5, managed as type 1 ([place])    Drug addiction ([place])    hx (pain pills after c-sect)   GERD (gastroesophageal reflux disease)    Hypertension    Pelvic floor dysfunction    Urine incontinence     Micro Data:  9/13: SARS-CoV-2 PCR>> negative 9/13: Blood culture x2>> no growth to date 9/13: Urine>> no growth 9/13: MRSA PCR>> positive 9/14: Hepatitis panel>> nonreactive 9/15: PD Catheter tip>> MRSA (resistant to Ciprofloxacin & Oxacillin) 9/17: Pleural fluid>>  Antimicrobials:  Cefepime 9/13>> 9/15 Vancomycin 9/13>>9/18 Ceftriaxone  9/15>>9/18 Linezolid 9/18>>  Significant Hospital Events: Including procedures, antibiotic start and stop dates in addition to other pertinent events   9/11 seen for  possible infected catheter in the ER 9/12 discharged home from ER 9/13 presented to ER with cardiac arrest 9/13 VASC CATH PLACED, CRRT STARTED, FIO2 AT 100% 9/14 SEVERE HYPOXIA FIO2 AT 70%. CRRT discontinued 9/15: FiO2 requirements down to 40%.  infected PD catheter removed in OR with General Surgery.  Transfuse 1 unit pRBCs for Hgb 7.0.  Cefepime deescalated to Ceftriaxone 9/16: Tolerated HD. Failed SBT x 2 during the day 9/17: SBT early in the morning successful, extubated to [place]. Thoracentesis performed 9/18: Tolerating extubation well.  Hemodynamically stable.  Tolerated HD.  Speech evaluation pending.  ABX changed to Linzolid  Interim History / Subjective:  -Extubated yesterday 9/17, remains stable from respiratory standpoint -No significant events noted overnight -Afebrile, hemodynamically stable, currently receiving HD and tolerating -Pt lethargic after getting 0.5 mg Ativan earlier this am -Speech attempted to evaluate swallowing, however too lethargic, will have to consider placing Dobhoff ~ now is awake and complaining of chronic back pain 9/10 ~ will order for prn fentanyl and tylenol   Objective   Blood pressure (!) 148/97, pulse (!) 109, temperature (!) 97.3 F (36.3 C), temperature source Bladder, resp. rate (!) 27, height '5\' 7"'$  (1.702 m), weight 87.5 kg, SpO2 99 %.    FiO2 (%):  [100 %] 100 %   Intake/Output Summary (Last 24 hours) at 11/25/2021 0958 Last data  filed at 11/25/2021 0800 Gross per 24 hour  Intake 171 ml  Output 2650 ml  Net -2479 ml    Filed Weights   11/22/21 1705 11/24/21 1545 11/25/21 0745  Weight: 94.4 kg 90 kg 87.5 kg    Examination:  Physical Exam Constitutional:      General: She is not in acute distress.    Appearance: Normal appearance. She is not ill-appearing.  HENT:      Head: Normocephalic and atraumatic.     Nose: Nose normal.     Mouth/Throat:     Mouth: Mucous membranes are dry.  Eyes:     Pupils: Pupils are equal, round, and reactive to light.  Cardiovascular:     Rate and Rhythm: Normal rate and regular rhythm.     Heart sounds: Normal heart sounds.  Pulmonary:     Effort: Pulmonary effort is normal. No respiratory distress.     Breath sounds: Normal breath sounds.     Comments: Decreased breath sounds bilaterally  Abdominal:     General: There is no distension.     Palpations: Abdomen is soft.     Tenderness: There is no abdominal tenderness. There is no guarding.  Musculoskeletal:        General: Normal range of motion.     Cervical back: Normal range of motion.  Skin:    General: Skin is warm and dry.     Capillary Refill: Capillary refill takes less than 2 seconds.  Neurological:     General: No focal deficit present.     Mental Status: She is alert and oriented to person, place, and time.     Motor: Weakness present.  Psychiatric:        Mood and Affect: Mood normal.      Resolved Hospital Problem list     Assessment & Plan:     41 year old female with history of ESRD on peritoneal dialysis presenting for AMS, subsequently developed cardiac arrest in the ED, found to have multiple electrolyte disturbances in the setting of PD catheter infection and sepsis.    #Toxic Metabolic Encephalopathy ~ RESOLVED AMS in the setting of multiple electrolyte abnormalities on presentation in the setting of peritoneal catheter malfunction and infection. Intubated for airway protection, admit to ICU for further care. Now extubated, will hold sedating meds. -Treat metabolic derangements as listed below -Provide supporitve care -CT Head negative on presentation -Avoid sedating meds as able -Once awake, will resume home Methadone -Consult Psych for depression    #Pleural Effusions, s/p thoracentesis 9/17 #Acute Hypoxic Respiratory  Failure Intubated for airway protection, with AMS secondary to electrolyte disturbances and severe sepsis.  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 Suffered a cardiac arrest in the ED, resuscitated. TTE pending. -Continuous cardiac monitoring -Maintain MAP >65 -Vasopressors as needed to maintain MAP goal ~ not requiring -HS Troponin peaked at 532 -Volume removal with HD -Echocardiogram 11/23/21: LVEF 40-45%, indeterminate diastolic parameters, RV systolic function normal  #ESRD Peritoneal dialysis at home, now with infected catheter, s/p removal by surgery. Was on CRRT for multiple electrolyte disturbances, now transitioned to HD, first session yesterday was tolerated, another session scheduled for  today. -Monitor I&O's / urinary output -Follow BMP -Ensure adequate renal perfusion -Avoid nephrotoxic agents as able -Replace electrolytes as indicated -Nephrology following, appreciate input -HD as per Nephrology -Will eventually need placement of Permcath ~ will defer to Nephrology regarding timing of obtaining Vascular Surgery consult  Endo -CBG's q4h; Target range of 140 to 180 -SSI -Follow ICU Hypo/Hyperglycemia protocol  Anemia of Chronic Disease -Monitor for S/Sx of bleeding -Trend CBC -Heparin SQ  for VTE Prophylaxis  -Transfuse for Hgb <7   #Severe Sepsis #Peritoneal Catheter Infection, s/p removal in OR 11/22/21 -Monitor fever curve -Trend WBC's & Procalcitonin -Follow cultures as above -Continue Linezolid pending cultures & sensitivities      Best Practice (right click and "Reselect  all SmartList Selections" daily)   Diet/type: NPO, speech evaluation pending DVT prophylaxis: Heparin SQ GI prophylaxis: PPI Lines: Dialysis Catheter and yes and it is still needed Foley:  Yes, and it is still needed Code Status:  full code Last date of multidisciplinary goals of care discussion [11/25/21]  Pt updated at bedside, all questions answered  Labs   CBC: Recent Labs  Lab 11/20/21 1735 11/21/21 0228 11/21/21 1149 11/21/21 2347 11/22/21 0530 11/23/21 0413 11/24/21 0421 11/25/21 0427  WBC 15.1* 12.3*  --   --  8.8 8.7 13.9* 16.0*  NEUTROABS 10.3* 10.4*  --   --   --   --   --   --   HGB 9.3* 7.1*   < > 7.0* 7.0* 8.9* 9.2* 9.3*  HCT 30.7* 22.6*   < > 22.9* 22.8* 28.1* 29.2* 29.4*  MCV 93.6 88.6  --   --  91.9 90.1 90.4 90.2  PLT 357 239  --   --  184 155 160 183   < > = values in this interval not displayed.     Basic Metabolic Panel: Recent Labs  Lab 11/21/21 0228 11/21/21 0413 11/22/21 0530 11/22/21 1857 11/23/21 0413 11/23/21 1640 11/24/21 0421 11/24/21 1600 11/25/21 0427  NA  --    < > 133*   < > 131* 130* 133* 133* 137  K  --    < > 4.1   < > 3.7 3.7 3.6 3.8 3.8  CL  --    < > 98   < > 96* 96* 95* 97* 97*  CO2  --    < > 22   < > '24 24 27 22 25  '$ GLUCOSE  --    < > 107*   < > 101* 130* 143* 156* 111*  BUN  --    < > 51*   < > 30* 19 24* 28* 22*  CREATININE  --    < > 9.88*   < > 6.20* 4.49* 5.15* 5.43* 4.39*  CALCIUM  --    < > 7.6*   < > 7.0* 7.2* 7.3* 7.3* 7.7*  MG 2.0  --  1.8  --  1.7  --  2.1  --  2.0  PHOS  --    < > 6.1*   < > 4.1 3.6 4.2 5.5* 5.1*   < > = values in this interval not displayed.    GFR: Estimated Creatinine Clearance: 18.8 mL/min (A) (by C-G formula based on SCr of 4.39 mg/dL (H)). Recent Labs  Lab 11/20/21 1735 11/20/21 2155 11/21/21 0228 11/21/21 1045 11/22/21 0530 11/23/21 0413 11/24/21 0421 11/25/21 0427  PROCALCITON  --   --   --  1.49 4.54 4.32  --   --  WBC 15.1*  --    < >  --  8.8 8.7 13.9* 16.0*   LATICACIDVEN 7.8* 1.0  --   --   --   --   --   --    < > = values in this interval not displayed.     Liver Function Tests: Recent Labs  Lab 11/20/21 1735 11/21/21 0413 11/23/21 0413 11/23/21 1640 11/24/21 0421 11/24/21 1600 11/25/21 0427  AST 43*  --   --   --   --   --   --   ALT 23  --   --   --   --   --   --   ALKPHOS 144*  --   --   --   --   --   --   BILITOT 0.3  --   --   --   --   --   --   PROT 6.3*  --   --   --   --   --   --   ALBUMIN 2.5*   < > 1.8* 1.9*  2.0* 2.1* 2.0* 1.9*   < > = values in this interval not displayed.    No results for input(s): "LIPASE", "AMYLASE" in the last 168 hours. No results for input(s): "AMMONIA" in the last 168 hours.  ABG    Component Value Date/Time   PHART 7.29 (L) 11/20/2021 1810   PCO2ART 37 11/20/2021 1810   PO2ART 170 (H) 11/20/2021 1810   HCO3 17.8 (L) 11/20/2021 1810   ACIDBASEDEF 8.1 (H) 11/20/2021 1810   O2SAT 99.2 11/20/2021 1810     Coagulation Profile: Recent Labs  Lab 11/20/21 1735  INR 1.4*     Cardiac Enzymes: No results for input(s): "CKTOTAL", "CKMB", "CKMBINDEX", "TROPONINI" in the last 168 hours.  HbA1C: Hemoglobin A1C  Date/Time Value Ref Range Status  04/27/2021 12:00 AM 5  Final    Comment:    care everywhere   Hgb A1c MFr Bld  Date/Time Value Ref Range Status  10/01/2021 02:55 AM 4.8 4.8 - 5.6 % Final    Comment:    (NOTE) Pre diabetes:          5.7%-6.4%  Diabetes:              >6.4%  Glycemic control for   <7.0% adults with diabetes     CBG: Recent Labs  Lab 11/24/21 1239 11/24/21 1555 11/24/21 2157 11/25/21 0413 11/25/21 0732  GLUCAP 134* 129* 117* 102* 121*     Review of Systems:   Positives in BOLD: Gen: Denies fever, chills, weight change, fatigue, night sweats, chronic back pain HEENT: Denies blurred vision, double vision, hearing loss, tinnitus, sinus congestion, rhinorrhea, sore throat, neck stiffness, dysphagia PULM: Denies shortness of breath,  cough, sputum production, hemoptysis, wheezing CV: Denies chest pain, edema, orthopnea, paroxysmal nocturnal dyspnea, palpitations GI: Denies abdominal pain, nausea, vomiting, diarrhea, hematochezia, melena, constipation, change in bowel habits GU: Denies dysuria, hematuria, polyuria, oliguria, urethral discharge Endocrine: Denies hot or cold intolerance, polyuria, polyphagia or appetite change Derm: Denies rash, dry skin, scaling or peeling skin change Heme: Denies easy bruising, bleeding, bleeding gums Neuro: Denies headache, numbness, weakness, slurred speech, loss of memory or consciousness    Past Medical History:  She,  has a past medical history of Abdominal mass, Arthritis, Chronic kidney disease, Colon polyp, Diabetes 1.5, managed as type 1 ([place]), Drug addiction ([place]), GERD (gastroesophageal reflux disease), Hypertension, Pelvic floor dysfunction, and Urine incontinence.  Surgical History:   Past Surgical History:  Procedure Laterality Date   ABDOMINAL HYSTERECTOMY  2005   CAPD REMOVAL N/A 11/22/2021   Procedure: CONTINUOUS AMBULATORY PERITONEAL DIALYSIS  (CAPD) CATHETER REMOVAL;  Surgeon: Benjamine Sprague, DO;  Location: [facility];  Service: General;  Laterality: N/A;   CESAREAN SECTION  2003   COLONOSCOPY WITH PROPOFOL N/A 09/14/2015   Procedure: COLONOSCOPY WITH PROPOFOL;  Surgeon: Lucilla Lame, MD;  Location: [facility];  Service: Endoscopy;  Laterality: N/A;  Diabetic - insulin   CYSTOSCOPY W/ RETROGRADES Right 07/06/2017   Procedure: CYSTOSCOPY WITH RETROGRADE PYELOGRAM;  Surgeon: Hollice Espy, MD;  Location: [facility];  Service: Urology;  Laterality: Right;   CYSTOSCOPY W/ URETERAL STENT PLACEMENT Right 06/06/2017   Procedure: CYSTOSCOPY WITH RETROGRADE PYELOGRAM/URETERAL STENT PLACEMENT;  Surgeon: Lucas Mallow, MD;  Location: [facility];  Service: Urology;  Laterality: Right;   CYSTOSCOPY W/ URETERAL STENT PLACEMENT Right 07/06/2017   Procedure: CYSTOSCOPY WITH STENT  REPLACEMENT;  Surgeon: Hollice Espy, MD;  Location: [facility];  Service: Urology;  Laterality: Right;   ESOPHAGOGASTRODUODENOSCOPY (EGD) WITH PROPOFOL N/A 06/19/2016   Procedure: ESOPHAGOGASTRODUODENOSCOPY (EGD) WITH PROPOFOL;  Surgeon: Jonathon Bellows, MD;  Location: [facility];  Service: Endoscopy;  Laterality: N/A;   LAPAROSCOPY     LYSIS OF ADHESION     POLYPECTOMY  09/14/2015   Procedure: POLYPECTOMY;  Surgeon: Lucilla Lame, MD;  Location: [facility];  Service: Endoscopy;;   RIGHT OOPHORECTOMY  2012   TEMPORARY DIALYSIS CATHETER N/A 10/01/2021   Procedure: TEMPORARY DIALYSIS CATHETER;  Surgeon: Algernon Huxley, MD;  Location: [facility];  Service: Cardiovascular;  Laterality: N/A;   TONSILLECTOMY  1987   and adenoids   TUBAL LIGATION  2004   VULVECTOMY  2011   lipoma     Social History:   reports that she has been smoking cigarettes. She has a 12.00 pack-year smoking history. She has never used smokeless tobacco. She reports that she does not drink alcohol and does not use drugs.   Family History:  Her family history includes Dementia in her maternal grandmother; Depression in her maternal grandmother and mother; Diabetes in her father, maternal grandmother, and paternal grandmother; Heart disease in her maternal grandmother.   Allergies Allergies  Allergen Reactions   Cephalexin Other (See Comments) and Rash    Unknown   Penicillins Rash    Has patient had a PCN reaction causing immediate rash, facial/tongue/throat swelling, SOB or lightheadedness with hypotension: No Has patient had a PCN reaction causing severe rash involving mucus membranes or skin necrosis: No Has patient had a PCN reaction that required hospitalization: No Has patient had a PCN reaction occurring within the last 10 years: No If all of the above answers are "NO", then may proceed with Cephalosporin use.     Home Medications  Prior to Admission medications   Medication Sig Start Date End  Date Taking? Authorizing Provider  aspirin EC 81 MG tablet Take 81 mg by mouth daily.   Yes [provider]  metolazone (ZAROXOLYN) 2.5 MG tablet Take 1 tablet (2.5 mg total) by mouth every other day. 10/29/21  Yes Jearld Fenton, NP  omeprazole (PRILOSEC) 20 MG capsule Take 1 capsule (20 mg total) by mouth daily. 08/07/21  Yes Jearld Fenton, NP  torsemide (DEMADEX) 100 MG tablet Take 1 tablet (100 mg total) by mouth daily. 11/19/21  Yes Hinda Kehr, MD  Accu-Chek Softclix Lancets lancets Use to check blood sugar daily  for type 2 diabetes. E11.9 06/27/21   Jearld Fenton, NP  amLODipine (NORVASC) 10 MG tablet Take 1 tablet (10 mg total) by mouth daily. Patient not taking: Reported on 11/18/2021 10/04/21   Fritzi Mandes, MD  gentamicin cream (GARAMYCIN) 0.1 % Apply 1 Application topically daily. Patient not taking: Reported on 11/18/2021 10/04/21   Fritzi Mandes, MD  glucose blood (ACCU-CHEK GUIDE) test strip 1 each by Other route See admin instructions. 07/08/21   Jearld Fenton, NP  labetalol (NORMODYNE) 100 MG tablet Take 1 tablet (100 mg total) by mouth 3 (three) times daily. Patient not taking: Reported on 11/18/2021 10/03/21   Fritzi Mandes, MD  levothyroxine (SYNTHROID) 25 MCG tablet Take 1 tablet (25 mcg total) by mouth daily. Patient not taking: Reported on 11/18/2021 08/08/21   Jearld Fenton, NP  methadone (DOLOPHINE) 10 MG/ML solution Take 120 mg by mouth daily. Changed by Dr Posey Pronto to get the right home dose w/o sending rx Patient not taking: Reported on 11/20/2021    [provider]  naloxone [facility]) nasal spray 4 mg/0.1 mL One spray in either nostril once for known/suspected opioid overdose. May repeat every 2-3 minutes in alternating nostril til EMS arrives 04/29/21   [provider]     Critical care time: 38 minutes    Darel Hong, AGACNP-BC [facility] epic messenger for cross cover needs If after hours, please call  E-link  11/25/2021 9:58 AM

## 2021-11-25 NOTE — Progress Notes (Signed)
Hd tx of 3.5hrs completed. 74L total vol processed. No complications. Report given to [employer], rn. Total uf removed: 304m Post hd wt: 84.5kgs  Post hd v/s: 97.5 148/88(106) 107 14 100%

## 2021-11-25 NOTE — Progress Notes (Signed)
Subjective:  CC: Nichole Wiley is a 41 y.o. female  Hospital stay day 5, 3 Days Post-Op PD cath removal  HPI: No acute issues overnight with PD sites.  ROS:  General: Denies weight loss, weight gain, fatigue, fevers, chills, and night sweats. Heart: Denies chest pain, palpitations, racing heart, irregular heartbeat, leg pain or swelling, and decreased activity tolerance. Respiratory: Denies breathing difficulty, shortness of breath, wheezing, cough, and sputum. GI: Denies change in appetite, heartburn, nausea, vomiting, constipation, diarrhea, and blood in stool. GU: Denies difficulty urinating, pain with urinating, urgency, frequency, blood in urine.   Objective:   Temp:  [96.3 F (35.7 C)-98.1 F (36.7 C)] 97.7 F (36.5 C) (09/18 1400) Pulse Rate:  [96-109] 107 (09/18 1400) Resp:  [10-28] 18 (09/18 1400) BP: (132-164)/(72-125) 150/93 (09/18 1400) SpO2:  [95 %-100 %] 100 % (09/18 1400) FiO2 (%):  [100 %] 100 % (09/18 1200) Weight:  [87.5 kg-90 kg] 87.5 kg (09/18 0745)     Height: '5\' 7"'$  (170.2 cm) Weight: 87.5 kg BMI (Calculated): 30.21   Intake/Output this shift:   Intake/Output Summary (Last 24 hours) at 11/25/2021 1523 Last data filed at 11/25/2021 1150 Gross per 24 hour  Intake 100 ml  Output 5375 ml  Net -5275 ml    Constitutional :  alert, cooperative, appears stated age, and no distress  Respiratory:  clear to auscultation bilaterally  Cardiovascular:  regular rate and rhythm  Gastrointestinal: soft, non-tender; bowel sounds normal; no masses,  no organomegaly.   Skin: Cool and moist. PD cath and staple lines C/D/I.  Psychiatric: Normal affect, non-agitated, not confused       LABS:     Latest Ref Rng & Units 11/25/2021    4:27 AM 11/24/2021    4:00 PM 11/24/2021    4:21 AM  CMP  Glucose 70 - 99 mg/dL 111  156  143   BUN 6 - 20 mg/dL '22  28  24   '$ Creatinine 0.44 - 1.00 mg/dL 4.39  5.43  5.15   Sodium 135 - 145 mmol/L 137  133  133   Potassium 3.5 - 5.1  mmol/L 3.8  3.8  3.6   Chloride 98 - 111 mmol/L 97  97  95   CO2 22 - 32 mmol/L '25  22  27   '$ Calcium 8.9 - 10.3 mg/dL 7.7  7.3  7.3       Latest Ref Rng & Units 11/25/2021    4:27 AM 11/24/2021    4:21 AM 11/23/2021    4:13 AM  CBC  WBC 4.0 - 10.5 K/uL 16.0  13.9  8.7   Hemoglobin 12.0 - 15.0 g/dL 9.3  9.2  8.9   Hematocrit 36.0 - 46.0 % 29.4  29.2  28.1   Platelets 150 - 400 K/uL 183  160  155     RADS: N/a Assessment:   S/p PD cath removal. Sites looking clean, dressing changed.  Continue daily changes.  Staples likely out in 3-4days.    Call surgery with questions in the meantime  labs/images/medications/previous chart entries reviewed personally and relevant changes/updates noted above.

## 2021-11-25 NOTE — Progress Notes (Signed)
Pts mother brought pts locked methadone cooler in, I advised pt and her mother that per md we have our own methadone we can dispense when the doctor orders it. Due to her being so lethargic and drowsy today we will reassess starting the methadone tomorrow, they understood and pts mother took locked methadone cooler back home.

## 2021-11-25 NOTE — Evaluation (Signed)
Clinical/Bedside Swallow Evaluation Patient Details  Name: Nichole Wiley MRN: [id number] Date of Birth: 07/03/1980  Today's Date: 11/25/2021 Time: SLP Start Time (ACUTE ONLY): 0945 SLP Stop Time (ACUTE ONLY): 1000 SLP Time Calculation (min) (ACUTE ONLY): 15 min  Past Medical History:  Past Medical History:  Diagnosis Date   Abdominal mass    Arthritis    back   Chronic kidney disease    Colon polyp    Diabetes 1.5, managed as type 1 ([place])    Drug addiction ([place])    hx (pain pills after c-sect)   GERD (gastroesophageal reflux disease)    Hypertension    Pelvic floor dysfunction    Urine incontinence    Past Surgical History:  Past Surgical History:  Procedure Laterality Date   ABDOMINAL HYSTERECTOMY  2005   CAPD REMOVAL N/A 11/22/2021   Procedure: CONTINUOUS AMBULATORY PERITONEAL DIALYSIS  (CAPD) CATHETER REMOVAL;  Surgeon: Benjamine Sprague, DO;  Location: [facility];  Service: General;  Laterality: N/A;   CESAREAN SECTION  2003   COLONOSCOPY WITH PROPOFOL N/A 09/14/2015   Procedure: COLONOSCOPY WITH PROPOFOL;  Surgeon: Lucilla Lame, MD;  Location: [facility];  Service: Endoscopy;  Laterality: N/A;  Diabetic - insulin   CYSTOSCOPY W/ RETROGRADES Right 07/06/2017   Procedure: CYSTOSCOPY WITH RETROGRADE PYELOGRAM;  Surgeon: Hollice Espy, MD;  Location: [facility];  Service: Urology;  Laterality: Right;   CYSTOSCOPY W/ URETERAL STENT PLACEMENT Right 06/06/2017   Procedure: CYSTOSCOPY WITH RETROGRADE PYELOGRAM/URETERAL STENT PLACEMENT;  Surgeon: Lucas Mallow, MD;  Location: [facility];  Service: Urology;  Laterality: Right;   CYSTOSCOPY W/ URETERAL STENT PLACEMENT Right 07/06/2017   Procedure: CYSTOSCOPY WITH STENT REPLACEMENT;  Surgeon: Hollice Espy, MD;  Location: [facility];  Service: Urology;  Laterality: Right;   ESOPHAGOGASTRODUODENOSCOPY (EGD) WITH PROPOFOL N/A 06/19/2016   Procedure: ESOPHAGOGASTRODUODENOSCOPY (EGD) WITH PROPOFOL;  Surgeon: Jonathon Bellows, MD;  Location: [facility];  Service: Endoscopy;  Laterality: N/A;   LAPAROSCOPY     LYSIS OF ADHESION     POLYPECTOMY  09/14/2015   Procedure: POLYPECTOMY;  Surgeon: Lucilla Lame, MD;  Location: [facility];  Service: Endoscopy;;   RIGHT OOPHORECTOMY  2012   TEMPORARY DIALYSIS CATHETER N/A 10/01/2021   Procedure: TEMPORARY DIALYSIS CATHETER;  Surgeon: Algernon Huxley, MD;  Location: [facility];  Service: Cardiovascular;  Laterality: N/A;   TONSILLECTOMY  1987   and adenoids   TUBAL LIGATION  2004   VULVECTOMY  2011   lipoma   HPI:  41 y.o. female whose medical history is most notable for chronic end-stage renal disease on peritoneal dialysis, as well as chronic pain syndrome, pelvic floor dysfunction, prior history of opioid addiction who presented to the hospital on 9/11 for the evaluation of infected peritoneal dialysis cathter. Her nephrologist is [name] who sent her in for an evaluation. She was discharged home the following day and re-admitted on 9/13 to the ED and suffered a cardiac arrest. She was intubated and started on CRRT given multiple electrolyte abnormalities and worsening renal function. Intubated 9/13-9/17. Pt now transitioned to nasal canula. Pt afebrile, though WBC is trending up at 16.0. Pt alert, though drowsy and agreeable to SLP evaluation.    Assessment / Plan / Recommendation  Clinical Impression  Pt seen for bedside swallow assessment in the setting of acute respiratory failure/cardiac arrest and s/p extubation. Pt extubated on 9/17, and is currently NPO on 5L nasal canula. Pt with weak, breathy voice and weak  volitional and reflexive cough. Pt seen with trials of ice and thin liquids (cup and spoon). Pt with immediate cough and increased wet vocal quality with cup sips and ice chips. Oral manipulation of ice limited secondary to intermittent lethargy and altered mentation, for example, pt closing eyes and appearing to try to sleep with ice in oral cavity. No overt change  in vitals, with O2 saturations maintained at greater than 95 for duration of trials. Based on suspected aspiration risk and current lethargy/mentation, recommend continued NPO with allowance for ice chips following oral care. Education shared with pt regarding likely need for alternative means of nutrition. Pt endorsed understanding. MD and RN aware and in agreement with plans. SLP Visit Diagnosis: Dysphagia, oropharyngeal phase (R13.12)    Aspiration Risk  Moderate aspiration risk    Diet Recommendation   NPO - ice chips following oral care  Medication Administration: Via alternative means    Other  Recommendations Oral Care Recommendations: Oral care prior to ice chip/H20;Oral care BID    Recommendations for follow up therapy are one component of a multi-disciplinary discharge planning process, led by the attending physician.  Recommendations may be updated based on patient status, additional functional criteria and insurance authorization.  Follow up Recommendations  (TBD)      Assistance Recommended at Discharge Frequent or constant Supervision/Assistance  Functional Status Assessment Patient has had a recent decline in their functional status and demonstrates the ability to make significant improvements in function in a reasonable and predictable amount of time.  Frequency and Duration min 2x/week  1 week       Prognosis Prognosis for Safe Diet Advancement: Good Barriers to Reach Goals: Behavior;Cognitive deficits      Swallow Study   General Date of Onset: 11/25/21 HPI: 41 y.o. female whose medical history is most notable for chronic end-stage renal disease on peritoneal dialysis, as well as chronic pain syndrome, pelvic floor dysfunction, prior history of opioid addiction who presented to the hospital on 9/11 for the evaluation of infected peritoneal dialysis cathter. Her nephrologist is [name] who sent her in for an evaluation. She was discharged home the following day and  re-admitted on 9/13 to the ED and suffered a cardiac arrest. She was intubated and started on CRRT given multiple electrolyte abnormalities and worsening renal function. Intubated 9/13-9/17. Pt now transitioned to nasal canula. Pt afebrile, though WBC is trending up at 16.0. Pt alert, though drowsy and agreeable to SLP evaluation. Type of Study: Bedside Swallow Evaluation Previous Swallow Assessment: none in chart Diet Prior to this Study: NPO Temperature Spikes Noted: No (WBC 16.0 trending up) Respiratory Status: Nasal cannula History of Recent Intubation: Yes Length of Intubations (days): 4 days Date extubated: 11/24/21 Behavior/Cognition: Confused;Lethargic/Drowsy;Distractible Oral Cavity Assessment: Within Functional Limits Oral Care Completed by SLP: Yes Oral Cavity - Dentition: Missing dentition;Adequate natural dentition Vision: Functional for self-feeding Self-Feeding Abilities: Needs assist Patient Positioning: Upright in bed Baseline Vocal Quality: Hoarse;Breathy Volitional Cough: Weak Volitional Swallow: Unable to elicit    Oral/Motor/Sensory Function Overall Oral Motor/Sensory Function: Within functional limits   Ice Chips Ice chips: Impaired Presentation: Spoon Oral Phase Impairments: Poor awareness of bolus;Impaired mastication Oral Phase Functional Implications: Prolonged oral transit Pharyngeal Phase Impairments: Cough - Immediate   Thin Liquid Thin Liquid: Impaired Presentation: Cup;Self Fed;Spoon Oral Phase Impairments: Poor awareness of bolus Oral Phase Functional Implications: Prolonged oral transit Pharyngeal  Phase Impairments: Cough - Immediate;Wet Vocal Quality    Nectar Thick Nectar Thick Liquid: Not tested  Honey Thick Honey Thick Liquid: Not tested   Puree Puree: Not tested   Solid   [place] Javonnie Illescas Clapp MS CCC-SLP Solid: Not tested      [place] J Clapp 11/25/2021,11:10 AM

## 2021-11-25 NOTE — Progress Notes (Signed)
Acute Hypoxic Respiratory Failure secondary to suspected flash pulmonary edema in the setting of HFrEF & ESRD with anasarca  Patient has been receiving iHD daily, last session 12 hours prior with removal of 3 L. She is 9 L negative since admission. Was called bedside by nursing after patient became dyspneic after using the bedside commode. Upon bedside assessment patient tachypneic, tachycardic & anxious on BIPAP for support. Breath sounds auscultated coarse bilaterally with expiratory wheezing - Continue BIPAP as tolerated, wean FiO2 PRN   - 160 mg of lasix ordered, 0.5 of ativan - STAT CXR - STAT ABG  HIGH RISK for INTUBATION   Nichole Wiley, AGACNP-BC Acute Care Nurse Practitioner [facility]   [phone] / [phone] Please see Amion for pager details.

## 2021-11-25 NOTE — Progress Notes (Signed)
[place], [place] 11/25/21  Subjective:   Hospital day # 5  Patient known to our practice from outpatient dialysis.  Last few weeks she had purulent exit site infection with MRSA.  She was treated with intraperitoneal vancomycin and advised to get the catheter removed from her surgeon, which patient missed scheduled appt. .  Patient seen and evaluated at bedside in ICU during dialysis Remains on 5L HFNC Remains NPO, awaiting swallow evaluation Edema improving   HEMODIALYSIS FLOWSHEET:  Blood Flow Rate (mL/min): 350 mL/min Arterial Pressure (mmHg): -120 mmHg Venous Pressure (mmHg): 180 mmHg TMP (mmHg): 11 mmHg Ultrafiltration Rate (mL/min): 1114 mL/min Dialysate Flow Rate (mL/min): 300 ml/min Dialysis Fluid Bolus: Normal Saline Bolus Amount (mL): 300 mL   Objective:  Vital signs in last 24 hours:  Temp:  [96.3 F (35.7 C)-98.1 F (36.7 C)] 97.5 F (36.4 C) (09/18 1030) Pulse Rate:  [96-109] 109 (09/18 1030) Resp:  [10-28] 13 (09/18 1030) BP: (132-164)/(72-125) 156/99 (09/18 1030) SpO2:  [92 %-100 %] 99 % (09/18 1030) FiO2 (%):  [100 %] 100 % (09/18 1030) Weight:  [87.5 kg-90 kg] 87.5 kg (09/18 0745)  Weight change:  Filed Weights   11/22/21 1705 11/24/21 1545 11/25/21 0745  Weight: 94.4 kg 90 kg 87.5 kg    Intake/Output:    Intake/Output Summary (Last 24 hours) at 11/25/2021 1042 Last data filed at 11/25/2021 0800 Gross per 24 hour  Intake 171 ml  Output 2650 ml  Net -2479 ml      Physical Exam: General: ill-appearing  HEENT Normocephalic, dry oral membranes  Pulm/lungs Basilar rhonchi Wink[place] O2, productive cough  CVS/Heart Regular rhythm  Abdomen:  Nondistended, tender  Extremities: 2+ pitting edema  Neurologic: Alert/oriented, able to answer simple questions  Skin: No acute rashes  Access: Right IJ temporary catheter       Basic Metabolic Panel:  Recent Labs  Lab 11/21/21 0228 11/21/21 0413 11/22/21 0530  11/22/21 1857 11/23/21 0413 11/23/21 1640 11/24/21 0421 11/24/21 1600 11/25/21 0427  NA  --    < > 133*   < > 131* 130* 133* 133* 137  K  --    < > 4.1   < > 3.7 3.7 3.6 3.8 3.8  CL  --    < > 98   < > 96* 96* 95* 97* 97*  CO2  --    < > 22   < > '24 24 27 22 25  '$ GLUCOSE  --    < > 107*   < > 101* 130* 143* 156* 111*  BUN  --    < > 51*   < > 30* 19 24* 28* 22*  CREATININE  --    < > 9.88*   < > 6.20* 4.49* 5.15* 5.43* 4.39*  CALCIUM  --    < > 7.6*   < > 7.0* 7.2* 7.3* 7.3* 7.7*  MG 2.0  --  1.8  --  1.7  --  2.1  --  2.0  PHOS  --    < > 6.1*   < > 4.1 3.6 4.2 5.5* 5.1*   < > = values in this interval not displayed.      CBC: Recent Labs  Lab 11/20/21 1735 11/21/21 0228 11/21/21 1149 11/21/21 2347 11/22/21 0530 11/23/21 0413 11/24/21 0421 11/25/21 0427  WBC 15.1* 12.3*  --   --  8.8 8.7 13.9* 16.0*  NEUTROABS 10.3* 10.4*  --   --   --   --   --   --  HGB 9.3* 7.1*   < > 7.0* 7.0* 8.9* 9.2* 9.3*  HCT 30.7* 22.6*   < > 22.9* 22.8* 28.1* 29.2* 29.4*  MCV 93.6 88.6  --   --  91.9 90.1 90.4 90.2  PLT 357 239  --   --  184 155 160 183   < > = values in this interval not displayed.       Lab Results  Component Value Date   HEPBSAG NON REACTIVE 11/21/2021   HEPBIGM NON REACTIVE 10/01/2021      Microbiology:  Recent Results (from the past 240 hour(s))  Blood Culture (routine x 2)     Status: None (Preliminary result)   Collection Time: 11/20/21  5:54 PM   Specimen: BLOOD  Result Value Ref Range Status   Specimen Description BLOOD LEFT ANTECUBITAL  Final   Special Requests   Final    BOTTLES DRAWN AEROBIC AND ANAEROBIC Blood Culture adequate volume   Culture   Final    NO GROWTH 4 DAYS Performed at [facility], [address] [place], [place] [address]    Report Status PENDING  Incomplete  SARS Coronavirus 2 by RT PCR (hospital order, performed in [place] hospital lab) *cepheid single result test* Anterior Nasal Swab     Status: None    Collection Time: 11/20/21  6:14 PM   Specimen: Anterior Nasal Swab  Result Value Ref Range Status   SARS Coronavirus 2 by RT PCR NEGATIVE NEGATIVE Final    Comment: (NOTE) SARS-CoV-2 target nucleic acids are NOT DETECTED.  The SARS-CoV-2 RNA is generally detectable in upper and lower respiratory specimens during the acute phase of infection. The lowest concentration of SARS-CoV-2 viral copies this assay can detect is 250 copies / mL. A negative result does not preclude SARS-CoV-2 infection and should not be used as the sole basis for treatment or other patient management decisions.  A negative result may occur with improper specimen collection / handling, submission of specimen other than nasopharyngeal swab, presence of viral mutation(s) within the areas targeted by this assay, and inadequate number of viral copies (<250 copies / mL). A negative result must be combined with clinical observations, patient history, and epidemiological information.  Fact Sheet for Patients:   https://www.patel.info/  Fact Sheet for Healthcare Providers: https://hall.com/  This test is not yet approved or  cleared by the [place] FDA and has been authorized for detection and/or diagnosis of SARS-CoV-2 by FDA under an Emergency Use Authorization (EUA).  This EUA will remain in effect (meaning this test can be used) for the duration of the COVID-19 declaration under Section 564(b)(1) of the Act, 21 U.S.C. section 360bbb-3(b)(1), unless the authorization is terminated or revoked sooner.  Performed at [facility], [address] [place], [place] [address]   Urine Culture     Status: None   Collection Time: 11/20/21  6:30 PM   Specimen: Urine, Random  Result Value Ref Range Status   Specimen Description   Final    URINE, RANDOM Performed at [facility], [address] [place], [place] [address]    Special Requests   Final     NONE Performed at [facility], [address], [place], [place] [address]    Culture   Final    NO GROWTH Performed at [facility], [place] [address], [place], [place] [address]    Report Status 11/22/2021 FINAL  Final  MRSA Next Gen by PCR, Nasal  Status: Abnormal   Collection Time: 11/20/21  7:50 PM   Specimen: Nasal Mucosa; Nasal Swab  Result Value Ref Range Status   MRSA by PCR Next Gen DETECTED (A) NOT DETECTED Final    Comment: RESULT CALLED TO, READ BACK BY AND VERIFIED WITH: BETH BUONO AT 2210 ON 11/20/21 BY SS (NOTE) The GeneXpert MRSA Assay (FDA approved for NASAL specimens only), is one component of a comprehensive MRSA colonization surveillance program. It is not intended to diagnose MRSA infection nor to guide or monitor treatment for MRSA infections. Test performance is not FDA approved in patients less than 28 years old. Performed at [facility], [address] [place], [place] [address]   Blood Culture (routine x 2)     Status: None (Preliminary result)   Collection Time: 11/20/21  9:55 PM   Specimen: BLOOD  Result Value Ref Range Status   Specimen Description BLOOD CENTRAL LINE  Final   Special Requests   Final    BOTTLES DRAWN AEROBIC AND ANAEROBIC Blood Culture results may not be optimal due to an inadequate volume of blood received in culture bottles   Culture   Final    NO GROWTH 4 DAYS Performed at [facility], [address] [place], [place] [address]    Report Status PENDING  Incomplete  Cath Tip Culture     Status: None   Collection Time: 11/22/21  4:45 PM   Specimen: Catheter Tip  Result Value Ref Range Status   Specimen Description CATH TIP  Final   Special Requests   Final    NONE Performed at [facility], [place] [address] [place], [place] [address]    Culture   Final    MODERATE METHICILLIN RESISTANT STAPHYLOCOCCUS AUREUS   Report Status 11/24/2021 FINAL  Final   Organism ID, Bacteria  METHICILLIN RESISTANT STAPHYLOCOCCUS AUREUS  Final      Susceptibility   Methicillin resistant staphylococcus aureus - MIC*    CIPROFLOXACIN >=8 RESISTANT Resistant     ERYTHROMYCIN <=0.25 SENSITIVE Sensitive     GENTAMICIN <=0.5 SENSITIVE Sensitive     OXACILLIN >=4 RESISTANT Resistant     TETRACYCLINE <=1 SENSITIVE Sensitive     VANCOMYCIN 1 SENSITIVE Sensitive     TRIMETH/SULFA <=10 SENSITIVE Sensitive     CLINDAMYCIN <=0.25 SENSITIVE Sensitive     RIFAMPIN <=0.5 SENSITIVE Sensitive     Inducible Clindamycin NEGATIVE Sensitive     * MODERATE METHICILLIN RESISTANT STAPHYLOCOCCUS AUREUS    Coagulation Studies: No results for input(s): "LABPROT", "INR" in the last 72 hours.   Urinalysis: No results for input(s): "COLORURINE", "LABSPEC", "PHURINE", "GLUCOSEU", "HGBUR", "BILIRUBINUR", "KETONESUR", "PROTEINUR", "UROBILINOGEN", "NITRITE", "LEUKOCYTESUR" in the last 72 hours.  Invalid input(s): "APPERANCEUR"     Imaging: DG Chest Port 1 View  Result Date: 11/24/2021 CLINICAL DATA:  Short of breath post thoracentesis EXAM: PORTABLE CHEST 1 VIEW COMPARISON:  Chest 11/24/2021 FINDINGS: No pneumothorax post thoracentesis. Bilateral airspace disease compatible with pulmonary edema with mild progression. Bibasilar airspace disease left greater than right unchanged. Small bilateral effusions unchanged. Central venous catheter tip at the cavoatrial junction unchanged. IMPRESSION: No pneumothorax post thoracentesis. Diffuse bilateral airspace disease compatible with pulmonary edema. Mild progression. Electronically Signed   By: Franchot Gallo M.D.   On: 11/24/2021 13:33   DG Chest Port 1 View  Result Date: 11/24/2021 CLINICAL DATA:  Chest pain, shortness of breath EXAM: PORTABLE CHEST 1 VIEW COMPARISON:  Previous studies including the examination of 11/23/2021 FINDINGS: There  is interval removal of endotracheal tube and NG tube. Tip of right IJ central venous catheter is seen at the junction of  superior vena cava and right atrium. Central pulmonary vessels are prominent. Increased interstitial markings are seen in both lungs. There is small to moderate left pleural effusion. Haziness in the right lower lung field may suggest layering of small effusion. There is no pneumothorax. IMPRESSION: Cardiomegaly. CHF. Pulmonary edema. Bilateral pleural effusions, more so on the left side. Electronically Signed   By: Elmer Picker M.D.   On: 11/24/2021 11:47     Medications:    sodium chloride     sodium chloride Stopped (11/20/21 2201)   dextrose Stopped (11/23/21 2135)    Chlorhexidine Gluconate Cloth  6 each Topical Q0600   epoetin (EPOGEN/PROCRIT) injection  4,000 Units Intravenous Q T,Th,Sa-HD   heparin injection (subcutaneous)  5,000 Units Subcutaneous Q8H   linezolid  600 mg Oral Q12H   multivitamin  1 tablet Per Tube QHS   multivitamin  15 mL Per Tube Daily   mupirocin ointment  1 Application Nasal BID   pantoprazole (PROTONIX) IV  40 mg Intravenous Q24H   sodium chloride flush  3 mL Intravenous Q12H   sodium chloride, acetaminophen, docusate sodium, ondansetron (ZOFRAN) IV, mouth rinse, polyethylene glycol, sodium chloride flush  Assessment/ Plan:  41 y.o. female with end-stage renal disease on peritoneal dialysis chronic pain syndrome, history of diabetes, now diet controlled, hypertension, hyperlipidemia    admitted on 11/20/2021 for Cardiac arrest [facility]) [I46.9]  #End-stage renal disease with generalized edema Peritoneal dialysis with severe MRSA exit site infection.  Received dialysis yesterday, UF 2L achieved. Will receive additional treatment today for fluid removal. Will consult vascular surgery for permcath placement.  Renal navigator aware of patient and is currently seeking outpatient dialysis clinic.     #MRSA exit site infection (11/07/21) PD catheter removed on 11/22/21.  Receiving IV vancomycin antibiotics.  #Anemia of chronic kidney disease Lab Results   Component Value Date   HGB 9.3 (L) 11/25/2021    Received blood transfusion this admission. Continue low-dose EPO with dialysis treatments.  Secondary hyperparathyroidism of renal origin Lab Results  Component Value Date   CALCIUM 7.7 (L) 11/25/2021   PHOS 5.1 (H) 11/25/2021   Patient with hypocalcemia and hyperphosphatemia. Calcium slowly improving.   Pleural effusion, bilateral.  Seen on chest x-ray.  Thoracentesis performed at bedside on 11/24/21 with yield 900 cc.   LOS: 5 Fredda Clarida 9/18/202310:42 AM  [facility] [place], [place]

## 2021-11-26 ENCOUNTER — Inpatient Hospital Stay: Payer: Medicaid Other

## 2021-11-26 ENCOUNTER — Encounter: Payer: Self-pay | Admitting: Anesthesiology

## 2021-11-26 ENCOUNTER — Encounter
Admission: EM | Discharge status: Against Medical Advice | Payer: Self-pay | Source: Home / Self Care | Attending: Internal Medicine

## 2021-11-26 DIAGNOSIS — I469 Cardiac arrest, cause unspecified: Secondary | ICD-10-CM | POA: Diagnosis not present

## 2021-11-26 DIAGNOSIS — Z95828 Presence of other vascular implants and grafts: Secondary | ICD-10-CM

## 2021-11-26 DIAGNOSIS — F17210 Nicotine dependence, cigarettes, uncomplicated: Secondary | ICD-10-CM

## 2021-11-26 DIAGNOSIS — I5021 Acute systolic (congestive) heart failure: Secondary | ICD-10-CM

## 2021-11-26 DIAGNOSIS — N186 End stage renal disease: Secondary | ICD-10-CM | POA: Diagnosis not present

## 2021-11-26 DIAGNOSIS — N179 Acute kidney failure, unspecified: Secondary | ICD-10-CM | POA: Diagnosis not present

## 2021-11-26 DIAGNOSIS — Z992 Dependence on renal dialysis: Secondary | ICD-10-CM | POA: Diagnosis not present

## 2021-11-26 DIAGNOSIS — I214 Non-ST elevation (NSTEMI) myocardial infarction: Secondary | ICD-10-CM | POA: Diagnosis not present

## 2021-11-26 HISTORY — PX: DIALYSIS/PERMA CATHETER INSERTION: CATH118288

## 2021-11-26 LAB — LIPID PANEL
Cholesterol: 321 mg/dL — ABNORMAL HIGH (ref 0–200)
HDL: 50 mg/dL (ref >40)
LDL Cholesterol: 239 mg/dL — ABNORMAL HIGH (ref 0–99)
Total CHOL/HDL Ratio: 6.4 RATIO
Triglycerides: 160 mg/dL — ABNORMAL HIGH (ref <150)
VLDL: 32 mg/dL (ref 0–40)

## 2021-11-26 LAB — CYTOLOGY - NON PAP

## 2021-11-26 LAB — TRIGLYCERIDES, BODY FLUIDS: Triglycerides, Fluid: 16 mg/dL

## 2021-11-26 LAB — GLUCOSE, CAPILLARY
Glucose-Capillary: 134 mg/dL — ABNORMAL HIGH (ref 70–99)
Glucose-Capillary: 139 mg/dL — ABNORMAL HIGH (ref 70–99)
Glucose-Capillary: 141 mg/dL — ABNORMAL HIGH (ref 70–99)
Glucose-Capillary: 160 mg/dL — ABNORMAL HIGH (ref 70–99)
Glucose-Capillary: 178 mg/dL — ABNORMAL HIGH (ref 70–99)

## 2021-11-26 LAB — CBC
HCT: 32.4 % — ABNORMAL LOW (ref 36.0–46.0)
Hemoglobin: 9.9 g/dL — ABNORMAL LOW (ref 12.0–15.0)
MCH: 28.4 pg (ref 26.0–34.0)
MCHC: 30.6 g/dL (ref 30.0–36.0)
MCV: 92.8 fL (ref 80.0–100.0)
Platelets: 228 10*3/uL (ref 150–400)
RBC: 3.49 MIL/uL — ABNORMAL LOW (ref 3.87–5.11)
RDW: 14.8 % (ref 11.5–15.5)
WBC: 15.7 10*3/uL — ABNORMAL HIGH (ref 4.0–10.5)
nRBC: 0 % (ref 0.0–0.2)

## 2021-11-26 LAB — TROPONIN I (HIGH SENSITIVITY): Troponin I (High Sensitivity): 2189 ng/L (ref <18)

## 2021-11-26 LAB — RENAL FUNCTION PANEL
Albumin: 2.3 g/dL — ABNORMAL LOW (ref 3.5–5.0)
Anion gap: 17 — ABNORMAL HIGH (ref 5–15)
BUN: 20 mg/dL (ref 6–20)
CO2: 24 mmol/L (ref 22–32)
Calcium: 8.2 mg/dL — ABNORMAL LOW (ref 8.9–10.3)
Chloride: 97 mmol/L — ABNORMAL LOW (ref 98–111)
Creatinine, Ser: 3.76 mg/dL — ABNORMAL HIGH (ref 0.44–1.00)
GFR, Estimated: 15 mL/min — ABNORMAL LOW (ref >60)
Glucose, Bld: 134 mg/dL — ABNORMAL HIGH (ref 70–99)
Phosphorus: 5.4 mg/dL — ABNORMAL HIGH (ref 2.5–4.6)
Potassium: 3.7 mmol/L (ref 3.5–5.1)
Sodium: 138 mmol/L (ref 135–145)

## 2021-11-26 LAB — PROTEIN, BODY FLUID (OTHER): Total Protein, Body Fluid Other: 1.4 g/dL

## 2021-11-26 LAB — MAGNESIUM: Magnesium: 2.2 mg/dL (ref 1.7–2.4)

## 2021-11-26 LAB — BRAIN NATRIURETIC PEPTIDE: B Natriuretic Peptide: >4500 pg/mL — ABNORMAL HIGH (ref 0.0–100.0)

## 2021-11-26 SURGERY — DIALYSIS/PERMA CATHETER INSERTION
Anesthesia: Choice

## 2021-11-26 MED ORDER — LABETALOL HCL 5 MG/ML IV SOLN: 10.0000 mg | INTRAVENOUS | Status: DC | PRN

## 2021-11-26 MED ORDER — DIPHENHYDRAMINE HCL 50 MG/ML IJ SOLN
INTRAMUSCULAR | Status: AC
  Filled 2021-11-26: qty 1

## 2021-11-26 MED ORDER — FENTANYL CITRATE (PF) 100 MCG/2ML IJ SOLN
INTRAMUSCULAR | Status: DC | PRN
  Administered 2021-11-26: 25 ug via INTRAVENOUS

## 2021-11-26 MED ORDER — ACETAMINOPHEN 500 MG PO TABS: 1000.0000 mg | ORAL_TABLET | Freq: Four times a day (QID) | ORAL | Status: DC | PRN

## 2021-11-26 MED ORDER — ATORVASTATIN CALCIUM 20 MG PO TABS
40.0000 mg | ORAL_TABLET | Freq: Every day | ORAL | Status: DC
  Administered 2021-11-26 – 2021-11-29 (×3): 40 mg via ORAL
  Filled 2021-11-26 (×3): qty 2

## 2021-11-26 MED ORDER — LEVOTHYROXINE SODIUM 50 MCG PO TABS
25.0000 ug | ORAL_TABLET | Freq: Every day | ORAL | Status: DC
  Administered 2021-11-27: 25 ug via ORAL
  Filled 2021-11-26: qty 1

## 2021-11-26 MED ORDER — MIDAZOLAM HCL 2 MG/2ML IJ SOLN
INTRAMUSCULAR | Status: AC
  Filled 2021-11-26: qty 2

## 2021-11-26 MED ORDER — HEPARIN SODIUM (PORCINE) 10000 UNIT/ML IJ SOLN
INTRAMUSCULAR | Status: AC
  Filled 2021-11-26: qty 1

## 2021-11-26 MED ORDER — IOHEXOL 350 MG/ML SOLN
75.0000 mL | Freq: Once | INTRAVENOUS | Status: AC | PRN
  Administered 2021-11-26: 75 mL via INTRAVENOUS

## 2021-11-26 MED ORDER — METOPROLOL TARTRATE 25 MG PO TABS
25.0000 mg | ORAL_TABLET | Freq: Two times a day (BID) | ORAL | Status: DC
  Administered 2021-11-26 – 2021-11-27 (×3): 25 mg via ORAL
  Filled 2021-11-26 (×3): qty 1

## 2021-11-26 MED ORDER — MIDAZOLAM HCL 2 MG/2ML IJ SOLN
INTRAMUSCULAR | Status: DC | PRN
  Administered 2021-11-26: 1 mg via INTRAVENOUS

## 2021-11-26 MED ORDER — DIPHENHYDRAMINE HCL 50 MG/ML IJ SOLN
50.0000 mg | Freq: Once | INTRAMUSCULAR | Status: AC
  Administered 2021-11-26: 50 mg via INTRAVENOUS

## 2021-11-26 MED ORDER — FENTANYL CITRATE PF 50 MCG/ML IJ SOSY
PREFILLED_SYRINGE | INTRAMUSCULAR | Status: AC
  Filled 2021-11-26: qty 1

## 2021-11-26 MED ORDER — VANCOMYCIN HCL IN DEXTROSE 1-5 GM/200ML-% IV SOLN
1000.0000 mg | INTRAVENOUS | Status: AC
  Administered 2021-11-26: 1000 mg via INTRAVENOUS
  Filled 2021-11-26: qty 200

## 2021-11-26 MED ORDER — ASPIRIN 81 MG PO TBEC
81.0000 mg | DELAYED_RELEASE_TABLET | Freq: Every day | ORAL | Status: DC
  Administered 2021-11-26 – 2021-11-29 (×3): 81 mg via ORAL
  Filled 2021-11-26 (×3): qty 1

## 2021-11-26 MED ORDER — METHADONE HCL 10 MG/ML PO CONC
20.0000 mg | Freq: Every day | ORAL | Status: DC
  Administered 2021-11-26: 20 mg via ORAL
  Filled 2021-11-26 (×2): qty 5

## 2021-11-26 MED ORDER — HEPARIN (PORCINE) 25000 UT/250ML-% IV SOLN
1250.0000 [IU]/h | INTRAVENOUS | Status: DC
  Administered 2021-11-26: 1100 [IU]/h via INTRAVENOUS
  Administered 2021-11-28: 1250 [IU]/h via INTRAVENOUS
  Filled 2021-11-26 (×3): qty 250

## 2021-11-26 MED ORDER — METOPROLOL TARTRATE 25 MG PO TABS: 25.0000 mg | ORAL_TABLET | Freq: Two times a day (BID) | ORAL | Status: DC

## 2021-11-26 MED ORDER — VANCOMYCIN HCL IN DEXTROSE 1-5 GM/200ML-% IV SOLN: 1000.0000 mg | INTRAVENOUS | Status: DC

## 2021-11-26 MED ORDER — EPOETIN ALFA 4000 UNIT/ML IJ SOLN
INTRAMUSCULAR | Status: AC
  Administered 2021-11-26: 4000 [IU]
  Filled 2021-11-26: qty 1

## 2021-11-26 MED ORDER — HEPARIN SODIUM (PORCINE) 1000 UNIT/ML IJ SOLN
INTRAMUSCULAR | Status: AC
  Administered 2021-11-26: 10000 [IU]
  Filled 2021-11-26: qty 10

## 2021-11-26 MED ORDER — PANTOPRAZOLE SODIUM 40 MG PO TBEC
40.0000 mg | DELAYED_RELEASE_TABLET | Freq: Every day | ORAL | Status: DC
  Administered 2021-11-26 – 2021-11-29 (×3): 40 mg via ORAL
  Filled 2021-11-26 (×3): qty 1

## 2021-11-26 MED ORDER — HEPARIN BOLUS VIA INFUSION
4000.0000 [IU] | Freq: Once | INTRAVENOUS | Status: AC
  Administered 2021-11-26: 4000 [IU] via INTRAVENOUS
  Filled 2021-11-26: qty 4000

## 2021-11-26 SURGICAL SUPPLY — 11 items
BIOPATCH RED 1 DISK 7.0 (GAUZE/BANDAGES/DRESSINGS) IMPLANT
CATH CANNON HEMO 15FR 23CM (HEMODIALYSIS SUPPLIES) IMPLANT
DERMABOND ADVANCED .7 DNX12 (GAUZE/BANDAGES/DRESSINGS) IMPLANT
DRAPE INCISE IOBAN 66X45 STRL (DRAPES) IMPLANT
NDL ENTRY 21GA 7CM ECHOTIP (NEEDLE) IMPLANT
NEEDLE ENTRY 21GA 7CM ECHOTIP (NEEDLE) ×1 IMPLANT
PACK ANGIOGRAPHY (CUSTOM PROCEDURE TRAY) IMPLANT
SET INTRO CAPELLA COAXIAL (SET/KITS/TRAYS/PACK) IMPLANT
SHEATH PROBE COVER 6X72 (BAG) IMPLANT
SUT MNCRL AB 4-0 PS2 18 (SUTURE) IMPLANT
SUT SILK 0 FSL (SUTURE) IMPLANT

## 2021-11-26 NOTE — Interval H&P Note (Signed)
History and Physical Interval Note:  11/26/2021 2:24 PM  Nichole Wiley  has presented today for surgery, with the diagnosis of end stage renal disease.  The various methods of treatment have been discussed with the patient and family. After consideration of risks, benefits and other options for treatment, the patient has consented to  Procedure(s): DIALYSIS/PERMA CATHETER INSERTION (N/A) as a surgical intervention.  The patient's history has been reviewed, patient examined, no change in status, stable for surgery.  I have reviewed the patient's chart and labs.  Questions were answered to the patient's satisfaction.     Hortencia Pilar

## 2021-11-26 NOTE — Consult Note (Signed)
[place] [facility] Consult   Reason for Consult: History of opiate use disorder Referring Physician:  Reesa Chew Patient Identification: Nichole Wiley MRN:  [id number] Principal Diagnosis: Cardiac arrest [facility]) Diagnosis:  Principal Problem:   Cardiac arrest ([place]) Active Problems:   HTN (hypertension)   ESRD (end stage renal disease) on dialysis ([place])   Hypothyroidism   Severe sepsis ([place])   Infection due to peritoneal dialysis catheter ([place])   Toxic metabolic encephalopathy   Acute respiratory failure with hypoxia ([place])   Pleural effusion on right   Acute HFrEF (heart failure with reduced ejection fraction) ([place])   Total Time spent with patient: 30 minutes  Subjective:   [name] is a 41 y.o. female patient admitted with got sicker".  HPI: Patient seen and chart reviewed.  This is a 41 year old woman on dialysis with a history of opiate use disorder.  Prior to coming in the hospital she was going to an opiate clinic regularly and had been on as much as 120 mg of methadone orally a day but says she had self titrated herself down to 60.  She had been very sick in the intensive care unit and was not getting any methadone.  Now she is waking up and is more alert and oriented.  Patient complains of frequent loose bowel movements generalized aches and pains and a feeling that she is going through more withdrawal symptoms.  Frequent nausea.  Jittery.  Tachycardic.  Denies being depressed denies suicidal thoughts  Past Psychiatric History: Past history of opiate use disorder and depression  Risk to Self:   Risk to Others:   Prior Inpatient Therapy:   Prior Outpatient Therapy:    Past Medical History:  Past Medical History:  Diagnosis Date   Abdominal mass    Arthritis    back   Chronic kidney disease    Colon polyp    Diabetes 1.5, managed as type 1 ([place])    Drug addiction ([place])    hx (pain pills after c-sect)   GERD (gastroesophageal reflux disease)    Hypertension     Pelvic floor dysfunction    Urine incontinence     Past Surgical History:  Procedure Laterality Date   ABDOMINAL HYSTERECTOMY  2005   CAPD REMOVAL N/A 11/22/2021   Procedure: CONTINUOUS AMBULATORY PERITONEAL DIALYSIS  (CAPD) CATHETER REMOVAL;  Surgeon: Benjamine Sprague, DO;  Location: [facility];  Service: General;  Laterality: N/A;   CESAREAN SECTION  2003   COLONOSCOPY WITH PROPOFOL N/A 09/14/2015   Procedure: COLONOSCOPY WITH PROPOFOL;  Surgeon: Lucilla Lame, MD;  Location: [facility];  Service: Endoscopy;  Laterality: N/A;  Diabetic - insulin   CYSTOSCOPY W/ RETROGRADES Right 07/06/2017   Procedure: CYSTOSCOPY WITH RETROGRADE PYELOGRAM;  Surgeon: Hollice Espy, MD;  Location: [facility];  Service: Urology;  Laterality: Right;   CYSTOSCOPY W/ URETERAL STENT PLACEMENT Right 06/06/2017   Procedure: CYSTOSCOPY WITH RETROGRADE PYELOGRAM/URETERAL STENT PLACEMENT;  Surgeon: Lucas Mallow, MD;  Location: [facility];  Service: Urology;  Laterality: Right;   CYSTOSCOPY W/ URETERAL STENT PLACEMENT Right 07/06/2017   Procedure: CYSTOSCOPY WITH STENT REPLACEMENT;  Surgeon: Hollice Espy, MD;  Location: [facility];  Service: Urology;  Laterality: Right;   ESOPHAGOGASTRODUODENOSCOPY (EGD) WITH PROPOFOL N/A 06/19/2016   Procedure: ESOPHAGOGASTRODUODENOSCOPY (EGD) WITH PROPOFOL;  Surgeon: Jonathon Bellows, MD;  Location: [facility];  Service: Endoscopy;  Laterality: N/A;   LAPAROSCOPY     LYSIS OF ADHESION     POLYPECTOMY  09/14/2015  Procedure: POLYPECTOMY;  Surgeon: Lucilla Lame, MD;  Location: [facility];  Service: Endoscopy;;   RIGHT OOPHORECTOMY  2012   TEMPORARY DIALYSIS CATHETER N/A 10/01/2021   Procedure: TEMPORARY DIALYSIS CATHETER;  Surgeon: Algernon Huxley, MD;  Location: [facility];  Service: Cardiovascular;  Laterality: N/A;   TONSILLECTOMY  1987   and adenoids   TUBAL LIGATION  2004   VULVECTOMY  2011   lipoma   Family History:  Family History  Problem Relation Age of  Onset   Depression Mother    Diabetes Father    Heart disease Maternal Grandmother    Depression Maternal Grandmother    Dementia Maternal Grandmother    Diabetes Maternal Grandmother    Diabetes Paternal Grandmother    Family Psychiatric  History: See previous Social History:  Social History   Substance and Sexual Activity  Alcohol Use No     Social History   Substance and Sexual Activity  Drug Use No    Social History   Socioeconomic History   Marital status: Single    Spouse name: Not on file   Number of children: 2   Years of education: Not on file   Highest education level: Not on file  Occupational History   Not on file  Tobacco Use   Smoking status: Every Day    Packs/day: 0.50    Years: 24.00    Total pack years: 12.00    Types: Cigarettes   Smokeless tobacco: Never   Tobacco comments:    has cut down from 2 PPD  Vaping Use   Vaping Use: Never used  Substance and Sexual Activity   Alcohol use: No   Drug use: No   Sexual activity: Yes    Birth control/protection: Surgical  Other Topics Concern   Not on file  Social History Narrative   Not on file   Social Determinants of Health   Financial Resource Strain: Low Risk  (12/08/2018)   Overall Financial Resource Strain (CARDIA)    Difficulty of Paying Living Expenses: Not very hard  Food Insecurity: No Food Insecurity (11/21/2021)   Hunger Vital Sign    Worried About Running Out of Food in the Last Year: Never true    Ran Out of Food in the Last Year: Never true  Transportation Needs: No Transportation Needs (11/21/2021)   PRAPARE - Hydrologist (Medical): No    Lack of Transportation (Non-Medical): No  Physical Activity: Not on file  Stress: Not on file  Social Connections: Unknown (12/08/2018)   Social Connection and Isolation Panel [NHANES]    Frequency of Communication with Friends and Family: More than three times a week    Frequency of Social Gatherings with Friends  and Family: Not on file    Attends Religious Services: Not on file    Active Member of Clubs or Organizations: Not on file    Attends Archivist Meetings: Not on file    Marital Status: Not on file   Additional Social History:    Allergies:   Allergies  Allergen Reactions   Cephalexin Other (See Comments) and Rash    Unknown   Penicillins Rash    Has patient had a PCN reaction causing immediate rash, facial/tongue/throat swelling, SOB or lightheadedness with hypotension: No Has patient had a PCN reaction causing severe rash involving mucus membranes or skin necrosis: No Has patient had a PCN reaction that required hospitalization: No Has patient had a  PCN reaction occurring within the last 10 years: No If all of the above answers are "NO", then may proceed with Cephalosporin use.    Labs:  Results for orders placed or performed during the hospital encounter of 11/20/21 (from the past 48 hour(s))  Glucose, capillary     Status: Abnormal   Collection Time: 11/24/21  9:57 PM  Result Value Ref Range   Glucose-Capillary 117 (H) 70 - 99 mg/dL    Comment: Glucose reference range applies only to samples taken after fasting for at least 8 hours.  Glucose, capillary     Status: Abnormal   Collection Time: 11/25/21  4:13 AM  Result Value Ref Range   Glucose-Capillary 102 (H) 70 - 99 mg/dL    Comment: Glucose reference range applies only to samples taken after fasting for at least 8 hours.  Renal function panel (daily at 0500)     Status: Abnormal   Collection Time: 11/25/21  4:27 AM  Result Value Ref Range   Sodium 137 135 - 145 mmol/L   Potassium 3.8 3.5 - 5.1 mmol/L   Chloride 97 (L) 98 - 111 mmol/L   CO2 25 22 - 32 mmol/L   Glucose, Bld 111 (H) 70 - 99 mg/dL    Comment: Glucose reference range applies only to samples taken after fasting for at least 8 hours.   BUN 22 (H) 6 - 20 mg/dL   Creatinine, Ser 4.39 (H) 0.44 - 1.00 mg/dL   Calcium 7.7 (L) 8.9 - 10.3 mg/dL    Phosphorus 5.1 (H) 2.5 - 4.6 mg/dL   Albumin 1.9 (L) 3.5 - 5.0 g/dL   GFR, Estimated 12 (L) >60 mL/min    Comment: (NOTE) Calculated using the CKD-EPI Creatinine Equation (2021)    Anion gap 15 5 - 15    Comment: Performed at [facility], [address] [place], [place] [address]  Magnesium     Status: None   Collection Time: 11/25/21  4:27 AM  Result Value Ref Range   Magnesium 2.0 1.7 - 2.4 mg/dL    Comment: Performed at [facility], [address] [place], [place] [address]  CBC     Status: Abnormal   Collection Time: 11/25/21  4:27 AM  Result Value Ref Range   WBC 16.0 (H) 4.0 - 10.5 K/uL   RBC 3.26 (L) 3.87 - 5.11 MIL/uL   Hemoglobin 9.3 (L) 12.0 - 15.0 g/dL   HCT 29.4 (L) 36.0 - 46.0 %   MCV 90.2 80.0 - 100.0 fL   MCH 28.5 26.0 - 34.0 pg   MCHC 31.6 30.0 - 36.0 g/dL   RDW 14.6 11.5 - 15.5 %   Platelets 183 150 - 400 K/uL   nRBC 0.0 0.0 - 0.2 %    Comment: Performed at [facility], [address] [place], [place] [address]  Glucose, capillary     Status: Abnormal   Collection Time: 11/25/21  7:32 AM  Result Value Ref Range   Glucose-Capillary 121 (H) 70 - 99 mg/dL    Comment: Glucose reference range applies only to samples taken after fasting for at least 8 hours.  Glucose, capillary     Status: None   Collection Time: 11/25/21 11:18 AM  Result Value Ref Range   Glucose-Capillary 97 70 - 99 mg/dL    Comment: Glucose reference range applies only to samples taken after fasting for at least 8 hours.  Glucose, capillary     Status: Abnormal  Collection Time: 11/25/21  3:59 PM  Result Value Ref Range   Glucose-Capillary 123 (H) 70 - 99 mg/dL    Comment: Glucose reference range applies only to samples taken after fasting for at least 8 hours.  Glucose, capillary     Status: Abnormal   Collection Time: 11/25/21  7:13 PM  Result Value Ref Range   Glucose-Capillary 136 (H) 70 - 99 mg/dL    Comment: Glucose reference range applies only to  samples taken after fasting for at least 8 hours.  Blood gas, arterial     Status: Abnormal (Preliminary result)   Collection Time: 11/25/21 11:23 PM  Result Value Ref Range   FIO2 100 %   Delivery systems BILEVEL POSITIVE AIRWAY PRESSURE    Inspiratory PAP 10 cmH2O   Expiratory PAP 8 cmH2O   pH, Arterial 7.46 (H) 7.35 - 7.45   pCO2 arterial 31 (L) 32 - 48 mmHg   pO2, Arterial 60 (L) 83 - 108 mmHg   Bicarbonate 22.0 20.0 - 28.0 mmol/L   Acid-base deficit 0.9 0.0 - 2.0 mmol/L   O2 Saturation 93.4 %   Patient temperature 37.0    Collection site RIGHT RADIAL    Drawn by ARTERIAL DRAW     Comment: Performed at [facility], [address], [place], [place] [address]   Allens test (pass/fail) PENDING PASS  Glucose, capillary     Status: Abnormal   Collection Time: 11/25/21 11:25 PM  Result Value Ref Range   Glucose-Capillary 163 (H) 70 - 99 mg/dL    Comment: Glucose reference range applies only to samples taken after fasting for at least 8 hours.  Glucose, capillary     Status: Abnormal   Collection Time: 11/26/21  3:36 AM  Result Value Ref Range   Glucose-Capillary 139 (H) 70 - 99 mg/dL    Comment: Glucose reference range applies only to samples taken after fasting for at least 8 hours.  Renal function panel (daily at 0500)     Status: Abnormal   Collection Time: 11/26/21  5:15 AM  Result Value Ref Range   Sodium 138 135 - 145 mmol/L   Potassium 3.7 3.5 - 5.1 mmol/L   Chloride 97 (L) 98 - 111 mmol/L   CO2 24 22 - 32 mmol/L   Glucose, Bld 134 (H) 70 - 99 mg/dL    Comment: Glucose reference range applies only to samples taken after fasting for at least 8 hours.   BUN 20 6 - 20 mg/dL   Creatinine, Ser 3.76 (H) 0.44 - 1.00 mg/dL   Calcium 8.2 (L) 8.9 - 10.3 mg/dL   Phosphorus 5.4 (H) 2.5 - 4.6 mg/dL   Albumin 2.3 (L) 3.5 - 5.0 g/dL   GFR, Estimated 15 (L) >60 mL/min    Comment: (NOTE) Calculated using the CKD-EPI Creatinine Equation (2021)    Anion gap 17 (H) 5 - 15     Comment: Performed at [facility], [address] [place], [place] [address]  Magnesium     Status: None   Collection Time: 11/26/21  5:15 AM  Result Value Ref Range   Magnesium 2.2 1.7 - 2.4 mg/dL    Comment: Performed at [facility], [address] [place],  [address]  CBC     Status: Abnormal   Collection Time: 11/26/21  5:15 AM  Result Value Ref Range   WBC 15.7 (H) 4.0 - 10.5 K/uL   RBC 3.49 (L) 3.87 - 5.11 MIL/uL  Hemoglobin 9.9 (L) 12.0 - 15.0 g/dL   HCT 32.4 (L) 36.0 - 46.0 %   MCV 92.8 80.0 - 100.0 fL   MCH 28.4 26.0 - 34.0 pg   MCHC 30.6 30.0 - 36.0 g/dL   RDW 14.8 11.5 - 15.5 %   Platelets 228 150 - 400 K/uL   nRBC 0.0 0.0 - 0.2 %    Comment: Performed at [facility], [address] [place], [place] [address]  Brain natriuretic peptide     Status: Abnormal   Collection Time: 11/26/21  5:15 AM  Result Value Ref Range   B Natriuretic Peptide >4,500.0 (H) 0.0 - 100.0 pg/mL    Comment: Performed at [facility], [address] [place], [place] [address]  Glucose, capillary     Status: Abnormal   Collection Time: 11/26/21  7:51 AM  Result Value Ref Range   Glucose-Capillary 134 (H) 70 - 99 mg/dL    Comment: Glucose reference range applies only to samples taken after fasting for at least 8 hours.  Troponin I (High Sensitivity)     Status: Abnormal   Collection Time: 11/26/21  3:15 PM  Result Value Ref Range   Troponin I (High Sensitivity) 2,189 (HH) <18 ng/L    Comment: CRITICAL VALUE NOTED. VALUE IS CONSISTENT WITH PREVIOUSLY REPORTED/CALLED VALUE MU (NOTE) Elevated high sensitivity troponin I (hsTnI) values and significant  changes across serial measurements may suggest ACS but many other  chronic and acute conditions are known to elevate hsTnI results.  Refer to the "Links" section for chest pain algorithms and additional  guidance. Performed at [facility], [address] [place], [place]  [address]   Lipid panel     Status: Abnormal   Collection Time: 11/26/21  3:15 PM  Result Value Ref Range   Cholesterol 321 (H) 0 - 200 mg/dL   Triglycerides 160 (H) <150 mg/dL   HDL 50 >40 mg/dL   Total CHOL/HDL Ratio 6.4 RATIO   VLDL 32 0 - 40 mg/dL   LDL Cholesterol 239 (H) 0 - 99 mg/dL    Comment:        Total Cholesterol/HDL:CHD Risk Coronary Heart Disease Risk Table                     Men   Women  1/2 Average Risk   3.4   3.3  Average Risk       5.0   4.4  2 X Average Risk   9.6   7.1  3 X Average Risk  23.4   11.0        Use the calculated Patient Ratio above and the CHD Risk Table to determine the patient's CHD Risk.        ATP III CLASSIFICATION (LDL):  <100     mg/dL   Optimal  100-129  mg/dL   Near or Above                    Optimal  130-159  mg/dL   Borderline  160-189  mg/dL   High  >190     mg/dL   Very High Performed at [facility], [address] [place], [place] [address]   Glucose, capillary     Status: Abnormal   Collection Time: 11/26/21  4:45 PM  Result Value Ref Range   Glucose-Capillary 141 (H) 70 - 99 mg/dL    Comment: Glucose reference range applies only to samples taken  after fasting for at least 8 hours.    Current Facility-Administered Medications  Medication Dose Route Frequency Provider Last Rate Last Admin   0.9 %  sodium chloride infusion  250 mL Intravenous PRN Schnier, Dolores Lory, MD       0.9 %  sodium chloride infusion  250 mL Intravenous Continuous Schnier, Dolores Lory, MD 20 mL/hr at 11/26/21 1322 250 mL at 11/26/21 1322   acetaminophen (TYLENOL) tablet 1,000 mg  1,000 mg Oral Q6H PRN Schnier, Dolores Lory, MD       aspirin EC tablet 81 mg  81 mg Oral Daily End, Christopher, MD       atorvastatin (LIPITOR) tablet 40 mg  40 mg Oral Daily End, Christopher, MD       Chlorhexidine Gluconate Cloth 2 % PADS 6 each  6 each Topical Q0600 Schnier, Dolores Lory, MD   6 each at 11/26/21 0418   docusate sodium (COLACE) capsule 100 mg  100 mg  Oral BID PRN Schnier, Dolores Lory, MD       epoetin alfa (EPOGEN) injection 4,000 Units  4,000 Units Intravenous Q T,Th,Sa-HD Schnier, Dolores Lory, MD   4,000 Units at 11/26/21 1200   feeding supplement (ENSURE ENLIVE / ENSURE PLUS) liquid 237 mL  237 mL Oral TID BM Schnier, Dolores Lory, MD       fentaNYL (SUBLIMAZE) injection 12.5-25 mcg  12.5-25 mcg Intravenous Q2H PRN Schnier, Dolores Lory, MD   25 mcg at 11/25/21 2246   heparin 10000 UNIT/ML injection            heparin ADULT infusion 100 units/mL (25000 units/263m)  1,100 Units/hr Intravenous Continuous GDallie Piles RPH       heparin bolus via infusion 4,000 Units  4,000 Units Intravenous Once [facility]allie Piles [facility]      [START ON 11/27/2021] levothyroxine (SYNTHROID) tablet 25 mcg  25 mcg Oral Q0600 SKatha Cabal MD       linezolid (ZYVOX) tablet 600 mg  600 mg Oral Q12H Schnier, GDolores Lory MD   600 mg at 11/25/21 2143   methadone (DOLOPHINE) 10 MG/ML solution 20 mg  20 mg Oral Daily Dnyla Antonetti, JMadie Reno MD       metoprolol tartrate (LOPRESSOR) tablet 25 mg  25 mg Oral BID BLorna Dibble RPH   25 mg at 11/26/21 1621   multivitamin (RENA-VIT) tablet 1 tablet  1 tablet Per Tube QHS Schnier, GDolores Lory MD   1 tablet at 11/25/21 2142   ondansetron (ZOFRAN) injection 4 mg  4 mg Intravenous Q6H PRN Schnier, GDolores Lory MD       Oral care mouth rinse  15 mL Mouth Rinse PRN Schnier, GDolores Lory MD       pantoprazole (PROTONIX) EC tablet 40 mg  40 mg Oral Daily Schnier, GDolores Lory MD   40 mg at 11/26/21 1621   polyethylene glycol (MIRALAX / GLYCOLAX) packet 17 g  17 g Oral Daily PRN Schnier, GDolores Lory MD       sodium chloride flush (NS) 0.9 % injection 3 mL  3 mL Intravenous Q12H Schnier, GDolores Lory MD   3 mL at 11/25/21 2159   sodium chloride flush (NS) 0.9 % injection 3 mL  3 mL Intravenous PRN Schnier, GDolores Lory MD        Musculoskeletal: Strength & Muscle Tone: decreased Gait & Station: normal Patient leans:  N/A            Psychiatric Specialty Exam:  Presentation  General Appearance: No data recorded Eye Contact:No data recorded Speech:No data recorded Speech Volume:No data recorded Handedness:No data recorded  Mood and Affect  Mood:No data recorded Affect:No data recorded  Thought Process  Thought Processes:No data recorded Descriptions of Associations:No data recorded Orientation:No data recorded Thought Content:No data recorded History of Schizophrenia/Schizoaffective disorder:No data recorded Duration of Psychotic Symptoms:No data recorded Hallucinations:No data recorded Ideas of Reference:No data recorded Suicidal Thoughts:No data recorded Homicidal Thoughts:No data recorded  Sensorium  Memory:No data recorded Judgment:No data recorded Insight:No data recorded  Executive Functions  Concentration:No data recorded Attention Span:No data recorded Recall:No data recorded Fund of Knowledge:No data recorded Language:No data recorded  Psychomotor Activity  Psychomotor Activity:No data recorded  Assets  Assets:No data recorded  Sleep  Sleep:No data recorded  Physical Exam: Physical Exam Vitals and nursing note reviewed.  Constitutional:      Appearance: Normal appearance.  HENT:     Head: Normocephalic and atraumatic.     Mouth/Throat:     Pharynx: Oropharynx is clear.  Eyes:     Pupils: Pupils are equal, round, and reactive to light.  Cardiovascular:     Rate and Rhythm: Normal rate and regular rhythm.  Pulmonary:     Effort: Pulmonary effort is normal.     Breath sounds: Normal breath sounds.  Abdominal:     General: Abdomen is flat.     Palpations: Abdomen is soft.  Musculoskeletal:        General: Normal range of motion.  Skin:    General: Skin is warm and dry.  Neurological:     General: No focal deficit present.     Mental Status: She is alert. Mental status is at baseline.  Psychiatric:        Attention and Perception: Attention  normal.        Mood and Affect: Mood is anxious.        Speech: Speech normal.        Behavior: Behavior is cooperative.        Thought Content: Thought content normal.        Cognition and Memory: Cognition normal.    Review of Systems  Constitutional: Negative.   HENT: Negative.    Eyes: Negative.   Respiratory: Negative.    Cardiovascular: Negative.   Gastrointestinal:  Positive for diarrhea.  Musculoskeletal: Negative.   Skin: Negative.   Neurological: Negative.   Psychiatric/Behavioral:  The patient is nervous/anxious.    Blood pressure (!) 142/95, pulse (!) 111, temperature 99.3 F (37.4 C), resp. rate (!) 37, height '5\' 7"'$  (1.702 m), weight 80 kg, SpO2 100 %. Body mass index is 27.62 kg/m.  Treatment Plan Summary: Plan patient has never been on Suboxone in the past and intends to follow up with her methadone program.  Requests restarting methadone.  Because she has been off for a while what I will restart at a lower dose starting at 20 mg of methadone a day and then we can titrate as needed.  No need to change any other medicine  Disposition: No evidence of imminent risk to self or others at present.    Alethia Berthold, MD 11/26/2021 5:45 PM

## 2021-11-26 NOTE — Assessment & Plan Note (Signed)
TSH was elevated in May 2023 at 14.53 with low free T4 at 0.7.  Patient was apparently started on low-dose Synthroid which she was not taking for about a month. -Restart home Synthroid -Check TSH and free T4

## 2021-11-26 NOTE — Assessment & Plan Note (Addendum)
Blood pressure mildly elevated. Also had tachycardia. Amlodipine and labetalol were listed in her chart but apparently she was not taking them. -Start her on low-dose metoprolol. -Continue to monitor  -Patient will need ACE inhibitor or ARB based on acute HFrEF, defer that decision to cardiology

## 2021-11-26 NOTE — Progress Notes (Signed)
NAME:  Nichole Wiley, MRN:  [id number], DOB:  29-Nov-1980, LOS: 6 ADMISSION DATE:  11/20/2021, CONSULTATION DATE:  11/20/2021 REFERRING MD:  Dr. Jacelyn Grip, CHIEF COMPLAINT:  Cardiac Arrest   History of Present Illness:  41 y.o. female whose medical history is most notable for chronic end-stage renal disease on peritoneal dialysis, as well as chronic pain syndrome, pelvic floor dysfunction, prior history of opioid addiction who presented to the hospital on 9/11 for the evaluation of infected peritoneal dialysis cathter. Her nephrologist is [name] who sent her in for an evaluation. She was discharged home the following day and re-admitted on 9/13 to the ED and suffered a cardiac arrest. She was intubated and started on CRRT given multiple electrolyte abnormalities and worsening renal function.     She was supposed to have the peritoneal dialysis catheter removed in favor of a temporary hemodialysis catheter, but that had to be canceled because her fianc just died and she was to have the funeral in about 3 days.  Patient admitted to the ICU where she was continued on CRRT. Yesterday, her PD cathter was removed and pus was drained by surgery. She was started on HD which she tolerated well.  Pertinent  Medical History   Past Medical History:  Diagnosis Date   Abdominal mass    Arthritis    back   Chronic kidney disease    Colon polyp    Diabetes 1.5, managed as type 1 ([place])    Drug addiction ([place])    hx (pain pills after c-sect)   GERD (gastroesophageal reflux disease)    Hypertension    Pelvic floor dysfunction    Urine incontinence     Micro Data:  9/13: SARS-CoV-2 PCR>> negative 9/13: Blood culture x2>> no growth to date 9/13: Urine>> no growth 9/13: MRSA PCR>> positive 9/14: Hepatitis panel>> nonreactive 9/15: PD Catheter tip>> MRSA (resistant to Ciprofloxacin & Oxacillin) 9/17: Pleural fluid>>  Antimicrobials:  Cefepime 9/13>> 9/15 Vancomycin 9/13>>9/18 Ceftriaxone  9/15>>9/18 Linezolid 9/18>>  Significant Hospital Events: Including procedures, antibiotic start and stop dates in addition to other pertinent events   9/11 seen for  possible infected catheter in the ER 9/12 discharged home from ER 9/13 presented to ER with cardiac arrest 9/13 VASC CATH PLACED, CRRT STARTED, FIO2 AT 100% 9/14 SEVERE HYPOXIA FIO2 AT 70%. CRRT discontinued 9/15: FiO2 requirements down to 40%.  infected PD catheter removed in OR with General Surgery.  Transfuse 1 unit pRBCs for Hgb 7.0.  Cefepime deescalated to Ceftriaxone 9/16: Tolerated HD. Failed SBT x 2 during the day 9/17: SBT early in the morning successful, extubated to [place]. Thoracentesis performed 9/18: Tolerating extubation well.  Hemodynamically stable.  Tolerated HD.  Speech evaluation pending.  ABX changed to [place] 9/19 severe hypoxia and flash pulm edema, placed on biPAP  Interim History / Subjective:  Acute Hypoxic Respiratory Failure secondary to suspected flash pulmonary edema in the setting of HFrEF & ESRD with anasarca  - Continue BIPAP as tolerated, wean FiO2 PRN   - 160 mg of lasix ordered, 0.5 of ativan NEEDS HD ASAP    HIGH RISK for INTUBATION  Objective   Blood pressure (!) 152/101, pulse (!) 125, temperature 98.1 F (36.7 C), resp. rate 20, height '5\' 7"'$  (1.702 m), weight 79.8 kg, SpO2 100 %.    FiO2 (%):  [60 %-100 %] 100 %   Intake/Output Summary (Last 24 hours) at 11/26/2021 0721 Last data filed at 11/26/2021 0400 Gross per 24 hour  Intake  100 ml  Output 3650 ml  Net -3550 ml    Filed Weights   11/24/21 1545 11/25/21 0745 11/26/21 0417  Weight: 90 kg 87.5 kg 79.8 kg       Review of Systems: Gen:  +resp distress, weaning off biPAP HEENT: Denies blurred vision, double vision, ear pain, eye pain, hearing loss, nose bleeds, sore throat Cardiac:  No dizziness, chest pain or heaviness, chest tightness,edema, No JVD Resp:   + Other:  All other systems negative  BP (!) 152/101    Pulse (!) 125   Temp 98.1 F (36.7 C)   Resp 20   Ht '5\' 7"'$  (1.702 m)   Wt 79.8 kg   SpO2 100%   BMI 27.55 kg/m    Physical Examination:   General Appearance: +distress  EYES PERRLA, EOM intact.   NECK Supple, No JVD Pulmonary: +crackles CardiovascularNormal S1,S2.  No m/r/g.   Abdomen: Benign, Soft, non-tender. 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%) -Continuous cardiac monitoring -Maintain MAP >65 -HS Troponin peaked at 532 -Volume removal with HD -Echocardiogram 11/23/21: LVEF 40-45%, indeterminate diastolic parameters, RV systolic function normal Recurrent flash pulm edema Recommend cardiology consultation-assess for ischemic cardiomyopathy   Metabolic Encephalopathy  Patient is asking for methadone -CT Head negative on presentation -Avoid sedating meds as able -Once awake, will resume home Methadone -Consult Psych for depression     ESRD -continue Foley Catheter-assess need -Avoid nephrotoxic agents -Follow urine output, BMP -Ensure adequate renal perfusion, optimize oxygenation -Renal dose medications HD asap   Intake/Output Summary (Last 24 hours) at 11/26/2021 0733 Last data filed at 11/26/2021 0400 Gross per 24 hour  Intake 100 ml  Output 3650 ml  Net -3550 ml   -Will eventually need placement of Permcath ~ will defer to Nephrology regarding timing of obtaining Vascular Surgery consult    ENDO - ICU hypoglycemic\Hyperglycemia  protocol -check FSBS per protocol   GI GI PROPHYLAXIS as indicated  NUTRITIONAL STATUS DIET-->TF's as tolerated Constipation protocol as indicated   ELECTROLYTES -follow labs as needed -replace as needed -pharmacy consultation and following  ACUTE ANEMIA- TRANSFUSE AS NEEDED CONSIDER TRANSFUSION  IF HGB<7   INFECTIOUS DISEASE Severe Sepsis Peritoneal Catheter Infection MRSA -continue antibiotics as prescribed -Continue Linezolid       Best Practice (right click and "Reselect all SmartList Selections" daily)   Diet/type: NPO, speech evaluation pending DVT prophylaxis: Heparin SQ GI prophylaxis: PPI Lines: Dialysis Catheter and yes and it is still needed Foley:  Yes, and it is still needed Code Status:  full code   Labs   CBC: Recent Labs  Lab 11/20/21 1735 11/21/21 0228 11/21/21 1149 11/22/21 0530 11/23/21 0413 11/24/21 0421 11/25/21 0427 11/26/21 0515  WBC 15.1* 12.3*  --  8.8 8.7 13.9* 16.0* 15.7*  NEUTROABS 10.3* 10.4*  --   --   --   --   --   --   HGB 9.3* 7.1*   < > 7.0* 8.9* 9.2* 9.3* 9.9*  HCT 30.7* 22.6*   < >  22.8* 28.1* 29.2* 29.4* 32.4*  MCV 93.6 88.6  --  91.9 90.1 90.4 90.2 92.8  PLT 357 239  --  184 155 160 183 228   < > = values in this interval not displayed.     Basic Metabolic Panel: Recent Labs  Lab 11/22/21 0530 11/22/21 1857 11/23/21 0413 11/23/21 1640 11/24/21 0421 11/24/21 1600 11/25/21 0427 11/26/21 0515  NA 133*   < > 131* 130* 133* 133* 137 138  K 4.1   < > 3.7 3.7 3.6 3.8 3.8 3.7  CL 98   < > 96* 96* 95* 97* 97* 97*  CO2 22   < > '24 24 27 22 25 24  '$ GLUCOSE 107*   < > 101* 130* 143* 156* 111* 134*  BUN 51*   < > 30* 19 24* 28* 22* 20  CREATININE 9.88*   < > 6.20* 4.49* 5.15* 5.43* 4.39* 3.76*  CALCIUM 7.6*   < > 7.0* 7.2* 7.3* 7.3* 7.7* 8.2*  MG 1.8  --  1.7  --  2.1  --  2.0 2.2  PHOS 6.1*   < > 4.1 3.6 4.2 5.5* 5.1* 5.4*   < > = values in this interval not displayed.    GFR: Estimated Creatinine  Clearance: 21 mL/min (A) (by C-G formula based on SCr of 3.76 mg/dL (H)). Recent Labs  Lab 11/20/21 1735 11/20/21 2155 11/21/21 0228 11/21/21 1045 11/22/21 0530 11/23/21 0413 11/24/21 0421 11/25/21 0427 11/26/21 0515  PROCALCITON  --   --   --  1.49 4.54 4.32  --   --   --   WBC 15.1*  --    < >  --  8.8 8.7 13.9* 16.0* 15.7*  LATICACIDVEN 7.8* 1.0  --   --   --   --   --   --   --    < > = values in this interval not displayed.     Liver Function Tests: Recent Labs  Lab 11/20/21 1735 11/21/21 0413 11/23/21 1640 11/24/21 0421 11/24/21 1600 11/25/21 0427 11/26/21 0515  AST 43*  --   --   --   --   --   --   ALT 23  --   --   --   --   --   --   ALKPHOS 144*  --   --   --   --   --   --   BILITOT 0.3  --   --   --   --   --   --   PROT 6.3*  --   --   --   --   --   --   ALBUMIN 2.5*   < > 1.9*  2.0* 2.1* 2.0* 1.9* 2.3*   < > = values in this interval not displayed.    No results for input(s): "LIPASE", "AMYLASE" in the last 168 hours. No results for input(s): "AMMONIA" in the last 168 hours.  ABG    Component Value Date/Time   PHART 7.46 (H) 11/25/2021 2323   PCO2ART 31 (L) 11/25/2021 2323   PO2ART 60 (L) 11/25/2021 2323   HCO3 22.0 11/25/2021 2323   ACIDBASEDEF 0.9 11/25/2021 2323   O2SAT 93.4 11/25/2021 2323     Coagulation Profile: Recent Labs  Lab 11/20/21 1735  INR 1.4*     Cardiac Enzymes: No results for input(s): "CKTOTAL", "CKMB", "CKMBINDEX", "TROPONINI" in the last 168 hours.  HbA1C: Hemoglobin A1C  Date/Time Value Ref Range  Status  04/27/2021 12:00 AM 5  Final    Comment:    care everywhere   Hgb A1c MFr Bld  Date/Time Value Ref Range Status  10/01/2021 02:55 AM 4.8 4.8 - 5.6 % Final    Comment:    (NOTE) Pre diabetes:          5.7%-6.4%  Diabetes:              >6.4%  Glycemic control for   <7.0% adults with diabetes     CBG: Recent Labs  Lab 11/25/21 1118 11/25/21 1559 11/25/21 1913 11/25/21 2325 11/26/21 0336   GLUCAP 97 123* 136* 163* 139*    Allergies Allergies  Allergen Reactions   Cephalexin Other (See Comments) and Rash    Unknown   Penicillins Rash    Has patient had a PCN reaction causing immediate rash, facial/tongue/throat swelling, SOB or lightheadedness with hypotension: No Has patient had a PCN reaction causing severe rash involving mucus membranes or skin necrosis: No Has patient had a PCN reaction that required hospitalization: No Has patient had a PCN reaction occurring within the last 10 years: No If all of the above answers are "NO", then may proceed with Cephalosporin use.     Home Medications  Prior to Admission medications   Medication Sig Start Date End Date Taking? Authorizing Provider  aspirin EC 81 MG tablet Take 81 mg by mouth daily.   Yes [provider]  metolazone (ZAROXOLYN) 2.5 MG tablet Take 1 tablet (2.5 mg total) by mouth every other day. 10/29/21  Yes Jearld Fenton, NP  omeprazole (PRILOSEC) 20 MG capsule Take 1 capsule (20 mg total) by mouth daily. 08/07/21  Yes Jearld Fenton, NP  torsemide (DEMADEX) 100 MG tablet Take 1 tablet (100 mg total) by mouth daily. 11/19/21  Yes Hinda Kehr, MD  Accu-Chek Softclix Lancets lancets Use to check blood sugar daily for type 2 diabetes. E11.9 06/27/21   Jearld Fenton, NP  amLODipine (NORVASC) 10 MG tablet Take 1 tablet (10 mg total) by mouth daily. Patient not taking: Reported on 11/18/2021 10/04/21   Fritzi Mandes, MD  gentamicin cream (GARAMYCIN) 0.1 % Apply 1 Application topically daily. Patient not taking: Reported on 11/18/2021 10/04/21   Fritzi Mandes, MD  glucose blood (ACCU-CHEK GUIDE) test strip 1 each by Other route See admin instructions. 07/08/21   Jearld Fenton, NP  labetalol (NORMODYNE) 100 MG tablet Take 1 tablet (100 mg total) by mouth 3 (three) times daily. Patient not taking: Reported on 11/18/2021 10/03/21   Fritzi Mandes, MD  levothyroxine (SYNTHROID) 25 MCG tablet Take 1 tablet (25 mcg total) by  mouth daily. Patient not taking: Reported on 11/18/2021 08/08/21   Jearld Fenton, NP  methadone (DOLOPHINE) 10 MG/ML solution Take 120 mg by mouth daily. Changed by Dr Posey Pronto to get the right home dose w/o sending rx Patient not taking: Reported on 11/20/2021    [provider]  naloxone [facility]) nasal spray 4 mg/0.1 mL One spray in either nostril once for known/suspected opioid overdose. May repeat every 2-3 minutes in alternating nostril til EMS arrives 04/29/21   [provider]      DVT/GI PRX  assessed I Assessed the need for Labs I Assessed the need for Foley I Assessed the need for Central Venous Line Family Discussion when available I Assessed the need for Mobilization I made an Assessment of medications to be adjusted accordingly Safety Risk assessment completed  CASE DISCUSSED IN MULTIDISCIPLINARY  ROUNDS WITH ICU TEAM     Critical Care Time devoted to patient care services described in this note is 48 minutes.    Corrin Parker, M.D.  [facility]  Medical Director [place] Director [facility] Cardio-Pulmonary Department

## 2021-11-26 NOTE — Assessment & Plan Note (Signed)
Secondary to infected peritoneal catheter which was removed by general surgery on 11/22/2021.  Tip culture with MRSA.  Blood cultures remain negative. -Continue linezolid -ID on board

## 2021-11-26 NOTE — Op Note (Signed)
[facility]   OPERATIVE NOTE     PROCEDURE: 1. Insertion of a left IJ tunneled dialysis catheter. 2. Catheter placement and cannulation under ultrasound and fluoroscopic guidance  PRE-OPERATIVE DIAGNOSIS: end-stage renal requiring hemodialysis  POST-OPERATIVE DIAGNOSIS: same as above  SURGEON: Hortencia Pilar  ANESTHESIA: Conscious sedation was administered under my direct supervision by the interventional radiology RN. IV Versed plus fentanyl were utilized. Continuous ECG, pulse oximetry and blood pressure was monitored throughout the entire procedure.  Conscious sedation was for a total of 29 minutes.  ESTIMATED BLOOD LOSS: Minimal  FINDING(S): 1.  Tips of the catheter in the right atrium on fluoroscopy 2.  No obvious pneumothorax on fluoroscopy  SPECIMEN(S):  none  INDICATIONS:   Nichole Wiley is a 41 y.o. female  presents with end stage renal disease.  Therefore, the patient requires a tunneled dialysis catheter placement.  The patient is informed of  the risks catheter placement include but are not limited to: bleeding, infection, central venous injury, pneumothorax, possible venous stenosis, possible malpositioning in the venous system, and possible infections related to long-term catheter presence.  The patient was aware of these risks and agreed to proceed.  DESCRIPTION: The patient was taken back to Special Procedure suite.  Prior to sedation, the patient was given IV antibiotics.  After obtaining adequate sedation, the patient was prepped and draped in the standard fashion for a left IJ tunneled dialysis catheter placement.  Appropriate Time Out is called.     The left neck and chest wall are then infiltrated with 1% Lidocaine with epinepherine.  A 23 cm tip to cuff catheter is then selected, opened on the back table and prepped. Ultrasound is placed in a sterile sleeve.  Under ultrasound guidance, the left internal jugular vein is examined and is  noted to be echolucent and easily compressible indicating patency.   An image is recorded for the permanent record.  The left internal jugular vein is cannulated with the microneedle under direct ultrasound vissualization.  A Microwire followed by a micro sheath is then inserted without difficulty.   A J-wire was then advanced under fluoroscopic guidance into the inferior vena cava and the wire was secured.  Small counter incision was then made at the wire insertion site. A small pocket was fashioned with blunt dissection to allow easier passage of the cuff.  The dilator and peel-away sheath are then advanced over the wire under fluoroscopic guidance. The catheters and advanced through the peel-away sheath. It is approximated to the left chest wall after verifying the tips at the atrial caval junction and an exit site is selected.  Small incision is made at the selected exit site and the tunneling device was passed subcutaneously to the counter incision. Catheter is then pulled through the subcutaneous tunnel. The catheter is then verified for tip position under fluoroscopy, transected and the hub assembly connected.    Each port was tested by aspirating and flushing.  No resistance was noted.  Each port was then thoroughly flushed with heparinized saline.  The catheter was secured in placed with two interrupted stitches of 0 silk tied to the catheter.  The counter incision was closed with a U-stitch of 4-0 Monocryl.  The insertion site is then cleaned and sterile bandages applied including a Biopatch.  Each port was then packed with concentrated heparin (1000 Units/mL) at the manufacturer recommended volumes to each port.  Sterile caps were applied to each port.  On completion fluoroscopy, the tips  of the catheter were in the right atrium, and there was no evidence of pneumothorax.  COMPLICATIONS: None  CONDITION: Unchanged   Hortencia Pilar [facility] Office:  [phone]   11/26/2021, 2:29 PM

## 2021-11-26 NOTE — Assessment & Plan Note (Signed)
Patient initially required ventilator support as an attempt for CPR with cardiac arrest.  Recurrent flash pulmonary edema with acute HFrEF. Developed another episode this morning requiring BiPAP, improved with dialysis. -Continue to monitor -Continue with supplemental oxygen-wean as tolerated.  No baseline oxygen use.

## 2021-11-26 NOTE — Consult Note (Signed)
[facility] for Electrolyte Monitoring and Replacement   Recent Labs: Potassium (mmol/L)  Date Value  11/26/2021 3.7  01/20/2012 3.5   Magnesium (mg/dL)  Date Value  11/26/2021 2.2   Calcium (mg/dL)  Date Value  11/26/2021 8.2 (L)   Calcium, Total (mg/dL)  Date Value  01/20/2012 8.7   Albumin (g/dL)  Date Value  11/26/2021 2.3 (L)  01/20/2012 2.9 (L)   Phosphorus (mg/dL)  Date Value  11/26/2021 5.4 (H)   Sodium (mmol/L)  Date Value  11/26/2021 138  01/20/2012 127 (L)   Assessment: [name] with medical history including ESRD on PD, 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   Patient has been transitioned to HD this admission. Plan is for TTS schedule.  Goal of Therapy:  Electrolytes within normal limits Preference K ~ 4 and Mg ~ 2 given cardiac arrest  Plan:  --No electrolyte replacement indicated at this time --Electrolytes have been stable and not requiring replacement. Will discontinue electrolyte consult at this time. Defer further ordering of labs and electrolyte replacement to primary team --Pharmacy will continue to monitor peripherally  Nichole Wiley 11/26/2021 2:24 PM

## 2021-11-26 NOTE — Progress Notes (Signed)
Progress Note   Patient: Nichole Wiley SPQ:[id number] DOB: December 23, 1980 DOA: 11/20/2021     6 DOS: the patient was seen and examined on 11/26/2021   Brief hospital course: ICU Transfer:  Taken from prior notes.  41 y.o. female whose medical history is most notable for chronic end-stage renal disease on peritoneal dialysis, as well as chronic pain syndrome, pelvic floor dysfunction, prior history of opioid addiction who presented to the hospital on 9/11 for the evaluation of infected peritoneal dialysis cathter. Her nephrologist is [name] who sent her in for an evaluation. She was discharged home the following day and re-admitted on 9/13 to the ED and suffered a cardiac arrest. She was intubated and started on CRRT given multiple electrolyte abnormalities and worsening renal function.  Her plan to remove peritoneal dialysis got delayed due to death of her fianc.  She presented to ER on 9/13 with severe shortness of breath and later became unresponsive and have an acute sudden cardiac arrest requiring resuscitation and she was placed on ventilator. She was extubated on 11/25/2021.  Developed another severe hypoxia with flash pulmonary edema on 11/26/2021 requiring BiPAP which was thought to be due to volume overload. Respiratory status improved with dialysis.  She also received IV Lasix.  Patient also received 2 days of CRRT while in ICU.  Now doing regular dialysis. Her infected PD catheter was removed by general surgery in OR on 11/22/2021.  Catheter tip growing MRSA.  Blood cultures remain negative.  Echocardiogram done on 9/16 shows concern of HFrEF with EF of 40 to 45%, indeterminate diastolic parameters, normal right ventricular systolic function.  Cardiology was consulted, patient needs ischemic work-up.  TRH to resume care on 11/26/2021.   Assessment and Plan: * Cardiac arrest [facility]) Sudden cardiac arrest s/p CPR and vent support.  Extubated on 11/25/2021  Troponin elevated which  peaked at 532, can be due to demand ischemia secondary to cardiac arrest.  No obvious reason. Echocardiogram with EF of 40 to 45%, global hypokinesis and indeterminate diastolic function.  Cardiology was consulted from ICU for ischemic work-up. -Follow-up cardiology recommendations.    Acute HFrEF (heart failure with reduced ejection fraction) (HCC) In the setting of sudden cardiac arrest.  Concern of [place] as she recently had the death of her fianc.  Recurrent pulmonary edema which improved with IV Lasix along with dialysis. -Cardiology was consulted. -Follow-up cardiology recommendations -Monitor volume status closely-being managed with dialysis  Severe sepsis ([place]) Secondary to infected peritoneal catheter which was removed by general surgery on 11/22/2021.  Tip culture with MRSA.  Blood cultures remain negative. -Continue linezolid -ID on board  Infection due to peritoneal dialysis catheter ([place]) See above  Toxic metabolic encephalopathy Most likely secondary to sepsis and sudden cardiac arrest.  Resolved. -Continue to monitor  ESRD (end stage renal disease) on dialysis [facility]) Patient was initially doing peritoneal dialysis, catheter was removed on 11/22/2021 for concern of infection.  Currently getting dialysis with temporary HD catheter which is being switched to permanent catheter today. Received dialysis today with removal of 3.5 L of fluid. -Nephrology is on board -Continue with hemodialysis  Acute respiratory failure with hypoxia [facility]) Patient initially required ventilator support as an attempt for CPR with cardiac arrest.  Recurrent flash pulmonary edema with acute HFrEF. Developed another episode this morning requiring BiPAP, improved with dialysis. -Continue to monitor -Continue with supplemental oxygen-wean as tolerated.  No baseline oxygen use.  Pleural effusion on right Most likely secondary to heart failure with recurrent  pulmonary edema. S/p thoracentesis on  9/17. -Continue with supportive care  HTN (hypertension) Blood pressure mildly elevated. Also had tachycardia. Amlodipine and labetalol were listed in her chart but apparently she was not taking them. -Start her on low-dose metoprolol. -Continue to monitor  -Patient will need ACE inhibitor or ARB based on acute HFrEF, defer that decision to cardiology   Hypothyroidism TSH was elevated in May 2023 at 14.53 with low free T4 at 0.7.  Patient was apparently started on low-dose Synthroid which she was not taking for about a month. -Restart home Synthroid -Check TSH and free T4     Subjective: Patient was seen and examined during dialysis today.  She did develop some shortness of breath earlier which has been improved now.  Denies any chest pain.  No other complaints.  Physical Exam: Vitals:   11/26/21 1232 11/26/21 1256 11/26/21 1333 11/26/21 1336  BP: (!) 150/101 (!) 151/103    Pulse: (!) 125 (!) 129    Resp: 13 20    Temp: 98.4 F (36.9 C) 97.7 F (36.5 C)    TempSrc: Bladder Oral    SpO2: 100% 99% 100% 99%  Weight: 80 kg     Height:       General.  Well-developed lady, in no acute distress. Pulmonary.  Lungs clear bilaterally, normal respiratory effort. CV.  Regular rate and rhythm, no JVD, rub or murmur. Abdomen.  Soft, nontender, nondistended, BS positive. CNS.  Alert and oriented .  No focal neurologic deficit. Extremities.  No edema, no cyanosis, pulses intact and symmetrical. Psychiatry.  Judgment and insight appears normal.  Data Reviewed: Prior data reviewed.  Family Communication: Discussed with patient  Disposition: Status is: Inpatient Remains inpatient appropriate because: Severity of illness   Planned Discharge Destination: Home  DVT prophylaxis.  Subcu heparin Time spent: 55 minutes  This record has been created using Systems analyst. Errors have been sought and corrected,but may not always be located. Such creation errors do not  reflect on the standard of care.  Author: Lorella Nimrod, MD 11/26/2021 1:59 PM  For on call review [email or web address].

## 2021-11-26 NOTE — Hospital Course (Addendum)
ICU Transfer:  Taken from prior notes.  41 y.o. female whose medical history is most notable for chronic end-stage renal disease on peritoneal dialysis, as well as chronic pain syndrome, pelvic floor dysfunction, prior history of opioid addiction who presented to the hospital on 9/11 for the evaluation of infected peritoneal dialysis cathter. Her nephrologist is [name] who sent her in for an evaluation. She was discharged home the following day and re-admitted on 9/13 to the ED and suffered a cardiac arrest. She was intubated and started on CRRT given multiple electrolyte abnormalities and worsening renal function.  Her plan to remove peritoneal dialysis got delayed due to death of her fianc.  She presented to ER on 9/13 with severe shortness of breath and later became unresponsive and have an acute sudden cardiac arrest requiring resuscitation and she was placed on ventilator. She was extubated on 11/25/2021.  Developed another severe hypoxia with flash pulmonary edema on 11/26/2021 requiring BiPAP which was thought to be due to volume overload. Respiratory status improved with dialysis.  She also received IV Lasix.  Patient also received 2 days of CRRT while in ICU.  Now doing regular dialysis. Her infected PD catheter was removed by general surgery in OR on 11/22/2021.  Catheter tip growing MRSA.  Blood cultures remain negative.  Patient also received 1 unit of PRBC for anemia with appropriate response.  Echocardiogram done on 9/16 shows concern of HFrEF with EF of 40 to 45%, indeterminate diastolic parameters, normal right ventricular systolic function.  Cardiology was consulted, patient needs ischemic work-up.  TRH to resume care on 11/26/2021.  9/20: Cardiology saw her and  right and left cardiac catheterization is scheduled for tomorrow.  Repeat troponin continued to rise, currently at 4204, cardiology started her on heparin infusion.  No chest pain. Permanent tunneled catheter was placed  by vascular surgery yesterday.  Psych was consulted for concern of methadone withdrawal, apparently she was taking quite high dose at [facility].  This started her on a low-dose of 20 mg daily, patient with prolonged QTc and history of recent cardiac arrest, also on dialysis. Hemoglobin with 1 unit drop to 8.8 today.  Leukocytosis resolved.  9/21: Patient remained hemodynamically stable, hemoglobin improved to 9.4, troponin trending up after decreasing.  No chest pain.  Going for right and left cardiac catheterization later today.  9/22: Right and left cardiac catheterization shows moderate to severe three-vessel diffuse coronary artery disease without reasonable PCI targets.  Left heart and pulmonary artery pressures were severely elevated.  Cardiac surgery was consulted to discuss CABG which should be pursued in the future as outpatient.  Patient need optimization of fluid removal for her heart failure which should be obtained with dialysis. Labs seems stable except mild hypokalemia with potassium of 3.1.  Patient seems stable with no chest pain, completed heparin infusion and wants to be discharged but there is no outpatient dialysis spot available for her routine dialysis tomorrow.  Patient is pretty volume up and cardiology would like diuresed well which is being managed with dialysis in her. Patient can be discharged home after getting dialysis tomorrow morning so she can resume her routine and close outpatient cardiology follow-up.

## 2021-11-26 NOTE — Progress Notes (Signed)
Peritoneal dialysis patient transitioning to hemodialysis. New chair time: [facility] 11:15am.

## 2021-11-26 NOTE — Assessment & Plan Note (Signed)
See above

## 2021-11-26 NOTE — Assessment & Plan Note (Signed)
Sudden cardiac arrest s/p CPR and vent support.  Extubated on 11/25/2021  Troponin elevated which peaked at 532, can be due to demand ischemia secondary to cardiac arrest.  No obvious reason. Echocardiogram with EF of 40 to 45%, global hypokinesis and indeterminate diastolic function.  Cardiology was consulted from ICU for ischemic work-up. -Follow-up cardiology recommendations.

## 2021-11-26 NOTE — Consult Note (Signed)
Cardiology Consultation   Patient ID: Nichole Wiley MRN: [id number]; DOB: [birth year]  Admit date: 11/20/2021 Date of Consult: 11/26/2021  PCP:  [facility]   [place] Providers Cardiologist:  None      New to [facility] consult done by Dr. Saunders Revel  Patient Profile:   Nichole Wiley is a 41 y.o. female with a hx of chronic kidney disease on peritoneal dialysis, chronic pain syndrome, pelvic floor dysfunction, prior history of opioid addiction, who is being seen 11/26/2021 for the evaluation of elevated troponins s/p cardiac arrest at the request of Dr. Mortimer Fries.  History of Present Illness:   Nichole Wiley is a 41 year old female with a history of chronic kidney disease on peritoneal dialysis with recently found infection, chronic pain syndrome, pelvic floor dysfunction, prior history of opioid addiction.  She presented to the [facility] emergency department on 11/18/2021 with complaints of gradual worsening of her extremity swelling.  She had noted that it been there for few weeks but is gotten severe.  She said that her legs have felt tight though feeling they were going to bust.  She has swelling throughout her body including her abdomen and her arms as well.  She was not having any increased work of breathing at that time.  She had been seen in the emergency department several weeks ago and was given an extra dose of Lasix at that time.  She continues to follow with [name] who is subsequently placed her on torsemide but she had stated to the ER provider that the medication was doing nothing for the fluid.  There was concern at that time for the possibility of a catheter infection and that she had just completed an extensive course of antibiotics.  She was supposed to have her peritoneal dialysis catheter removed in favor of a temporary hemodialysis catheter but unfortunately that procedure had to be canceled as she had had a death in her family.  She was discharged from the emergency  department on the next day.  She presented back to the [facility] emergency department on 11/20/2021 and arrived via EMS in respiratory distress and then subsequently had a witnessed cardiac arrest in the emergency department.  She required respiratory support with bag-valve-mask on arrival to her room in the emergency department.  She then subsequently lost her pulse shortly after being roomed.  ACLS was immediately brought over with high-quality CPR she was given epinephrine, calcium chloride, and was intubated and obtained ROSC.  Initial vitals: Blood pressure 111/69, pulse 116, respirations 16, temperature 96.5, SPO2 72%  Pertinent labs: ABG pH 7.29, PO2 170, bicarb 17.8, acid base deficit 8.1, lactic acid 7.8, sodium 133, chloride 96, CO2 18, glucose 149, BUN of 70, serum creatinine of 15.66, albumin 2.5, AST of 43, alkaline phosphate 144, WBCs 15.1, hemoglobin 9.3, hematocrit 30.7, BNP greater than 4500, high-sensitivity troponins trended to [phone]  Medications administered in the emergency department: Propofol, fentanyl drip, vancomycin IVPB 1500 mg, 2 mg Versed, 1 mg of epinephrine, 2 g cefepime, Lasix 40 mg IVP  From that point she was admitted to the ICU where she was continued on CRRT given the multiple electrolyte abnormalities and worsening renal function.  On 9/14 she had severe hypoxia with FiO2 at 70% and CRRT was discontinued.  9/15 FiO2 requirements were down to 40% and the infected PD catheter was removed in the OR by general surgery, at that time she was transfused 1 units of packed RBCs for hemoglobin of 7.0 and  had antibiotic de-escalation started.  On 9/16 she tolerated hemodialysis but failed spontaneous breathing trials x2 during the day.  On 9/17 she had spontaneous breathing trials early in the morning that were successful and she was extubated to nasal cannula.  She also had a thoracentesis that was performed.  9/18 she was hemodynamically stable and tolerated  hemodialysis.  9/19 she developed severe hypoxia with flash pulmonary edema and had to be placed on BiPAP, she did undergo hemodialysis and expressed some anxiety with some shortness of breath on 4 L nasal cannula even though oxygen saturations were 100%   Past Medical History:  Diagnosis Date   Abdominal mass    Arthritis    back   Chronic kidney disease    Colon polyp    Diabetes 1.5, managed as type 1 ([place])    Drug addiction ([place])    hx (pain pills after c-sect)   GERD (gastroesophageal reflux disease)    Hypertension    Pelvic floor dysfunction    Urine incontinence     Past Surgical History:  Procedure Laterality Date   ABDOMINAL HYSTERECTOMY  2005   CAPD REMOVAL N/A 11/22/2021   Procedure: CONTINUOUS AMBULATORY PERITONEAL DIALYSIS  (CAPD) CATHETER REMOVAL;  Surgeon: Benjamine Sprague, DO;  Location: [facility] ORS;  Service: General;  Laterality: N/A;   CESAREAN SECTION  2003   COLONOSCOPY WITH PROPOFOL N/A 09/14/2015   Procedure: COLONOSCOPY WITH PROPOFOL;  Surgeon: Lucilla Lame, MD;  Location: [place];  Service: Endoscopy;  Laterality: N/A;  Diabetic - insulin   CYSTOSCOPY W/ RETROGRADES Right 07/06/2017   Procedure: CYSTOSCOPY WITH RETROGRADE PYELOGRAM;  Surgeon: Hollice Espy, MD;  Location: [facility] OR[facility];  Service: Urology;  Laterality: Right;   CYSTOSCOPY W/ URETERAL STENT PLACEMENT Right 06/06/2017   Procedure: CYSTOSCOPY WITH RETROGRADE PYELOGRAM/URETERAL STENT PLACEMENT;  Surgeon: Lucas Mallow, MD;  Location: [facility] OR[facility];  Service: Urology;  Laterality: Right;   CYSTOSCOPY W/ URETERAL STENT PLACEMENT Right 07/06/2017   Procedure: CYSTOSCOPY WITH STENT REPLACEMENT;  Surgeon: Hollice Espy, MD;  Location: [facility];  Service: Urology;  Laterality: Right;   ESOPHAGOGASTRODUODENOSCOPY (EGD) WITH PROPOFOL N/A 06/19/2016   Procedure: ESOPHAGOGASTRODUODENOSCOPY (EGD) WITH PROPOFOL;  Surgeon: Jonathon Bellows, MD;  Location: [facility];  Service: Endoscopy;  Laterality: N/A;    LAPAROSCOPY     LYSIS OF ADHESION     POLYPECTOMY  09/14/2015   Procedure: POLYPECTOMY;  Surgeon: Lucilla Lame, MD;  Location: [place];  Service: Endoscopy;;   RIGHT OOPHORECTOMY  2012   TEMPORARY DIALYSIS CATHETER N/A 10/01/2021   Procedure: TEMPORARY DIALYSIS CATHETER;  Surgeon: Algernon Huxley, MD;  Location: [place] [facility];  Service: Cardiovascular;  Laterality: N/A;   TONSILLECTOMY  1987   and adenoids   TUBAL LIGATION  2004   VULVECTOMY  2011   lipoma     Home Medications:  Prior to Admission medications   Medication Sig Start Date End Date Taking? Authorizing Provider  aspirin EC 81 MG tablet Take 81 mg by mouth daily.   Yes [provider]  metolazone (ZAROXOLYN) 2.5 MG tablet Take 1 tablet (2.5 mg total) by mouth every other day. 10/29/21  Yes Jearld Fenton, NP  omeprazole (PRILOSEC) 20 MG capsule Take 1 capsule (20 mg total) by mouth daily. 08/07/21  Yes Jearld Fenton, NP  torsemide (DEMADEX) 100 MG tablet Take 1 tablet (100 mg total) by mouth daily. 11/19/21  Yes Hinda Kehr, MD  Accu-Chek Softclix Lancets lancets Use to check blood  sugar daily for type 2 diabetes. E11.9 06/27/21   Jearld Fenton, NP  amLODipine (NORVASC) 10 MG tablet Take 1 tablet (10 mg total) by mouth daily. Patient not taking: Reported on 11/18/2021 10/04/21   Fritzi Mandes, MD  gentamicin cream (GARAMYCIN) 0.1 % Apply 1 Application topically daily. Patient not taking: Reported on 11/18/2021 10/04/21   Fritzi Mandes, MD  glucose blood (ACCU-CHEK GUIDE) test strip 1 each by Other route See admin instructions. 07/08/21   Jearld Fenton, NP  labetalol (NORMODYNE) 100 MG tablet Take 1 tablet (100 mg total) by mouth 3 (three) times daily. Patient not taking: Reported on 11/18/2021 10/03/21   Fritzi Mandes, MD  levothyroxine (SYNTHROID) 25 MCG tablet Take 1 tablet (25 mcg total) by mouth daily. Patient not taking: Reported on 11/18/2021 08/08/21   Jearld Fenton, NP  methadone (DOLOPHINE) 10 MG/ML  solution Take 120 mg by mouth daily. Changed by Dr Posey Pronto to get the right home dose w/o sending rx Patient not taking: Reported on 11/20/2021    [provider]  naloxone [facility]) nasal spray 4 mg/0.1 mL One spray in either nostril once for known/suspected opioid overdose. May repeat every 2-3 minutes in alternating nostril til EMS arrives 04/29/21   [provider]    Inpatient Medications: Scheduled Meds:  Chlorhexidine Gluconate Cloth  6 each Topical Q0600   epoetin (EPOGEN/PROCRIT) injection  4,000 Units Intravenous Q T,Th,Sa-HD   feeding supplement  237 mL Oral TID BM   heparin       heparin injection (subcutaneous)  5,000 Units Subcutaneous Q8H   [START ON 11/27/2021] levothyroxine  25 mcg Oral Q0600   linezolid  600 mg Oral Q12H   metoprolol tartrate  25 mg Oral BID   multivitamin  1 tablet Per Tube QHS   pantoprazole  40 mg Oral Daily   sodium chloride flush  3 mL Intravenous Q12H   Continuous Infusions:  sodium chloride     sodium chloride 250 mL (11/26/21 1322)   PRN Meds: sodium chloride, acetaminophen, docusate sodium, fentaNYL (SUBLIMAZE) injection, heparin, ondansetron (ZOFRAN) IV, mouth rinse, polyethylene glycol, sodium chloride flush  Allergies:    Allergies  Allergen Reactions   Cephalexin Other (See Comments) and Rash    Unknown   Penicillins Rash    Has patient had a PCN reaction causing immediate rash, facial/tongue/throat swelling, SOB or lightheadedness with hypotension: No Has patient had a PCN reaction causing severe rash involving mucus membranes or skin necrosis: No Has patient had a PCN reaction that required hospitalization: No Has patient had a PCN reaction occurring within the last 10 years: No If all of the above answers are "NO", then may proceed with Cephalosporin use.    Social History:   Social History   Socioeconomic History   Marital status: Single    Spouse name: Not on file   Number of children: 2   Years of  education: Not on file   Highest education level: Not on file  Occupational History   Not on file  Tobacco Use   Smoking status: Every Day    Packs/day: 0.50    Years: 24.00    Total pack years: 12.00    Types: Cigarettes   Smokeless tobacco: Never   Tobacco comments:    has cut down from 2 PPD  Vaping Use   Vaping Use: Never used  Substance and Sexual Activity   Alcohol use: No   Drug use: No   Sexual activity: Yes  Birth control/protection: Surgical  Other Topics Concern   Not on file  Social History Narrative   Not on file   Social Determinants of Health   Financial Resource Strain: Low Risk  (12/08/2018)   Overall Financial Resource Strain (CARDIA)    Difficulty of Paying Living Expenses: Not very hard  Food Insecurity: No Food Insecurity (11/21/2021)   Hunger Vital Sign    Worried About Running Out of Food in the Last Year: Never true    Ran Out of Food in the Last Year: Never true  Transportation Needs: No Transportation Needs (11/21/2021)   PRAPARE - Hydrologist (Medical): No    Lack of Transportation (Non-Medical): No  Physical Activity: Not on file  Stress: Not on file  Social Connections: Unknown (12/08/2018)   Social Connection and Isolation Panel [NHANES]    Frequency of Communication with Friends and Family: More than three times a week    Frequency of Social Gatherings with Friends and Family: Not on file    Attends Religious Services: Not on file    Active Member of Clubs or Organizations: Not on file    Attends Archivist Meetings: Not on file    Marital Status: Not on file  Intimate Partner Violence: Not At Risk (12/08/2018)   Humiliation, Afraid, Rape, and Kick questionnaire    Fear of Current or Ex-Partner: No    Emotionally Abused: No    Physically Abused: No    Sexually Abused: No    Family History:    Family History  Problem Relation Age of Onset   Depression Mother    Diabetes Father    Heart  disease Maternal Grandmother    Depression Maternal Grandmother    Dementia Maternal Grandmother    Diabetes Maternal Grandmother    Diabetes Paternal Grandmother      ROS:  Please see the history of present illness.  Review of Systems  Constitutional:  Positive for malaise/fatigue.  Respiratory:  Positive for shortness of breath.   Cardiovascular:  Positive for leg swelling.  Neurological:  Positive for weakness.    All other ROS reviewed and negative.     Physical Exam/Data:   Vitals:   11/26/21 1430 11/26/21 1452 11/26/21 1500 11/26/21 1600  BP: (!) 153/100  (!) 145/100 (!) 150/96  Pulse: (!) 128 (!) 130 (!) 126 (!) 129  Resp: 16 (!) 21 (!) 33 (!) 32  Temp:  98.6 F (37 C) 98.6 F (37 C) 98.8 F (37.1 C)  TempSrc:   Bladder   SpO2: 100% 100% 100% 100%  Weight:      Height:        Intake/Output Summary (Last 24 hours) at 11/26/2021 1610 Last data filed at 11/26/2021 1500 Gross per 24 hour  Intake 200 ml  Output 3850 ml  Net -3650 ml      11/26/2021   12:32 PM 11/26/2021    4:17 AM 11/25/2021    7:45 AM  Last 3 Weights  Weight (lbs) 176 lb 5.9 oz 175 lb 14.8 oz 192 lb 14.4 oz  Weight (kg) 80 kg 79.8 kg 87.5 kg     Body mass index is 27.62 kg/m.  General:  Well nourished, well developed, in no acute distress HEENT: normal Neck: no JVD Vascular: No carotid bruits; Distal pulses 2+ bilaterally Cardiac:  normal S1, S2; RRR; tachycardiac, no murmur  Lungs:  clear to auscultation bilaterally,respirations are unlabored at rest on 5L O2 via [place] Abd:  soft, nontender, no hepatomegaly  Ext: 1+ edema that is improving Musculoskeletal:  No deformities, BUE and BLE strength normal and equal Skin: warm and dry  Neuro:  CNs 2-12 intact, no focal abnormalities noted Psych:  Normal affect   EKG:  The EKG was personally reviewed and demonstrates:  sinus rate of 64 (noted arm leads are reversed) Telemetry:  Telemetry was personally reviewed and demonstrates:  sinus  tachycardia rate of 130  Relevant CV Studies: TTE 11/23/2021 1. Left ventricular ejection fraction, by estimation, is 40 to 45%. The  left ventricle has mildly decreased function. The left ventricle  demonstrates global hypokinesis. Left ventricular diastolic parameters are  indeterminate. Elevated left ventricular  end-diastolic pressure.   2. Right ventricular systolic function is normal. The right ventricular  size is normal.   3. Large pleural effusion in the left lateral region.   4. The mitral valve is normal in structure. Trivial mitral valve  regurgitation. No evidence of mitral stenosis.   5. The aortic valve is tricuspid. There is moderate calcification of the  aortic valve. Aortic valve regurgitation is not visualized. Aortic valve  sclerosis/calcification is present, without any evidence of aortic  stenosis. Aortic valve area, by VTI  measures 2.39 cm. Aortic valve mean gradient measures 3.0 mmHg. Aortic  valve Vmax measures 1.23 m/s.   6. The inferior vena cava is dilated in size with <50% respiratory  variability, suggesting right atrial pressure of 15 mmHg.   Laboratory Data:  High Sensitivity Troponin:   Recent Labs  Lab 11/20/21 2155 11/21/21 0228 11/21/21 0413 11/21/21 0914 11/21/21 1045  TROPONINIHS 265* 343* 433* 532* 346*     Chemistry Recent Labs  Lab 11/24/21 0421 11/24/21 1600 11/25/21 0427 11/26/21 0515  NA 133* 133* 137 138  K 3.6 3.8 3.8 3.7  CL 95* 97* 97* 97*  CO2 '27 22 25 24  '$ GLUCOSE 143* 156* 111* 134*  BUN 24* 28* 22* 20  CREATININE 5.15* 5.43* 4.39* 3.76*  CALCIUM 7.3* 7.3* 7.7* 8.2*  MG 2.1  --  2.0 2.2  GFRNONAA 10* 9* 12* 15*  ANIONGAP '11 14 15 '$ 17*    Recent Labs  Lab 11/20/21 1735 11/21/21 0413 11/24/21 1600 11/25/21 0427 11/26/21 0515  PROT 6.3*  --   --   --   --   ALBUMIN 2.5*   < > 2.0* 1.9* 2.3*  AST 43*  --   --   --   --   ALT 23  --   --   --   --   ALKPHOS 144*  --   --   --   --   BILITOT 0.3  --   --    --   --    < > = values in this interval not displayed.   Lipids  Recent Labs  Lab 11/22/21 0530  TRIG 139    Hematology Recent Labs  Lab 11/24/21 0421 11/25/21 0427 11/26/21 0515  WBC 13.9* 16.0* 15.7*  RBC 3.23* 3.26* 3.49*  HGB 9.2* 9.3* 9.9*  HCT 29.2* 29.4* 32.4*  MCV 90.4 90.2 92.8  MCH 28.5 28.5 28.4  MCHC 31.5 31.6 30.6  RDW 14.6 14.6 14.8  PLT 160 183 228   Thyroid No results for input(s): "TSH", "FREET4" in the last 168 hours.  BNP Recent Labs  Lab 11/26/21 0515  BNP >4,500.0*    DDimer No results for input(s): "DDIMER" in the last 168 hours.   Radiology/Studies:  PERIPHERAL VASCULAR CATHETERIZATION  Result  Date: 11/26/2021 See surgical note for result.  DG Chest Port 1 View  Result Date: 11/25/2021 CLINICAL DATA:  Shortness of breath EXAM: PORTABLE CHEST 1 VIEW COMPARISON:  11/24/2021 FINDINGS: Temporary dialysis catheter is again noted on the right and stable. Cardiac shadow is enlarged but stable. Increased central vascular congestion is noted with airspace opacity consistent with pulmonary edema. Small left effusion is noted. No bony abnormality is noted. IMPRESSION: Changes consistent with vascular congestion and pulmonary edema. The overall appearance is stable from the previous day. Electronically Signed   By: Inez Catalina M.D.   On: 11/25/2021 23:45   DG Chest Port 1 View  Result Date: 11/24/2021 CLINICAL DATA:  Short of breath post thoracentesis EXAM: PORTABLE CHEST 1 VIEW COMPARISON:  Chest 11/24/2021 FINDINGS: No pneumothorax post thoracentesis. Bilateral airspace disease compatible with pulmonary edema with mild progression. Bibasilar airspace disease left greater than right unchanged. Small bilateral effusions unchanged. Central venous catheter tip at the cavoatrial junction unchanged. IMPRESSION: No pneumothorax post thoracentesis. Diffuse bilateral airspace disease compatible with pulmonary edema. Mild progression. Electronically Signed   By:  Franchot Gallo M.D.   On: 11/24/2021 13:33   DG Chest Port 1 View  Result Date: 11/24/2021 CLINICAL DATA:  Chest pain, shortness of breath EXAM: PORTABLE CHEST 1 VIEW COMPARISON:  Previous studies including the examination of 11/23/2021 FINDINGS: There is interval removal of endotracheal tube and NG tube. Tip of right IJ central venous catheter is seen at the junction of superior vena cava and right atrium. Central pulmonary vessels are prominent. Increased interstitial markings are seen in both lungs. There is small to moderate left pleural effusion. Haziness in the right lower lung field may suggest layering of small effusion. There is no pneumothorax. IMPRESSION: Cardiomegaly. CHF. Pulmonary edema. Bilateral pleural effusions, more so on the left side. Electronically Signed   By: Elmer Picker M.D.   On: 11/24/2021 11:47   ECHOCARDIOGRAM COMPLETE  Result Date: 11/23/2021    ECHOCARDIOGRAM REPORT   Patient Name:   RANI IDLER Date of Exam: 11/23/2021 Medical Rec #:  [id number]       Height:       67.0 in Accession #:    [id number]      Weight:       208.1 lb Date of Birth:  1980-03-31       BSA:          2.057 m Patient Age:    46 years        BP:           123/66 mmHg Patient Gender: F               HR:           61 bpm. Exam Location:  [facility] Procedure: 2D Echo Indications:     Cardiac Arrest  History:         Patient has no prior history of Echocardiogram examinations.                  Arrythmias:LBBB; Risk Factors:Hypertension, Diabetes and CKD.  Sonographer:     L. Thornton-Maynard Referring Phys:  Bradly Bienenstock Diagnosing Phys: Fransico Him MD IMPRESSIONS  1. Left ventricular ejection fraction, by estimation, is 40 to 45%. The left ventricle has mildly decreased function. The left ventricle demonstrates global hypokinesis. Left ventricular diastolic parameters are indeterminate. Elevated left ventricular end-diastolic pressure.  2. Right ventricular systolic function is normal. The right  ventricular size is normal.  3. Large pleural effusion in the left lateral region.  4. The mitral valve is normal in structure. Trivial mitral valve regurgitation. No evidence of mitral stenosis.  5. The aortic valve is tricuspid. There is moderate calcification of the aortic valve. Aortic valve regurgitation is not visualized. Aortic valve sclerosis/calcification is present, without any evidence of aortic stenosis. Aortic valve area, by VTI measures 2.39 cm. Aortic valve mean gradient measures 3.0 mmHg. Aortic valve Vmax measures 1.23 m/s.  6. The inferior vena cava is dilated in size with <50% respiratory variability, suggesting right atrial pressure of 15 mmHg. FINDINGS  Left Ventricle: Left ventricular ejection fraction, by estimation, is 40 to 45%. The left ventricle has mildly decreased function. The left ventricle demonstrates global hypokinesis. The left ventricular internal cavity size was normal in size. There is  no left ventricular hypertrophy. Left ventricular diastolic parameters are indeterminate. Elevated left ventricular end-diastolic pressure. Right Ventricle: The right ventricular size is normal. No increase in right ventricular wall thickness. Right ventricular systolic function is normal. Left Atrium: Left atrial size was normal in size. Right Atrium: Right atrial size was normal in size. Pericardium: Trivial pericardial effusion is present. The pericardial effusion is circumferential. Mitral Valve: The mitral valve is normal in structure. Trivial mitral valve regurgitation. No evidence of mitral valve stenosis. Tricuspid Valve: The tricuspid valve is normal in structure. Tricuspid valve regurgitation is trivial. No evidence of tricuspid stenosis. Aortic Valve: The aortic valve is tricuspid. There is moderate calcification of the aortic valve. Aortic valve regurgitation is not visualized. Aortic valve sclerosis/calcification is present, without any evidence of aortic stenosis. Aortic valve mean  gradient measures 3.0 mmHg. Aortic valve peak gradient measures 6.1 mmHg. Aortic valve area, by VTI measures 2.39 cm. Pulmonic Valve: The pulmonic valve was normal in structure. Pulmonic valve regurgitation is trivial. No evidence of pulmonic stenosis. Aorta: The aortic root is normal in size and structure. Venous: The inferior vena cava is dilated in size with less than 50% respiratory variability, suggesting right atrial pressure of 15 mmHg. IAS/Shunts: No atrial level shunt detected by color flow Doppler. Additional Comments: There is a large pleural effusion in the left lateral region.  LEFT VENTRICLE PLAX 2D LVIDd:         5.10 cm      Diastology LVIDs:         3.30 cm      LV e' medial:    5.98 cm/s LV PW:         1.30 cm      LV E/e' medial:  17.7 LV IVS:        1.10 cm      LV e' lateral:   7.72 cm/s LVOT diam:     2.00 cm      LV E/e' lateral: 13.7 LV SV:         58 LV SV Index:   28 LVOT Area:     3.14 cm  LV Volumes (MOD) LV vol d, MOD A2C: 119.0 ml LV vol d, MOD A4C: 115.0 ml LV vol s, MOD A2C: 70.2 ml LV vol s, MOD A4C: 61.4 ml LV SV MOD A2C:     48.8 ml LV SV MOD A4C:     115.0 ml LV SV MOD BP:      53.0 ml RIGHT VENTRICLE RV S prime:     10.90 cm/s TAPSE (M-mode): 1.9 cm LEFT ATRIUM             Index  RIGHT ATRIUM           Index LA diam:        3.40 cm 1.65 cm/m   RA Area:     12.10 cm LA Vol (A2C):   34.5 ml 16.77 ml/m  RA Volume:   24.70 ml  12.01 ml/m LA Vol (A4C):   49.8 ml 24.21 ml/m LA Biplane Vol: 42.5 ml 20.66 ml/m  AORTIC VALVE                    PULMONIC VALVE AV Area (Vmax):    2.35 cm     PV Vmax:       0.90 m/s AV Area (Vmean):   2.27 cm     PV Peak grad:  3.2 mmHg AV Area (VTI):     2.39 cm AV Vmax:           123.00 cm/s AV Vmean:          84.800 cm/s AV VTI:            0.243 m AV Peak Grad:      6.1 mmHg AV Mean Grad:      3.0 mmHg LVOT Vmax:         92.20 cm/s LVOT Vmean:        61.300 cm/s LVOT VTI:          0.185 m LVOT/AV VTI ratio: 0.76  AORTA Ao Root diam: 2.90  cm Ao Asc diam:  2.90 cm MITRAL VALVE MV Area (PHT): 4.49 cm     SHUNTS MV Decel Time: 169 msec     Systemic VTI:  0.18 m MV E velocity: 106.00 cm/s  Systemic Diam: 2.00 cm MV A velocity: 82.30 cm/s MV E/A ratio:  1.29 Fransico Him MD Electronically signed by Fransico Him MD Signature Date/Time: 11/23/2021/10:31:03 PM    Final    DG Chest Port 1 View  Result Date: 11/23/2021 CLINICAL DATA:  Acute respiratory failure. EXAM: PORTABLE CHEST 1 VIEW COMPARISON:  Chest x-ray dated November 20, 2021. FINDINGS: Unchanged endotracheal and enteric tubes. Unchanged right internal jugular dialysis catheter. Stable cardiomediastinal silhouette. Hazy density at both lung bases likely reflecting layering pleural effusions and atelectasis, similar to slightly improved. No pneumothorax. No acute osseous abnormality. IMPRESSION: 1. Stable to slightly improved layering bilateral pleural effusions and bibasilar atelectasis. Electronically Signed   By: Titus Dubin M.D.   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%) -currently chest pain free -Hs troponins peaked at 532 -continue cardiac monitoring -has continued to be in sinus tachycardia since yesterday with gradual trend up, multifactorial respiratory status, questionable medication withdrawal -continue metoprolol 25 mg bid -echocardiogram revealed LVEF 40-45%, global hypokinesis, left ventricular diastolic parameters are indeterminate, elevated left ventricular end-diastolic pressure, large pleural effusion in the left lateral region, trivial mitral regurgitation, moderate calcification of  the aortic valve without evidence of stenosis -recurrent episodes of flush pulmonary edema concerning for ischemic cardiomyopathy -volume removal with HD per Nephrology -R/LHC to be scheduled, will re-evaluate in the AM to determine timeline earliest tomorrow afternoon -daily weight, I&O, low sodium diet   ESRD -previously on  peritoneal dialysis with catheter infection  -catheter removed by general surgery  -temporary hemodialysis catheter placed -continued on hemodialysis -management per Nephrology  Hypertension -blood pressure 150/96 -continue metoprolol 25 mg bid, first dose this evening -vital per unit protocol   Risk Assessment/Risk Scores:     TIMI Risk Score for Unstable Angina or Non-ST Elevation MI:   The patient's TIMI risk score is 3, which indicates a 13% risk of all cause mortality, new or recurrent myocardial infarction or need for urgent revascularization in the next 14 days.  New [place] Heart Association (NYHA) Functional Class NYHA Class III        For questions or updates, please contact [place] Please consult [email or web address] for contact info under    Signed, Retha Bither, NP  11/26/2021 4:10 PM

## 2021-11-26 NOTE — Progress Notes (Signed)
Pre HD RN assessment 

## 2021-11-26 NOTE — Consult Note (Signed)
[facility] Vascular Consult Note  MRN : [id number]  Nichole Wiley is a 41 y.o. (1980/04/28) female who presents with chief complaint of needs dialysis access.  History of Present Illness: Patient is admitted to the hospital with volume overload and has developed acute worsening of her chronic renal failure.    9/13 presented to ER with severe SOB, while in ER, patient became unresponsive and had acute sudden cardiac arrest and placed on VENT.   The nephrology service has decided to initiate dialysis at this time, and a temporary dialysis catheter was placed on  10/01/2021 for immediate dialysis use.  We are now asked for a tunneled catheter.   Current Facility-Administered Medications  Medication Dose Route Frequency Provider Last Rate Last Admin   [facility] Hold] 0.9 %  sodium chloride infusion  250 mL Intravenous PRN Sakai, Isami, DO       0.9 %  sodium chloride infusion  250 mL Intravenous Continuous Sakai, Isami, DO 20 mL/hr at 11/26/21 1322 250 mL at 11/26/21 1322   [MAR Hold] acetaminophen (TYLENOL) tablet 1,000 mg  1,000 mg Oral Q6H PRN Flora Lipps, MD       [MAR Hold] Chlorhexidine Gluconate Cloth 2 % PADS 6 each  6 each Topical Q0600 Sakai, Isami, DO   6 each at 11/26/21 0418   [MAR Hold] docusate sodium (COLACE) capsule 100 mg  100 mg Oral BID PRN Lysle Pearl, Isami, DO       [MAR Hold] epoetin alfa (EPOGEN) injection 4,000 Units  4,000 Units Intravenous Q T,Th,Sa-HD Murlean Iba, MD   4,000 Units at 11/26/21 1200   [MAR Hold] feeding supplement (ENSURE ENLIVE / ENSURE PLUS) liquid 237 mL  237 mL Oral TID BM Bradly Bienenstock, NP       [MAR Hold] fentaNYL (SUBLIMAZE) injection 12.5-25 mcg  12.5-25 mcg Intravenous Q2H PRN Darel Hong D, NP   25 mcg at 11/25/21 2246   fentaNYL (SUBLIMAZE) injection    PRN Luceal Hollibaugh, Dolores Lory, MD   25 mcg at 11/26/21 1335   heparin 10000 UNIT/ML injection            [MAR Hold] heparin injection 5,000 Units  5,000 Units Subcutaneous  Q8H [name], MD   5,000 Units at 11/26/21 0546   levothyroxine (SYNTHROID) tablet 25 mcg  25 mcg Oral Daily Lorella Nimrod, MD       [MAR Hold] linezolid (ZYVOX) tablet 600 mg  600 mg Oral Q12H Beers, Shanon Brow, RPH   600 mg at 11/25/21 2143   metoprolol tartrate (LOPRESSOR) tablet 25 mg  25 mg Oral BID Lorella Nimrod, MD       midazolam (VERSED) injection    PRN Euretha Najarro, Dolores Lory, MD   1 mg at 11/26/21 1335   [MAR Hold] multivitamin (RENA-VIT) tablet 1 tablet  1 tablet Per Tube QHS Sakai, Isami, DO   1 tablet at 11/25/21 2142   [MAR Hold] ondansetron (ZOFRAN) injection 4 mg  4 mg Intravenous Q6H PRN Benjamine Sprague, DO       [facility] Hold] Oral care mouth rinse  15 mL Mouth Rinse PRN Sakai, Isami, DO       [MAR Hold] pantoprazole (PROTONIX) EC tablet 40 mg  40 mg Oral Daily Benita Gutter, [facility]       [MAR Hold] polyethylene glycol (MIRALAX / GLYCOLAX) packet 17 g  17 g Oral Daily PRN Sakai, Isami, DO       [facility] Hold] sodium chloride flush (NS) 0.9 %  injection 3 mL  3 mL Intravenous Q12H Sakai, Isami, DO   3 mL at 11/25/21 2159   [facility] Hold] sodium chloride flush (NS) 0.9 % injection 3 mL  3 mL Intravenous PRN Sakai, Isami, DO       vancomycin (VANCOCIN) IVPB 1000 mg/200 mL premix  1,000 mg Intravenous To OR Jalysa Swopes, Dolores Lory, MD 200 mL/hr at 11/26/21 1331 1,000 mg at 11/26/21 1331    Past Medical History:  Diagnosis Date   Abdominal mass    Arthritis    back   Chronic kidney disease    Colon polyp    Diabetes 1.5, managed as type 1 ([place])    Drug addiction ([place])    hx (pain pills after c-sect)   GERD (gastroesophageal reflux disease)    Hypertension    Pelvic floor dysfunction    Urine incontinence     Past Surgical History:  Procedure Laterality Date   ABDOMINAL HYSTERECTOMY  2005   CAPD REMOVAL N/A 11/22/2021   Procedure: CONTINUOUS AMBULATORY PERITONEAL DIALYSIS  (CAPD) CATHETER REMOVAL;  Surgeon: Benjamine Sprague, DO;  Location: [facility];  Service: General;  Laterality: N/A;    CESAREAN SECTION  2003   COLONOSCOPY WITH PROPOFOL N/A 09/14/2015   Procedure: COLONOSCOPY WITH PROPOFOL;  Surgeon: Lucilla Lame, MD;  Location: [place];  Service: Endoscopy;  Laterality: N/A;  Diabetic - insulin   CYSTOSCOPY W/ RETROGRADES Right 07/06/2017   Procedure: CYSTOSCOPY WITH RETROGRADE PYELOGRAM;  Surgeon: Hollice Espy, MD;  Location: [facility];  Service: Urology;  Laterality: Right;   CYSTOSCOPY W/ URETERAL STENT PLACEMENT Right 06/06/2017   Procedure: CYSTOSCOPY WITH RETROGRADE PYELOGRAM/URETERAL STENT PLACEMENT;  Surgeon: Lucas Mallow, MD;  Location: [facility];  Service: Urology;  Laterality: Right;   CYSTOSCOPY W/ URETERAL STENT PLACEMENT Right 07/06/2017   Procedure: CYSTOSCOPY WITH STENT REPLACEMENT;  Surgeon: Hollice Espy, MD;  Location: [facility];  Service: Urology;  Laterality: Right;   ESOPHAGOGASTRODUODENOSCOPY (EGD) WITH PROPOFOL N/A 06/19/2016   Procedure: ESOPHAGOGASTRODUODENOSCOPY (EGD) WITH PROPOFOL;  Surgeon: Jonathon Bellows, MD;  Location: [facility];  Service: Endoscopy;  Laterality: N/A;   LAPAROSCOPY     LYSIS OF ADHESION     POLYPECTOMY  09/14/2015   Procedure: POLYPECTOMY;  Surgeon: Lucilla Lame, MD;  Location: [place];  Service: Endoscopy;;   RIGHT OOPHORECTOMY  2012   TEMPORARY DIALYSIS CATHETER N/A 10/01/2021   Procedure: TEMPORARY DIALYSIS CATHETER;  Surgeon: Algernon Huxley, MD;  Location: [facility];  Service: Cardiovascular;  Laterality: N/A;   TONSILLECTOMY  1987   and adenoids   TUBAL LIGATION  2004   VULVECTOMY  2011   lipoma    Social History Social History   Tobacco Use   Smoking status: Every Day    Packs/day: 0.50    Years: 24.00    Total pack years: 12.00    Types: Cigarettes   Smokeless tobacco: Never   Tobacco comments:    has cut down from 2 PPD  Vaping Use   Vaping Use: Never used  Substance Use Topics   Alcohol use: No   Drug use: No    Family History Family History  Problem Relation Age of  Onset   Depression Mother    Diabetes Father    Heart disease Maternal Grandmother    Depression Maternal Grandmother    Dementia Maternal Grandmother    Diabetes Maternal Grandmother    Diabetes Paternal Grandmother     Allergies  Allergen Reactions  Cephalexin Other (See Comments) and Rash    Unknown   Penicillins Rash    Has patient had a PCN reaction causing immediate rash, facial/tongue/throat swelling, SOB or lightheadedness with hypotension: No Has patient had a PCN reaction causing severe rash involving mucus membranes or skin necrosis: No Has patient had a PCN reaction that required hospitalization: No Has patient had a PCN reaction occurring within the last 10 years: No If all of the above answers are "NO", then may proceed with Cephalosporin use.     REVIEW OF SYSTEMS (Negative unless checked)  Constitutional: '[]'$ Weight loss  '[]'$ Fever  '[]'$ Chills Cardiac: '[]'$ Chest pain   '[]'$ Chest pressure   '[]'$ Palpitations   '[]'$ Shortness of breath when laying flat   '[]'$ Shortness of breath at rest   '[]'$ Shortness of breath with exertion. Vascular:  '[]'$ Pain in legs with walking   '[]'$ Pain in legs at rest   '[]'$ Pain in legs when laying flat   '[]'$ Claudication   '[]'$ Pain in feet when walking  '[]'$ Pain in feet at rest  '[]'$ Pain in feet when laying flat   '[]'$ History of DVT   '[]'$ Phlebitis   '[]'$ Swelling in legs   '[]'$ Varicose veins   '[]'$ Non-healing ulcers Pulmonary:   '[]'$ Uses home oxygen   '[]'$ Productive cough   '[]'$ Hemoptysis   '[]'$ Wheeze  '[]'$ COPD   '[]'$ Asthma Neurologic:  '[]'$ Dizziness  '[]'$ Blackouts   '[]'$ Seizures   '[]'$ History of stroke   '[]'$ History of TIA  '[]'$ Aphasia   '[]'$ Temporary blindness   '[]'$ Dysphagia   '[]'$ Weakness or numbness in arms   '[]'$ Weakness or numbness in legs Musculoskeletal:  '[]'$ Arthritis   '[]'$ Joint swelling   '[]'$ Joint pain   '[]'$ Low back pain Hematologic:  '[]'$ Easy bruising  '[]'$ Easy bleeding   '[]'$ Hypercoagulable state   '[]'$ Anemic  '[]'$ Hepatitis Gastrointestinal:  '[]'$ Blood in stool   '[]'$ Vomiting blood  '[]'$ Gastroesophageal reflux/heartburn    '[]'$ Difficulty swallowing. Genitourinary:  '[x]'$ Chronic kidney disease   '[]'$ Difficult urination  '[]'$ Frequent urination  '[]'$ Burning with urination   '[]'$ Blood in urine Skin:  '[]'$ Rashes   '[]'$ Ulcers   '[]'$ Wounds Psychological:  '[]'$ History of anxiety   '[]'$  History of major depression.  Unable to obtain the review of systems due to the patient's severe systemic illness and altered mental status.       Physical Examination  Vitals:   11/26/21 1351 11/26/21 1356 11/26/21 1401 11/26/21 1406  BP: (!) 158/108 (!) 157/107 (!) 154/99 (!) 154/99  Pulse: (!) 129 (!) 126 (!) 127 (!) 0  Resp: (!) 31 (!) 24 (!) 28   Temp:      TempSrc:      SpO2: 100% 100% 100%   Weight:      Height:       Body mass index is 27.62 kg/m. Gen: WD/WN Head: [place]/AT, No temporalis wasting Ear/Nose/Throat: Hearing grossly intact, nares w/o erythema or drainage Eyes: Sclera non-icteric, conjunctiva clear Neck: Supple, no nuchal rigidity.  No JVD.  Pulmonary:  Good air movement, clear to auscultation bilaterally.  Cardiac: RRR, normal S1, S2, no Murmurs, rubs or gallops. Vascular: right IJ temp cath Gastrointestinal: soft, non-tender/non-distended. No guarding/reflex.  Musculoskeletal: M/S 5/5 throughout.  Extremities without ischemic changes.  No deformity or atrophy. 2 Edema in the lower extremities bilaterally Neurologic: confused Psychiatric: Difficult to assess due to the severity of patient's illness. Dermatologic: No rashes or ulcers noted.   Lymph : No Cervical, Axillary, or Inguinal lymphadenopathy.     CBC Lab Results  Component Value Date   WBC 15.7 (H) 11/26/2021   HGB 9.9 (L) 11/26/2021   HCT 32.4 (  L) 11/26/2021   MCV 92.8 11/26/2021   PLT 228 11/26/2021    BMET    Component Value Date/Time   NA 138 11/26/2021 0515   NA 127 (L) 01/20/2012 1610   K 3.7 11/26/2021 0515   K 3.5 01/20/2012 1610   CL 97 (L) 11/26/2021 0515   CL 91 (L) 01/20/2012 1610   CO2 24 11/26/2021 0515   CO2 26 01/20/2012 1610    GLUCOSE 134 (H) 11/26/2021 0515   GLUCOSE 560 (HH) 01/20/2012 1610   BUN 20 11/26/2021 0515   BUN 10 01/20/2012 1610   CREATININE 3.76 (H) 11/26/2021 0515   CREATININE 17.25 (H) 10/29/2021 1547   CALCIUM 8.2 (L) 11/26/2021 0515   CALCIUM 8.7 01/20/2012 1610   GFRNONAA 15 (L) 11/26/2021 0515   GFRNONAA 71 09/28/2015 1524   GFRAA 35 (L) 11/23/2018 1719   GFRAA 82 09/28/2015 1524   Estimated Creatinine Clearance: 21 mL/min (A) (by C-G formula based on SCr of 3.76 mg/dL (H)).  COAG Lab Results  Component Value Date   INR 1.4 (H) 11/20/2021   INR 1.1 08/08/2021   INR 0.9 12/08/2018    Radiology PERIPHERAL VASCULAR CATHETERIZATION  Result Date: 11/26/2021 See surgical note for result.  DG Chest Port 1 View  Result Date: 11/25/2021 CLINICAL DATA:  Shortness of breath EXAM: PORTABLE CHEST 1 VIEW COMPARISON:  11/24/2021 FINDINGS: Temporary dialysis catheter is again noted on the right and stable. Cardiac shadow is enlarged but stable. Increased central vascular congestion is noted with airspace opacity consistent with pulmonary edema. Small left effusion is noted. No bony abnormality is noted. IMPRESSION: Changes consistent with vascular congestion and pulmonary edema. The overall appearance is stable from the previous day. Electronically Signed   By: Inez Catalina M.D.   On: 11/25/2021 23:45   DG Chest Port 1 View  Result Date: 11/24/2021 CLINICAL DATA:  Short of breath post thoracentesis EXAM: PORTABLE CHEST 1 VIEW COMPARISON:  Chest 11/24/2021 FINDINGS: No pneumothorax post thoracentesis. Bilateral airspace disease compatible with pulmonary edema with mild progression. Bibasilar airspace disease left greater than right unchanged. Small bilateral effusions unchanged. Central venous catheter tip at the cavoatrial junction unchanged. IMPRESSION: No pneumothorax post thoracentesis. Diffuse bilateral airspace disease compatible with pulmonary edema. Mild progression. Electronically Signed   By:  Franchot Gallo M.D.   On: 11/24/2021 13:33   DG Chest Port 1 View  Result Date: 11/24/2021 CLINICAL DATA:  Chest pain, shortness of breath EXAM: PORTABLE CHEST 1 VIEW COMPARISON:  Previous studies including the examination of 11/23/2021 FINDINGS: There is interval removal of endotracheal tube and NG tube. Tip of right IJ central venous catheter is seen at the junction of superior vena cava and right atrium. Central pulmonary vessels are prominent. Increased interstitial markings are seen in both lungs. There is small to moderate left pleural effusion. Haziness in the right lower lung field may suggest layering of small effusion. There is no pneumothorax. IMPRESSION: Cardiomegaly. CHF. Pulmonary edema. Bilateral pleural effusions, more so on the left side. Electronically Signed   By: Elmer Picker M.D.   On: 11/24/2021 11:47   ECHOCARDIOGRAM COMPLETE  Result Date: 11/23/2021    ECHOCARDIOGRAM REPORT   Patient Name:   CARNELIA OSCAR Date of Exam: 11/23/2021 Medical Rec #:  [id number]       Height:       67.0 in Accession #:    [id number]      Weight:       208.1 lb Date of Birth:  1980-07-03       BSA:          2.057 m Patient Age:    41 years        BP:           123/66 mmHg Patient Gender: F               HR:           61 bpm. Exam Location:  [facility] Procedure: 2D Echo Indications:     Cardiac Arrest  History:         Patient has no prior history of Echocardiogram examinations.                  Arrythmias:LBBB; Risk Factors:Hypertension, Diabetes and CKD.  Sonographer:     L. Thornton-Maynard Referring Phys:  Bradly Bienenstock Diagnosing Phys: Fransico Him MD IMPRESSIONS  1. Left ventricular ejection fraction, by estimation, is 40 to 45%. The left ventricle has mildly decreased function. The left ventricle demonstrates global hypokinesis. Left ventricular diastolic parameters are indeterminate. Elevated left ventricular end-diastolic pressure.  2. Right ventricular systolic function is normal. The right  ventricular size is normal.  3. Large pleural effusion in the left lateral region.  4. The mitral valve is normal in structure. Trivial mitral valve regurgitation. No evidence of mitral stenosis.  5. The aortic valve is tricuspid. There is moderate calcification of the aortic valve. Aortic valve regurgitation is not visualized. Aortic valve sclerosis/calcification is present, without any evidence of aortic stenosis. Aortic valve area, by VTI measures 2.39 cm. Aortic valve mean gradient measures 3.0 mmHg. Aortic valve Vmax measures 1.23 m/s.  6. The inferior vena cava is dilated in size with <50% respiratory variability, suggesting right atrial pressure of 15 mmHg. FINDINGS  Left Ventricle: Left ventricular ejection fraction, by estimation, is 40 to 45%. The left ventricle has mildly decreased function. The left ventricle demonstrates global hypokinesis. The left ventricular internal cavity size was normal in size. There is  no left ventricular hypertrophy. Left ventricular diastolic parameters are indeterminate. Elevated left ventricular end-diastolic pressure. Right Ventricle: The right ventricular size is normal. No increase in right ventricular wall thickness. Right ventricular systolic function is normal. Left Atrium: Left atrial size was normal in size. Right Atrium: Right atrial size was normal in size. Pericardium: Trivial pericardial effusion is present. The pericardial effusion is circumferential. Mitral Valve: The mitral valve is normal in structure. Trivial mitral valve regurgitation. No evidence of mitral valve stenosis. Tricuspid Valve: The tricuspid valve is normal in structure. Tricuspid valve regurgitation is trivial. No evidence of tricuspid stenosis. Aortic Valve: The aortic valve is tricuspid. There is moderate calcification of the aortic valve. Aortic valve regurgitation is not visualized. Aortic valve sclerosis/calcification is present, without any evidence of aortic stenosis. Aortic valve mean  gradient measures 3.0 mmHg. Aortic valve peak gradient measures 6.1 mmHg. Aortic valve area, by VTI measures 2.39 cm. Pulmonic Valve: The pulmonic valve was normal in structure. Pulmonic valve regurgitation is trivial. No evidence of pulmonic stenosis. Aorta: The aortic root is normal in size and structure. Venous: The inferior vena cava is dilated in size with less than 50% respiratory variability, suggesting right atrial pressure of 15 mmHg. IAS/Shunts: No atrial level shunt detected by color flow Doppler. Additional Comments: There is a large pleural effusion in the left lateral region.  LEFT VENTRICLE PLAX 2D LVIDd:         5.10 cm      Diastology LVIDs:  3.30 cm      LV e' medial:    5.98 cm/s LV PW:         1.30 cm      LV E/e' medial:  17.7 LV IVS:        1.10 cm      LV e' lateral:   7.72 cm/s LVOT diam:     2.00 cm      LV E/e' lateral: 13.7 LV SV:         58 LV SV Index:   28 LVOT Area:     3.14 cm  LV Volumes (MOD) LV vol d, MOD A2C: 119.0 ml LV vol d, MOD A4C: 115.0 ml LV vol s, MOD A2C: 70.2 ml LV vol s, MOD A4C: 61.4 ml LV SV MOD A2C:     48.8 ml LV SV MOD A4C:     115.0 ml LV SV MOD BP:      53.0 ml RIGHT VENTRICLE RV S prime:     10.90 cm/s TAPSE (M-mode): 1.9 cm LEFT ATRIUM             Index        RIGHT ATRIUM           Index LA diam:        3.40 cm 1.65 cm/m   RA Area:     12.10 cm LA Vol (A2C):   34.5 ml 16.77 ml/m  RA Volume:   24.70 ml  12.01 ml/m LA Vol (A4C):   49.8 ml 24.21 ml/m LA Biplane Vol: 42.5 ml 20.66 ml/m  AORTIC VALVE                    PULMONIC VALVE AV Area (Vmax):    2.35 cm     PV Vmax:       0.90 m/s AV Area (Vmean):   2.27 cm     PV Peak grad:  3.2 mmHg AV Area (VTI):     2.39 cm AV Vmax:           123.00 cm/s AV Vmean:          84.800 cm/s AV VTI:            0.243 m AV Peak Grad:      6.1 mmHg AV Mean Grad:      3.0 mmHg LVOT Vmax:         92.20 cm/s LVOT Vmean:        61.300 cm/s LVOT VTI:          0.185 m LVOT/AV VTI ratio: 0.76  AORTA Ao Root diam: 2.90  cm Ao Asc diam:  2.90 cm MITRAL VALVE MV Area (PHT): 4.49 cm     SHUNTS MV Decel Time: 169 msec     Systemic VTI:  0.18 m MV E velocity: 106.00 cm/s  Systemic Diam: 2.00 cm MV A velocity: 82.30 cm/s MV E/A ratio:  1.29 Fransico Him MD Electronically signed by Fransico Him MD Signature Date/Time: 11/23/2021/10:31:03 PM    Final    DG Chest Port 1 View  Result Date: 11/23/2021 CLINICAL DATA:  Acute respiratory failure. EXAM: PORTABLE CHEST 1 VIEW COMPARISON:  Chest x-ray dated November 20, 2021. FINDINGS: Unchanged endotracheal and enteric tubes. Unchanged right internal jugular dialysis catheter. Stable cardiomediastinal silhouette. Hazy density at both lung bases likely reflecting layering pleural effusions and atelectasis, similar to slightly improved. No pneumothorax. No acute osseous abnormality. IMPRESSION: 1. Stable to slightly improved layering  bilateral pleural effusions and bibasilar atelectasis. Electronically Signed   By: Titus Dubin M.D.   On: 11/23/2021 09:01   DG Abd 1 View  Result Date: 11/20/2021 CLINICAL DATA:  Status post OG tube placement. EXAM: ABDOMEN - 1 VIEW COMPARISON:  November 04, 2021 FINDINGS: An orogastric tube is seen with its distal tip overlying the body of the stomach. The distal side hole sits approximately 5.1 cm distal to the expected region of the gastroesophageal junction. The bowel gas pattern is normal. No radio-opaque calculi or other significant radiographic abnormality are seen. IMPRESSION: Orogastric tube positioning, as described above. Electronically Signed   By: Virgina Norfolk M.D.   On: 11/20/2021 21:45   DG Chest 1 View  Result Date: 11/20/2021 CLINICAL DATA:  Status post line placement. EXAM: CHEST  1 VIEW COMPARISON:  November 20, 2021 FINDINGS: There is stable endotracheal tube and nasogastric tube positioning. Interval right internal jugular venous catheter placement is seen with its distal tip at the junction of the superior vena cava and right  atrium. The cardiac silhouette is mildly enlarged and unchanged in size. Stable marked severity bilateral airspace disease is seen. Small bilateral pleural effusions are again noted. No pneumothorax is identified. The visualized skeletal structures are unremarkable. IMPRESSION: 1. Interval right internal jugular venous catheter placement positioning, as described above. 2. Stable endotracheal tube and nasogastric tube positioning. Withdrawal of the ET tube by approximately 2 cm is recommended. 3. Stable marked severity bilateral airspace disease. Electronically Signed   By: Virgina Norfolk M.D.   On: 11/20/2021 21:44   CT Angio Chest PE W/Cm &/Or Wo Cm  Result Date: 11/20/2021 CLINICAL DATA:  Cardiac arrest; respiratory distress rule out PE EXAM: CT ANGIOGRAPHY CHEST WITH CONTRAST TECHNIQUE: Multidetector CT imaging of the chest was performed using the standard protocol during bolus administration of intravenous contrast. Multiplanar CT image reconstructions and MIPs were obtained to evaluate the vascular anatomy. RADIATION DOSE REDUCTION: This exam was performed according to the departmental dose-optimization program which includes automated exposure control, adjustment of the mA and/or kV according to patient size and/or use of iterative reconstruction technique. CONTRAST:  51m OMNIPAQUE IOHEXOL 350 MG/ML SOLN COMPARISON:  Radiographs earlier today FINDINGS: Cardiovascular: Satisfactory opacification of the pulmonary arteries to the segmental level. No evidence of pulmonary embolism. Mild cardiomegaly. Trace pericardial effusion. Aortic and coronary artery atherosclerotic calcification. Mediastinum/Nodes: No enlarged mediastinal, hilar, or axillary lymph nodes. ETT within the low intrathoracic trachea projecting toward the right mainstem bronchus with tip 6 mm from the carina. Subdiaphragmatic enteric tube. Lungs/Pleura: Moderate-large bilateral pleural effusions and associated atelectasis/consolidation.  Interlobular septal thickening greatest in the upper lungs. Diffuse peribronchovascular consolidation. Upper Abdomen: No acute abnormality. Musculoskeletal: Buckle fracture of the anterior sternal wall. The posterior sternal wall is intact. No acute rib fracture. Review of the MIP images confirms the above findings. IMPRESSION: 1. Negative for acute pulmonary embolism. 2. Moderate-large bilateral pleural effusions. 3. Peribronchovascular infiltrates bilaterally favored infectious/inflammatory likely with superimposed edema. 4. The ETT tip is within the low intrathoracic trachea approximately 6 mm from the carina. Recommend repositioning. 5. Buckle fracture of the anterior sternal wall. The posterior sternal wall is intact. 6. Cardiomegaly. 7. Aortic and coronary artery atherosclerotic calcification Electronically Signed   By: TPlacido SouM.D.   On: 11/20/2021 19:40   CT HEAD WO CONTRAST (5MM)  Result Date: 11/20/2021 CLINICAL DATA:  New onset seizure EXAM: CT HEAD WITHOUT CONTRAST TECHNIQUE: Contiguous axial images were obtained from the base of the skull through  the vertex without intravenous contrast. RADIATION DOSE REDUCTION: This exam was performed according to the departmental dose-optimization program which includes automated exposure control, adjustment of the mA and/or kV according to patient size and/or use of iterative reconstruction technique. COMPARISON:  None Available. FINDINGS: Brain: No evidence of acute infarction, hemorrhage, hydrocephalus, extra-axial collection or mass lesion/mass effect. Vascular: Negative for hyperdense vessel Skull: Negative Sinuses/Orbits: Paranasal sinuses clear.  Negative orbit Other: None IMPRESSION: Negative CT head Electronically Signed   By: Franchot Gallo M.D.   On: 11/20/2021 19:34   DG Chest Portable 1 View  Result Date: 11/20/2021 CLINICAL DATA:  Intubation.  ET tube, OG tube placement EXAM: PORTABLE CHEST 1 VIEW COMPARISON:  11/18/2021 FINDINGS:  Endotracheal tube 1.5 cm above the carina. OG tube is in the stomach. Cardiomegaly. Diffuse bilateral airspace disease, worsening since prior study, favor edema. Suspect small right effusion. No acute bony abnormality. IMPRESSION: Worsening bilateral airspace disease most likely edema/CHF. Small right effusion. Electronically Signed   By: Rolm Baptise M.D.   On: 11/20/2021 18:13   DG Chest 2 View  Result Date: 11/18/2021 CLINICAL DATA:  Chest pain and dyspnea EXAM: CHEST - 2 VIEW COMPARISON:  Radiographs 10/30/2021 FINDINGS: Similar cardiomegaly decreased pulmonary vascular and interstitial. Decreased streaky right basilar opacities. Small right pleural effusion. IMPRESSION: 1. Decreased streaky opacities in the right lung base. 2. Similar cardiomegaly. Decreased pulmonary vascular congestion and interstitial. 3. Small right pleural effusion. Electronically Signed   By: Placido Sou M.D.   On: 11/18/2021 20:29   DG Abd 1 View  Result Date: 11/05/2021 CLINICAL DATA:  Abdominal pain. EXAM: ABDOMEN - 1 VIEW COMPARISON:  September 28, 2015 FINDINGS: The bowel gas pattern is normal. Extensive bowel content is identified throughout colon. Tubular structure projects over the right abdomen. IMPRESSION: No bowel obstruction. Extensive bowel content identified throughout colon consistent with constipation. Electronically Signed   By: Abelardo Diesel M.D.   On: 11/05/2021 13:12   DG Chest Portable 1 View  Result Date: 10/30/2021 CLINICAL DATA:  Weakness EXAM: PORTABLE CHEST 1 VIEW COMPARISON:  10/13/2021, 09/30/2021 FINDINGS: Cardiomegaly with vascular congestion and mild diffuse interstitial opacity. Small right effusion. Streaky right basilar opacity. IMPRESSION: 1. Mild cardiomegaly with slight central congestion and minimal edema. Small right-sided effusion. 2. Streaky basilar opacity on the right suggestive of atelectasis Electronically Signed   By: Donavan Foil M.D.   On: 10/30/2021 17:53       Assessment/Plan 1. ARF on chronic renal failure    We will proceed with tunneled dialysis catheter placement at this time.  Risks and benefits discussed with patient and/or family, and the catheter will be placed to allow immediate initiation of dialysis.  If the patient's renal function does not improve throughout the hospital course, we will be happy to place a tunneled dialysis catheter for long term use prior to discharge.     Hortencia Pilar, MD  11/26/2021 2:16 PM

## 2021-11-26 NOTE — H&P (View-Only) (Signed)
[facility] Vascular Consult Note  MRN : [id number]  Nichole Wiley is a 41 y.o. (1981-02-10) female who presents with chief complaint of needs dialysis access.  History of Present Illness: Patient is admitted to the hospital with volume overload and has developed acute worsening of her chronic renal failure.    9/13 presented to ER with severe SOB, while in ER, patient became unresponsive and had acute sudden cardiac arrest and placed on VENT.   The nephrology service has decided to initiate dialysis at this time, and a temporary dialysis catheter was placed on  10/01/2021 for immediate dialysis use.  We are now asked for a tunneled catheter.   Current Facility-Administered Medications  Medication Dose Route Frequency Provider Last Rate Last Admin   [facility] Hold] 0.9 %  sodium chloride infusion  250 mL Intravenous PRN Sakai, Isami, DO       0.9 %  sodium chloride infusion  250 mL Intravenous Continuous Sakai, Isami, DO 20 mL/hr at 11/26/21 1322 250 mL at 11/26/21 1322   [MAR Hold] acetaminophen (TYLENOL) tablet 1,000 mg  1,000 mg Oral Q6H PRN Flora Lipps, MD       [MAR Hold] Chlorhexidine Gluconate Cloth 2 % PADS 6 each  6 each Topical Q0600 Sakai, Isami, DO   6 each at 11/26/21 0418   [MAR Hold] docusate sodium (COLACE) capsule 100 mg  100 mg Oral BID PRN Lysle Pearl, Isami, DO       [MAR Hold] epoetin alfa (EPOGEN) injection 4,000 Units  4,000 Units Intravenous Q T,Th,Sa-HD Murlean Iba, MD   4,000 Units at 11/26/21 1200   [MAR Hold] feeding supplement (ENSURE ENLIVE / ENSURE PLUS) liquid 237 mL  237 mL Oral TID BM Bradly Bienenstock, NP       [MAR Hold] fentaNYL (SUBLIMAZE) injection 12.5-25 mcg  12.5-25 mcg Intravenous Q2H PRN Darel Hong D, NP   25 mcg at 11/25/21 2246   fentaNYL (SUBLIMAZE) injection    PRN Mylo Driskill, Dolores Lory, MD   25 mcg at 11/26/21 1335   heparin 10000 UNIT/ML injection            [MAR Hold] heparin injection 5,000 Units  5,000 Units Subcutaneous  Q8H [name], MD   5,000 Units at 11/26/21 0546   levothyroxine (SYNTHROID) tablet 25 mcg  25 mcg Oral Daily Lorella Nimrod, MD       [MAR Hold] linezolid (ZYVOX) tablet 600 mg  600 mg Oral Q12H Beers, Shanon Brow, RPH   600 mg at 11/25/21 2143   metoprolol tartrate (LOPRESSOR) tablet 25 mg  25 mg Oral BID Lorella Nimrod, MD       midazolam (VERSED) injection    PRN Lyllie Cobbins, Dolores Lory, MD   1 mg at 11/26/21 1335   [MAR Hold] multivitamin (RENA-VIT) tablet 1 tablet  1 tablet Per Tube QHS Sakai, Isami, DO   1 tablet at 11/25/21 2142   [MAR Hold] ondansetron (ZOFRAN) injection 4 mg  4 mg Intravenous Q6H PRN Benjamine Sprague, DO       [facility] Hold] Oral care mouth rinse  15 mL Mouth Rinse PRN Sakai, Isami, DO       [MAR Hold] pantoprazole (PROTONIX) EC tablet 40 mg  40 mg Oral Daily Benita Gutter, [facility]       [MAR Hold] polyethylene glycol (MIRALAX / GLYCOLAX) packet 17 g  17 g Oral Daily PRN Sakai, Isami, DO       [facility] Hold] sodium chloride flush (NS) 0.9 %  injection 3 mL  3 mL Intravenous Q12H Sakai, Isami, DO   3 mL at 11/25/21 2159   [facility] Hold] sodium chloride flush (NS) 0.9 % injection 3 mL  3 mL Intravenous PRN Sakai, Isami, DO       vancomycin (VANCOCIN) IVPB 1000 mg/200 mL premix  1,000 mg Intravenous To OR Tommy Goostree, Dolores Lory, MD 200 mL/hr at 11/26/21 1331 1,000 mg at 11/26/21 1331    Past Medical History:  Diagnosis Date   Abdominal mass    Arthritis    back   Chronic kidney disease    Colon polyp    Diabetes 1.5, managed as type 1 ([place])    Drug addiction ([place])    hx (pain pills after c-sect)   GERD (gastroesophageal reflux disease)    Hypertension    Pelvic floor dysfunction    Urine incontinence     Past Surgical History:  Procedure Laterality Date   ABDOMINAL HYSTERECTOMY  2005   CAPD REMOVAL N/A 11/22/2021   Procedure: CONTINUOUS AMBULATORY PERITONEAL DIALYSIS  (CAPD) CATHETER REMOVAL;  Surgeon: Benjamine Sprague, DO;  Location: [facility];  Service: General;  Laterality: N/A;    CESAREAN SECTION  2003   COLONOSCOPY WITH PROPOFOL N/A 09/14/2015   Procedure: COLONOSCOPY WITH PROPOFOL;  Surgeon: Lucilla Lame, MD;  Location: [place];  Service: Endoscopy;  Laterality: N/A;  Diabetic - insulin   CYSTOSCOPY W/ RETROGRADES Right 07/06/2017   Procedure: CYSTOSCOPY WITH RETROGRADE PYELOGRAM;  Surgeon: Hollice Espy, MD;  Location: [facility];  Service: Urology;  Laterality: Right;   CYSTOSCOPY W/ URETERAL STENT PLACEMENT Right 06/06/2017   Procedure: CYSTOSCOPY WITH RETROGRADE PYELOGRAM/URETERAL STENT PLACEMENT;  Surgeon: Lucas Mallow, MD;  Location: [facility];  Service: Urology;  Laterality: Right;   CYSTOSCOPY W/ URETERAL STENT PLACEMENT Right 07/06/2017   Procedure: CYSTOSCOPY WITH STENT REPLACEMENT;  Surgeon: Hollice Espy, MD;  Location: [facility];  Service: Urology;  Laterality: Right;   ESOPHAGOGASTRODUODENOSCOPY (EGD) WITH PROPOFOL N/A 06/19/2016   Procedure: ESOPHAGOGASTRODUODENOSCOPY (EGD) WITH PROPOFOL;  Surgeon: Jonathon Bellows, MD;  Location: [facility];  Service: Endoscopy;  Laterality: N/A;   LAPAROSCOPY     LYSIS OF ADHESION     POLYPECTOMY  09/14/2015   Procedure: POLYPECTOMY;  Surgeon: Lucilla Lame, MD;  Location: [place];  Service: Endoscopy;;   RIGHT OOPHORECTOMY  2012   TEMPORARY DIALYSIS CATHETER N/A 10/01/2021   Procedure: TEMPORARY DIALYSIS CATHETER;  Surgeon: Algernon Huxley, MD;  Location: [place] [facility];  Service: Cardiovascular;  Laterality: N/A;   TONSILLECTOMY  1987   and adenoids   TUBAL LIGATION  2004   VULVECTOMY  2011   lipoma    Social History Social History   Tobacco Use   Smoking status: Every Day    Packs/day: 0.50    Years: 24.00    Total pack years: 12.00    Types: Cigarettes   Smokeless tobacco: Never   Tobacco comments:    has cut down from 2 PPD  Vaping Use   Vaping Use: Never used  Substance Use Topics   Alcohol use: No   Drug use: No    Family History Family History  Problem Relation Age of  Onset   Depression Mother    Diabetes Father    Heart disease Maternal Grandmother    Depression Maternal Grandmother    Dementia Maternal Grandmother    Diabetes Maternal Grandmother    Diabetes Paternal Grandmother     Allergies  Allergen Reactions  Cephalexin Other (See Comments) and Rash    Unknown   Penicillins Rash    Has patient had a PCN reaction causing immediate rash, facial/tongue/throat swelling, SOB or lightheadedness with hypotension: No Has patient had a PCN reaction causing severe rash involving mucus membranes or skin necrosis: No Has patient had a PCN reaction that required hospitalization: No Has patient had a PCN reaction occurring within the last 10 years: No If all of the above answers are "NO", then may proceed with Cephalosporin use.     REVIEW OF SYSTEMS (Negative unless checked)  Constitutional: '[]'$ Weight loss  '[]'$ Fever  '[]'$ Chills Cardiac: '[]'$ Chest pain   '[]'$ Chest pressure   '[]'$ Palpitations   '[]'$ Shortness of breath when laying flat   '[]'$ Shortness of breath at rest   '[]'$ Shortness of breath with exertion. Vascular:  '[]'$ Pain in legs with walking   '[]'$ Pain in legs at rest   '[]'$ Pain in legs when laying flat   '[]'$ Claudication   '[]'$ Pain in feet when walking  '[]'$ Pain in feet at rest  '[]'$ Pain in feet when laying flat   '[]'$ History of DVT   '[]'$ Phlebitis   '[]'$ Swelling in legs   '[]'$ Varicose veins   '[]'$ Non-healing ulcers Pulmonary:   '[]'$ Uses home oxygen   '[]'$ Productive cough   '[]'$ Hemoptysis   '[]'$ Wheeze  '[]'$ COPD   '[]'$ Asthma Neurologic:  '[]'$ Dizziness  '[]'$ Blackouts   '[]'$ Seizures   '[]'$ History of stroke   '[]'$ History of TIA  '[]'$ Aphasia   '[]'$ Temporary blindness   '[]'$ Dysphagia   '[]'$ Weakness or numbness in arms   '[]'$ Weakness or numbness in legs Musculoskeletal:  '[]'$ Arthritis   '[]'$ Joint swelling   '[]'$ Joint pain   '[]'$ Low back pain Hematologic:  '[]'$ Easy bruising  '[]'$ Easy bleeding   '[]'$ Hypercoagulable state   '[]'$ Anemic  '[]'$ Hepatitis Gastrointestinal:  '[]'$ Blood in stool   '[]'$ Vomiting blood  '[]'$ Gastroesophageal reflux/heartburn    '[]'$ Difficulty swallowing. Genitourinary:  '[x]'$ Chronic kidney disease   '[]'$ Difficult urination  '[]'$ Frequent urination  '[]'$ Burning with urination   '[]'$ Blood in urine Skin:  '[]'$ Rashes   '[]'$ Ulcers   '[]'$ Wounds Psychological:  '[]'$ History of anxiety   '[]'$  History of major depression.  Unable to obtain the review of systems due to the patient's severe systemic illness and altered mental status.       Physical Examination  Vitals:   11/26/21 1351 11/26/21 1356 11/26/21 1401 11/26/21 1406  BP: (!) 158/108 (!) 157/107 (!) 154/99 (!) 154/99  Pulse: (!) 129 (!) 126 (!) 127 (!) 0  Resp: (!) 31 (!) 24 (!) 28   Temp:      TempSrc:      SpO2: 100% 100% 100%   Weight:      Height:       Body mass index is 27.62 kg/m. Gen: WD/WN Head: [place]/AT, No temporalis wasting Ear/Nose/Throat: Hearing grossly intact, nares w/o erythema or drainage Eyes: Sclera non-icteric, conjunctiva clear Neck: Supple, no nuchal rigidity.  No JVD.  Pulmonary:  Good air movement, clear to auscultation bilaterally.  Cardiac: RRR, normal S1, S2, no Murmurs, rubs or gallops. Vascular: right IJ temp cath Gastrointestinal: soft, non-tender/non-distended. No guarding/reflex.  Musculoskeletal: M/S 5/5 throughout.  Extremities without ischemic changes.  No deformity or atrophy. 2 Edema in the lower extremities bilaterally Neurologic: confused Psychiatric: Difficult to assess due to the severity of patient's illness. Dermatologic: No rashes or ulcers noted.   Lymph : No Cervical, Axillary, or Inguinal lymphadenopathy.     CBC Lab Results  Component Value Date   WBC 15.7 (H) 11/26/2021   HGB 9.9 (L) 11/26/2021   HCT 32.4 (  L) 11/26/2021   MCV 92.8 11/26/2021   PLT 228 11/26/2021    BMET    Component Value Date/Time   NA 138 11/26/2021 0515   NA 127 (L) 01/20/2012 1610   K 3.7 11/26/2021 0515   K 3.5 01/20/2012 1610   CL 97 (L) 11/26/2021 0515   CL 91 (L) 01/20/2012 1610   CO2 24 11/26/2021 0515   CO2 26 01/20/2012 1610    GLUCOSE 134 (H) 11/26/2021 0515   GLUCOSE 560 (HH) 01/20/2012 1610   BUN 20 11/26/2021 0515   BUN 10 01/20/2012 1610   CREATININE 3.76 (H) 11/26/2021 0515   CREATININE 17.25 (H) 10/29/2021 1547   CALCIUM 8.2 (L) 11/26/2021 0515   CALCIUM 8.7 01/20/2012 1610   GFRNONAA 15 (L) 11/26/2021 0515   GFRNONAA 71 09/28/2015 1524   GFRAA 35 (L) 11/23/2018 1719   GFRAA 82 09/28/2015 1524   Estimated Creatinine Clearance: 21 mL/min (A) (by C-G formula based on SCr of 3.76 mg/dL (H)).  COAG Lab Results  Component Value Date   INR 1.4 (H) 11/20/2021   INR 1.1 08/08/2021   INR 0.9 12/08/2018    Radiology PERIPHERAL VASCULAR CATHETERIZATION  Result Date: 11/26/2021 See surgical note for result.  DG Chest Port 1 View  Result Date: 11/25/2021 CLINICAL DATA:  Shortness of breath EXAM: PORTABLE CHEST 1 VIEW COMPARISON:  11/24/2021 FINDINGS: Temporary dialysis catheter is again noted on the right and stable. Cardiac shadow is enlarged but stable. Increased central vascular congestion is noted with airspace opacity consistent with pulmonary edema. Small left effusion is noted. No bony abnormality is noted. IMPRESSION: Changes consistent with vascular congestion and pulmonary edema. The overall appearance is stable from the previous day. Electronically Signed   By: Inez Catalina M.D.   On: 11/25/2021 23:45   DG Chest Port 1 View  Result Date: 11/24/2021 CLINICAL DATA:  Short of breath post thoracentesis EXAM: PORTABLE CHEST 1 VIEW COMPARISON:  Chest 11/24/2021 FINDINGS: No pneumothorax post thoracentesis. Bilateral airspace disease compatible with pulmonary edema with mild progression. Bibasilar airspace disease left greater than right unchanged. Small bilateral effusions unchanged. Central venous catheter tip at the cavoatrial junction unchanged. IMPRESSION: No pneumothorax post thoracentesis. Diffuse bilateral airspace disease compatible with pulmonary edema. Mild progression. Electronically Signed   By:  Franchot Gallo M.D.   On: 11/24/2021 13:33   DG Chest Port 1 View  Result Date: 11/24/2021 CLINICAL DATA:  Chest pain, shortness of breath EXAM: PORTABLE CHEST 1 VIEW COMPARISON:  Previous studies including the examination of 11/23/2021 FINDINGS: There is interval removal of endotracheal tube and NG tube. Tip of right IJ central venous catheter is seen at the junction of superior vena cava and right atrium. Central pulmonary vessels are prominent. Increased interstitial markings are seen in both lungs. There is small to moderate left pleural effusion. Haziness in the right lower lung field may suggest layering of small effusion. There is no pneumothorax. IMPRESSION: Cardiomegaly. CHF. Pulmonary edema. Bilateral pleural effusions, more so on the left side. Electronically Signed   By: Elmer Picker M.D.   On: 11/24/2021 11:47   ECHOCARDIOGRAM COMPLETE  Result Date: 11/23/2021    ECHOCARDIOGRAM REPORT   Patient Name:   VIOLET SEABURY Date of Exam: 11/23/2021 Medical Rec #:  [id number]       Height:       67.0 in Accession #:    [id number]      Weight:       208.1 lb Date of Birth:  November 06, 1980       BSA:          2.057 m Patient Age:    41 years        BP:           123/66 mmHg Patient Gender: F               HR:           61 bpm. Exam Location:  [facility] Procedure: 2D Echo Indications:     Cardiac Arrest  History:         Patient has no prior history of Echocardiogram examinations.                  Arrythmias:LBBB; Risk Factors:Hypertension, Diabetes and CKD.  Sonographer:     L. Thornton-Maynard Referring Phys:  Bradly Bienenstock Diagnosing Phys: Fransico Him MD IMPRESSIONS  1. Left ventricular ejection fraction, by estimation, is 40 to 45%. The left ventricle has mildly decreased function. The left ventricle demonstrates global hypokinesis. Left ventricular diastolic parameters are indeterminate. Elevated left ventricular end-diastolic pressure.  2. Right ventricular systolic function is normal. The right  ventricular size is normal.  3. Large pleural effusion in the left lateral region.  4. The mitral valve is normal in structure. Trivial mitral valve regurgitation. No evidence of mitral stenosis.  5. The aortic valve is tricuspid. There is moderate calcification of the aortic valve. Aortic valve regurgitation is not visualized. Aortic valve sclerosis/calcification is present, without any evidence of aortic stenosis. Aortic valve area, by VTI measures 2.39 cm. Aortic valve mean gradient measures 3.0 mmHg. Aortic valve Vmax measures 1.23 m/s.  6. The inferior vena cava is dilated in size with <50% respiratory variability, suggesting right atrial pressure of 15 mmHg. FINDINGS  Left Ventricle: Left ventricular ejection fraction, by estimation, is 40 to 45%. The left ventricle has mildly decreased function. The left ventricle demonstrates global hypokinesis. The left ventricular internal cavity size was normal in size. There is  no left ventricular hypertrophy. Left ventricular diastolic parameters are indeterminate. Elevated left ventricular end-diastolic pressure. Right Ventricle: The right ventricular size is normal. No increase in right ventricular wall thickness. Right ventricular systolic function is normal. Left Atrium: Left atrial size was normal in size. Right Atrium: Right atrial size was normal in size. Pericardium: Trivial pericardial effusion is present. The pericardial effusion is circumferential. Mitral Valve: The mitral valve is normal in structure. Trivial mitral valve regurgitation. No evidence of mitral valve stenosis. Tricuspid Valve: The tricuspid valve is normal in structure. Tricuspid valve regurgitation is trivial. No evidence of tricuspid stenosis. Aortic Valve: The aortic valve is tricuspid. There is moderate calcification of the aortic valve. Aortic valve regurgitation is not visualized. Aortic valve sclerosis/calcification is present, without any evidence of aortic stenosis. Aortic valve mean  gradient measures 3.0 mmHg. Aortic valve peak gradient measures 6.1 mmHg. Aortic valve area, by VTI measures 2.39 cm. Pulmonic Valve: The pulmonic valve was normal in structure. Pulmonic valve regurgitation is trivial. No evidence of pulmonic stenosis. Aorta: The aortic root is normal in size and structure. Venous: The inferior vena cava is dilated in size with less than 50% respiratory variability, suggesting right atrial pressure of 15 mmHg. IAS/Shunts: No atrial level shunt detected by color flow Doppler. Additional Comments: There is a large pleural effusion in the left lateral region.  LEFT VENTRICLE PLAX 2D LVIDd:         5.10 cm      Diastology LVIDs:  3.30 cm      LV e' medial:    5.98 cm/s LV PW:         1.30 cm      LV E/e' medial:  17.7 LV IVS:        1.10 cm      LV e' lateral:   7.72 cm/s LVOT diam:     2.00 cm      LV E/e' lateral: 13.7 LV SV:         58 LV SV Index:   28 LVOT Area:     3.14 cm  LV Volumes (MOD) LV vol d, MOD A2C: 119.0 ml LV vol d, MOD A4C: 115.0 ml LV vol s, MOD A2C: 70.2 ml LV vol s, MOD A4C: 61.4 ml LV SV MOD A2C:     48.8 ml LV SV MOD A4C:     115.0 ml LV SV MOD BP:      53.0 ml RIGHT VENTRICLE RV S prime:     10.90 cm/s TAPSE (M-mode): 1.9 cm LEFT ATRIUM             Index        RIGHT ATRIUM           Index LA diam:        3.40 cm 1.65 cm/m   RA Area:     12.10 cm LA Vol (A2C):   34.5 ml 16.77 ml/m  RA Volume:   24.70 ml  12.01 ml/m LA Vol (A4C):   49.8 ml 24.21 ml/m LA Biplane Vol: 42.5 ml 20.66 ml/m  AORTIC VALVE                    PULMONIC VALVE AV Area (Vmax):    2.35 cm     PV Vmax:       0.90 m/s AV Area (Vmean):   2.27 cm     PV Peak grad:  3.2 mmHg AV Area (VTI):     2.39 cm AV Vmax:           123.00 cm/s AV Vmean:          84.800 cm/s AV VTI:            0.243 m AV Peak Grad:      6.1 mmHg AV Mean Grad:      3.0 mmHg LVOT Vmax:         92.20 cm/s LVOT Vmean:        61.300 cm/s LVOT VTI:          0.185 m LVOT/AV VTI ratio: 0.76  AORTA Ao Root diam: 2.90  cm Ao Asc diam:  2.90 cm MITRAL VALVE MV Area (PHT): 4.49 cm     SHUNTS MV Decel Time: 169 msec     Systemic VTI:  0.18 m MV E velocity: 106.00 cm/s  Systemic Diam: 2.00 cm MV A velocity: 82.30 cm/s MV E/A ratio:  1.29 Fransico Him MD Electronically signed by Fransico Him MD Signature Date/Time: 11/23/2021/10:31:03 PM    Final    DG Chest Port 1 View  Result Date: 11/23/2021 CLINICAL DATA:  Acute respiratory failure. EXAM: PORTABLE CHEST 1 VIEW COMPARISON:  Chest x-ray dated November 20, 2021. FINDINGS: Unchanged endotracheal and enteric tubes. Unchanged right internal jugular dialysis catheter. Stable cardiomediastinal silhouette. Hazy density at both lung bases likely reflecting layering pleural effusions and atelectasis, similar to slightly improved. No pneumothorax. No acute osseous abnormality. IMPRESSION: 1. Stable to slightly improved layering  bilateral pleural effusions and bibasilar atelectasis. Electronically Signed   By: Titus Dubin M.D.   On: 11/23/2021 09:01   DG Abd 1 View  Result Date: 11/20/2021 CLINICAL DATA:  Status post OG tube placement. EXAM: ABDOMEN - 1 VIEW COMPARISON:  November 04, 2021 FINDINGS: An orogastric tube is seen with its distal tip overlying the body of the stomach. The distal side hole sits approximately 5.1 cm distal to the expected region of the gastroesophageal junction. The bowel gas pattern is normal. No radio-opaque calculi or other significant radiographic abnormality are seen. IMPRESSION: Orogastric tube positioning, as described above. Electronically Signed   By: Virgina Norfolk M.D.   On: 11/20/2021 21:45   DG Chest 1 View  Result Date: 11/20/2021 CLINICAL DATA:  Status post line placement. EXAM: CHEST  1 VIEW COMPARISON:  November 20, 2021 FINDINGS: There is stable endotracheal tube and nasogastric tube positioning. Interval right internal jugular venous catheter placement is seen with its distal tip at the junction of the superior vena cava and right  atrium. The cardiac silhouette is mildly enlarged and unchanged in size. Stable marked severity bilateral airspace disease is seen. Small bilateral pleural effusions are again noted. No pneumothorax is identified. The visualized skeletal structures are unremarkable. IMPRESSION: 1. Interval right internal jugular venous catheter placement positioning, as described above. 2. Stable endotracheal tube and nasogastric tube positioning. Withdrawal of the ET tube by approximately 2 cm is recommended. 3. Stable marked severity bilateral airspace disease. Electronically Signed   By: Virgina Norfolk M.D.   On: 11/20/2021 21:44   CT Angio Chest PE W/Cm &/Or Wo Cm  Result Date: 11/20/2021 CLINICAL DATA:  Cardiac arrest; respiratory distress rule out PE EXAM: CT ANGIOGRAPHY CHEST WITH CONTRAST TECHNIQUE: Multidetector CT imaging of the chest was performed using the standard protocol during bolus administration of intravenous contrast. Multiplanar CT image reconstructions and MIPs were obtained to evaluate the vascular anatomy. RADIATION DOSE REDUCTION: This exam was performed according to the departmental dose-optimization program which includes automated exposure control, adjustment of the mA and/or kV according to patient size and/or use of iterative reconstruction technique. CONTRAST:  39m OMNIPAQUE IOHEXOL 350 MG/ML SOLN COMPARISON:  Radiographs earlier today FINDINGS: Cardiovascular: Satisfactory opacification of the pulmonary arteries to the segmental level. No evidence of pulmonary embolism. Mild cardiomegaly. Trace pericardial effusion. Aortic and coronary artery atherosclerotic calcification. Mediastinum/Nodes: No enlarged mediastinal, hilar, or axillary lymph nodes. ETT within the low intrathoracic trachea projecting toward the right mainstem bronchus with tip 6 mm from the carina. Subdiaphragmatic enteric tube. Lungs/Pleura: Moderate-large bilateral pleural effusions and associated atelectasis/consolidation.  Interlobular septal thickening greatest in the upper lungs. Diffuse peribronchovascular consolidation. Upper Abdomen: No acute abnormality. Musculoskeletal: Buckle fracture of the anterior sternal wall. The posterior sternal wall is intact. No acute rib fracture. Review of the MIP images confirms the above findings. IMPRESSION: 1. Negative for acute pulmonary embolism. 2. Moderate-large bilateral pleural effusions. 3. Peribronchovascular infiltrates bilaterally favored infectious/inflammatory likely with superimposed edema. 4. The ETT tip is within the low intrathoracic trachea approximately 6 mm from the carina. Recommend repositioning. 5. Buckle fracture of the anterior sternal wall. The posterior sternal wall is intact. 6. Cardiomegaly. 7. Aortic and coronary artery atherosclerotic calcification Electronically Signed   By: TPlacido SouM.D.   On: 11/20/2021 19:40   CT HEAD WO CONTRAST (5MM)  Result Date: 11/20/2021 CLINICAL DATA:  New onset seizure EXAM: CT HEAD WITHOUT CONTRAST TECHNIQUE: Contiguous axial images were obtained from the base of the skull through  the vertex without intravenous contrast. RADIATION DOSE REDUCTION: This exam was performed according to the departmental dose-optimization program which includes automated exposure control, adjustment of the mA and/or kV according to patient size and/or use of iterative reconstruction technique. COMPARISON:  None Available. FINDINGS: Brain: No evidence of acute infarction, hemorrhage, hydrocephalus, extra-axial collection or mass lesion/mass effect. Vascular: Negative for hyperdense vessel Skull: Negative Sinuses/Orbits: Paranasal sinuses clear.  Negative orbit Other: None IMPRESSION: Negative CT head Electronically Signed   By: Franchot Gallo M.D.   On: 11/20/2021 19:34   DG Chest Portable 1 View  Result Date: 11/20/2021 CLINICAL DATA:  Intubation.  ET tube, OG tube placement EXAM: PORTABLE CHEST 1 VIEW COMPARISON:  11/18/2021 FINDINGS:  Endotracheal tube 1.5 cm above the carina. OG tube is in the stomach. Cardiomegaly. Diffuse bilateral airspace disease, worsening since prior study, favor edema. Suspect small right effusion. No acute bony abnormality. IMPRESSION: Worsening bilateral airspace disease most likely edema/CHF. Small right effusion. Electronically Signed   By: Rolm Baptise M.D.   On: 11/20/2021 18:13   DG Chest 2 View  Result Date: 11/18/2021 CLINICAL DATA:  Chest pain and dyspnea EXAM: CHEST - 2 VIEW COMPARISON:  Radiographs 10/30/2021 FINDINGS: Similar cardiomegaly decreased pulmonary vascular and interstitial. Decreased streaky right basilar opacities. Small right pleural effusion. IMPRESSION: 1. Decreased streaky opacities in the right lung base. 2. Similar cardiomegaly. Decreased pulmonary vascular congestion and interstitial. 3. Small right pleural effusion. Electronically Signed   By: Placido Sou M.D.   On: 11/18/2021 20:29   DG Abd 1 View  Result Date: 11/05/2021 CLINICAL DATA:  Abdominal pain. EXAM: ABDOMEN - 1 VIEW COMPARISON:  September 28, 2015 FINDINGS: The bowel gas pattern is normal. Extensive bowel content is identified throughout colon. Tubular structure projects over the right abdomen. IMPRESSION: No bowel obstruction. Extensive bowel content identified throughout colon consistent with constipation. Electronically Signed   By: Abelardo Diesel M.D.   On: 11/05/2021 13:12   DG Chest Portable 1 View  Result Date: 10/30/2021 CLINICAL DATA:  Weakness EXAM: PORTABLE CHEST 1 VIEW COMPARISON:  10/13/2021, 09/30/2021 FINDINGS: Cardiomegaly with vascular congestion and mild diffuse interstitial opacity. Small right effusion. Streaky right basilar opacity. IMPRESSION: 1. Mild cardiomegaly with slight central congestion and minimal edema. Small right-sided effusion. 2. Streaky basilar opacity on the right suggestive of atelectasis Electronically Signed   By: Donavan Foil M.D.   On: 10/30/2021 17:53       Assessment/Plan 1. ARF on chronic renal failure    We will proceed with tunneled dialysis catheter placement at this time.  Risks and benefits discussed with patient and/or family, and the catheter will be placed to allow immediate initiation of dialysis.  If the patient's renal function does not improve throughout the hospital course, we will be happy to place a tunneled dialysis catheter for long term use prior to discharge.     Hortencia Pilar, MD  11/26/2021 2:16 PM

## 2021-11-26 NOTE — Progress Notes (Signed)
No indication for BIPAP at this time.

## 2021-11-26 NOTE — Assessment & Plan Note (Signed)
Most likely secondary to heart failure with recurrent pulmonary edema. S/p thoracentesis on 9/17. -Continue with supportive care

## 2021-11-26 NOTE — Progress Notes (Signed)
Returned from specials via bed accompanied by RN after hemodialysis and then placement of tunneled perm cath afterwards. Left chest site unremarkable, CDI. Appropriate and oriented but slightly drowsy and fatigued. Incontinent of large amount diarrhea stool. BP and HR are elevated. 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 She has been taking it for 10 years. Cardiology in to see patient as well as psychiatry.

## 2021-11-26 NOTE — Progress Notes (Signed)
[place], [place] 11/26/21  Subjective:   Hospital day # 6  Patient known to our practice from outpatient dialysis.  Last few weeks she had purulent exit site infection with MRSA.  She was treated with intraperitoneal vancomycin and advised to get the catheter removed from her surgeon, which patient missed scheduled appt. .  Patient seen and evaluated during dialysis   HEMODIALYSIS FLOWSHEET:  Blood Flow Rate (mL/min): 200 mL/min Arterial Pressure (mmHg): -150 mmHg Venous Pressure (mmHg): 140 mmHg TMP (mmHg): 10 mmHg Ultrafiltration Rate (mL/min): 1114 mL/min Dialysate Flow Rate (mL/min): 300 ml/min Dialysis Fluid Bolus: Normal Saline Bolus Amount (mL): 300 mL  Remains on 6L [place] and was placed on Bipap overnight for shortness of breath Appears anxious   Objective:  Vital signs in last 24 hours:  Temp:  [97.3 F (36.3 C)-98.4 F (36.9 C)] 97.7 F (36.5 C) (09/19 1256) Pulse Rate:  [107-133] 129 (09/19 1256) Resp:  [13-38] 20 (09/19 1256) BP: (116-160)/(86-115) 151/103 (09/19 1256) SpO2:  [94 %-100 %] 99 % (09/19 1256) FiO2 (%):  [60 %-100 %] 100 % (09/19 1232) Weight:  [79.8 kg-80 kg] 80 kg (09/19 1232)  Weight change: -2.5 kg Filed Weights   11/25/21 0745 11/26/21 0417 11/26/21 1232  Weight: 87.5 kg 79.8 kg 80 kg    Intake/Output:    Intake/Output Summary (Last 24 hours) at 11/26/2021 1304 Last data filed at 11/26/2021 1232 Gross per 24 hour  Intake --  Output 3650 ml  Net -3650 ml      Physical Exam: General: anxious  HEENT Normocephalic, dry oral membranes  Pulm/lungs Basilar crackles, [place] O2, productive cough  CVS/Heart Regular rhythm  Abdomen:  Nondistended, tender  Extremities: 2+ pitting edema  Neurologic: Alert/oriented, able to answer simple questions  Skin: No acute rashes  Access: Right IJ temporary catheter       Basic Metabolic Panel:  Recent Labs  Lab 11/22/21 0530 11/22/21 1857 11/23/21 0413  11/23/21 1640 11/24/21 0421 11/24/21 1600 11/25/21 0427 11/26/21 0515  NA 133*   < > 131* 130* 133* 133* 137 138  K 4.1   < > 3.7 3.7 3.6 3.8 3.8 3.7  CL 98   < > 96* 96* 95* 97* 97* 97*  CO2 22   < > '24 24 27 22 25 24  '$ GLUCOSE 107*   < > 101* 130* 143* 156* 111* 134*  BUN 51*   < > 30* 19 24* 28* 22* 20  CREATININE 9.88*   < > 6.20* 4.49* 5.15* 5.43* 4.39* 3.76*  CALCIUM 7.6*   < > 7.0* 7.2* 7.3* 7.3* 7.7* 8.2*  MG 1.8  --  1.7  --  2.1  --  2.0 2.2  PHOS 6.1*   < > 4.1 3.6 4.2 5.5* 5.1* 5.4*   < > = values in this interval not displayed.      CBC: Recent Labs  Lab 11/20/21 1735 11/21/21 0228 11/21/21 1149 11/22/21 0530 11/23/21 0413 11/24/21 0421 11/25/21 0427 11/26/21 0515  WBC 15.1* 12.3*  --  8.8 8.7 13.9* 16.0* 15.7*  NEUTROABS 10.3* 10.4*  --   --   --   --   --   --   HGB 9.3* 7.1*   < > 7.0* 8.9* 9.2* 9.3* 9.9*  HCT 30.7* 22.6*   < > 22.8* 28.1* 29.2* 29.4* 32.4*  MCV 93.6 88.6  --  91.9 90.1 90.4 90.2 92.8  PLT 357 239  --  184 155 160 183  228   < > = values in this interval not displayed.       Lab Results  Component Value Date   HEPBSAG NON REACTIVE 11/21/2021   HEPBIGM NON REACTIVE 10/01/2021      Microbiology:  Recent Results (from the past 240 hour(s))  Blood Culture (routine x 2)     Status: None   Collection Time: 11/20/21  5:54 PM   Specimen: BLOOD  Result Value Ref Range Status   Specimen Description BLOOD LEFT ANTECUBITAL  Final   Special Requests   Final    BOTTLES DRAWN AEROBIC AND ANAEROBIC Blood Culture adequate volume   Culture   Final    NO GROWTH 5 DAYS Performed at [facility], [address] [place], [place] [address]    Report Status 11/25/2021 FINAL  Final  SARS Coronavirus 2 by RT PCR (hospital order, performed in [facility] hospital lab) *cepheid single result test* Anterior Nasal Swab     Status: None   Collection Time: 11/20/21  6:14 PM   Specimen: Anterior Nasal Swab  Result Value Ref Range Status    SARS Coronavirus 2 by RT PCR NEGATIVE NEGATIVE Final    Comment: (NOTE) SARS-CoV-2 target nucleic acids are NOT DETECTED.  The SARS-CoV-2 RNA is generally detectable in upper and lower respiratory specimens during the acute phase of infection. The lowest concentration of SARS-CoV-2 viral copies this assay can detect is 250 copies / mL. A negative result does not preclude SARS-CoV-2 infection and should not be used as the sole basis for treatment or other patient management decisions.  A negative result may occur with improper specimen collection / handling, submission of specimen other than nasopharyngeal swab, presence of viral mutation(s) within the areas targeted by this assay, and inadequate number of viral copies (<250 copies / mL). A negative result must be combined with clinical observations, patient history, and epidemiological information.  Fact Sheet for Patients:   https://www.patel.info/  Fact Sheet for Healthcare Providers: https://hall.com/  This test is not yet approved or  cleared by the [place] FDA and has been authorized for detection and/or diagnosis of SARS-CoV-2 by FDA under an Emergency Use Authorization (EUA).  This EUA will remain in effect (meaning this test can be used) for the duration of the COVID-19 declaration under Section 564(b)(1) of the Act, 21 U.S.C. section 360bbb-3(b)(1), unless the authorization is terminated or revoked sooner.  Performed at [facility]: [place] [address], [address], [place], [place] [address]   Urine Culture     Status: None   Collection Time: 11/20/21  6:30 PM   Specimen: Urine, Random  Result Value Ref Range Status   Specimen Description   Final    URINE, RANDOM Performed at [facility], [address] [place], [place] [address]    Special Requests   Final    NONE Performed at [facility], [address] [place], [place] [address]    Culture    Final    NO GROWTH Performed at [facility], [address] [place], [place] [address]    Report Status 11/22/2021 FINAL  Final  MRSA Next Gen by PCR, Nasal     Status: Abnormal   Collection Time: 11/20/21  7:50 PM   Specimen: Nasal Mucosa; Nasal Swab  Result Value Ref Range Status   MRSA by PCR Next Gen DETECTED (A) NOT DETECTED Final    Comment: RESULT CALLED TO, READ BACK BY AND VERIFIED WITH: BETH BUONO AT  2210 ON 11/20/21 BY SS (NOTE) The GeneXpert MRSA Assay (FDA approved for NASAL specimens only), is one component of a comprehensive MRSA colonization surveillance program. It is not intended to diagnose MRSA infection nor to guide or monitor treatment for MRSA infections. Test performance is not FDA approved in patients less than 40 years old. Performed at [facility], [address] [place], [place] [address]   Blood Culture (routine x 2)     Status: None   Collection Time: 11/20/21  9:55 PM   Specimen: BLOOD  Result Value Ref Range Status   Specimen Description BLOOD CENTRAL LINE  Final   Special Requests   Final    BOTTLES DRAWN AEROBIC AND ANAEROBIC Blood Culture results may not be optimal due to an inadequate volume of blood received in culture bottles   Culture   Final    NO GROWTH 5 DAYS Performed at [facility], [address] [place], [place] [address]    Report Status 11/25/2021 FINAL  Final  Cath Tip Culture     Status: None   Collection Time: 11/22/21  4:45 PM   Specimen: Catheter Tip  Result Value Ref Range Status   Specimen Description CATH TIP  Final   Special Requests   Final    NONE Performed at [facility], [place] [address], [place], [place] [address]    Culture   Final    MODERATE METHICILLIN RESISTANT STAPHYLOCOCCUS AUREUS   Report Status 11/24/2021 FINAL  Final   Organism ID, Bacteria METHICILLIN RESISTANT STAPHYLOCOCCUS AUREUS  Final      Susceptibility   Methicillin resistant staphylococcus aureus - MIC*     CIPROFLOXACIN >=8 RESISTANT Resistant     ERYTHROMYCIN <=0.25 SENSITIVE Sensitive     GENTAMICIN <=0.5 SENSITIVE Sensitive     OXACILLIN >=4 RESISTANT Resistant     TETRACYCLINE <=1 SENSITIVE Sensitive     VANCOMYCIN 1 SENSITIVE Sensitive     TRIMETH/SULFA <=10 SENSITIVE Sensitive     CLINDAMYCIN <=0.25 SENSITIVE Sensitive     RIFAMPIN <=0.5 SENSITIVE Sensitive     Inducible Clindamycin NEGATIVE Sensitive     * MODERATE METHICILLIN RESISTANT STAPHYLOCOCCUS AUREUS  Pleural fluid culture w Gram Stain     Status: None (Preliminary result)   Collection Time: 11/24/21 11:09 AM   Specimen: Pleural Fluid  Result Value Ref Range Status   Specimen Description   Final    PLEURAL Performed at [facility], [address] [place], [place] [address]    Special Requests   Final    NONE Performed at [facility], [address] [place], [place] [address]    Gram Stain NO WBC SEEN NO ORGANISMS SEEN   Final   Culture   Final    NO GROWTH 2 DAYS Performed at [facility], [place] [address], [place], [place] [address]    Report Status PENDING  Incomplete    Coagulation Studies: No results for input(s): "LABPROT", "INR" in the last 72 hours.   Urinalysis: No results for input(s): "COLORURINE", "LABSPEC", "PHURINE", "GLUCOSEU", "HGBUR", "BILIRUBINUR", "KETONESUR", "PROTEINUR", "UROBILINOGEN", "NITRITE", "LEUKOCYTESUR" in the last 72 hours.  Invalid input(s): "APPERANCEUR"     Imaging: DG Chest Port 1 View  Result Date: 11/25/2021 CLINICAL DATA:  Shortness of breath EXAM: PORTABLE CHEST 1 VIEW COMPARISON:  11/24/2021 FINDINGS: Temporary dialysis catheter is again noted on the right and stable. Cardiac shadow is enlarged but stable. Increased central vascular congestion is noted with airspace opacity  consistent with pulmonary edema. Small left effusion is noted. No bony abnormality is noted. IMPRESSION: Changes consistent with vascular congestion and pulmonary  edema. The overall appearance is stable from the previous day. Electronically Signed   By: Inez Catalina M.D.   On: 11/25/2021 23:45   DG Chest Port 1 View  Result Date: 11/24/2021 CLINICAL DATA:  Short of breath post thoracentesis EXAM: PORTABLE CHEST 1 VIEW COMPARISON:  Chest 11/24/2021 FINDINGS: No pneumothorax post thoracentesis. Bilateral airspace disease compatible with pulmonary edema with mild progression. Bibasilar airspace disease left greater than right unchanged. Small bilateral effusions unchanged. Central venous catheter tip at the cavoatrial junction unchanged. IMPRESSION: No pneumothorax post thoracentesis. Diffuse bilateral airspace disease compatible with pulmonary edema. Mild progression. Electronically Signed   By: Franchot Gallo M.D.   On: 11/24/2021 13:33     Medications:    [MAR Hold] sodium chloride     sodium chloride Stopped (11/20/21 2201)    [MAR Hold] Chlorhexidine Gluconate Cloth  6 each Topical Q0600   [MAR Hold] epoetin (EPOGEN/PROCRIT) injection  4,000 Units Intravenous Q T,Th,Sa-HD   [MAR Hold] feeding supplement  237 mL Oral TID BM   heparin       [MAR Hold] heparin injection (subcutaneous)  5,000 Units Subcutaneous Q8H   [MAR Hold] linezolid  600 mg Oral Q12H   [MAR Hold] multivitamin  1 tablet Per Tube QHS   [MAR Hold] pantoprazole  40 mg Oral Daily   [MAR Hold] sodium chloride flush  3 mL Intravenous Q12H   [MAR Hold] sodium chloride, [MAR Hold] acetaminophen, [MAR Hold] docusate sodium, [MAR Hold] fentaNYL (SUBLIMAZE) injection, heparin, [MAR Hold] ondansetron (ZOFRAN) IV, [MAR Hold] mouth rinse, [MAR Hold] polyethylene glycol, [MAR Hold] sodium chloride flush  Assessment/ Plan:  41 y.o. female with end-stage renal disease on peritoneal dialysis chronic pain syndrome, history of diabetes, now diet controlled, hypertension, hyperlipidemia    admitted on 11/20/2021 for Cardiac arrest [facility]) [I46.9]  #End-stage renal disease with generalized  edema Peritoneal dialysis with severe MRSA exit site infection.     Receiving dialysis today for shortness of breath, UF goal 3L as tolerated. Appreciate vascular surgery placing permcath later today. Renal navigator has confirmed outpatient clinic at [facility] on a TTS schedule.     #MRSA exit site infection (11/07/21) PD catheter removed on 11/22/21.  Vancomycin completed.  #Anemia of chronic kidney disease Lab Results  Component Value Date   HGB 9.9 (L) 11/26/2021    Received blood transfusion this admission. Hgb remains acceptable Continue low-dose EPO with dialysis treatments.  Secondary hyperparathyroidism of renal origin Lab Results  Component Value Date   CALCIUM 8.2 (L) 11/26/2021   PHOS 5.4 (H) 11/26/2021   Patient with hypocalcemia and hyperphosphatemia. Calcium and phosphorus within desired range.   Acute respiratory failure with pleural effusion, bilateral.  Seen on chest x-ray.  Thoracentesis performed at bedside on 11/24/21 with yield 900 cc.   Remains on 6L and required bipap overnight. Will continue to monitor. May require additional thoracentesis. Primary team to manage.   LOS: [place] 9/19/20231:04 PM  [place], [place]

## 2021-11-26 NOTE — Progress Notes (Signed)
[place] for initiation and monitoring of heparin infusion Indication: chest pain/ACS  Allergies  Allergen Reactions   Cephalexin Other (See Comments) and Rash    Unknown   Penicillins Rash    Has patient had a PCN reaction causing immediate rash, facial/tongue/throat swelling, SOB or lightheadedness with hypotension: No Has patient had a PCN reaction causing severe rash involving mucus membranes or skin necrosis: No Has patient had a PCN reaction that required hospitalization: No Has patient had a PCN reaction occurring within the last 10 years: No If all of the above answers are "NO", then may proceed with Cephalosporin use.    Patient Measurements: Height: '5\' 7"'$  (170.2 cm) Weight: 80 kg (176 lb 5.9 oz) IBW/kg (Calculated) : 61.6 Heparin Dosing Weight: 80 kg  Vital Signs: Temp: 98.8 F (37.1 C) (09/19 1600) Temp Source: Bladder (09/19 1500) BP: 150/96 (09/19 1600) Pulse Rate: 129 (09/19 1600)  Labs: Recent Labs    11/24/21 0421 11/24/21 1600 11/25/21 0427 11/26/21 0515 11/26/21 1515  HGB 9.2*  --  9.3* 9.9*  --   HCT 29.2*  --  29.4* 32.4*  --   PLT 160  --  183 228  --   CREATININE 5.15* 5.43* 4.39* 3.76*  --   TROPONINIHS  --   --   --   --  2,189*    Estimated Creatinine Clearance: 21 mL/min (A) (by C-G formula based on SCr of 3.76 mg/dL (H)).   Medical History: Past Medical History:  Diagnosis Date   Abdominal mass    Arthritis    back   Chronic kidney disease    Colon polyp    Diabetes 1.5, managed as type 1 ([place])    Drug addiction (HCC)    hx (pain pills after c-sect)   GERD (gastroesophageal reflux disease)    Hypertension    Pelvic floor dysfunction    Urine incontinence     Medications:  Scheduled:   aspirin EC  81 mg Oral Daily   atorvastatin  40 mg Oral Daily   Chlorhexidine Gluconate Cloth  6 each Topical Q0600   epoetin (EPOGEN/PROCRIT) injection  4,000 Units Intravenous Q T,Th,Sa-HD   feeding  supplement  237 mL Oral TID BM   heparin       heparin injection (subcutaneous)  5,000 Units Subcutaneous Q8H   [START ON 11/27/2021] levothyroxine  25 mcg Oral Q0600   linezolid  600 mg Oral Q12H   metoprolol tartrate  25 mg Oral BID   multivitamin  1 tablet Per Tube QHS   pantoprazole  40 mg Oral Daily   sodium chloride flush  3 mL Intravenous Q12H    Assessment: [name] with medical history including ESRD on PD, 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 Patient has been transitioned to HD this admission. Pharmacy is asked to initiate and monitor IV heparin. No chronic anticoagulation prior to admission. Baseline INR 1.4, aPTT wnl  Goal of Therapy:  Heparin level 0.3-0.7 units/ml Monitor platelets by anticoagulation protocol: Yes   Plan:  Give 4000 units bolus x 1 Start heparin infusion at 1100 units/hr Check anti-Xa level in 8 hours and daily while on heparin Continue to monitor H&H and platelets  Dallie Piles 11/26/2021,4:48 PM

## 2021-11-26 NOTE — Assessment & Plan Note (Signed)
In the setting of sudden cardiac arrest.  Concern of [place] as she recently had the death of her fianc.  Recurrent pulmonary edema which improved with IV Lasix along with dialysis. -Cardiology was consulted. -Follow-up cardiology recommendations -Monitor volume status closely-being managed with dialysis

## 2021-11-26 NOTE — Progress Notes (Signed)
Hd tx of 3.5hrs completed. 74L total vol processed. No complications. Report given to angela lloyd, rn.  Total uf removed: 3034m Post hd wt: 80kgs Post hd v/s: 98.4 150/101(114) 125 13 100%

## 2021-11-26 NOTE — Assessment & Plan Note (Signed)
Patient was initially doing peritoneal dialysis, catheter was removed on 11/22/2021 for concern of infection.  Currently getting dialysis with temporary HD catheter which is being switched to permanent catheter today. Received dialysis today with removal of 3.5 L of fluid. -Nephrology is on board -Continue with hemodialysis

## 2021-11-26 NOTE — Assessment & Plan Note (Signed)
Most likely secondary to sepsis and sudden cardiac arrest.  Resolved. -Continue to monitor

## 2021-11-26 NOTE — Progress Notes (Signed)
PHARMACIST - PHYSICIAN COMMUNICATION  CONCERNING: IV to Oral Route Change Policy  RECOMMENDATION: This patient is receiving pantoprazole by the intravenous route.  Based on criteria approved by the Pharmacy and Therapeutics Committee, the intravenous medication(s) is/are being converted to the equivalent oral dose form(s).   DESCRIPTION: These criteria include: The patient is eating (either orally or via tube) and/or has been taking other orally administered medications for a least 24 hours The patient has no evidence of active gastrointestinal bleeding or impaired GI absorption (gastrectomy, short bowel, patient on TNA or NPO).  If you have questions about this conversion, please contact the [place], [facility] 11/26/2021 8:11 AM

## 2021-11-26 NOTE — Progress Notes (Signed)
Patient expresses anxiety and states "I can't breathe." On 4L [place], sitting up and 02 sats 100%. Reviewed therapeutic breathing w/ patient. Pt practicing. Alert, vss. RN at bedside. Safety maintained.

## 2021-11-27 ENCOUNTER — Encounter: Payer: Self-pay | Admitting: Vascular Surgery

## 2021-11-27 DIAGNOSIS — I469 Cardiac arrest, cause unspecified: Secondary | ICD-10-CM | POA: Diagnosis not present

## 2021-11-27 DIAGNOSIS — I214 Non-ST elevation (NSTEMI) myocardial infarction: Secondary | ICD-10-CM

## 2021-11-27 DIAGNOSIS — E039 Hypothyroidism, unspecified: Secondary | ICD-10-CM

## 2021-11-27 DIAGNOSIS — I5021 Acute systolic (congestive) heart failure: Secondary | ICD-10-CM | POA: Diagnosis not present

## 2021-11-27 DIAGNOSIS — N186 End stage renal disease: Secondary | ICD-10-CM | POA: Diagnosis not present

## 2021-11-27 LAB — GLUCOSE, CAPILLARY
Glucose-Capillary: 100 mg/dL — ABNORMAL HIGH (ref 70–99)
Glucose-Capillary: 129 mg/dL — ABNORMAL HIGH (ref 70–99)
Glucose-Capillary: 140 mg/dL — ABNORMAL HIGH (ref 70–99)
Glucose-Capillary: 148 mg/dL — ABNORMAL HIGH (ref 70–99)
Glucose-Capillary: 168 mg/dL — ABNORMAL HIGH (ref 70–99)

## 2021-11-27 LAB — CBC
HCT: 29.1 % — ABNORMAL LOW (ref 36.0–46.0)
Hemoglobin: 8.8 g/dL — ABNORMAL LOW (ref 12.0–15.0)
MCH: 28.1 pg (ref 26.0–34.0)
MCHC: 30.2 g/dL (ref 30.0–36.0)
MCV: 93 fL (ref 80.0–100.0)
Platelets: 187 10*3/uL (ref 150–400)
RBC: 3.13 MIL/uL — ABNORMAL LOW (ref 3.87–5.11)
RDW: 14.9 % (ref 11.5–15.5)
WBC: 9.9 10*3/uL (ref 4.0–10.5)
nRBC: 0 % (ref 0.0–0.2)

## 2021-11-27 LAB — RENAL FUNCTION PANEL
Albumin: 2.1 g/dL — ABNORMAL LOW (ref 3.5–5.0)
Anion gap: 11 (ref 5–15)
BUN: 20 mg/dL (ref 6–20)
CO2: 27 mmol/L (ref 22–32)
Calcium: 8.2 mg/dL — ABNORMAL LOW (ref 8.9–10.3)
Chloride: 98 mmol/L (ref 98–111)
Creatinine, Ser: 3.38 mg/dL — ABNORMAL HIGH (ref 0.44–1.00)
GFR, Estimated: 17 mL/min — ABNORMAL LOW (ref >60)
Glucose, Bld: 104 mg/dL — ABNORMAL HIGH (ref 70–99)
Phosphorus: 3.6 mg/dL (ref 2.5–4.6)
Potassium: 3.4 mmol/L — ABNORMAL LOW (ref 3.5–5.1)
Sodium: 136 mmol/L (ref 135–145)

## 2021-11-27 LAB — HEPARIN LEVEL (UNFRACTIONATED)
Heparin Unfractionated: 0.3 IU/mL (ref 0.30–0.70)
Heparin Unfractionated: 0.27 IU/mL — ABNORMAL LOW (ref 0.30–0.70)
Heparin Unfractionated: 0.37 IU/mL (ref 0.30–0.70)

## 2021-11-27 LAB — TROPONIN I (HIGH SENSITIVITY)
Troponin I (High Sensitivity): 2534 ng/L (ref <18)
Troponin I (High Sensitivity): 3504 ng/L (ref <18)
Troponin I (High Sensitivity): 4204 ng/L (ref <18)

## 2021-11-27 LAB — T4, FREE: Free T4: 0.44 ng/dL — ABNORMAL LOW (ref 0.61–1.12)

## 2021-11-27 LAB — TSH: TSH: 74.02 u[IU]/mL — ABNORMAL HIGH (ref 0.350–4.500)

## 2021-11-27 LAB — MAGNESIUM: Magnesium: 1.8 mg/dL (ref 1.7–2.4)

## 2021-11-27 MED ORDER — METHADONE HCL 10 MG PO TABS
20.0000 mg | ORAL_TABLET | Freq: Every day | ORAL | Status: DC
  Administered 2021-11-27 – 2021-11-29 (×2): 20 mg via ORAL
  Filled 2021-11-27 (×2): qty 2

## 2021-11-27 MED ORDER — HEPARIN BOLUS VIA INFUSION
1100.0000 [IU] | Freq: Once | INTRAVENOUS | Status: AC
  Administered 2021-11-27: 1100 [IU] via INTRAVENOUS
  Filled 2021-11-27: qty 1100

## 2021-11-27 MED ORDER — LEVOTHYROXINE SODIUM 50 MCG PO TABS
50.0000 ug | ORAL_TABLET | Freq: Every day | ORAL | Status: DC
  Administered 2021-11-29: 50 ug via ORAL
  Filled 2021-11-27: qty 1

## 2021-11-27 MED ORDER — INSULIN ASPART 100 UNIT/ML IJ SOLN
0.0000 [IU] | INTRAMUSCULAR | Status: DC
  Administered 2021-11-27: 1 [IU] via SUBCUTANEOUS
  Administered 2021-11-27: 3 [IU] via SUBCUTANEOUS
  Administered 2021-11-27: 2 [IU] via SUBCUTANEOUS
  Administered 2021-11-27 – 2021-11-28 (×2): 1 [IU] via SUBCUTANEOUS
  Administered 2021-11-28 – 2021-11-29 (×2): 3 [IU] via SUBCUTANEOUS
  Filled 2021-11-27 (×7): qty 1

## 2021-11-27 NOTE — Progress Notes (Signed)
[place] for initiation and monitoring of heparin infusion Indication: chest pain/ACS  Patient Measurements: Height: '5\' 7"'$  (170.2 cm) Weight: 76.2 kg (167 lb 15.9 oz) IBW/kg (Calculated) : 61.6 Heparin Dosing Weight: 76.2 kg  Labs: Recent Labs    11/25/21 0427 11/26/21 0515 11/26/21 1515 11/27/21 0012 11/27/21 0427  HGB 9.3* 9.9*  --  8.8*  --   HCT 29.4* 32.4*  --  29.1*  --   PLT 183 228  --  187  --   HEPARINUNFRC  --   --   --   --  0.30  CREATININE 4.39* 3.76*  --   --  3.38*  TROPONINIHS  --   --  2,189* 3,504* 4,204*     Estimated Creatinine Clearance: 22.8 mL/min (A) (by C-G formula based on SCr of 3.38 mg/dL (H)).   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 Patient has been transitioned to HD this admission. Pharmacy is asked to initiate and monitor IV heparin. No chronic anticoagulation prior to admission. Baseline INR 1.4, aPTT wnl  Goal of Therapy:  Heparin level 0.3-0.7 units/ml Monitor platelets by anticoagulation protocol: Yes   9/20 0427 HL 0.3, therapeutic x 1 9/20 1155 HL 0.27, subtherapeutic  Plan:  --Heparin level is slightly subtherapeutic --Heparin 1100 unit IV bolus and increase heparin infusion rate to 1250 units/hr --Re-check HL 8 hours from rate change --Daily CBC per protocol while on IV heparin  Benita Gutter 11/27/2021,12:29 PM

## 2021-11-27 NOTE — Assessment & Plan Note (Signed)
In the setting of sudden cardiac arrest.Recurrent pulmonary edema which improved with IV Lasix along with dialysis. -Cardiology was consulted. -Going for right and left cardiac catheterization tomorrow

## 2021-11-27 NOTE — Progress Notes (Addendum)
[place], [place] 11/27/21  Subjective:   Hospital day # 7  Patient known to our practice from outpatient dialysis.  Last few weeks she had purulent exit site infection with MRSA.  She was treated with intraperitoneal vancomycin and advised to get the catheter removed from her surgeon, which patient missed scheduled appt. .  Patient seen sitting up in bed, alert and oriented Appears well today Weaned to room air Generalized edema greatly improved Heart rate remains tachycardic  Objective:  Vital signs in last 24 hours:  Temp:  [97.7 F (36.5 C)-99.7 F (37.6 C)] 99.5 F (37.5 C) (09/20 0800) Pulse Rate:  [0-133] 121 (09/20 0800) Resp:  [13-37] 22 (09/20 0800) BP: (119-158)/(71-112) 147/90 (09/20 0800) SpO2:  [87 %-100 %] 95 % (09/20 0800) FiO2 (%):  [100 %] 100 % (09/19 1232) Weight:  [76.2 kg-80 kg] 76.2 kg (09/20 0519)  Weight change: -7.5 kg Filed Weights   11/26/21 0417 11/26/21 1232 11/27/21 0519  Weight: 79.8 kg 80 kg 76.2 kg    Intake/Output:    Intake/Output Summary (Last 24 hours) at 11/27/2021 1034 Last data filed at 11/27/2021 1000 Gross per 24 hour  Intake 500.88 ml  Output 3270 ml  Net -2769.12 ml      Physical Exam: General: anxious  HEENT Normocephalic, dry oral membranes  Pulm/lungs Diminished in bases, room air, normal effort  CVS/Heart Regular rhythm, tachycardic  Abdomen:  Nondistended, tender, soft  Extremities: + pitting edema  Neurologic: Alert/oriented, able to answer simple questions  Skin: No acute rashes  Access: Right IJ temporary catheter, left chest PermCath       Basic Metabolic Panel:  Recent Labs  Lab 11/23/21 0413 11/23/21 1640 11/24/21 0421 11/24/21 1600 11/25/21 0427 11/26/21 0515 11/27/21 0012 11/27/21 0427  NA 131*   < > 133* 133* 137 138  --  136  K 3.7   < > 3.6 3.8 3.8 3.7  --  3.4*  CL 96*   < > 95* 97* 97* 97*  --  98  CO2 24   < > '27 22 25 24  '$ --  27  GLUCOSE 101*   < >  143* 156* 111* 134*  --  104*  BUN 30*   < > 24* 28* 22* 20  --  20  CREATININE 6.20*   < > 5.15* 5.43* 4.39* 3.76*  --  3.38*  CALCIUM 7.0*   < > 7.3* 7.3* 7.7* 8.2*  --  8.2*  MG 1.7  --  2.1  --  2.0 2.2 1.8  --   PHOS 4.1   < > 4.2 5.5* 5.1* 5.4*  --  3.6   < > = values in this interval not displayed.      CBC: Recent Labs  Lab 11/20/21 1735 11/21/21 0228 11/21/21 1149 11/23/21 0413 11/24/21 0421 11/25/21 0427 11/26/21 0515 11/27/21 0012  WBC 15.1* 12.3*   < > 8.7 13.9* 16.0* 15.7* 9.9  NEUTROABS 10.3* 10.4*  --   --   --   --   --   --   HGB 9.3* 7.1*   < > 8.9* 9.2* 9.3* 9.9* 8.8*  HCT 30.7* 22.6*   < > 28.1* 29.2* 29.4* 32.4* 29.1*  MCV 93.6 88.6   < > 90.1 90.4 90.2 92.8 93.0  PLT 357 239   < > 155 160 183 228 187   < > = values in this interval not displayed.       Lab  Results  Component Value Date   HEPBSAG NON REACTIVE 11/21/2021   HEPBIGM NON REACTIVE 10/01/2021      Microbiology:  Recent Results (from the past 240 hour(s))  Blood Culture (routine x 2)     Status: None   Collection Time: 11/20/21  5:54 PM   Specimen: BLOOD  Result Value Ref Range Status   Specimen Description BLOOD LEFT ANTECUBITAL  Final   Special Requests   Final    BOTTLES DRAWN AEROBIC AND ANAEROBIC Blood Culture adequate volume   Culture   Final    NO GROWTH 5 DAYS Performed at [facility], [address], [place], [place] [address]    Report Status 11/25/2021 FINAL  Final  SARS Coronavirus 2 by RT PCR (hospital order, performed in [facility] hospital lab) *cepheid single result test* Anterior Nasal Swab     Status: None   Collection Time: 11/20/21  6:14 PM   Specimen: Anterior Nasal Swab  Result Value Ref Range Status   SARS Coronavirus 2 by RT PCR NEGATIVE NEGATIVE Final    Comment: (NOTE) SARS-CoV-2 target nucleic acids are NOT DETECTED.  The SARS-CoV-2 RNA is generally detectable in upper and lower respiratory specimens during the acute phase of  infection. The lowest concentration of SARS-CoV-2 viral copies this assay can detect is 250 copies / mL. A negative result does not preclude SARS-CoV-2 infection and should not be used as the sole basis for treatment or other patient management decisions.  A negative result may occur with improper specimen collection / handling, submission of specimen other than nasopharyngeal swab, presence of viral mutation(s) within the areas targeted by this assay, and inadequate number of viral copies (<250 copies / mL). A negative result must be combined with clinical observations, patient history, and epidemiological information.  Fact Sheet for Patients:   https://www.patel.info/  Fact Sheet for Healthcare Providers: https://hall.com/  This test is not yet approved or  cleared by the [place] FDA and has been authorized for detection and/or diagnosis of SARS-CoV-2 by FDA under an Emergency Use Authorization (EUA).  This EUA will remain in effect (meaning this test can be used) for the duration of the COVID-19 declaration under Section 564(b)(1) of the Act, 21 U.S.C. section 360bbb-3(b)(1), unless the authorization is terminated or revoked sooner.  Performed at [facility] [address], [place], [place] [address]   Urine Culture     Status: None   Collection Time: 11/20/21  6:30 PM   Specimen: Urine, Random  Result Value Ref Range Status   Specimen Description   Final    URINE, RANDOM Performed at [facility], [address], [place], [place] [address]    Special Requests   Final    NONE Performed at [facility], [address], [place], [place] [address]    Culture   Final    NO GROWTH Performed at [facility], [place] [address] [place], [place] [address]    Report Status 11/22/2021 FINAL  Final  MRSA Next Gen by PCR, Nasal     Status: Abnormal   Collection Time: 11/20/21  7:50 PM   Specimen:  Nasal Mucosa; Nasal Swab  Result Value Ref Range Status   MRSA by PCR Next Gen DETECTED (A) NOT DETECTED Final    Comment: RESULT CALLED TO, READ BACK BY AND VERIFIED WITH: BETH BUONO AT 2210 ON 11/20/21 BY SS (NOTE) The GeneXpert MRSA Assay (FDA approved for NASAL specimens only), is one component  of a comprehensive MRSA colonization surveillance program. It is not intended to diagnose MRSA infection nor to guide or monitor treatment for MRSA infections. Test performance is not FDA approved in patients less than 38 years old. Performed at [facility], [address] [place], [place] [address]   Blood Culture (routine x 2)     Status: None   Collection Time: 11/20/21  9:55 PM   Specimen: BLOOD  Result Value Ref Range Status   Specimen Description BLOOD CENTRAL LINE  Final   Special Requests   Final    BOTTLES DRAWN AEROBIC AND ANAEROBIC Blood Culture results may not be optimal due to an inadequate volume of blood received in culture bottles   Culture   Final    NO GROWTH 5 DAYS Performed at [facility], [address] [place], [place] [address]    Report Status 11/25/2021 FINAL  Final  Cath Tip Culture     Status: None   Collection Time: 11/22/21  4:45 PM   Specimen: Catheter Tip  Result Value Ref Range Status   Specimen Description CATH TIP  Final   Special Requests   Final    NONE Performed at [facility], [place] [address], [place], [place] [address]    Culture   Final    MODERATE METHICILLIN RESISTANT STAPHYLOCOCCUS AUREUS   Report Status 11/24/2021 FINAL  Final   Organism ID, Bacteria METHICILLIN RESISTANT STAPHYLOCOCCUS AUREUS  Final      Susceptibility   Methicillin resistant staphylococcus aureus - MIC*    CIPROFLOXACIN >=8 RESISTANT Resistant     ERYTHROMYCIN <=0.25 SENSITIVE Sensitive     GENTAMICIN <=0.5 SENSITIVE Sensitive     OXACILLIN >=4 RESISTANT Resistant     TETRACYCLINE <=1 SENSITIVE Sensitive     VANCOMYCIN 1 SENSITIVE  Sensitive     TRIMETH/SULFA <=10 SENSITIVE Sensitive     CLINDAMYCIN <=0.25 SENSITIVE Sensitive     RIFAMPIN <=0.5 SENSITIVE Sensitive     Inducible Clindamycin NEGATIVE Sensitive     * MODERATE METHICILLIN RESISTANT STAPHYLOCOCCUS AUREUS  Pleural fluid culture w Gram Stain     Status: None (Preliminary result)   Collection Time: 11/24/21 11:09 AM   Specimen: Pleural Fluid  Result Value Ref Range Status   Specimen Description   Final    PLEURAL Performed at [facility], [address], [place], [place] [address]    Special Requests   Final    NONE Performed at [facility], [address] [place], [place] [address]    Gram Stain NO WBC SEEN NO ORGANISMS SEEN   Final   Culture   Final    NO GROWTH 2 DAYS Performed at [facility], [place] [address] [place],  [address]    Report Status PENDING  Incomplete    Coagulation Studies: No results for input(s): "LABPROT", "INR" in the last 72 hours.   Urinalysis: No results for input(s): "COLORURINE", "LABSPEC", "PHURINE", "GLUCOSEU", "HGBUR", "BILIRUBINUR", "KETONESUR", "PROTEINUR", "UROBILINOGEN", "NITRITE", "LEUKOCYTESUR" in the last 72 hours.  Invalid input(s): "APPERANCEUR"     Imaging: CT Angio Chest Pulmonary Embolism (PE) W or WO Contrast  Result Date: 11/26/2021 CLINICAL DATA:  Insert epic diagnosis and indication EXAM: CT ANGIOGRAPHY CHEST WITH CONTRAST TECHNIQUE: Multidetector CT imaging of the chest was performed using the standard protocol during bolus administration of intravenous contrast. Multiplanar CT image reconstructions and MIPs were obtained to evaluate the vascular anatomy. RADIATION DOSE REDUCTION: This exam was performed according to  the departmental dose-optimization program which includes automated exposure control, adjustment of the mA and/or kV according to patient size and/or use of iterative reconstruction technique. CONTRAST:  89m OMNIPAQUE IOHEXOL 350 MG/ML SOLN  COMPARISON:  Radiograph yesterday.  Chest CTA 6 days ago FINDINGS: Cardiovascular: There are no filling defects within the pulmonary arteries to suggest pulmonary embolus. Normal caliber thoracic aorta with mild atherosclerosis. Cannot assess for dissection given phase of contrast tailored to pulmonary artery assessment. Mild cardiomegaly. Small pericardial effusion has minimally increased. Contrast refluxes into the hepatic veins and IVC. New left-sided internal jugular dialysis catheter, tip at the atrial caval junction. Gas in the overlying left supraclavicular soft tissues likely related to recent placement. Right internal jugular central venous catheter tip in the lower SVC. Mediastinum/Nodes: No suspicious adenopathy or mass. No mediastinal hemorrhage. Interval extubation. Patulous esophagus. Lungs/Pleura: Moderate bilateral pleural effusions have slightly diminished from prior exam. There is subtotal consolidation throughout the left lower lobe. Airspace disease in the dependent right lower lobe may represent compressive atelectasis. Perihilar consolidations have improved. Persistent but improved septal thickening and patchy ground-glass typical of pulmonary edema no pneumothorax. Upper Abdomen: Mild contrast refluxing into the hepatic veins and IVC. Vascular calcifications in the upper abdomen. No acute upper abdominal findings. Musculoskeletal: Again seen buckle fracture of the sternum. No new osseous findings. Review of the MIP images confirms the above findings. IMPRESSION: 1. No pulmonary embolus. 2. Moderate bilateral pleural effusions have slightly diminished from prior exam. 3. Persistent but diminished septal thickening and patchy ground-glass typical of resolving pulmonary edema. 4. Subtotal consolidation throughout the left lower lobe with air bronchograms, atelectasis over pneumonia. Airspace disease in the dependent right lower lobe may represent compressive atelectasis. Improving perihilar  consolidations. 5. New left-sided internal jugular dialysis catheter, tip at the atrial caval junction. Gas in the overlying left supraclavicular soft tissues likely related to recent placement. 6. Mild cardiomegaly. Contrast refluxing into the hepatic veins and IVC consistent with elevated right heart pressures. Small pericardial effusion has minimally increased. Aortic Atherosclerosis (ICD10-I70.0). Electronically Signed   By: MKeith RakeM.D.   On: 11/26/2021 18:25   PERIPHERAL VASCULAR CATHETERIZATION  Result Date: 11/26/2021 See surgical note for result.  DG Chest Port 1 View  Result Date: 11/25/2021 CLINICAL DATA:  Shortness of breath EXAM: PORTABLE CHEST 1 VIEW COMPARISON:  11/24/2021 FINDINGS: Temporary dialysis catheter is again noted on the right and stable. Cardiac shadow is enlarged but stable. Increased central vascular congestion is noted with airspace opacity consistent with pulmonary edema. Small left effusion is noted. No bony abnormality is noted. IMPRESSION: Changes consistent with vascular congestion and pulmonary edema. The overall appearance is stable from the previous day. Electronically Signed   By: MInez CatalinaM.D.   On: 11/25/2021 23:45     Medications:    sodium chloride     sodium chloride Stopped (11/26/21 1445)   heparin 1,100 Units/hr (11/27/21 1000)    aspirin EC  81 mg Oral Daily   atorvastatin  40 mg Oral Daily   Chlorhexidine Gluconate Cloth  6 each Topical Q0600   epoetin (EPOGEN/PROCRIT) injection  4,000 Units Intravenous Q T,Th,Sa-HD   feeding supplement  237 mL Oral TID BM   insulin aspart  0-9 Units Subcutaneous Q4H   [START ON 11/28/2021] levothyroxine  50 mcg Oral Q0600   linezolid  600 mg Oral Q12H   methadone  20 mg Oral Daily   metoprolol tartrate  25 mg Oral BID   multivitamin  1 tablet  Per Tube QHS   pantoprazole  40 mg Oral Daily   sodium chloride flush  3 mL Intravenous Q12H   sodium chloride, acetaminophen, docusate sodium,  fentaNYL (SUBLIMAZE) injection, labetalol, ondansetron (ZOFRAN) IV, mouth rinse, polyethylene glycol, sodium chloride flush  Assessment/ Plan:  41 y.o. female with end-stage renal disease on peritoneal dialysis chronic pain syndrome, history of diabetes, now diet controlled, hypertension, hyperlipidemia    admitted on 11/20/2021 for Cardiac arrest [facility]) [I46.9]  #End-stage renal disease with generalized edema Peritoneal dialysis with severe MRSA exit site infection.     Patient received dialysis yesterday, UF 3 L achieved.  Appreciate vascular placing left chest PermCath yesterday.  Order placed for right IJ temp cath to be removed, if not needed by nursing.  Next treatment scheduled for Thursday to accommodate outpatient schedule.  We will perform dialysis prior to scheduled cardiac cath to optimize fluid status. Renal navigator has confirmed outpatient clinic at [facility] on a TTS schedule.     #MRSA exit site infection (11/07/21) PD catheter removed on 11/22/21.  Receiving vancomycin and linezolid.  #Anemia of chronic kidney disease Lab Results  Component Value Date   HGB 8.8 (L) 11/27/2021    Received blood transfusion this admission. Hemoglobin just below desired target. We will continue low-dose EPO with dialysis treatments.  Secondary hyperparathyroidism of renal origin Lab Results  Component Value Date   CALCIUM 8.2 (L) 11/27/2021   PHOS 3.6 11/27/2021   Patient with hypocalcemia and hyperphosphatemia. Calcium remains decreased but within desired target.  Acute respiratory failure with pleural effusion, bilateral.  Seen on chest x-ray.  Thoracentesis performed at bedside on 11/24/21 with yield 900 cc.   Patient able to wean to room air and did not require BiPAP overnight.   LOS: [place] 9/20/202310:34 [place], [place]

## 2021-11-27 NOTE — Assessment & Plan Note (Signed)
Secondary to infected peritoneal catheter which was removed by general surgery on 11/22/2021.  Tip culture with MRSA.  Blood cultures remain negative. -Continue linezolid -ID on board

## 2021-11-27 NOTE — Progress Notes (Signed)
[place] for initiation and monitoring of heparin infusion Indication: chest pain/ACS  Allergies  Allergen Reactions   Cephalexin Other (See Comments) and Rash    Unknown   Penicillins Rash    Has patient had a PCN reaction causing immediate rash, facial/tongue/throat swelling, SOB or lightheadedness with hypotension: No Has patient had a PCN reaction causing severe rash involving mucus membranes or skin necrosis: No Has patient had a PCN reaction that required hospitalization: No Has patient had a PCN reaction occurring within the last 10 years: No If all of the above answers are "NO", then may proceed with Cephalosporin use.    Patient Measurements: Height: '5\' 7"'$  (170.2 cm) Weight: 76.2 kg (167 lb 15.9 oz) IBW/kg (Calculated) : 61.6 Heparin Dosing Weight: 80 kg  Vital Signs: Temp: 99.5 F (37.5 C) (09/20 0500) Temp Source: Oral (09/20 0400) BP: 125/81 (09/20 0500) Pulse Rate: 108 (09/20 0500)  Labs: Recent Labs    11/25/21 0427 11/26/21 0515 11/26/21 1515 11/27/21 0012 11/27/21 0427  HGB 9.3* 9.9*  --  8.8*  --   HCT 29.4* 32.4*  --  29.1*  --   PLT 183 228  --  187  --   HEPARINUNFRC  --   --   --   --  0.30  CREATININE 4.39* 3.76*  --   --  3.38*  TROPONINIHS  --   --  2,189* 3,504* 4,204*     Estimated Creatinine Clearance: 22.8 mL/min (A) (by C-G formula based on SCr of 3.38 mg/dL (H)).   Medical History: Past Medical History:  Diagnosis Date   Abdominal mass    Arthritis    back   Chronic kidney disease    Colon polyp    Diabetes 1.5, managed as type 1 ([place])    Drug addiction (HCC)    hx (pain pills after c-sect)   GERD (gastroesophageal reflux disease)    Hypertension    Pelvic floor dysfunction    Urine incontinence     Medications:  Scheduled:   aspirin EC  81 mg Oral Daily   atorvastatin  40 mg Oral Daily   Chlorhexidine Gluconate Cloth  6 each Topical Q0600   epoetin (EPOGEN/PROCRIT) injection   4,000 Units Intravenous Q T,Th,Sa-HD   feeding supplement  237 mL Oral TID BM   insulin aspart  0-9 Units Subcutaneous Q4H   levothyroxine  25 mcg Oral Q0600   linezolid  600 mg Oral Q12H   methadone  20 mg Oral Daily   metoprolol tartrate  25 mg Oral BID   multivitamin  1 tablet Per Tube QHS   pantoprazole  40 mg Oral Daily   sodium chloride flush  3 mL Intravenous Q12H    Assessment: [name] with medical history including ESRD on PD, 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 Patient has been transitioned to HD this admission. Pharmacy is asked to initiate and monitor IV heparin. No chronic anticoagulation prior to admission. Baseline INR 1.4, aPTT wnl  Goal of Therapy:  Heparin level 0.3-0.7 units/ml Monitor platelets by anticoagulation protocol: Yes   9/20 0427 HL 3.0, therapeutic x 1  Plan:  Continue heparin infusion at 1100 units/hr Will recheck HL in 8 hr to confirm  CBC daily while on heparin  Renda Rolls, PharmD, [facility] 11/27/2021 5:31 AM   Phillip Maffei,Natha S 11/27/2021,5:30 AM

## 2021-11-27 NOTE — Progress Notes (Signed)
[place] for initiation and monitoring of heparin infusion Indication: chest pain/ACS  Patient Measurements: Height: '5\' 7"'$  (170.2 cm) Weight: 76.2 kg (167 lb 15.9 oz) IBW/kg (Calculated) : 61.6 Heparin Dosing Weight: 76.2 kg  Labs: Recent Labs    11/25/21 0427 11/26/21 0515 11/26/21 1515 11/27/21 0012 11/27/21 0427 11/27/21 1155 11/27/21 2128  HGB 9.3* 9.9*  --  8.8*  --   --   --   HCT 29.4* 32.4*  --  29.1*  --   --   --   PLT 183 228  --  187  --   --   --   HEPARINUNFRC  --   --   --   --  0.30 0.27* 0.37  CREATININE 4.39* 3.76*  --   --  3.38*  --   --   TROPONINIHS  --   --    < > 3,504* 4,204* 2,534*  --    < > = values in this interval not displayed.     Estimated Creatinine Clearance: 22.8 mL/min (A) (by C-G formula based on SCr of 3.38 mg/dL (H)).   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 Patient has been transitioned to HD this admission. Pharmacy is asked to initiate and monitor IV heparin. No chronic anticoagulation prior to admission. Baseline INR 1.4, aPTT wnl  Goal of Therapy:  Heparin level 0.3-0.7 units/ml Monitor platelets by anticoagulation protocol: Yes   9/20 0427 HL 0.3  9/20 1155 HL 0.27, subtherapeutic 9/20 2128 HL 0.37, therapeutic x1  Plan:  --Heparin level is therapeutic x1 --Continue heparin infusion rate at 1250 units/hr --Re-check HL 8 hours to confirm  current rate; then daily once consecutively therapeutic --Daily CBC per protocol while on IV heparin  Nichole Wiley 11/27/2021,10:02 PM

## 2021-11-27 NOTE — Progress Notes (Signed)
Progress Note   Patient: Nichole Wiley KVQ:[id number] DOB: 01-24-1981 DOA: 11/20/2021     7 DOS: the patient was seen and examined on 11/27/2021   Brief hospital course: ICU Transfer:  Taken from prior notes.  41 y.o. female whose medical history is most notable for chronic end-stage renal disease on peritoneal dialysis, as well as chronic pain syndrome, pelvic floor dysfunction, prior history of opioid addiction who presented to the hospital on 9/11 for the evaluation of infected peritoneal dialysis cathter. Her nephrologist is [name] who sent her in for an evaluation. She was discharged home the following day and re-admitted on 9/13 to the ED and suffered a cardiac arrest. She was intubated and started on CRRT given multiple electrolyte abnormalities and worsening renal function.  Her plan to remove peritoneal dialysis got delayed due to death of her fianc.  She presented to ER on 9/13 with severe shortness of breath and later became unresponsive and have an acute sudden cardiac arrest requiring resuscitation and she was placed on ventilator. She was extubated on 11/25/2021.  Developed another severe hypoxia with flash pulmonary edema on 11/26/2021 requiring BiPAP which was thought to be due to volume overload. Respiratory status improved with dialysis.  She also received IV Lasix.  Patient also received 2 days of CRRT while in ICU.  Now doing regular dialysis. Her infected PD catheter was removed by general surgery in OR on 11/22/2021.  Catheter tip growing MRSA.  Blood cultures remain negative.  Patient also received 1 unit of PRBC for anemia with appropriate response.  Echocardiogram done on 9/16 shows concern of HFrEF with EF of 40 to 45%, indeterminate diastolic parameters, normal right ventricular systolic function.  Cardiology was consulted, patient needs ischemic work-up.  TRH to resume care on 11/26/2021.  9/20: Cardiology saw her and  right and left cardiac catheterization is  scheduled for tomorrow.  Repeat troponin continued to rise, currently at 4204, cardiology started her on heparin infusion.  No chest pain. Permanent tunneled catheter was placed by vascular surgery yesterday.  Psych was consulted for concern of methadone withdrawal, apparently she was taking quite high dose at [facility].  This started her on a low-dose of 20 mg daily, patient with prolonged QTc and history of recent cardiac arrest, also on dialysis. Hemoglobin with 1 unit drop to 8.8 today.  Leukocytosis resolved.     Assessment and Plan: * Cardiac arrest [facility]) Sudden cardiac arrest s/p CPR and vent support.  Extubated on 11/25/2021  Troponin elevated which peaked at 532, can be due to demand ischemia secondary to cardiac arrest.  No obvious reason. Echocardiogram with EF of 40 to 45%, global hypokinesis and indeterminate diastolic function.  Cardiology was consulted from ICU for ischemic work-up. Now also concern of NSTEMI so she was started on heparin infusion. -Going for right and left cardiac catheterization tomorrow     Acute HFrEF (heart failure with reduced ejection fraction) (HCC) In the setting of sudden cardiac arrest.Recurrent pulmonary edema which improved with IV Lasix along with dialysis. -Cardiology was consulted. -Going for right and left cardiac catheterization tomorrow  Severe sepsis ([place]) Secondary to infected peritoneal catheter which was removed by general surgery on 11/22/2021.  Tip culture with MRSA.  Blood cultures remain negative. 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  She was started on low-dose methadone at 20 mg as  she was not taking it for the past many days. Still having some burning sensations at her face which she attributed to methadone withdrawal. Patient also has mild QTc  prolongation. -Continue with current dose of methadone  Infection due to peritoneal dialysis catheter ([place]) See above  Toxic metabolic encephalopathy Most likely secondary to sepsis and sudden cardiac arrest.  Resolved. -Continue to monitor  ESRD (end stage renal disease) on dialysis [facility]) Patient was initially doing peritoneal dialysis, catheter was removed on 11/22/2021 for concern of infection.  Currently getting dialysis with temporary HD catheter which is being switched to permanent catheter today. Received dialysis today with removal of 3.5 L of fluid. -Nephrology is on board -Continue with hemodialysis  Acute respiratory failure with hypoxia [facility]) Patient initially required ventilator support as an attempt for CPR with cardiac arrest.  Recurrent flash pulmonary edema with acute HFrEF. Developed another episode this morning requiring BiPAP, improved with dialysis. -Continue to monitor -Continue with supplemental oxygen-wean as tolerated.  No baseline oxygen use.  Pleural effusion on right Most likely secondary to heart failure with recurrent pulmonary edema. S/p thoracentesis on 9/17. -Continue with supportive care  HTN (hypertension) Blood pressure mildly elevated. Also had tachycardia. Amlodipine and labetalol were listed in her chart but apparently she was not taking them. -Start her on low-dose metoprolol. -Continue to monitor  -Patient will need ACE inhibitor or ARB based on acute HFrEF, defer that decision to cardiology   Hypothyroidism TSH was elevated in May 2023 at 14.53 with low free T4 at 0.7.  Patient was apparently started on low-dose Synthroid which she was not taking for about a month.  Repeat TSH with marked elevation at 74.02 and decrease free T4 at 0.44 Patient was restarted on home dose yesterday. -Increasing the home dose to 50 -Patient will need repeat labs in 3 to 4 weeks by PCP     Subjective: Patient was seen and examined today.  Denies any  chest pain.  She was complaining about burning sensations involving the face which she attributes to methadone withdrawal.  Physical Exam: Vitals:   11/27/21 1000 11/27/21 1350 11/27/21 1400 11/27/21 1500  BP: (!) 148/94 (!) 146/81 136/89   Pulse: (!) 126 99 98 (!) 120  Resp: (!) 24 (!) 23 20 (!) 22  Temp: 99.1 F (37.3 C)   (!) 97.3 F (36.3 C)  TempSrc:      SpO2: 96% 97% 100% 97%  Weight:      Height:       General.  Well-developed lady, in no acute distress. Pulmonary.  Lungs clear bilaterally, normal respiratory effort. CV.  Regular rate and rhythm, no JVD, rub or murmur. Abdomen.  Soft, nontender, nondistended, BS positive. CNS.  Alert and oriented .  No focal neurologic deficit. Extremities.  No edema, no cyanosis, pulses intact and symmetrical. Psychiatry.  Judgment and insight appears normal.  Data Reviewed: Prior data reviewed  Family Communication: Discussed with patient  Disposition: Status is: Inpatient Remains inpatient appropriate because: Severity of illness   Planned Discharge Destination: Home  DVT prophylaxis.  Heparin infusion Time spent: 50 minutes  This record has been created using Systems analyst. Errors have been sought and corrected,but may not always be located. Such creation errors do not reflect on the standard of care.  Author: Lorella Nimrod, MD 11/27/2021 5:03 PM  For on call review [email or web address].

## 2021-11-27 NOTE — Evaluation (Signed)
Physical Therapy Evaluation Patient Details Name: Nichole Wiley MRN: [id number] DOB: May 26, 1980 Today's Date: 11/27/2021  History of Present Illness  Pt admitted for cardiac arrest with complaints of severe SOB and became unresponsive with cardiac arrest with intubation 9/13-9/17. History includes ESRD, chronic pain, DM, HTN, HLD.  Clinical Impression  Pt is a pleasant 41 year old female who was admitted for cardiac arrest. Pt performs transfers and ambulation with cga and IV pole. Pt demonstrates deficits with endurance/mobility. Would benefit from skilled PT to address above deficits and promote optimal return to PLOF. Will keep on caseload while admitted, however anticipate no further needs post acute care. Plans for heart cath next date.     Recommendations for follow up therapy are one component of a multi-disciplinary discharge planning process, led by the attending physician.  Recommendations may be updated based on patient status, additional functional criteria and insurance authorization.  Follow Up Recommendations No PT follow up      Assistance Recommended at Discharge PRN  Patient can return home with the following  A little help with walking and/or transfers    Equipment Recommendations None recommended by PT  Recommendations for Other Services       Functional Status Assessment Patient has had a recent decline in their functional status and demonstrates the ability to make significant improvements in function in a reasonable and predictable amount of time.     Precautions / Restrictions Precautions Precautions: Fall Restrictions Weight Bearing Restrictions: No      Mobility  Bed Mobility               General bed mobility comments: reports she was able to get up with min assist from nursing staff. received up in recliner    Transfers Overall transfer level: Needs assistance Equipment used: 1 person hand held assist Transfers: Sit to/from Stand Sit to  Stand: Min guard           General transfer comment: safe technique with upright posture. Once standing improved balance and able to stand with supervision    Ambulation/Gait Ambulation/Gait assistance: Min guard Gait Distance (Feet): 60 Feet Assistive device: IV Pole Gait Pattern/deviations: Step-through pattern       General Gait Details: ambulated short distance in room and hallway. IV pole used with slightly wide BOS. HR increased to 121bpm.  Stairs            Wheelchair Mobility    Modified Rankin (Stroke Patients Only)       Balance Overall balance assessment: Needs assistance Sitting-balance support: Feet supported, Bilateral upper extremity supported Sitting balance-Leahy Scale: Good     Standing balance support: Bilateral upper extremity supported Standing balance-Leahy Scale: Fair                               Pertinent Vitals/Pain Pain Assessment Pain Assessment: No/denies pain    Home Living Family/patient expects to be discharged to:: Private residence Living Arrangements: Parent (mom) Available Help at Discharge: Family;Available 24 hours/day Type of Home: House Home Access: Stairs to enter Entrance Stairs-Rails: Can reach both Entrance Stairs-Number of Steps: 2   Home Layout: One level Home Equipment: Conservation officer, nature (2 wheels) Additional Comments: did have WC but it is currently broken    Prior Function Prior Level of Function : Independent/Modified Independent             Mobility Comments: reports indep, however was using WC when her  legs became too swollen from retaining fluid ADLs Comments: indep     Hand Dominance        Extremity/Trunk Assessment   Upper Extremity Assessment Upper Extremity Assessment: Overall WFL for tasks assessed    Lower Extremity Assessment Lower Extremity Assessment: Overall WFL for tasks assessed       Communication   Communication: No difficulties  Cognition  Arousal/Alertness: Awake/alert Behavior During Therapy: WFL for tasks assessed/performed Overall Cognitive Status: Within Functional Limits for tasks assessed                                          General Comments      Exercises     Assessment/Plan    PT Assessment Patient needs continued PT services  PT Problem List Decreased strength;Decreased activity tolerance;Decreased balance;Decreased mobility       PT Treatment Interventions Gait training;DME instruction;Therapeutic exercise;Balance training    PT Goals (Current goals can be found in the Care Plan section)  Acute Rehab PT Goals Patient Stated Goal: to get stronger PT Goal Formulation: With patient Time For Goal Achievement: 12/11/21 Potential to Achieve Goals: Good    Frequency Min 2X/week     Co-evaluation               AM-PAC PT "6 Clicks" Mobility  Outcome Measure Help needed turning from your back to your side while in a flat bed without using bedrails?: None Help needed moving from lying on your back to sitting on the side of a flat bed without using bedrails?: None Help needed moving to and from a bed to a chair (including a wheelchair)?: None Help needed standing up from a chair using your arms (e.g., wheelchair or bedside chair)?: None Help needed to walk in hospital room?: None Help needed climbing 3-5 steps with a railing? : None 6 Click Score: 24    End of Session   Activity Tolerance: Patient tolerated treatment well Patient left: in chair Nurse Communication: Mobility status PT Visit Diagnosis: Unsteadiness on feet (R26.81)    Time: 1444-1510 PT Time Calculation (min) (ACUTE ONLY): 26 min   Charges:   PT Evaluation $PT Eval Low Complexity: 1 Low PT Treatments $Gait Training: 8-22 mins        Greggory Stallion, PT, DPT, GCS [phone]   Hulan Szumski 11/27/2021, 5:07 PM

## 2021-11-27 NOTE — Assessment & Plan Note (Signed)
TSH was elevated in May 2023 at 14.53 with low free T4 at 0.7.  Patient was apparently started on low-dose Synthroid which she was not taking for about a month.  Repeat TSH with marked elevation at 74.02 and decrease free T4 at 0.44 Patient was restarted on home dose yesterday. -Increasing the home dose to 50 -Patient will need repeat labs in 3 to 4 weeks by PCP

## 2021-11-27 NOTE — Progress Notes (Signed)
Rounding Note    Patient Name: Nichole Wiley Date of Encounter: 11/27/2021  [place] Cardiologist: None   Subjective   Patient seen on AM rounds.  Current chest pain.  States that shortness of breath is better she is not requiring oxygen therapy this morning.  -2.7 L in the last 24 hours.  She is on scheduled for dialysis today.  Remains tachycardic.  CTA of the chest negative for PE done yesterday evening.  Tentatively scheduled for right and left heart catheterization on 11/28/2021.  Inpatient Medications    Scheduled Meds:  aspirin EC  81 mg Oral Daily   atorvastatin  40 mg Oral Daily   Chlorhexidine Gluconate Cloth  6 each Topical Q0600   epoetin (EPOGEN/PROCRIT) injection  4,000 Units Intravenous Q T,Th,Sa-HD   feeding supplement  237 mL Oral TID BM   insulin aspart  0-9 Units Subcutaneous Q4H   [START ON 11/28/2021] levothyroxine  50 mcg Oral Q0600   linezolid  600 mg Oral Q12H   methadone  20 mg Oral Daily   metoprolol tartrate  25 mg Oral BID   multivitamin  1 tablet Per Tube QHS   pantoprazole  40 mg Oral Daily   sodium chloride flush  3 mL Intravenous Q12H   Continuous Infusions:  sodium chloride     sodium chloride Stopped (11/26/21 1445)   heparin 1,250 Units/hr (11/27/21 1302)   PRN Meds: sodium chloride, acetaminophen, docusate sodium, fentaNYL (SUBLIMAZE) injection, labetalol, ondansetron (ZOFRAN) IV, mouth rinse, polyethylene glycol, sodium chloride flush   Vital Signs    Vitals:   11/27/21 1000 11/27/21 1350 11/27/21 1400 11/27/21 1500  BP: (!) 148/94 (!) 146/81 136/89   Pulse: (!) 126 99 98 (!) 120  Resp: (!) 24 (!) 23 20 (!) 22  Temp: 99.1 F (37.3 C)   (!) 97.3 F (36.3 C)  TempSrc:      SpO2: 96% 97% 100% 97%  Weight:      Height:        Intake/Output Summary (Last 24 hours) at 11/27/2021 1623 Last data filed at 11/27/2021 1000 Gross per 24 hour  Intake 240.88 ml  Output 110 ml  Net 130.88 ml      11/27/2021    5:19 AM  11/26/2021   12:32 PM 11/26/2021    4:17 AM  Last 3 Weights  Weight (lbs) 167 lb 15.9 oz 176 lb 5.9 oz 175 lb 14.8 oz  Weight (kg) 76.2 kg 80 kg 79.8 kg      Telemetry    Sinus tach 120- Personally Reviewed  ECG    No new tracings- Personally Reviewed  Physical Exam   GEN: No acute distress.  Able to lie back in bed Neck: No JVD Cardiac: RRR, cardiac, no murmurs, rubs, or gallops.  Respiratory: Clear in the upper lobes diminished in the bases to auscultation bilaterally.  Rations are unlabored at rest on room air this morning GI: Soft, nontender, non-distended  MS: 1+ edema; No deformity. Neuro:  Nonfocal  Psych: Normal affect   Labs    High Sensitivity Troponin:   Recent Labs  Lab 11/21/21 1045 11/26/21 1515 11/27/21 0012 11/27/21 0427 11/27/21 1155  TROPONINIHS 346* 2,189* 3,504* 4,204* 2,534*     Chemistry Recent Labs  Lab 11/20/21 1735 11/20/21 2155 11/25/21 0427 11/26/21 0515 11/27/21 0012 11/27/21 0427  NA 133*   < > 137 138  --  136  K 5.1   < > 3.8 3.7  --  3.4*  CL 96*   < > 97* 97*  --  98  CO2 18*   < > 25 24  --  27  GLUCOSE 149*   < > 111* 134*  --  104*  BUN 70*   < > 22* 20  --  20  CREATININE 15.66*   < > 4.39* 3.76*  --  3.38*  CALCIUM 10.1   < > 7.7* 8.2*  --  8.2*  MG  --    < > 2.0 2.2 1.8  --   PROT 6.3*  --   --   --   --   --   ALBUMIN 2.5*   < > 1.9* 2.3*  --  2.1*  AST 43*  --   --   --   --   --   ALT 23  --   --   --   --   --   ALKPHOS 144*  --   --   --   --   --   BILITOT 0.3  --   --   --   --   --   GFRNONAA 3*   < > 12* 15*  --  17*  ANIONGAP 19*   < > 15 17*  --  11   < > = values in this interval not displayed.    Lipids  Recent Labs  Lab 11/26/21 1515  CHOL 321*  TRIG 160*  HDL 50  LDLCALC 239*  CHOLHDL 6.4    Hematology Recent Labs  Lab 11/25/21 0427 11/26/21 0515 11/27/21 0012  WBC 16.0* 15.7* 9.9  RBC 3.26* 3.49* 3.13*  HGB 9.3* 9.9* 8.8*  HCT 29.4* 32.4* 29.1*  MCV 90.2 92.8 93.0  MCH 28.5  28.4 28.1  MCHC 31.6 30.6 30.2  RDW 14.6 14.8 14.9  PLT 183 228 187   Thyroid  Recent Labs  Lab 11/27/21 0012  TSH 74.020*  FREET4 0.44*    BNP Recent Labs  Lab 11/26/21 0515  BNP >4,500.0*    DDimer No results for input(s): "DDIMER" in the last 168 hours.   Radiology    CT Angio Chest Pulmonary Embolism (PE) W or WO Contrast  Result Date: 11/26/2021 CLINICAL DATA:  Insert epic diagnosis and indication EXAM: CT ANGIOGRAPHY CHEST WITH CONTRAST TECHNIQUE: Multidetector CT imaging of the chest was performed using the standard protocol during bolus administration of intravenous contrast. Multiplanar CT image reconstructions and MIPs were obtained to evaluate the vascular anatomy. RADIATION DOSE REDUCTION: This exam was performed according to the departmental dose-optimization program which includes automated exposure control, adjustment of the mA and/or kV according to patient size and/or use of iterative reconstruction technique. CONTRAST:  31m OMNIPAQUE IOHEXOL 350 MG/ML SOLN COMPARISON:  Radiograph yesterday.  Chest CTA 6 days ago FINDINGS: Cardiovascular: There are no filling defects within the pulmonary arteries to suggest pulmonary embolus. Normal caliber thoracic aorta with mild atherosclerosis. Cannot assess for dissection given phase of contrast tailored to pulmonary artery assessment. Mild cardiomegaly. Small pericardial effusion has minimally increased. Contrast refluxes into the hepatic veins and IVC. New left-sided internal jugular dialysis catheter, tip at the atrial caval junction. Gas in the overlying left supraclavicular soft tissues likely related to recent placement. Right internal jugular central venous catheter tip in the lower SVC. Mediastinum/Nodes: No suspicious adenopathy or mass. No mediastinal hemorrhage. Interval extubation. Patulous esophagus. Lungs/Pleura: Moderate bilateral pleural effusions have slightly diminished from prior exam. There is subtotal consolidation  throughout the left  lower lobe. Airspace disease in the dependent right lower lobe may represent compressive atelectasis. Perihilar consolidations have improved. Persistent but improved septal thickening and patchy ground-glass typical of pulmonary edema no pneumothorax. Upper Abdomen: Mild contrast refluxing into the hepatic veins and IVC. Vascular calcifications in the upper abdomen. No acute upper abdominal findings. Musculoskeletal: Again seen buckle fracture of the sternum. No new osseous findings. Review of the MIP images confirms the above findings. IMPRESSION: 1. No pulmonary embolus. 2. Moderate bilateral pleural effusions have slightly diminished from prior exam. 3. Persistent but diminished septal thickening and patchy ground-glass typical of resolving pulmonary edema. 4. Subtotal consolidation throughout the left lower lobe with air bronchograms, atelectasis over pneumonia. Airspace disease in the dependent right lower lobe may represent compressive atelectasis. Improving perihilar consolidations. 5. New left-sided internal jugular dialysis catheter, tip at the atrial caval junction. Gas in the overlying left supraclavicular soft tissues likely related to recent placement. 6. Mild cardiomegaly. Contrast refluxing into the hepatic veins and IVC consistent with elevated right heart pressures. Small pericardial effusion has minimally increased. Aortic Atherosclerosis (ICD10-I70.0). Electronically Signed   By: Keith Rake M.D.   On: 11/26/2021 18:25   PERIPHERAL VASCULAR CATHETERIZATION  Result Date: 11/26/2021 See surgical note for result.  DG Chest Port 1 View  Result Date: 11/25/2021 CLINICAL DATA:  Shortness of breath EXAM: PORTABLE CHEST 1 VIEW COMPARISON:  11/24/2021 FINDINGS: Temporary dialysis catheter is again noted on the right and stable. Cardiac shadow is enlarged but stable. Increased central vascular congestion is noted with airspace opacity consistent with pulmonary edema. Small  left effusion is noted. No bony abnormality is noted. IMPRESSION: Changes consistent with vascular congestion and pulmonary edema. The overall appearance is stable from the previous day. Electronically Signed   By: Inez Catalina M.D.   On: 11/25/2021 23:45    Cardiac Studies  TTE 11/23/21 1. Left ventricular ejection fraction, by estimation, is 40 to 45%. The  left ventricle has mildly decreased function. The left ventricle  demonstrates global hypokinesis. Left ventricular diastolic parameters are  indeterminate. Elevated left ventricular  end-diastolic pressure.   2. Right ventricular systolic function is normal. The right ventricular  size is normal.   3. Large pleural effusion in the left lateral region.   4. The mitral valve is normal in structure. Trivial mitral valve  regurgitation. No evidence of mitral stenosis.   5. The aortic valve is tricuspid. There is moderate calcification of the  aortic valve. Aortic valve regurgitation is not visualized. Aortic valve  sclerosis/calcification is present, without any evidence of aortic  stenosis. Aortic valve area, by VTI  measures 2.39 cm. Aortic valve mean gradient measures 3.0 mmHg. Aortic  valve Vmax measures 1.23 m/s.   6. The inferior vena cava is dilated in size with <50% respiratory  variability, suggesting right atrial pressure of 15 mmHg.   Patient Profile     42 y.o. female with a history of chronic kidney disease who had previously been on peritoneal dialysis, had an infected catheter and had to be changed to hemodialysis, chronic pain syndrome, pelvic floor dysfunction, prior history of opioid addiction, has been seen and evaluated for elevated troponin status postcardiac arrest.  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 Ellyn Hack -Daily weight, INO, low-sodium diet  End-stage renal disease -Currently on hemodialysis -Original dialysis catheter infection with removal by general surgery -Treatment per nephrology  Hypertension -Blood pressure 148/94 -Continue metoprolol 25 mg twice daily and as needed labetalol -Vital signs per unit protocol  5.   Hyperlipidemia -LDL of 180, not at goal -Continue atorvastatin 40 mg daily  For questions or updates, please contact [facility] Please consult [email or web address] for contact info under        Signed, Dorotea Hand, NP  11/27/2021, 4:23 PM

## 2021-11-27 NOTE — Assessment & Plan Note (Signed)
Sudden cardiac arrest s/p CPR and vent support.  Extubated on 11/25/2021  Troponin elevated which peaked at 532, can be due to demand ischemia secondary to cardiac arrest.  No obvious reason. Echocardiogram with EF of 40 to 45%, global hypokinesis and indeterminate diastolic function.  Cardiology was consulted from ICU for ischemic work-up. Now also concern of NSTEMI so she was started on heparin infusion. -Going for right and left cardiac catheterization tomorrow

## 2021-11-27 NOTE — Assessment & Plan Note (Signed)
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  She was started on low-dose methadone at 20 mg as she was not taking it for the past many days. Still having some burning sensations at her face which she attributed to methadone withdrawal. Patient also has mild QTc prolongation. -Continue with current dose of methadone

## 2021-11-28 ENCOUNTER — Encounter
Admission: EM | Discharge status: Against Medical Advice | Payer: Self-pay | Source: Home / Self Care | Attending: Internal Medicine

## 2021-11-28 ENCOUNTER — Ambulatory Visit: Payer: Medicaid Other | Admitting: Surgery

## 2021-11-28 DIAGNOSIS — I469 Cardiac arrest, cause unspecified: Secondary | ICD-10-CM | POA: Diagnosis not present

## 2021-11-28 DIAGNOSIS — I214 Non-ST elevation (NSTEMI) myocardial infarction: Secondary | ICD-10-CM

## 2021-11-28 DIAGNOSIS — N186 End stage renal disease: Secondary | ICD-10-CM | POA: Diagnosis not present

## 2021-11-28 DIAGNOSIS — I420 Dilated cardiomyopathy: Secondary | ICD-10-CM

## 2021-11-28 DIAGNOSIS — I2510 Atherosclerotic heart disease of native coronary artery without angina pectoris: Secondary | ICD-10-CM

## 2021-11-28 DIAGNOSIS — J9601 Acute respiratory failure with hypoxia: Secondary | ICD-10-CM | POA: Diagnosis not present

## 2021-11-28 DIAGNOSIS — I5021 Acute systolic (congestive) heart failure: Secondary | ICD-10-CM | POA: Diagnosis not present

## 2021-11-28 HISTORY — PX: RIGHT/LEFT HEART CATH AND CORONARY ANGIOGRAPHY: CATH118266

## 2021-11-28 LAB — GLUCOSE, CAPILLARY
Glucose-Capillary: 203 mg/dL — ABNORMAL HIGH (ref 70–99)
Glucose-Capillary: 135 mg/dL — ABNORMAL HIGH (ref 70–99)
Glucose-Capillary: 114 mg/dL — ABNORMAL HIGH (ref 70–99)
Glucose-Capillary: 114 mg/dL — ABNORMAL HIGH (ref 70–99)
Glucose-Capillary: 126 mg/dL — ABNORMAL HIGH (ref 70–99)
Glucose-Capillary: 205 mg/dL — ABNORMAL HIGH (ref 70–99)
Glucose-Capillary: 201 mg/dL — ABNORMAL HIGH (ref 70–99)

## 2021-11-28 LAB — CBC
HCT: 30.4 % — ABNORMAL LOW (ref 36.0–46.0)
Hemoglobin: 9.4 g/dL — ABNORMAL LOW (ref 12.0–15.0)
MCH: 28.7 pg (ref 26.0–34.0)
MCHC: 30.9 g/dL (ref 30.0–36.0)
MCV: 92.7 fL (ref 80.0–100.0)
Platelets: 196 10*3/uL (ref 150–400)
RBC: 3.28 MIL/uL — ABNORMAL LOW (ref 3.87–5.11)
RDW: 14.7 % (ref 11.5–15.5)
WBC: 10.1 10*3/uL (ref 4.0–10.5)
nRBC: 0 % (ref 0.0–0.2)

## 2021-11-28 LAB — RENAL FUNCTION PANEL
Albumin: 2.4 g/dL — ABNORMAL LOW (ref 3.5–5.0)
Anion gap: 11 (ref 5–15)
BUN: 30 mg/dL — ABNORMAL HIGH (ref 6–20)
CO2: 26 mmol/L (ref 22–32)
Calcium: 8.4 mg/dL — ABNORMAL LOW (ref 8.9–10.3)
Chloride: 97 mmol/L — ABNORMAL LOW (ref 98–111)
Creatinine, Ser: 4.84 mg/dL — ABNORMAL HIGH (ref 0.44–1.00)
GFR, Estimated: 11 mL/min — ABNORMAL LOW (ref >60)
Glucose, Bld: 114 mg/dL — ABNORMAL HIGH (ref 70–99)
Phosphorus: 3.2 mg/dL (ref 2.5–4.6)
Potassium: 2.8 mmol/L — ABNORMAL LOW (ref 3.5–5.1)
Sodium: 134 mmol/L — ABNORMAL LOW (ref 135–145)

## 2021-11-28 LAB — BODY FLUID CULTURE W GRAM STAIN
Culture: NO GROWTH
Gram Stain: NONE SEEN

## 2021-11-28 LAB — TROPONIN I (HIGH SENSITIVITY): Troponin I (High Sensitivity): 3006 ng/L (ref <18)

## 2021-11-28 LAB — CHOLESTEROL, BODY FLUID: Cholesterol, Fluid: 29 mg/dL

## 2021-11-28 LAB — MAGNESIUM: Magnesium: 1.9 mg/dL (ref 1.7–2.4)

## 2021-11-28 LAB — HEPARIN LEVEL (UNFRACTIONATED): Heparin Unfractionated: 0.32 IU/mL (ref 0.30–0.70)

## 2021-11-28 SURGERY — RIGHT/LEFT HEART CATH AND CORONARY ANGIOGRAPHY
Anesthesia: Moderate Sedation

## 2021-11-28 MED ORDER — HEPARIN (PORCINE) IN NACL 1000-0.9 UT/500ML-% IV SOLN
INTRAVENOUS | Status: AC
  Filled 2021-11-28: qty 1000

## 2021-11-28 MED ORDER — LIDOCAINE HCL 1 % IJ SOLN
INTRAMUSCULAR | Status: AC
  Filled 2021-11-28: qty 20

## 2021-11-28 MED ORDER — CARVEDILOL 12.5 MG PO TABS
12.5000 mg | ORAL_TABLET | Freq: Two times a day (BID) | ORAL | Status: DC
  Administered 2021-11-28 – 2021-11-29 (×2): 12.5 mg via ORAL
  Filled 2021-11-28 (×3): qty 1

## 2021-11-28 MED ORDER — ASPIRIN 81 MG PO CHEW
CHEWABLE_TABLET | ORAL | Status: AC
  Administered 2021-11-28: 81 mg via ORAL
  Filled 2021-11-28: qty 1

## 2021-11-28 MED ORDER — SODIUM CHLORIDE 0.9 % IV SOLN: 250.0000 mL | INTRAVENOUS | Status: DC | PRN

## 2021-11-28 MED ORDER — ASPIRIN 81 MG PO CHEW: 81.0000 mg | CHEWABLE_TABLET | ORAL | Status: AC

## 2021-11-28 MED ORDER — SODIUM CHLORIDE 0.9% FLUSH: 3.0000 mL | Freq: Two times a day (BID) | INTRAVENOUS | Status: DC

## 2021-11-28 MED ORDER — IOHEXOL 300 MG/ML  SOLN
INTRAMUSCULAR | Status: DC | PRN
  Administered 2021-11-28: 55 mL

## 2021-11-28 MED ORDER — FENTANYL CITRATE (PF) 100 MCG/2ML IJ SOLN
INTRAMUSCULAR | Status: AC
  Filled 2021-11-28: qty 2

## 2021-11-28 MED ORDER — HEPARIN SODIUM (PORCINE) 1000 UNIT/ML IJ SOLN
INTRAMUSCULAR | Status: AC
  Filled 2021-11-28: qty 10

## 2021-11-28 MED ORDER — EPOETIN ALFA 4000 UNIT/ML IJ SOLN
INTRAMUSCULAR | Status: AC
  Administered 2021-11-28: 4000 [IU]
  Filled 2021-11-28: qty 1

## 2021-11-28 MED ORDER — MIDAZOLAM HCL 2 MG/2ML IJ SOLN
INTRAMUSCULAR | Status: DC | PRN
  Administered 2021-11-28 (×2): 1 mg via INTRAVENOUS

## 2021-11-28 MED ORDER — HYDRALAZINE HCL 20 MG/ML IJ SOLN: 10.0000 mg | INTRAMUSCULAR | Status: AC | PRN

## 2021-11-28 MED ORDER — NITROGLYCERIN 1 MG/10 ML FOR IR/CATH LAB
INTRA_ARTERIAL | Status: DC | PRN
  Administered 2021-11-28 (×2): 200 ug via INTRACORONARY

## 2021-11-28 MED ORDER — METOPROLOL TARTRATE 5 MG/5ML IV SOLN
INTRAVENOUS | Status: AC
  Filled 2021-11-28: qty 5

## 2021-11-28 MED ORDER — HEPARIN (PORCINE) IN NACL 1000-0.9 UT/500ML-% IV SOLN
INTRAVENOUS | Status: DC | PRN
  Administered 2021-11-28: 1000 mL

## 2021-11-28 MED ORDER — SODIUM CHLORIDE 0.9% FLUSH
3.0000 mL | Freq: Two times a day (BID) | INTRAVENOUS | Status: DC
  Administered 2021-11-28 – 2021-11-29 (×3): 3 mL via INTRAVENOUS

## 2021-11-28 MED ORDER — MIDAZOLAM HCL 2 MG/2ML IJ SOLN
INTRAMUSCULAR | Status: AC
  Filled 2021-11-28: qty 2

## 2021-11-28 MED ORDER — METOPROLOL TARTRATE 5 MG/5ML IV SOLN
INTRAVENOUS | Status: DC | PRN
  Administered 2021-11-28: 5 mg via INTRAVENOUS

## 2021-11-28 MED ORDER — CARVEDILOL 12.5 MG PO TABS: 12.5000 mg | ORAL_TABLET | Freq: Two times a day (BID) | ORAL | Status: DC

## 2021-11-28 MED ORDER — SODIUM CHLORIDE 0.9 % IV SOLN: INTRAVENOUS | Status: DC

## 2021-11-28 MED ORDER — SODIUM CHLORIDE 0.9% FLUSH: 3.0000 mL | INTRAVENOUS | Status: DC | PRN

## 2021-11-28 MED ORDER — FENTANYL CITRATE (PF) 100 MCG/2ML IJ SOLN
INTRAMUSCULAR | Status: DC | PRN
  Administered 2021-11-28: 25 ug via INTRAVENOUS
  Administered 2021-11-28: 50 ug via INTRAVENOUS
  Administered 2021-11-28: 25 ug via INTRAVENOUS

## 2021-11-28 MED ORDER — LIDOCAINE HCL (PF) 1 % IJ SOLN
INTRAMUSCULAR | Status: DC | PRN
  Administered 2021-11-28: 20 mL

## 2021-11-28 SURGICAL SUPPLY — 11 items
CANNULA 5F STIFF (CANNULA) IMPLANT
CATH INFINITI 5FR MULTPACK ANG (CATHETERS) IMPLANT
CATH SWAN GANZ 7F STRAIGHT (CATHETERS) IMPLANT
DEVICE CLOSURE MYNXGRIP 5F (Vascular Products) IMPLANT
PACK CARDIAC CATH (CUSTOM PROCEDURE TRAY) ×1 IMPLANT
PROTECTION STATION PRESSURIZED (MISCELLANEOUS) ×1
SET ATX SIMPLICITY (MISCELLANEOUS) IMPLANT
SHEATH AVANTI 5FR X 11CM (SHEATH) IMPLANT
SHEATH AVANTI 7FRX11 (SHEATH) IMPLANT
STATION PROTECTION PRESSURIZED (MISCELLANEOUS) IMPLANT
WIRE GUIDERIGHT .035X150 (WIRE) IMPLANT

## 2021-11-28 NOTE — Assessment & Plan Note (Signed)
Secondary to infected peritoneal catheter which was removed by general surgery on 11/22/2021.  Tip culture with MRSA.  Blood cultures remain negative. -Continue linezolid -ID on board

## 2021-11-28 NOTE — Progress Notes (Signed)
No orders placed regarding scheduled heart Cath today. Patient was kept NPO per night shift report to this RN since midnight. Morning medications were held as well per night shift RN report. Patient verbalized anxiety related to having heart cath today. I encouraged patient to discuss concerns with MD prior to cath and offered reassurance of follow up care when patient returned to unit following procedure.

## 2021-11-28 NOTE — Progress Notes (Signed)
PT Cancellation Note  Patient Details Name: Nichole Wiley MRN: [id number] DOB: 1980-10-13   Cancelled Treatment:    Reason Eval/Treat Not Completed: Other (comment). Off floor for procedure. Will re-attempt another time.   Allee Busk 11/28/2021, 11:06 AM Greggory Stallion, PT, DPT, [facility] [phone]

## 2021-11-28 NOTE — Assessment & Plan Note (Signed)
Patient with worsening troponin, concern of NSTEMI.  No chest pain.  She was started on heparin infusion by cardiology. -Right and left cardiac catheterization today -Being managed by cardiology

## 2021-11-28 NOTE — Assessment & Plan Note (Signed)
In the setting of sudden cardiac arrest.Recurrent pulmonary edema which improved with IV Lasix along with dialysis. -Cardiology was consulted. -Going for right and left cardiac catheterization today

## 2021-11-28 NOTE — Progress Notes (Signed)
Cardiology Progress Note   Patient Name: Nichole Wiley Date of Encounter: 11/28/2021  Primary Cardiologist: Nelva Bush, MD  Subjective   No chest pain or sob.  Anxious re: pending cath today.  Currently receiving HD and tolerating well.  Inpatient Medications    Scheduled Meds:  heparin sodium (porcine)       aspirin EC  81 mg Oral Daily   atorvastatin  40 mg Oral Daily   Chlorhexidine Gluconate Cloth  6 each Topical Q0600   epoetin (EPOGEN/PROCRIT) injection  4,000 Units Intravenous Q T,Th,Sa-HD   feeding supplement  237 mL Oral TID BM   insulin aspart  0-9 Units Subcutaneous Q4H   levothyroxine  50 mcg Oral Q0600   linezolid  600 mg Oral Q12H   methadone  20 mg Oral Daily   metoprolol tartrate  25 mg Oral BID   multivitamin  1 tablet Per Tube QHS   pantoprazole  40 mg Oral Daily   sodium chloride flush  3 mL Intravenous Q12H   Continuous Infusions:  sodium chloride     sodium chloride Stopped (11/26/21 1445)   heparin 1,250 Units/hr (11/28/21 0800)   PRN Meds: heparin sodium (porcine), sodium chloride, acetaminophen, docusate sodium, fentaNYL (SUBLIMAZE) injection, labetalol, ondansetron (ZOFRAN) IV, mouth rinse, polyethylene glycol, sodium chloride flush   Vital Signs    Vitals:   11/28/21 0845 11/28/21 0900 11/28/21 0915 11/28/21 0930  BP: 123/84 (!) 143/96 (!) 146/101 (!) 145/99  Pulse: (!) 113 (!) 116 (!) 116 (!) 119  Resp: (!) 30 19 (!) 22 18  Temp:      TempSrc:      SpO2: 96% 99% 100% 100%  Weight:      Height:        Intake/Output Summary (Last 24 hours) at 11/28/2021 0946 Last data filed at 11/28/2021 0800 Gross per 24 hour  Intake 389.31 ml  Output 40 ml  Net 349.31 ml   Filed Weights   11/27/21 0519 11/28/21 0500 11/28/21 0752  Weight: 76.2 kg 77 kg 76 kg    Physical Exam   GEN: Well nourished, well developed, in no acute distress.  HEENT: Grossly normal.  Neck: Supple, no JVD, carotid bruits, or masses. Cardiac: RRR, no  murmurs, rubs, or gallops. No clubbing, cyanosis, 1+ bilat LE edema.  Radials 2+, DP/PT 2+ and equal bilaterally.  Respiratory:  Respirations regular and unlabored, diminished breath sounds @ bilat bases. GI: Soft, nontender, nondistended, BS + x 4. MS: no deformity or atrophy. Skin: warm and dry, no rash. Neuro:  Strength and sensation are intact. Psych: AAOx3.  Normal affect.  Labs    Chemistry Recent Labs  Lab 11/26/21 0515 11/27/21 0427 11/28/21 0453  NA 138 136 134*  K 3.7 3.4* 2.8*  CL 97* 98 97*  CO2 '24 27 26  '$ GLUCOSE 134* 104* 114*  BUN 20 20 30*  CREATININE 3.76* 3.38* 4.84*  CALCIUM 8.2* 8.2* 8.4*  ALBUMIN 2.3* 2.1* 2.4*  GFRNONAA 15* 17* 11*  ANIONGAP 17* 11 11     Hematology Recent Labs  Lab 11/26/21 0515 11/27/21 0012 11/28/21 0453  WBC 15.7* 9.9 10.1  RBC 3.49* 3.13* 3.28*  HGB 9.9* 8.8* 9.4*  HCT 32.4* 29.1* 30.4*  MCV 92.8 93.0 92.7  MCH 28.4 28.1 28.7  MCHC 30.6 30.2 30.9  RDW 14.8 14.9 14.7  PLT 228 187 196    Cardiac Enzymes  Recent Labs  Lab 11/26/21 1515 11/27/21 0012 11/27/21 0427 11/27/21 1155 11/28/21 0453  TROPONINIHS 2,189* 3,504* 4,204* 2,534* 3,006*      BNP    Component Value Date/Time   BNP >4,500.0 (H) 11/26/2021 0515   BNP 1,209 (H) 09/20/2021 1445   Lipids  Lab Results  Component Value Date   CHOL 321 (H) 11/26/2021   HDL 50 11/26/2021   LDLCALC 239 (H) 11/26/2021   TRIG 160 (H) 11/26/2021   CHOLHDL 6.4 11/26/2021    HbA1c  Lab Results  Component Value Date   HGBA1C 4.8 10/01/2021    Radiology    CT Angio Chest Pulmonary Embolism (PE) W or WO Contrast  Result Date: 11/26/2021 CLINICAL DATA:  Insert epic diagnosis and indication EXAM: CT ANGIOGRAPHY CHEST WITH CONTRAST TECHNIQUE: Multidetector CT imaging of the chest was performed using the standard protocol during bolus administration of intravenous contrast. Multiplanar CT image reconstructions and MIPs were obtained to evaluate the vascular anatomy.  RADIATION DOSE REDUCTION: This exam was performed according to the departmental dose-optimization program which includes automated exposure control, adjustment of the mA and/or kV according to patient size and/or use of iterative reconstruction technique. CONTRAST:  20m OMNIPAQUE IOHEXOL 350 MG/ML SOLN COMPARISON:  Radiograph yesterday.  Chest CTA 6 days ago FINDINGS: Cardiovascular: There are no filling defects within the pulmonary arteries to suggest pulmonary embolus. Normal caliber thoracic aorta with mild atherosclerosis. Cannot assess for dissection given phase of contrast tailored to pulmonary artery assessment. Mild cardiomegaly. Small pericardial effusion has minimally increased. Contrast refluxes into the hepatic veins and IVC. New left-sided internal jugular dialysis catheter, tip at the atrial caval junction. Gas in the overlying left supraclavicular soft tissues likely related to recent placement. Right internal jugular central venous catheter tip in the lower SVC. Mediastinum/Nodes: No suspicious adenopathy or mass. No mediastinal hemorrhage. Interval extubation. Patulous esophagus. Lungs/Pleura: Moderate bilateral pleural effusions have slightly diminished from prior exam. There is subtotal consolidation throughout the left lower lobe. Airspace disease in the dependent right lower lobe may represent compressive atelectasis. Perihilar consolidations have improved. Persistent but improved septal thickening and patchy ground-glass typical of pulmonary edema no pneumothorax. Upper Abdomen: Mild contrast refluxing into the hepatic veins and IVC. Vascular calcifications in the upper abdomen. No acute upper abdominal findings. Musculoskeletal: Again seen buckle fracture of the sternum. No new osseous findings. Review of the MIP images confirms the above findings. IMPRESSION: 1. No pulmonary embolus. 2. Moderate bilateral pleural effusions have slightly diminished from prior exam. 3. Persistent but  diminished septal thickening and patchy ground-glass typical of resolving pulmonary edema. 4. Subtotal consolidation throughout the left lower lobe with air bronchograms, atelectasis over pneumonia. Airspace disease in the dependent right lower lobe may represent compressive atelectasis. Improving perihilar consolidations. 5. New left-sided internal jugular dialysis catheter, tip at the atrial caval junction. Gas in the overlying left supraclavicular soft tissues likely related to recent placement. 6. Mild cardiomegaly. Contrast refluxing into the hepatic veins and IVC consistent with elevated right heart pressures. Small pericardial effusion has minimally increased. Aortic Atherosclerosis (ICD10-I70.0). Electronically Signed   By: MKeith RakeM.D.   On: 11/26/2021 18:25   PERIPHERAL VASCULAR CATHETERIZATION  Result Date: 11/26/2021 See surgical note for result.  DG Chest Port 1 View  Result Date: 11/25/2021 CLINICAL DATA:  Shortness of breath EXAM: PORTABLE CHEST 1 VIEW COMPARISON:  11/24/2021 FINDINGS: Temporary dialysis catheter is again noted on the right and stable. Cardiac shadow is enlarged but stable. Increased central vascular congestion is noted with airspace opacity consistent with pulmonary edema. Small left effusion is noted. No  bony abnormality is noted. IMPRESSION: Changes consistent with vascular congestion and pulmonary edema. The overall appearance is stable from the previous day. Electronically Signed   By: Inez Catalina M.D.   On: 11/25/2021 23:45   DG Chest Port 1 View  Result Date: 11/24/2021 CLINICAL DATA:  Short of breath post thoracentesis EXAM: PORTABLE CHEST 1 VIEW COMPARISON:  Chest 11/24/2021 FINDINGS: No pneumothorax post thoracentesis. Bilateral airspace disease compatible with pulmonary edema with mild progression. Bibasilar airspace disease left greater than right unchanged. Small bilateral effusions unchanged. Central venous catheter tip at the cavoatrial junction  unchanged. IMPRESSION: No pneumothorax post thoracentesis. Diffuse bilateral airspace disease compatible with pulmonary edema. Mild progression. Electronically Signed   By: Franchot Gallo M.D.   On: 11/24/2021 13:33   DG Chest Port 1 View  Result Date: 11/24/2021 CLINICAL DATA:  Chest pain, shortness of breath EXAM: PORTABLE CHEST 1 VIEW COMPARISON:  Previous studies including the examination of 11/23/2021 FINDINGS: There is interval removal of endotracheal tube and NG tube. Tip of right IJ central venous catheter is seen at the junction of superior vena cava and right atrium. Central pulmonary vessels are prominent. Increased interstitial markings are seen in both lungs. There is small to moderate left pleural effusion. Haziness in the right lower lung field may suggest layering of small effusion. There is no pneumothorax. IMPRESSION: Cardiomegaly. CHF. Pulmonary edema. Bilateral pleural effusions, more so on the left side. Electronically Signed   By: Elmer Picker M.D.   On: 11/24/2021 11:47    Telemetry    RSR - Personally Reviewed  Cardiac Studies   2D Echocardiogram 9.16.2023  1. Left ventricular ejection fraction, by estimation, is 40 to 45%. The  left ventricle has mildly decreased function. The left ventricle  demonstrates global hypokinesis. Left ventricular diastolic parameters are  indeterminate. Elevated left ventricular  end-diastolic pressure.   2. Right ventricular systolic function is normal. The right ventricular  size is normal.   3. Large pleural effusion in the left lateral region.   4. The mitral valve is normal in structure. Trivial mitral valve  regurgitation. No evidence of mitral stenosis.   5. The aortic valve is tricuspid. There is moderate calcification of the  aortic valve. Aortic valve regurgitation is not visualized. Aortic valve  sclerosis/calcification is present, without any evidence of aortic  stenosis. Aortic valve area, by VTI  measures 2.39 cm.  Aortic valve mean gradient measures 3.0 mmHg. Aortic  valve Vmax measures 1.23 m/s.   6. The inferior vena cava is dilated in size with <50% respiratory  variability, suggesting right atrial pressure of 15 mmHg.   Patient Profile     41 y.o. 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  EF 40-45% by echo this admission.  Assessment & Plan    1.  Cardiac Arrest/NSTEMI:  presented 9/13 w/ resp failure and cardiac arrest (unclear initial presenting rhythm).  HsTrop to 2189.  Echo w/ EF 40-45% and global HK.  No chest pain or sob.  Currently receiving HD.  Plan for diagnostic cath today.  The patient understands that risks include but are not limited to stroke (1 in 1000), death (1 in 75), kidney failure [usually temporary] (1 in 500), bleeding (1 in 200), allergic reaction [possibly serious] (  1 in 200), and agrees to proceed. .   Cont asa, statin, ? blocker.  2.  Cardiomyopathy/HFmrEF:  as above, EF 40-45% w/ glob HK.  Several episodes of flash pulm edema, though breathing currently stable.  Still w/ mild lower ext edema and diminished breath sounds/pleural effusions.  Volume mgmt per HD. Cont ? blocker - will transition to carvedilol in setting of depressed EF.  Not currently on acei/arb/arni - will reconsider post-cath.  3.  Essential HTN:  BP cont to trend up. Will transition metoprolol to carvedilol.  Consider entresto in setting of #2.  4.  HL:  LDL 180.  Cont statin rx.  5.  Acute hypoxic resp failure/bilat pleural effusions:  s/p extubation and 900 ml R thoracentesis on 9/17.  Diminished breath sounds on exam though breathing stable on RA.  6.  Anemia:  S/p 1 u prbcs.  Stable.  7.  Peritonitis:  PD catheter grew MRSA.  BC neg.   Abx per IM.  Signed, Murray Hodgkins, NP  11/28/2021, 9:46 AM    For questions or updates, please contact   Please consult [email or web address] for contact info under Cardiology/STEMI.

## 2021-11-28 NOTE — Progress Notes (Signed)
Received patient in bed to unit.  Alert and oriented.  Informed consent signed and in chart.   Treatment initiated: 0758 Treatment completed: 1130  Patient tolerated well.  Transported back to the room  Alert, without acute distress.  Hand-off given to patient's nurse. Jonnie Kind RN  Access used: left chest HD catheter  Access issues: high pressure during treatment  Total UF removed: 1887m Medication(s) given: epogen  Post HD VS: 97.9, 120, 24, 133/103, 100% RA Post HD weight: 74.7 kg    [facility]

## 2021-11-28 NOTE — Progress Notes (Signed)
SLP Cancellation Note  Patient Details Name: Nichole Wiley MRN: [id number] DOB: 01-Aug-1980   Cancelled treatment:       Reason Eval/Treat Not Completed: Medical issues which prohibited therapy (Pt off the floor for cardiac catheterization) Attempted to see pt for follow up dysphagia intervention. Pt off the floor for cardiac catheterization. SLP will follow up as pt is available and appropriate and schedule allows.   [place] Yomayra Tate Clapp  MS CCC-SLP   [place] J Clapp 11/28/2021, 10:43 AM

## 2021-11-28 NOTE — Progress Notes (Signed)
Patient alert and oriented. No signs of bleeding/hematoma at site. Patient +1 distal pulses. Patient transferred to [facility] and is sitting on side of the bed eating. IJ and foley removed. Tolerated well. Will continue to monitor.

## 2021-11-28 NOTE — Interval H&P Note (Signed)
History and Physical Interval Note:  11/28/2021 2:18 PM  Nichole Wiley  has presented today for surgery, with the diagnosis of NSTEMI and HFrEF.  The various methods of treatment have been discussed with the patient and family. After consideration of risks, benefits and other options for treatment, the patient has consented to  Procedure(s): RIGHT/LEFT HEART CATH AND CORONARY ANGIOGRAPHY (N/A) as a surgical intervention.  The patient's history has been reviewed, patient examined, no change in status, stable for surgery.  I have reviewed the patient's chart and labs.  Questions were answered to the patient's satisfaction.   .  Cath Lab Visit (complete for each Cath Lab visit)  Clinical Evaluation Leading to the Procedure:   ACS: Yes.    Non-ACS:    Anginal Classification: CCS III  Anti-ischemic medical therapy: Minimal Therapy (1 class of medications)  Non-Invasive Test Results: No non-invasive testing performed  Prior CABG: No previous CABG    Glenetta Hew

## 2021-11-28 NOTE — H&P (View-Only) (Signed)
Cardiology Progress Note   Patient Name: Nichole Wiley Date of Encounter: 11/28/2021  Primary Cardiologist: Nelva Bush, MD  Subjective   No chest pain or sob.  Anxious re: pending cath today.  Currently receiving HD and tolerating well.  Inpatient Medications    Scheduled Meds:  heparin sodium (porcine)       aspirin EC  81 mg Oral Daily   atorvastatin  40 mg Oral Daily   Chlorhexidine Gluconate Cloth  6 each Topical Q0600   epoetin (EPOGEN/PROCRIT) injection  4,000 Units Intravenous Q T,Th,Sa-HD   feeding supplement  237 mL Oral TID BM   insulin aspart  0-9 Units Subcutaneous Q4H   levothyroxine  50 mcg Oral Q0600   linezolid  600 mg Oral Q12H   methadone  20 mg Oral Daily   metoprolol tartrate  25 mg Oral BID   multivitamin  1 tablet Per Tube QHS   pantoprazole  40 mg Oral Daily   sodium chloride flush  3 mL Intravenous Q12H   Continuous Infusions:  sodium chloride     sodium chloride Stopped (11/26/21 1445)   heparin 1,250 Units/hr (11/28/21 0800)   PRN Meds: heparin sodium (porcine), sodium chloride, acetaminophen, docusate sodium, fentaNYL (SUBLIMAZE) injection, labetalol, ondansetron (ZOFRAN) IV, mouth rinse, polyethylene glycol, sodium chloride flush   Vital Signs    Vitals:   11/28/21 0845 11/28/21 0900 11/28/21 0915 11/28/21 0930  BP: 123/84 (!) 143/96 (!) 146/101 (!) 145/99  Pulse: (!) 113 (!) 116 (!) 116 (!) 119  Resp: (!) 30 19 (!) 22 18  Temp:      TempSrc:      SpO2: 96% 99% 100% 100%  Weight:      Height:        Intake/Output Summary (Last 24 hours) at 11/28/2021 0946 Last data filed at 11/28/2021 0800 Gross per 24 hour  Intake 389.31 ml  Output 40 ml  Net 349.31 ml   Filed Weights   11/27/21 0519 11/28/21 0500 11/28/21 0752  Weight: 76.2 kg 77 kg 76 kg    Physical Exam   GEN: Well nourished, well developed, in no acute distress.  HEENT: Grossly normal.  Neck: Supple, no JVD, carotid bruits, or masses. Cardiac: RRR, no  murmurs, rubs, or gallops. No clubbing, cyanosis, 1+ bilat LE edema.  Radials 2+, DP/PT 2+ and equal bilaterally.  Respiratory:  Respirations regular and unlabored, diminished breath sounds @ bilat bases. GI: Soft, nontender, nondistended, BS + x 4. MS: no deformity or atrophy. Skin: warm and dry, no rash. Neuro:  Strength and sensation are intact. Psych: AAOx3.  Normal affect.  Labs    Chemistry Recent Labs  Lab 11/26/21 0515 11/27/21 0427 11/28/21 0453  NA 138 136 134*  K 3.7 3.4* 2.8*  CL 97* 98 97*  CO2 '24 27 26  '$ GLUCOSE 134* 104* 114*  BUN 20 20 30*  CREATININE 3.76* 3.38* 4.84*  CALCIUM 8.2* 8.2* 8.4*  ALBUMIN 2.3* 2.1* 2.4*  GFRNONAA 15* 17* 11*  ANIONGAP 17* 11 11     Hematology Recent Labs  Lab 11/26/21 0515 11/27/21 0012 11/28/21 0453  WBC 15.7* 9.9 10.1  RBC 3.49* 3.13* 3.28*  HGB 9.9* 8.8* 9.4*  HCT 32.4* 29.1* 30.4*  MCV 92.8 93.0 92.7  MCH 28.4 28.1 28.7  MCHC 30.6 30.2 30.9  RDW 14.8 14.9 14.7  PLT 228 187 196    Cardiac Enzymes  Recent Labs  Lab 11/26/21 1515 11/27/21 0012 11/27/21 0427 11/27/21 1155 11/28/21 0453  TROPONINIHS 2,189* 3,504* 4,204* 2,534* 3,006*      BNP    Component Value Date/Time   BNP >4,500.0 (H) 11/26/2021 0515   BNP 1,209 (H) 09/20/2021 1445   Lipids  Lab Results  Component Value Date   CHOL 321 (H) 11/26/2021   HDL 50 11/26/2021   LDLCALC 239 (H) 11/26/2021   TRIG 160 (H) 11/26/2021   CHOLHDL 6.4 11/26/2021    HbA1c  Lab Results  Component Value Date   HGBA1C 4.8 10/01/2021    Radiology    CT Angio Chest Pulmonary Embolism (PE) W or WO Contrast  Result Date: 11/26/2021 CLINICAL DATA:  Insert epic diagnosis and indication EXAM: CT ANGIOGRAPHY CHEST WITH CONTRAST TECHNIQUE: Multidetector CT imaging of the chest was performed using the standard protocol during bolus administration of intravenous contrast. Multiplanar CT image reconstructions and MIPs were obtained to evaluate the vascular anatomy.  RADIATION DOSE REDUCTION: This exam was performed according to the departmental dose-optimization program which includes automated exposure control, adjustment of the mA and/or kV according to patient size and/or use of iterative reconstruction technique. CONTRAST:  49m OMNIPAQUE IOHEXOL 350 MG/ML SOLN COMPARISON:  Radiograph yesterday.  Chest CTA 6 days ago FINDINGS: Cardiovascular: There are no filling defects within the pulmonary arteries to suggest pulmonary embolus. Normal caliber thoracic aorta with mild atherosclerosis. Cannot assess for dissection given phase of contrast tailored to pulmonary artery assessment. Mild cardiomegaly. Small pericardial effusion has minimally increased. Contrast refluxes into the hepatic veins and IVC. New left-sided internal jugular dialysis catheter, tip at the atrial caval junction. Gas in the overlying left supraclavicular soft tissues likely related to recent placement. Right internal jugular central venous catheter tip in the lower SVC. Mediastinum/Nodes: No suspicious adenopathy or mass. No mediastinal hemorrhage. Interval extubation. Patulous esophagus. Lungs/Pleura: Moderate bilateral pleural effusions have slightly diminished from prior exam. There is subtotal consolidation throughout the left lower lobe. Airspace disease in the dependent right lower lobe may represent compressive atelectasis. Perihilar consolidations have improved. Persistent but improved septal thickening and patchy ground-glass typical of pulmonary edema no pneumothorax. Upper Abdomen: Mild contrast refluxing into the hepatic veins and IVC. Vascular calcifications in the upper abdomen. No acute upper abdominal findings. Musculoskeletal: Again seen buckle fracture of the sternum. No new osseous findings. Review of the MIP images confirms the above findings. IMPRESSION: 1. No pulmonary embolus. 2. Moderate bilateral pleural effusions have slightly diminished from prior exam. 3. Persistent but  diminished septal thickening and patchy ground-glass typical of resolving pulmonary edema. 4. Subtotal consolidation throughout the left lower lobe with air bronchograms, atelectasis over pneumonia. Airspace disease in the dependent right lower lobe may represent compressive atelectasis. Improving perihilar consolidations. 5. New left-sided internal jugular dialysis catheter, tip at the atrial caval junction. Gas in the overlying left supraclavicular soft tissues likely related to recent placement. 6. Mild cardiomegaly. Contrast refluxing into the hepatic veins and IVC consistent with elevated right heart pressures. Small pericardial effusion has minimally increased. Aortic Atherosclerosis (ICD10-I70.0). Electronically Signed   By: MKeith RakeM.D.   On: 11/26/2021 18:25   PERIPHERAL VASCULAR CATHETERIZATION  Result Date: 11/26/2021 See surgical note for result.  DG Chest Port 1 View  Result Date: 11/25/2021 CLINICAL DATA:  Shortness of breath EXAM: PORTABLE CHEST 1 VIEW COMPARISON:  11/24/2021 FINDINGS: Temporary dialysis catheter is again noted on the right and stable. Cardiac shadow is enlarged but stable. Increased central vascular congestion is noted with airspace opacity consistent with pulmonary edema. Small left effusion is noted. No  bony abnormality is noted. IMPRESSION: Changes consistent with vascular congestion and pulmonary edema. The overall appearance is stable from the previous day. Electronically Signed   By: Inez Catalina M.D.   On: 11/25/2021 23:45   DG Chest Port 1 View  Result Date: 11/24/2021 CLINICAL DATA:  Short of breath post thoracentesis EXAM: PORTABLE CHEST 1 VIEW COMPARISON:  Chest 11/24/2021 FINDINGS: No pneumothorax post thoracentesis. Bilateral airspace disease compatible with pulmonary edema with mild progression. Bibasilar airspace disease left greater than right unchanged. Small bilateral effusions unchanged. Central venous catheter tip at the cavoatrial junction  unchanged. IMPRESSION: No pneumothorax post thoracentesis. Diffuse bilateral airspace disease compatible with pulmonary edema. Mild progression. Electronically Signed   By: Franchot Gallo M.D.   On: 11/24/2021 13:33   DG Chest Port 1 View  Result Date: 11/24/2021 CLINICAL DATA:  Chest pain, shortness of breath EXAM: PORTABLE CHEST 1 VIEW COMPARISON:  Previous studies including the examination of 11/23/2021 FINDINGS: There is interval removal of endotracheal tube and NG tube. Tip of right IJ central venous catheter is seen at the junction of superior vena cava and right atrium. Central pulmonary vessels are prominent. Increased interstitial markings are seen in both lungs. There is small to moderate left pleural effusion. Haziness in the right lower lung field may suggest layering of small effusion. There is no pneumothorax. IMPRESSION: Cardiomegaly. CHF. Pulmonary edema. Bilateral pleural effusions, more so on the left side. Electronically Signed   By: Elmer Picker M.D.   On: 11/24/2021 11:47    Telemetry    RSR - Personally Reviewed  Cardiac Studies   2D Echocardiogram 9.16.2023  1. Left ventricular ejection fraction, by estimation, is 40 to 45%. The  left ventricle has mildly decreased function. The left ventricle  demonstrates global hypokinesis. Left ventricular diastolic parameters are  indeterminate. Elevated left ventricular  end-diastolic pressure.   2. Right ventricular systolic function is normal. The right ventricular  size is normal.   3. Large pleural effusion in the left lateral region.   4. The mitral valve is normal in structure. Trivial mitral valve  regurgitation. No evidence of mitral stenosis.   5. The aortic valve is tricuspid. There is moderate calcification of the  aortic valve. Aortic valve regurgitation is not visualized. Aortic valve  sclerosis/calcification is present, without any evidence of aortic  stenosis. Aortic valve area, by VTI  measures 2.39 cm.  Aortic valve mean gradient measures 3.0 mmHg. Aortic  valve Vmax measures 1.23 m/s.   6. The inferior vena cava is dilated in size with <50% respiratory  variability, suggesting right atrial pressure of 15 mmHg.   Patient Profile     41 y.o. 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  EF 40-45% by echo this admission.  Assessment & Plan    1.  Cardiac Arrest/NSTEMI:  presented 9/13 w/ resp failure and cardiac arrest (unclear initial presenting rhythm).  HsTrop to 2189.  Echo w/ EF 40-45% and global HK.  No chest pain or sob.  Currently receiving HD.  Plan for diagnostic cath today.  The patient understands that risks include but are not limited to stroke (1 in 1000), death (1 in 59), kidney failure [usually temporary] (1 in 500), bleeding (1 in 200), allergic reaction [possibly serious] (  1 in 200), and agrees to proceed. .   Cont asa, statin, ? blocker.  2.  Cardiomyopathy/HFmrEF:  as above, EF 40-45% w/ glob HK.  Several episodes of flash pulm edema, though breathing currently stable.  Still w/ mild lower ext edema and diminished breath sounds/pleural effusions.  Volume mgmt per HD. Cont ? blocker - will transition to carvedilol in setting of depressed EF.  Not currently on acei/arb/arni - will reconsider post-cath.  3.  Essential HTN:  BP cont to trend up. Will transition metoprolol to carvedilol.  Consider entresto in setting of #2.  4.  HL:  LDL 180.  Cont statin rx.  5.  Acute hypoxic resp failure/bilat pleural effusions:  s/p extubation and 900 ml R thoracentesis on 9/17.  Diminished breath sounds on exam though breathing stable on RA.  6.  Anemia:  S/p 1 u prbcs.  Stable.  7.  Peritonitis:  PD catheter grew MRSA.  BC neg.   Abx per IM.  Signed, Murray Hodgkins, NP  11/28/2021, 9:46 AM    For questions or updates, please contact   Please consult [email or web address] for contact info under Cardiology/STEMI.

## 2021-11-28 NOTE — Progress Notes (Signed)
[place], [place] 11/28/21  Subjective:   Hospital day # 8  Patient known to our practice from outpatient dialysis.  Last few weeks she had purulent exit site infection with MRSA.  She was treated with intraperitoneal vancomycin and advised to get the catheter removed from her surgeon, which patient missed scheduled appt. .  Patient seen and evaluated during dialysis   HEMODIALYSIS FLOWSHEET:  Blood Flow Rate (mL/min): 300 mL/min Arterial Pressure (mmHg): -120 mmHg Venous Pressure (mmHg): 280 mmHg TMP (mmHg): 0 mmHg Ultrafiltration Rate (mL/min): 1152 mL/min Dialysate Flow Rate (mL/min): 300 ml/min Dialysis Fluid Bolus: Normal Saline Bolus Amount (mL): 300 mL  No complaints at this time.  Room air   Objective:  Vital signs in last 24 hours:  Temp:  [97.3 F (36.3 C)-98.4 F (36.9 C)] 98.4 F (36.9 C) (09/21 0752) Pulse Rate:  [95-120] 118 (09/21 1030) Resp:  [9-30] 17 (09/21 1030) BP: (123-153)/(79-101) 153/94 (09/21 1030) SpO2:  [96 %-100 %] 100 % (09/21 1030) Weight:  [76 kg-77 kg] 76 kg (09/21 0752)  Weight change: -3 kg Filed Weights   11/27/21 0519 11/28/21 0500 11/28/21 0752  Weight: 76.2 kg 77 kg 76 kg    Intake/Output:    Intake/Output Summary (Last 24 hours) at 11/28/2021 1036 Last data filed at 11/28/2021 0800 Gross per 24 hour  Intake 279.29 ml  Output --  Net 279.29 ml      Physical Exam: General: NAD  HEENT Normocephalic, dry oral membranes  Pulm/lungs Diminished in bases, room air, normal effort  CVS/Heart Regular rhythm, tachycardic  Abdomen:  Nondistended, tender, soft  Extremities: + pitting edema  Neurologic: Alert/oriented, able to answer simple questions  Skin: No acute rashes  Access: Right IJ temporary catheter, left chest PermCath       Basic Metabolic Panel:  Recent Labs  Lab 11/24/21 0421 11/24/21 1600 11/25/21 0427 11/26/21 0515 11/27/21 0012 11/27/21 0427 11/28/21 0453  NA 133*  133* 137 138  --  136 134*  K 3.6 3.8 3.8 3.7  --  3.4* 2.8*  CL 95* 97* 97* 97*  --  98 97*  CO2 '27 22 25 24  '$ --  27 26  GLUCOSE 143* 156* 111* 134*  --  104* 114*  BUN 24* 28* 22* 20  --  20 30*  CREATININE 5.15* 5.43* 4.39* 3.76*  --  3.38* 4.84*  CALCIUM 7.3* 7.3* 7.7* 8.2*  --  8.2* 8.4*  MG 2.1  --  2.0 2.2 1.8  --  1.9  PHOS 4.2 5.5* 5.1* 5.4*  --  3.6 3.2      CBC: Recent Labs  Lab 11/24/21 0421 11/25/21 0427 11/26/21 0515 11/27/21 0012 11/28/21 0453  WBC 13.9* 16.0* 15.7* 9.9 10.1  HGB 9.2* 9.3* 9.9* 8.8* 9.4*  HCT 29.2* 29.4* 32.4* 29.1* 30.4*  MCV 90.4 90.2 92.8 93.0 92.7  PLT 160 183 228 187 196       Lab Results  Component Value Date   HEPBSAG NON REACTIVE 11/21/2021   HEPBIGM NON REACTIVE 10/01/2021      Microbiology:  Recent Results (from the past 240 hour(s))  Blood Culture (routine x 2)     Status: None   Collection Time: 11/20/21  5:54 PM   Specimen: BLOOD  Result Value Ref Range Status   Specimen Description BLOOD LEFT ANTECUBITAL  Final   Special Requests   Final    BOTTLES DRAWN AEROBIC AND ANAEROBIC Blood Culture adequate volume   Culture  Final    NO GROWTH 5 DAYS Performed at [facility], [address] [place], [place] [address]    Report Status 11/25/2021 FINAL  Final  SARS Coronavirus 2 by RT PCR (hospital order, performed in [facility] hospital lab) *cepheid single result test* Anterior Nasal Swab     Status: None   Collection Time: 11/20/21  6:14 PM   Specimen: Anterior Nasal Swab  Result Value Ref Range Status   SARS Coronavirus 2 by RT PCR NEGATIVE NEGATIVE Final    Comment: (NOTE) SARS-CoV-2 target nucleic acids are NOT DETECTED.  The SARS-CoV-2 RNA is generally detectable in upper and lower respiratory specimens during the acute phase of infection. The lowest concentration of SARS-CoV-2 viral copies this assay can detect is 250 copies / mL. A negative result does not preclude SARS-CoV-2 infection and  should not be used as the sole basis for treatment or other patient management decisions.  A negative result may occur with improper specimen collection / handling, submission of specimen other than nasopharyngeal swab, presence of viral mutation(s) within the areas targeted by this assay, and inadequate number of viral copies (<250 copies / mL). A negative result must be combined with clinical observations, patient history, and epidemiological information.  Fact Sheet for Patients:   https://www.patel.info/  Fact Sheet for Healthcare Providers: https://hall.com/  This test is not yet approved or  cleared by the [place] FDA and has been authorized for detection and/or diagnosis of SARS-CoV-2 by FDA under an Emergency Use Authorization (EUA).  This EUA will remain in effect (meaning this test can be used) for the duration of the COVID-19 declaration under Section 564(b)(1) of the Act, 21 U.S.C. section 360bbb-3(b)(1), unless the authorization is terminated or revoked sooner.  Performed at [facility], [address] [place], [place] [address]   Urine Culture     Status: None   Collection Time: 11/20/21  6:30 PM   Specimen: Urine, Random  Result Value Ref Range Status   Specimen Description   Final    URINE, RANDOM Performed at [facility], [address], [place], [place] [address]    Special Requests   Final    NONE Performed at [facility], [address] [place], [place] [address]    Culture   Final    NO GROWTH Performed at [facility], [place] [address] [place], [place] [address]    Report Status 11/22/2021 FINAL  Final  MRSA Next Gen by PCR, Nasal     Status: Abnormal   Collection Time: 11/20/21  7:50 PM   Specimen: Nasal Mucosa; Nasal Swab  Result Value Ref Range Status   MRSA by PCR Next Gen DETECTED (A) NOT DETECTED Final    Comment: RESULT CALLED TO, READ BACK BY AND  VERIFIED WITH: BETH BUONO AT 2210 ON 11/20/21 BY SS (NOTE) The GeneXpert MRSA Assay (FDA approved for NASAL specimens only), is one component of a comprehensive MRSA colonization surveillance program. It is not intended to diagnose MRSA infection nor to guide or monitor treatment for MRSA infections. Test performance is not FDA approved in patients less than 72 years old. Performed at [facility], [address] [place], [place] [address]   Blood Culture (routine x 2)     Status: None   Collection Time: 11/20/21  9:55 PM   Specimen: BLOOD  Result Value Ref Range Status   Specimen Description BLOOD CENTRAL LINE  Final   Special Requests  Final    BOTTLES DRAWN AEROBIC AND ANAEROBIC Blood Culture results may not be optimal due to an inadequate volume of blood received in culture bottles   Culture   Final    NO GROWTH 5 DAYS Performed at [facility], [address] [place], [place] [address]    Report Status 11/25/2021 FINAL  Final  Cath Tip Culture     Status: None   Collection Time: 11/22/21  4:45 PM   Specimen: Catheter Tip  Result Value Ref Range Status   Specimen Description CATH TIP  Final   Special Requests   Final    NONE Performed at [facility], [place] [address] [place], [place] [address]    Culture   Final    MODERATE METHICILLIN RESISTANT STAPHYLOCOCCUS AUREUS   Report Status 11/24/2021 FINAL  Final   Organism ID, Bacteria METHICILLIN RESISTANT STAPHYLOCOCCUS AUREUS  Final      Susceptibility   Methicillin resistant staphylococcus aureus - MIC*    CIPROFLOXACIN >=8 RESISTANT Resistant     ERYTHROMYCIN <=0.25 SENSITIVE Sensitive     GENTAMICIN <=0.5 SENSITIVE Sensitive     OXACILLIN >=4 RESISTANT Resistant     TETRACYCLINE <=1 SENSITIVE Sensitive     VANCOMYCIN 1 SENSITIVE Sensitive     TRIMETH/SULFA <=10 SENSITIVE Sensitive     CLINDAMYCIN <=0.25 SENSITIVE Sensitive     RIFAMPIN <=0.5 SENSITIVE Sensitive     Inducible Clindamycin  NEGATIVE Sensitive     * MODERATE METHICILLIN RESISTANT STAPHYLOCOCCUS AUREUS  Pleural fluid culture w Gram Stain     Status: None   Collection Time: 11/24/21 11:09 AM   Specimen: Pleural Fluid  Result Value Ref Range Status   Specimen Description   Final    PLEURAL Performed at [facility], [address] [place], [place] [address]    Special Requests   Final    NONE Performed at [facility], [place], [place] [address]    Gram Stain NO WBC SEEN NO ORGANISMS SEEN   Final   Culture   Final    NO GROWTH 3 DAYS Performed at [facility], [place] [address] [place], [place] [address]    Report Status 11/28/2021 FINAL  Final    Coagulation Studies: No results for input(s): "LABPROT", "INR" in the last 72 hours.   Urinalysis: No results for input(s): "COLORURINE", "LABSPEC", "PHURINE", "GLUCOSEU", "HGBUR", "BILIRUBINUR", "KETONESUR", "PROTEINUR", "UROBILINOGEN", "NITRITE", "LEUKOCYTESUR" in the last 72 hours.  Invalid input(s): "APPERANCEUR"     Imaging: CT Angio Chest Pulmonary Embolism (PE) W or WO Contrast  Result Date: 11/26/2021 CLINICAL DATA:  Insert epic diagnosis and indication EXAM: CT ANGIOGRAPHY CHEST WITH CONTRAST TECHNIQUE: Multidetector CT imaging of the chest was performed using the standard protocol during bolus administration of intravenous contrast. Multiplanar CT image reconstructions and MIPs were obtained to evaluate the vascular anatomy. RADIATION DOSE REDUCTION: This exam was performed according to the departmental dose-optimization program which includes automated exposure control, adjustment of the mA and/or kV according to patient size and/or use of iterative reconstruction technique. CONTRAST:  83m OMNIPAQUE IOHEXOL 350 MG/ML SOLN COMPARISON:  Radiograph yesterday.  Chest CTA 6 days ago FINDINGS: Cardiovascular: There are no filling defects within the pulmonary arteries to suggest pulmonary embolus. Normal caliber  thoracic aorta with mild atherosclerosis. Cannot assess for dissection given phase of contrast tailored to pulmonary artery assessment. Mild cardiomegaly. Small pericardial effusion has minimally increased. Contrast refluxes into the hepatic veins and IVC. New  left-sided internal jugular dialysis catheter, tip at the atrial caval junction. Gas in the overlying left supraclavicular soft tissues likely related to recent placement. Right internal jugular central venous catheter tip in the lower SVC. Mediastinum/Nodes: No suspicious adenopathy or mass. No mediastinal hemorrhage. Interval extubation. Patulous esophagus. Lungs/Pleura: Moderate bilateral pleural effusions have slightly diminished from prior exam. There is subtotal consolidation throughout the left lower lobe. Airspace disease in the dependent right lower lobe may represent compressive atelectasis. Perihilar consolidations have improved. Persistent but improved septal thickening and patchy ground-glass typical of pulmonary edema no pneumothorax. Upper Abdomen: Mild contrast refluxing into the hepatic veins and IVC. Vascular calcifications in the upper abdomen. No acute upper abdominal findings. Musculoskeletal: Again seen buckle fracture of the sternum. No new osseous findings. Review of the MIP images confirms the above findings. IMPRESSION: 1. No pulmonary embolus. 2. Moderate bilateral pleural effusions have slightly diminished from prior exam. 3. Persistent but diminished septal thickening and patchy ground-glass typical of resolving pulmonary edema. 4. Subtotal consolidation throughout the left lower lobe with air bronchograms, atelectasis over pneumonia. Airspace disease in the dependent right lower lobe may represent compressive atelectasis. Improving perihilar consolidations. 5. New left-sided internal jugular dialysis catheter, tip at the atrial caval junction. Gas in the overlying left supraclavicular soft tissues likely related to recent  placement. 6. Mild cardiomegaly. Contrast refluxing into the hepatic veins and IVC consistent with elevated right heart pressures. Small pericardial effusion has minimally increased. Aortic Atherosclerosis (ICD10-I70.0). Electronically Signed   By: Keith Rake M.D.   On: 11/26/2021 18:25   PERIPHERAL VASCULAR CATHETERIZATION  Result Date: 11/26/2021 See surgical note for result.    Medications:    sodium chloride     sodium chloride Stopped (11/26/21 1445)   heparin 1,250 Units/hr (11/28/21 0800)    aspirin EC  81 mg Oral Daily   atorvastatin  40 mg Oral Daily   carvedilol  12.5 mg Oral BID WC   Chlorhexidine Gluconate Cloth  6 each Topical Q0600   epoetin (EPOGEN/PROCRIT) injection  4,000 Units Intravenous Q T,Th,Sa-HD   feeding supplement  237 mL Oral TID BM   heparin sodium (porcine)       insulin aspart  0-9 Units Subcutaneous Q4H   levothyroxine  50 mcg Oral Q0600   linezolid  600 mg Oral Q12H   methadone  20 mg Oral Daily   multivitamin  1 tablet Per Tube QHS   pantoprazole  40 mg Oral Daily   sodium chloride flush  3 mL Intravenous Q12H   sodium chloride, acetaminophen, docusate sodium, fentaNYL (SUBLIMAZE) injection, heparin sodium (porcine), labetalol, ondansetron (ZOFRAN) IV, mouth rinse, polyethylene glycol, sodium chloride flush  Assessment/ Plan:  41 y.o. female with end-stage renal disease on peritoneal dialysis chronic pain syndrome, history of diabetes, now diet controlled, hypertension, hyperlipidemia    admitted on 11/20/2021 for Cardiac arrest [facility]) [I46.9]  #End-stage renal disease with generalized edema Peritoneal dialysis with severe MRSA exit site infection.     Receiving dialysis , UF goal 2.5-3L as tolerated. Order paced to remove HD temp cath yesterday. Next treatment scheduled for Saturday, seated in chair.  Renal navigator has confirmed outpatient clinic at [facility] on a TTS schedule.     #MRSA exit site infection (11/07/21) PD catheter  removed on 11/22/21.  Receiving linezolid. Completed Vancomycin.  #Anemia of chronic kidney disease Lab Results  Component Value Date   HGB 9.4 (L) 11/28/2021    Received blood transfusion this admission. Hemoglobin at goal Continue  low-dose EPO with dialysis treatments for now.  Secondary hyperparathyroidism of renal origin Lab Results  Component Value Date   CALCIUM 8.4 (L) 11/28/2021   PHOS 3.2 11/28/2021   Patient with hypocalcemia and hyperphosphatemia. Calcium imrpvong  Acute respiratory failure with pleural effusion, bilateral.  Seen on chest x-ray.  Thoracentesis performed at bedside on 11/24/21 with yield 900 cc.   Remains on room air.   LOS: [place] 9/21/202310:36 AM  [place], [place]

## 2021-11-28 NOTE — Progress Notes (Signed)
[place] for initiation and monitoring of heparin infusion Indication: chest pain/ACS  Patient Measurements: Height: '5\' 7"'$  (170.2 cm) Weight: 77 kg (169 lb 12.1 oz) IBW/kg (Calculated) : 61.6 Heparin Dosing Weight: 76.2 kg  Labs: Recent Labs    11/26/21 0515 11/26/21 1515 11/27/21 0012 11/27/21 0012 11/27/21 0427 11/27/21 1155 11/27/21 2128 11/28/21 0453  HGB 9.9*  --  8.8*  --   --   --   --  9.4*  HCT 32.4*  --  29.1*  --   --   --   --  30.4*  PLT 228  --  187  --   --   --   --  196  HEPARINUNFRC  --   --   --    < > 0.30 0.27* 0.37 0.32  CREATININE 3.76*  --   --   --  3.38*  --   --  4.84*  TROPONINIHS  --    < > 3,504*  --  4,204* 2,534*  --  3,006*   < > = values in this interval not displayed.     Estimated Creatinine Clearance: 16 mL/min (A) (by C-G formula based on SCr of 4.84 mg/dL (H)).   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 Patient has been transitioned to HD this admission. Pharmacy is asked to initiate and monitor IV heparin. No chronic anticoagulation prior to admission. Baseline INR 1.4, aPTT wnl  Goal of Therapy:  Heparin level 0.3-0.7 units/ml Monitor platelets by anticoagulation protocol: Yes   9/20 0427 HL 0.3  9/20 1155 HL 0.27, subtherapeutic 9/20 2128 HL 0.37, therapeutic x1 9/21 0453 HL 0.32, therapeutic x 2  Plan:  --Heparin level is therapeutic x 2   --Continue heparin infusion rate at 1250 units/hr --Re-check HL 8 hr w/ AM labs while therapeutic --Daily CBC per protocol while on IV heparin  Renda Rolls, PharmD, [facility] 11/28/2021 7:07 AM

## 2021-11-28 NOTE — Progress Notes (Signed)
Progress Note   Patient: Nichole Wiley QPY:[id number] DOB: 16-Dec-1980 DOA: 11/20/2021     8 DOS: the patient was seen and examined on 11/28/2021   Brief hospital course: ICU Transfer:  Taken from prior notes.  41 y.o. female whose medical history is most notable for chronic end-stage renal disease on peritoneal dialysis, as well as chronic pain syndrome, pelvic floor dysfunction, prior history of opioid addiction who presented to the hospital on 9/11 for the evaluation of infected peritoneal dialysis cathter. Her nephrologist is [name] who sent her in for an evaluation. She was discharged home the following day and re-admitted on 9/13 to the ED and suffered a cardiac arrest. She was intubated and started on CRRT given multiple electrolyte abnormalities and worsening renal function.  Her plan to remove peritoneal dialysis got delayed due to death of her fianc.  She presented to ER on 9/13 with severe shortness of breath and later became unresponsive and have an acute sudden cardiac arrest requiring resuscitation and she was placed on ventilator. She was extubated on 11/25/2021.  Developed another severe hypoxia with flash pulmonary edema on 11/26/2021 requiring BiPAP which was thought to be due to volume overload. Respiratory status improved with dialysis.  She also received IV Lasix.  Patient also received 2 days of CRRT while in ICU.  Now doing regular dialysis. Her infected PD catheter was removed by general surgery in OR on 11/22/2021.  Catheter tip growing MRSA.  Blood cultures remain negative.  Patient also received 1 unit of PRBC for anemia with appropriate response.  Echocardiogram done on 9/16 shows concern of HFrEF with EF of 40 to 45%, indeterminate diastolic parameters, normal right ventricular systolic function.  Cardiology was consulted, patient needs ischemic work-up.  TRH to resume care on 11/26/2021.  9/20: Cardiology saw her and  right and left cardiac catheterization is  scheduled for tomorrow.  Repeat troponin continued to rise, currently at 4204, cardiology started her on heparin infusion.  No chest pain. Permanent tunneled catheter was placed by vascular surgery yesterday.  Psych was consulted for concern of methadone withdrawal, apparently she was taking quite high dose at [facility].  This started her on a low-dose of 20 mg daily, patient with prolonged QTc and history of recent cardiac arrest, also on dialysis. Hemoglobin with 1 unit drop to 8.8 today.  Leukocytosis resolved.  9/21: Patient remained hemodynamically stable, hemoglobin improved to 9.4, troponin trending up after decreasing.  No chest pain.  Going for right and left cardiac catheterization later today.     Assessment and Plan: * Cardiac arrest [facility]) Sudden cardiac arrest s/p CPR and vent support.  Extubated on 11/25/2021  Troponin elevated which peaked at 532, can be due to demand ischemia secondary to cardiac arrest.  No obvious reason. Echocardiogram with EF of 40 to 45%, global hypokinesis and indeterminate diastolic function.  Cardiology was consulted from ICU for ischemic work-up. Now also concern of NSTEMI so she was started on heparin infusion. -Going for right and left cardiac catheterization tomorrow     NSTEMI (non-ST elevated myocardial infarction) [facility]) Patient with worsening troponin, concern of NSTEMI.  No chest pain.  She was started on heparin infusion by cardiology. -Right and left cardiac catheterization today -Being managed by cardiology  Acute HFrEF (heart failure with reduced ejection fraction) ([place]) In the setting of sudden cardiac arrest.Recurrent pulmonary edema which improved with IV Lasix along with dialysis. -Cardiology was consulted. -Going for right and left cardiac catheterization today  Severe sepsis ([place]) Secondary to infected peritoneal catheter which was removed by general surgery on 11/22/2021.  Tip culture with MRSA.  Blood cultures remain  negative. 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  She was started on low-dose methadone at 20 mg as she was not taking it for the past many days. Still having some burning sensations at her face which she attributed to methadone withdrawal. Patient also has mild QTc prolongation. -Continue with current dose of methadone  Infection due to peritoneal dialysis catheter ([place]) See above  Toxic metabolic encephalopathy Most likely secondary to sepsis and sudden cardiac arrest.  Resolved. -Continue to monitor  ESRD (end stage renal disease) on dialysis [facility]) Patient was initially doing peritoneal dialysis, catheter was removed on 11/22/2021 for concern of infection.  Currently getting dialysis with temporary HD catheter which is being switched to permanent catheter today. Received dialysis today with removal of 3.5 L of fluid. -Nephrology is on board -Continue with hemodialysis  Acute respiratory failure with hypoxia [facility]) Patient initially required ventilator support as an attempt for CPR with cardiac arrest.  Recurrent flash pulmonary edema with acute HFrEF. Developed another episode this morning requiring BiPAP, improved with dialysis. -Continue to monitor -Continue with supplemental oxygen-wean as tolerated.  No baseline oxygen use.  Pleural effusion on right Most likely secondary to heart failure with recurrent pulmonary edema. S/p thoracentesis on 9/17. -Continue with supportive care  HTN (hypertension) Blood pressure mildly elevated. Also had tachycardia. Amlodipine and labetalol were listed in her chart but apparently she was not taking them. -Start her on low-dose metoprolol. -Continue to monitor  -Patient will need ACE inhibitor or ARB based on acute HFrEF, defer that decision to cardiology   Hypothyroidism TSH  was elevated in May 2023 at 14.53 with low free T4 at 0.7.  Patient was apparently started on low-dose Synthroid which she was not taking for about a month.  Repeat TSH with marked elevation at 74.02 and decrease free T4 at 0.44 Patient was restarted on home dose yesterday. -Increasing the home dose to 50 -Patient will need repeat labs in 3 to 4 weeks by PCP     Subjective: Patient was seen and examined during dialysis today.  Denies any chest pain or shortness of breath.  She was little anxious for upcoming cardiac catheterization after dialysis.  Reassurance was provided.  Physical Exam: Vitals:   11/28/21 1115 11/28/21 1130 11/28/21 1304 11/28/21 1323  BP: (!) 131/106 (!) 146/91 (!) 153/98   Pulse: (!) 118 (!) 120 (!) 120   Resp: (!) '26 17 18   '$ Temp:  97.9 F (36.6 C)  97.9 F (36.6 C)  TempSrc:  Oral    SpO2: 99% 99% 98%   Weight:  74.7 kg    Height:       General.  Well-developed lady, in no acute distress. Pulmonary.  Lungs clear bilaterally, normal respiratory effort. CV.  Regular rate and rhythm, no JVD, rub or murmur. Abdomen.  Soft, nontender, nondistended, BS positive. CNS.  Alert and oriented .  No focal neurologic deficit. Extremities.  No edema, no cyanosis, pulses intact and symmetrical. Psychiatry.  Judgment and insight appears normal.  Data Reviewed: Prior data reviewed  Family Communication: Discussed with patient  Disposition: Status is: Inpatient Remains inpatient appropriate because: Severity of illness   Planned Discharge Destination: Home  DVT prophylaxis.  Heparin infusion  Time spent: 50 minutes  This record has been created using Systems analyst. Errors have been sought and corrected,but may not always be located. Such creation errors do not reflect on the standard of care.  Author: Lorella Nimrod, MD 11/28/2021 2:18 PM  For on call review [email or web address].

## 2021-11-29 ENCOUNTER — Encounter: Payer: Self-pay | Admitting: Cardiology

## 2021-11-29 DIAGNOSIS — I469 Cardiac arrest, cause unspecified: Secondary | ICD-10-CM | POA: Diagnosis not present

## 2021-11-29 DIAGNOSIS — I420 Dilated cardiomyopathy: Secondary | ICD-10-CM

## 2021-11-29 DIAGNOSIS — N186 End stage renal disease: Secondary | ICD-10-CM | POA: Diagnosis not present

## 2021-11-29 DIAGNOSIS — I10 Essential (primary) hypertension: Secondary | ICD-10-CM

## 2021-11-29 DIAGNOSIS — J9601 Acute respiratory failure with hypoxia: Secondary | ICD-10-CM | POA: Diagnosis not present

## 2021-11-29 DIAGNOSIS — I5021 Acute systolic (congestive) heart failure: Secondary | ICD-10-CM | POA: Diagnosis not present

## 2021-11-29 DIAGNOSIS — T8571XA Infection and inflammatory reaction due to peritoneal dialysis catheter, initial encounter: Secondary | ICD-10-CM | POA: Diagnosis not present

## 2021-11-29 LAB — GLUCOSE, CAPILLARY
Glucose-Capillary: 104 mg/dL — ABNORMAL HIGH (ref 70–99)
Glucose-Capillary: 110 mg/dL — ABNORMAL HIGH (ref 70–99)
Glucose-Capillary: 114 mg/dL — ABNORMAL HIGH (ref 70–99)
Glucose-Capillary: 212 mg/dL — ABNORMAL HIGH (ref 70–99)

## 2021-11-29 LAB — RENAL FUNCTION PANEL
Albumin: 2.5 g/dL — ABNORMAL LOW (ref 3.5–5.0)
Anion gap: 10 (ref 5–15)
BUN: 25 mg/dL — ABNORMAL HIGH (ref 6–20)
CO2: 26 mmol/L (ref 22–32)
Calcium: 8.4 mg/dL — ABNORMAL LOW (ref 8.9–10.3)
Chloride: 101 mmol/L (ref 98–111)
Creatinine, Ser: 4.18 mg/dL — ABNORMAL HIGH (ref 0.44–1.00)
GFR, Estimated: 13 mL/min — ABNORMAL LOW (ref >60)
Glucose, Bld: 102 mg/dL — ABNORMAL HIGH (ref 70–99)
Phosphorus: 3.1 mg/dL (ref 2.5–4.6)
Potassium: 3.1 mmol/L — ABNORMAL LOW (ref 3.5–5.1)
Sodium: 137 mmol/L (ref 135–145)

## 2021-11-29 LAB — CBC
HCT: 30.4 % — ABNORMAL LOW (ref 36.0–46.0)
Hemoglobin: 9.3 g/dL — ABNORMAL LOW (ref 12.0–15.0)
MCH: 28.3 pg (ref 26.0–34.0)
MCHC: 30.6 g/dL (ref 30.0–36.0)
MCV: 92.4 fL (ref 80.0–100.0)
Platelets: 197 10*3/uL (ref 150–400)
RBC: 3.29 MIL/uL — ABNORMAL LOW (ref 3.87–5.11)
RDW: 15 % (ref 11.5–15.5)
WBC: 7.9 10*3/uL (ref 4.0–10.5)
nRBC: 0 % (ref 0.0–0.2)

## 2021-11-29 LAB — MAGNESIUM: Magnesium: 2 mg/dL (ref 1.7–2.4)

## 2021-11-29 MED ORDER — HEPARIN SODIUM (PORCINE) 5000 UNIT/ML IJ SOLN
5000.0000 [IU] | Freq: Three times a day (TID) | INTRAMUSCULAR | Status: DC
  Filled 2021-11-29: qty 1

## 2021-11-29 MED ORDER — POTASSIUM CHLORIDE CRYS ER 20 MEQ PO TBCR
20.0000 meq | EXTENDED_RELEASE_TABLET | Freq: Once | ORAL | Status: AC
  Administered 2021-11-29: 20 meq via ORAL
  Filled 2021-11-29: qty 1

## 2021-11-29 NOTE — Assessment & Plan Note (Signed)
No chest pain, at rest or with ambulation. Completed 48 hours of heparin infusion. Right and left cardiac catheterization with diffuse three-vessel disease, patient will need CABG which will be done and being evaluated as an outpatient by cardiothoracic surgery. -Continue aspirin, carvedilol and Lipitor

## 2021-11-29 NOTE — Progress Notes (Signed)
Patient desired to go home today expressing she had things she needed to get done and she was feeling well.  Patient ambulating in the room and unit, to [facility] with no assistance.  Reached out to Dr. Reesa Chew regarding patients desire to go home, as she patient said that she had dialysis appointment set up for tomorrow and patient states there is no need further to stay, Dr. Reesa Chew advised for patient to go home tomorrow after dialysis. Patient continues to state desire to go home. Upon entering patients room at 1545, patient was dressed and had disconnected herself from all equipment and further packed all her belongings up and requesting to have PIVs removed, educated patient that this would be an [place] and not an official discharge which would include needed prescriptions and wheelchair escort is not provided, patient verbalized understanding of what AMA entails.  Patient allowed this nurse to remove her two PIVs, patient signed AMA form, patient gathered belongings and ambulated out of the unit.  Dr. Reesa Chew notified of patient leaving AMA.

## 2021-11-29 NOTE — Progress Notes (Signed)
Subjective:  CC: Nichole Wiley is a 41 y.o. female  Hospital stay day 9,  PD cath removal  HPI: No acute issues overnight with PD sites.  ROS:  General: Denies weight loss, weight gain, fatigue, fevers, chills, and night sweats. Heart: Denies chest pain, palpitations, racing heart, irregular heartbeat, leg pain or swelling, and decreased activity tolerance. Respiratory: Denies breathing difficulty, shortness of breath, wheezing, cough, and sputum. GI: Denies change in appetite, heartburn, nausea, vomiting, constipation, diarrhea, and blood in stool. GU: Denies difficulty urinating, pain with urinating, urgency, frequency, blood in urine.   Objective:   Temp:  [97.9 F (36.6 C)-99.3 F (37.4 C)] 98.9 F (37.2 C) (09/22 0400) Pulse Rate:  [41-124] 101 (09/22 0700) Resp:  [3-27] 15 (09/22 0700) BP: (92-156)/(59-107) 124/87 (09/22 0600) SpO2:  [3 %-100 %] 97 % (09/22 0700) Weight:  [74.7 kg] 74.7 kg (09/21 1130)     Height: '5\' 7"'$  (170.2 cm) Weight: 74.7 kg BMI (Calculated): 25.79   Intake/Output this shift:   Intake/Output Summary (Last 24 hours) at 11/29/2021 0849 Last data filed at 11/28/2021 1700 Gross per 24 hour  Intake 42.73 ml  Output 2150 ml  Net -2107.27 ml    Constitutional :  alert, cooperative, appears stated age, and no distress  Respiratory:  clear to auscultation bilaterally  Cardiovascular:  regular rate and rhythm  Gastrointestinal: soft, non-tender; bowel sounds normal; no masses,  no organomegaly.   Skin: Cool and moist. PD cath and staple lines healing well.   Psychiatric: Normal affect, non-agitated, not confused       LABS:     Latest Ref Rng & Units 11/29/2021    5:36 AM 11/28/2021    4:53 AM 11/27/2021    4:27 AM  CMP  Glucose 70 - 99 mg/dL 102  114  104   BUN 6 - 20 mg/dL '25  30  20   '$ Creatinine 0.44 - 1.00 mg/dL 4.18  4.84  3.38   Sodium 135 - 145 mmol/L 137  134  136   Potassium 3.5 - 5.1 mmol/L 3.1  2.8  3.4   Chloride 98 - 111 mmol/L 101   97  98   CO2 22 - 32 mmol/L '26  26  27   '$ Calcium 8.9 - 10.3 mg/dL 8.4  8.4  8.2       Latest Ref Rng & Units 11/29/2021    5:36 AM 11/28/2021    4:53 AM 11/27/2021   12:12 AM  CBC  WBC 4.0 - 10.5 K/uL 7.9  10.1  9.9   Hemoglobin 12.0 - 15.0 g/dL 9.3  9.4  8.8   Hematocrit 36.0 - 46.0 % 30.4  30.4  29.1   Platelets 150 - 400 K/uL 197  196  187     RADS: N/a Assessment:   S/p PD cath removal. Healing well.  Staples removed with no issues. No need to keep area covered unless drainage  Surgery to sign off.  No f/u needed.  labs/images/medications/previous chart entries reviewed personally and relevant changes/updates noted above.

## 2021-11-29 NOTE — Progress Notes (Signed)
Progress Note   Patient: Nichole Wiley MPN:[id number] DOB: 17-Aug-1980 DOA: 11/20/2021     9 DOS: the patient was seen and examined on 11/29/2021   Brief hospital course: ICU Transfer:  Taken from prior notes.  41 y.o. female whose medical history is most notable for chronic end-stage renal disease on peritoneal dialysis, as well as chronic pain syndrome, pelvic floor dysfunction, prior history of opioid addiction who presented to the hospital on 9/11 for the evaluation of infected peritoneal dialysis cathter. Her nephrologist is [name] who sent her in for an evaluation. She was discharged home the following day and re-admitted on 9/13 to the ED and suffered a cardiac arrest. She was intubated and started on CRRT given multiple electrolyte abnormalities and worsening renal function.  Her plan to remove peritoneal dialysis got delayed due to death of her fianc.  She presented to ER on 9/13 with severe shortness of breath and later became unresponsive and have an acute sudden cardiac arrest requiring resuscitation and she was placed on ventilator. She was extubated on 11/25/2021.  Developed another severe hypoxia with flash pulmonary edema on 11/26/2021 requiring BiPAP which was thought to be due to volume overload. Respiratory status improved with dialysis.  She also received IV Lasix.  Patient also received 2 days of CRRT while in ICU.  Now doing regular dialysis. Her infected PD catheter was removed by general surgery in OR on 11/22/2021.  Catheter tip growing MRSA.  Blood cultures remain negative.  Patient also received 1 unit of PRBC for anemia with appropriate response.  Echocardiogram done on 9/16 shows concern of HFrEF with EF of 40 to 45%, indeterminate diastolic parameters, normal right ventricular systolic function.  Cardiology was consulted, patient needs ischemic work-up.  TRH to resume care on 11/26/2021.  9/20: Cardiology saw her and  right and left cardiac catheterization is  scheduled for tomorrow.  Repeat troponin continued to rise, currently at 4204, cardiology started her on heparin infusion.  No chest pain. Permanent tunneled catheter was placed by vascular surgery yesterday.  Psych was consulted for concern of methadone withdrawal, apparently she was taking quite high dose at [facility].  This started her on a low-dose of 20 mg daily, patient with prolonged QTc and history of recent cardiac arrest, also on dialysis. Hemoglobin with 1 unit drop to 8.8 today.  Leukocytosis resolved.  9/21: Patient remained hemodynamically stable, hemoglobin improved to 9.4, troponin trending up after decreasing.  No chest pain.  Going for right and left cardiac catheterization later today.  9/22: Right and left cardiac catheterization shows moderate to severe three-vessel diffuse coronary artery disease without reasonable PCI targets.  Left heart and pulmonary artery pressures were severely elevated.  Cardiac surgery was consulted to discuss CABG which should be pursued in the future as outpatient.  Patient need optimization of fluid removal for her heart failure which should be obtained with dialysis. Labs seems stable except mild hypokalemia with potassium of 3.1.  Patient seems stable with no chest pain, completed heparin infusion and wants to be discharged but there is no outpatient dialysis spot available for her routine dialysis tomorrow.  Patient is pretty volume up and cardiology would like diuresed well which is being managed with dialysis in her. Patient can be discharged home after getting dialysis tomorrow morning so she can resume her routine and close outpatient cardiology follow-up.   Assessment and Plan: * Cardiac arrest [facility]) Sudden cardiac arrest s/p CPR and vent support.  Extubated on 11/25/2021  Troponin elevated which peaked at 532, can be due to demand ischemia secondary to cardiac arrest.  No obvious reason. Echocardiogram with EF of 40 to 45%, global  hypokinesis and indeterminate diastolic function.  Cardiology was consulted from ICU for ischemic work-up. Now also concern of NSTEMI so she was started on heparin infusion. -Going for right and left cardiac catheterization tomorrow     NSTEMI (non-ST elevated myocardial infarction) ([facility]) No chest pain, at rest or with ambulation. Completed 48 hours of heparin infusion. Right and left cardiac catheterization with diffuse three-vessel disease, patient will need CABG which will be done and being evaluated as an outpatient by cardiothoracic surgery. -Continue aspirin, carvedilol and Lipitor  Acute HFrEF (heart failure with reduced ejection fraction) ([place]) In the setting of sudden cardiac arrest.Recurrent pulmonary edema which improved with IV Lasix along with dialysis. Right and left cardiac catheterization with diffuse three-vessel disease, no targets for PCI. Patient need outpatient cardiothoracic evaluation for CABG. also found to have severely elevated left ventricular and pulmonary vascular pressures, she need optimization of her volume which is being managed with dialysis. Also need to have optimization of her medications for heart failure and CAD. Remained chest pain-free even with ambulation. -Continue carvedilol -Will get benefit from [facility]- will let cardiology decide.  Severe sepsis ([place]) Secondary to infected peritoneal catheter which was removed by general surgery on 11/22/2021.  Tip culture with MRSA.  Blood cultures remain negative. 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  She was started on low-dose methadone at 20 mg as she was not taking it for the past many days.  Currently seems stable on this dose. -Continue with current dose of methadone  Infection due to peritoneal dialysis catheter ([place]) See  above  Toxic metabolic encephalopathy Most likely secondary to sepsis and sudden cardiac arrest.  Resolved. -Continue to monitor  ESRD (end stage renal disease) on dialysis [facility]) Patient was initially doing peritoneal dialysis, catheter was removed on 11/22/2021 for concern of infection.  Currently getting dialysis with temporary HD catheter which is being switched to permanent catheter today. Received dialysis today with removal of 3.5 L of fluid. -Nephrology is on board -Continue with hemodialysis  Acute respiratory failure with hypoxia ([place]) Resolved. Patient initially required ventilator support as an attempt for CPR with cardiac arrest.  Recurrent flash pulmonary edema with acute HFrEF. -Continue to monitor -Continue with supplemental oxygen-wean as tolerated.  No baseline oxygen use.  Pleural effusion on right Most likely secondary to heart failure with recurrent pulmonary edema. S/p thoracentesis on 9/17. -Continue with supportive care  HTN (hypertension) Blood pressure mildly elevated. Also had tachycardia. Amlodipine and labetalol were listed in her chart but apparently she was not taking them. -Start her on low-dose metoprolol. -Continue to monitor  -Patient will need ACE inhibitor or ARB based on acute HFrEF, defer that decision to cardiology   Hypothyroidism TSH was elevated in May 2023 at 14.53 with low free T4 at 0.7.  Patient was apparently started on low-dose Synthroid which she was not taking for about a month.  Repeat TSH with marked elevation at 74.02 and decrease free T4 at 0.44 Patient was restarted on home dose yesterday. -Increasing the home dose to 50 -Patient will need repeat labs in 3 to 4 weeks by PCP      Subjective: Patient was seen and examined today.  Denies any chest  pain or shortness of breath.  Able to ambulate without any difficulty.  She wants to go home.  Physical Exam: Vitals:   11/29/21 0600 11/29/21 0700 11/29/21 0800 11/29/21 1208   BP: 124/87   135/87  Pulse: (!) 109 (!) 101 99 93  Resp: 16 15 (!) 9 14  Temp:      TempSrc:      SpO2: (!) 89% 97% 98% 100%  Weight:      Height:       General.  Well-developed lady, in no acute distress. Pulmonary.  Lungs clear bilaterally, normal respiratory effort. CV.  Regular rate and rhythm, no JVD, rub or murmur. Abdomen.  Soft, nontender, nondistended, BS positive. CNS.  Alert and oriented .  No focal neurologic deficit. Extremities.  No edema, no cyanosis, pulses intact and symmetrical. Psychiatry.  Judgment and insight appears normal.  Data Reviewed: Prior data reviewed  Family Communication: Discussed with patient  Disposition: Status is: Inpatient Remains inpatient appropriate because: Severity of illness   Planned Discharge Destination: Home  DVT prophylaxis.  Subcu heparin Time spent: 45 minutes  This record has been created using Systems analyst. Errors have been sought and corrected,but may not always be located. Such creation errors do not reflect on the standard of care.  Author: Lorella Nimrod, MD 11/29/2021 1:11 PM  For on call review [email or web address].

## 2021-11-29 NOTE — Assessment & Plan Note (Signed)
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  She was started on low-dose methadone at 20 mg as she was not taking it for the past many days.  Currently seems stable on this dose. -Continue with current dose of methadone

## 2021-11-29 NOTE — Progress Notes (Signed)
Rounding Note    Patient Name: Nichole Wiley Date of Encounter: 11/29/2021  [facility] Cardiologist: Nelva Bush, MD   Subjective   Reports that she feels well this morning, still with residual lower extremity edema but much improved She reports the peritoneal dialysis was not working over the past several weeks to months, had significant fluid retention She has had dramatic improvement in symptoms with hemodialysis Reports breathing better since dialysis and thoracentesis Completed dialysis yesterday Reports she is scheduled for dialysis Tuesday, Thursday, Saturday as outpatient  Inpatient Medications    Scheduled Meds:  aspirin EC  81 mg Oral Daily   atorvastatin  40 mg Oral Daily   carvedilol  12.5 mg Oral BID WC   Chlorhexidine Gluconate Cloth  6 each Topical Q0600   epoetin (EPOGEN/PROCRIT) injection  4,000 Units Intravenous Q T,Th,Sa-HD   feeding supplement  237 mL Oral TID BM   heparin injection (subcutaneous)  5,000 Units Subcutaneous Q8H   insulin aspart  0-9 Units Subcutaneous Q4H   levothyroxine  50 mcg Oral Q0600   linezolid  600 mg Oral Q12H   methadone  20 mg Oral Daily   multivitamin  1 tablet Per Tube QHS   pantoprazole  40 mg Oral Daily   sodium chloride flush  3 mL Intravenous Q12H   sodium chloride flush  3 mL Intravenous Q12H   Continuous Infusions:  sodium chloride     sodium chloride Stopped (11/26/21 1445)   sodium chloride     PRN Meds: sodium chloride, sodium chloride, acetaminophen, docusate sodium, fentaNYL (SUBLIMAZE) injection, labetalol, ondansetron (ZOFRAN) IV, mouth rinse, polyethylene glycol, sodium chloride flush, sodium chloride flush   Vital Signs    Vitals:   11/29/21 0600 11/29/21 0700 11/29/21 0800 11/29/21 1208  BP: 124/87   135/87  Pulse: (!) 109 (!) 101 99 93  Resp: 16 15 (!) 9 14  Temp:   98.8 F (37.1 C) 98.7 F (37.1 C)  TempSrc:   Oral Oral  SpO2: (!) 89% 97% 98% 100%  Weight:      Height:         Intake/Output Summary (Last 24 hours) at 11/29/2021 1451 Last data filed at 11/28/2021 1700 Gross per 24 hour  Intake --  Output 350 ml  Net -350 ml      11/28/2021   11:30 AM 11/28/2021    7:52 AM 11/28/2021    5:00 AM  Last 3 Weights  Weight (lbs) 164 lb 10.9 oz 167 lb 8.8 oz 169 lb 12.1 oz  Weight (kg) 74.7 kg 76 kg 77 kg      Telemetry    Normal sinus rhythm- Personally Reviewed  ECG     - Personally Reviewed  Physical Exam   GEN: No acute distress.   Neck: No JVD Cardiac: RRR, no murmurs, rubs, or gallops.  Trace to 1+ pitting lower extremity edema Respiratory: Clear to auscultation bilaterally. GI: Soft, nontender, non-distended  MS: No edema; No deformity. Neuro:  Nonfocal  Psych: Normal affect   Labs    High Sensitivity Troponin:   Recent Labs  Lab 11/26/21 1515 11/27/21 0012 11/27/21 0427 11/27/21 1155 11/28/21 0453  TROPONINIHS 2,189* 3,504* 4,204* 2,534* 3,006*     Chemistry Recent Labs  Lab 11/27/21 0012 11/27/21 0427 11/28/21 0453 11/29/21 0536  NA  --  136 134* 137  K  --  3.4* 2.8* 3.1*  CL  --  98 97* 101  CO2  --  27 26  26  GLUCOSE  --  104* 114* 102*  BUN  --  20 30* 25*  CREATININE  --  3.38* 4.84* 4.18*  CALCIUM  --  8.2* 8.4* 8.4*  MG 1.8  --  1.9 2.0  ALBUMIN  --  2.1* 2.4* 2.5*  GFRNONAA  --  17* 11* 13*  ANIONGAP  --  '11 11 10    '$ Lipids  Recent Labs  Lab 11/26/21 1515  CHOL 321*  TRIG 160*  HDL 50  LDLCALC 239*  CHOLHDL 6.4    Hematology Recent Labs  Lab 11/27/21 0012 11/28/21 0453 11/29/21 0536  WBC 9.9 10.1 7.9  RBC 3.13* 3.28* 3.29*  HGB 8.8* 9.4* 9.3*  HCT 29.1* 30.4* 30.4*  MCV 93.0 92.7 92.4  MCH 28.1 28.7 28.3  MCHC 30.2 30.9 30.6  RDW 14.9 14.7 15.0  PLT 187 196 197   Thyroid  Recent Labs  Lab 11/27/21 0012  TSH 74.020*  FREET4 0.44*    BNP Recent Labs  Lab 11/26/21 0515  BNP >4,500.0*    DDimer No results for input(s): "DDIMER" in the last 168 hours.   Radiology    CARDIAC  CATHETERIZATION  Result Date: 11/28/2021   Ost LM lesion is 30% stenosed.   Prox LAD lesion is 45% stenosed.   Prox LAD to Mid LAD lesion is 65% stenosed with 90% stenosed side branch in 1st Diag.   Mid LAD lesion is 40% stenosed.  Dist LAD lesion is 50% stenosed.   Prox Cx to Mid Cx lesion is 60% stenosed.   Ost Cx to Prox Cx lesion is 30% stenosed.   Dist RCA lesion is 50% stenosed.   RV Branch lesion is 90% stenosed.  Acute Mrg lesion is 80% stenosed.   RPAV lesion is 70% stenosed with 90% stenosed side branch in 1st RPL.   LV end diastolic pressure is severely elevated.   Hemodynamic findings consistent with moderate-severe pulmonary hypertension.   There is no aortic valve stenosis. 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  LVP-EDP 162/12 mmHg - 40 mmHg, and AoP-MAP of 160/94-121 mmHg Mild to moderate reduction in Cardiac Output-Index by Thermodilution (4.13-2.23) with mild by Fick (5.07-2.74) RECOMMENDATIONS Return to nursing for ongoing care.  We will discuss with Heart Team in conference in the morning 11/29/2021 to provide further recommendations on potential treatment options.  With CABG as a potential option, would hold off on Thienopyridine antiplatelet agent for now, however if not considered to be a CABG option, would load with diet.  He regardless of PCI or not. Aggressive guideline directed medical therapy as management of risk factors. Glenetta Hew, MD   Cardiac Studies   Echo  1. Left ventricular ejection fraction, by estimation, is 40 to 45%. The  left ventricle has mildly decreased  function. The left ventricle  demonstrates global hypokinesis. Left ventricular diastolic parameters are  indeterminate. Elevated left ventricular  end-diastolic pressure.   2. Right ventricular systolic function is normal. The right ventricular  size is normal.   3. Large pleural effusion in the left lateral region.   4.  The mitral valve is normal in structure. Trivial mitral valve  regurgitation. No evidence of mitral stenosis.   5. The aortic valve is tricuspid. There is moderate calcification of the  aortic valve. Aortic valve regurgitation is not visualized. Aortic valve  sclerosis/calcification is present, without any evidence of aortic  stenosis. Aortic valve area, by VTI  measures 2.39 cm. Aortic valve mean gradient measures 3.0 mmHg. Aortic  valve Vmax measures 1.23 m/s.   6. The inferior vena cava is dilated in size with <50% respiratory  variability, suggesting right atrial pressure of 15 mmHg.   Patient Profile     41 y.o. 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  EF 40-45% by echo this admission.  Assessment & Plan    Acute hypoxic and hypercapnic respiratory failure Flash pulm edema requiring intubation in the setting of pleural effusions Underwent thoracentesis September 17,  one L out, extubated September 17 On aggressive hemodialysis regiment, last performed yesterday In the setting of low ejection fraction, cardiac catheterization right and left heart performed yesterday documenting elevated right heart pressures, severe three-vessel coronary disease   NSTEMI/Status postcardiac arrest/elevated troponins, cardiomyopathy EF 40 to 45% Troponin up to more than 500, completed heparin infusion 48  hours Continue aspirin, metoprolol Catheterization documenting severe three-vessel coronary disease Plan is to optimize fluid management with further dialysis, outpatient cardiology follow-up to achieve euvolemic state and goal-directed medical therapy, then will arrange appointment with CT surgery for further discussion concerning surgical options for coronary disease   End-stage renal disease on hemodialysis Scheduled for Tuesday Thursday Saturday Managed by nephrology   Essential hypertension Blood pressure has had moderate improvement with dialysis Appears to be tolerating Coreg 12.5 twice daily Further medication titration as outpatient Unable to initiate, ACE, ARB, Entresto in setting of renal failure    Long discussion with her concerning cardiac catheterization findings  Total encounter time more than 50 minutes  Greater than 50% was spent in counseling and coordination of care with the patient   For questions or updates, please contact [place] Please consult [email or web address] for contact info under        Signed, Ida Rogue, MD  11/29/2021, 2:51 PM

## 2021-11-29 NOTE — Plan of Care (Signed)
Patient left AMA.

## 2021-11-29 NOTE — Progress Notes (Addendum)
Nichole Wiley, [place] 11/29/21  Subjective:   Hospital day # 9  Patient known to our practice from outpatient dialysis.  Last few weeks she had purulent exit site infection with MRSA.  She was treated with intraperitoneal vancomycin and advised to get the catheter removed from her surgeon, which patient missed scheduled appt. .  Patient seen sitting up in chair Appears well today Tolerating small meals Edema greatly improved   Objective:  Vital signs in last 24 hours:  Temp:  [97.9 F (36.6 C)-99.3 F (37.4 C)] 98.9 F (37.2 C) (09/22 0400) Pulse Rate:  [41-124] 101 (09/22 0700) Resp:  [3-27] 15 (09/22 0700) BP: (92-156)/(59-107) 124/87 (09/22 0600) SpO2:  [3 %-100 %] 97 % (09/22 0700) Weight:  [74.7 kg] 74.7 kg (09/21 1130)  Weight change: -1 kg Filed Weights   11/28/21 0500 11/28/21 0752 11/28/21 1130  Weight: 77 kg 76 kg 74.7 kg    Intake/Output:    Intake/Output Summary (Last 24 hours) at 11/29/2021 1009 Last data filed at 11/28/2021 1700 Gross per 24 hour  Intake 42.73 ml  Output 2150 ml  Net -2107.27 ml      Physical Exam: General: NAD  HEENT Normocephalic, dry oral membranes  Pulm/lungs Clear to auscultation, room air, normal effort  CVS/Heart Regular rhythm, tachycardic  Abdomen:  Nondistended, tender, soft  Extremities: + pitting edema  Neurologic: Alert/oriented, able to answer simple questions  Skin: No acute rashes  Access: Left chest PermCath       Basic Metabolic Panel:  Recent Labs  Lab 11/25/21 0427 11/26/21 0515 11/27/21 0012 11/27/21 0427 11/28/21 0453 11/29/21 0536  NA 137 138  --  136 134* 137  K 3.8 3.7  --  3.4* 2.8* 3.1*  CL 97* 97*  --  98 97* 101  CO2 25 24  --  '27 26 26  '$ GLUCOSE 111* 134*  --  104* 114* 102*  BUN 22* 20  --  20 30* 25*  CREATININE 4.39* 3.76*  --  3.38* 4.84* 4.18*  CALCIUM 7.7* 8.2*  --  8.2* 8.4* 8.4*  MG 2.0 2.2 1.8  --  1.9 2.0  PHOS 5.1* 5.4*  --  3.6 3.2 3.1       CBC: Recent Labs  Lab 11/25/21 0427 11/26/21 0515 11/27/21 0012 11/28/21 0453 11/29/21 0536  WBC 16.0* 15.7* 9.9 10.1 7.9  HGB 9.3* 9.9* 8.8* 9.4* 9.3*  HCT 29.4* 32.4* 29.1* 30.4* 30.4*  MCV 90.2 92.8 93.0 92.7 92.4  PLT 183 228 187 196 197       Lab Results  Component Value Date   HEPBSAG NON REACTIVE 11/21/2021   HEPBIGM NON REACTIVE 10/01/2021      Microbiology:  Recent Results (from the past 240 hour(s))  Blood Culture (routine x 2)     Status: None   Collection Time: 11/20/21  5:54 PM   Specimen: BLOOD  Result Value Ref Range Status   Specimen Description BLOOD LEFT ANTECUBITAL  Final   Special Requests   Final    BOTTLES DRAWN AEROBIC AND ANAEROBIC Blood Culture adequate volume   Culture   Final    NO GROWTH 5 DAYS Performed at [facility], [address] [place], [place] [address]    Report Status 11/25/2021 FINAL  Final  SARS Coronavirus 2 by RT PCR (hospital order, performed in [facility] hospital lab) *cepheid single result test* Anterior Nasal Swab     Status: None   Collection Time: 11/20/21  6:14 PM   Specimen: Anterior Nasal Swab  Result Value Ref Range Status   SARS Coronavirus 2 by RT PCR NEGATIVE NEGATIVE Final    Comment: (NOTE) SARS-CoV-2 target nucleic acids are NOT DETECTED.  The SARS-CoV-2 RNA is generally detectable in upper and lower respiratory specimens during the acute phase of infection. The lowest concentration of SARS-CoV-2 viral copies this assay can detect is 250 copies / mL. A negative result does not preclude SARS-CoV-2 infection and should not be used as the sole basis for treatment or other patient management decisions.  A negative result may occur with improper specimen collection / handling, submission of specimen other than nasopharyngeal swab, presence of viral mutation(s) within the areas targeted by this assay, and inadequate number of viral copies (<250 copies / mL). A negative result must be  combined with clinical observations, patient history, and epidemiological information.  Fact Sheet for Patients:   https://www.patel.info/  Fact Sheet for Healthcare Providers: https://hall.com/  This test is not yet approved or  cleared by the [place] FDA and has been authorized for detection and/or diagnosis of SARS-CoV-2 by FDA under an Emergency Use Authorization (EUA).  This EUA will remain in effect (meaning this test can be used) for the duration of the COVID-19 declaration under Section 564(b)(1) of the Act, 21 U.S.C. section 360bbb-3(b)(1), unless the authorization is terminated or revoked sooner.  Performed at [facility], [address] [place], [place] [address]   Urine Culture     Status: None   Collection Time: 11/20/21  6:30 PM   Specimen: Urine, Random  Result Value Ref Range Status   Specimen Description   Final    URINE, RANDOM Performed at [facility], [address], [place], [place] [address]    Special Requests   Final    NONE Performed at [facility], [address] [place], [place] [address]    Culture   Final    NO GROWTH Performed at [facility], [place] [address] [place], [place] [address]    Report Status 11/22/2021 FINAL  Final  MRSA Next Gen by PCR, Nasal     Status: Abnormal   Collection Time: 11/20/21  7:50 PM   Specimen: Nasal Mucosa; Nasal Swab  Result Value Ref Range Status   MRSA by PCR Next Gen DETECTED (A) NOT DETECTED Final    Comment: RESULT CALLED TO, READ BACK BY AND VERIFIED WITH: BETH BUONO AT 2210 ON 11/20/21 BY SS (NOTE) The GeneXpert MRSA Assay (FDA approved for NASAL specimens only), is one component of a comprehensive MRSA colonization surveillance program. It is not intended to diagnose MRSA infection nor to guide or monitor treatment for MRSA infections. Test performance is not FDA approved in patients less than 87  years old. Performed at [facility], [address] [place], [place] [address]   Blood Culture (routine x 2)     Status: None   Collection Time: 11/20/21  9:55 PM   Specimen: BLOOD  Result Value Ref Range Status   Specimen Description BLOOD CENTRAL LINE  Final   Special Requests   Final    BOTTLES DRAWN AEROBIC AND ANAEROBIC Blood Culture results may not be optimal due to an inadequate volume of blood received in culture bottles   Culture   Final    NO GROWTH 5 DAYS Performed at [facility], [address] [place], [place] [address]    Report Status 11/25/2021 FINAL  Final  Cath Tip Culture     Status: None   Collection Time: 11/22/21  4:45 PM   Specimen: Catheter Tip  Result Value Ref Range Status   Specimen Description CATH TIP  Final   Special Requests   Final    NONE Performed at [facility], [address], [place], [place] [address]    Culture   Final    MODERATE METHICILLIN RESISTANT STAPHYLOCOCCUS AUREUS   Report Status 11/24/2021 FINAL  Final   Organism ID, Bacteria METHICILLIN RESISTANT STAPHYLOCOCCUS AUREUS  Final      Susceptibility   Methicillin resistant staphylococcus aureus - MIC*    CIPROFLOXACIN >=8 RESISTANT Resistant     ERYTHROMYCIN <=0.25 SENSITIVE Sensitive     GENTAMICIN <=0.5 SENSITIVE Sensitive     OXACILLIN >=4 RESISTANT Resistant     TETRACYCLINE <=1 SENSITIVE Sensitive     VANCOMYCIN 1 SENSITIVE Sensitive     TRIMETH/SULFA <=10 SENSITIVE Sensitive     CLINDAMYCIN <=0.25 SENSITIVE Sensitive     RIFAMPIN <=0.5 SENSITIVE Sensitive     Inducible Clindamycin NEGATIVE Sensitive     * MODERATE METHICILLIN RESISTANT STAPHYLOCOCCUS AUREUS  Pleural fluid culture w Gram Stain     Status: None   Collection Time: 11/24/21 11:09 AM   Specimen: Pleural Fluid  Result Value Ref Range Status   Specimen Description   Final    PLEURAL Performed at [facility], [address] [place], [place] [address]    Special  Requests   Final    NONE Performed at [facility], [place], [place] [address]    Gram Stain NO WBC SEEN NO ORGANISMS SEEN   Final   Culture   Final    NO GROWTH 3 DAYS Performed at [facility], [place] [address] [place], [place] [address]    Report Status 11/28/2021 FINAL  Final    Coagulation Studies: No results for input(s): "LABPROT", "INR" in the last 72 hours.   Urinalysis: No results for input(s): "COLORURINE", "LABSPEC", "PHURINE", "GLUCOSEU", "HGBUR", "BILIRUBINUR", "KETONESUR", "PROTEINUR", "UROBILINOGEN", "NITRITE", "LEUKOCYTESUR" in the last 72 hours.  Invalid input(s): "APPERANCEUR"     Imaging: CARDIAC CATHETERIZATION  Result Date: 11/28/2021   Ost LM lesion is 30% stenosed.   Prox LAD lesion is 45% stenosed.   Prox LAD to Mid LAD lesion is 65% stenosed with 90% stenosed side branch in 1st Diag.   Mid LAD lesion is 40% stenosed.  Dist LAD lesion is 50% stenosed.   Prox Cx to Mid Cx lesion is 60% stenosed.   Ost Cx to Prox Cx lesion is 30% stenosed.   Dist RCA lesion is 50% stenosed.   RV Branch lesion is 90% stenosed.  Acute Mrg lesion is 80% stenosed.   RPAV lesion is 70% stenosed with 90% stenosed side branch in 1st RPL.   LV end diastolic pressure is severely elevated.   Hemodynamic findings consistent with moderate-severe pulmonary hypertension.   There is no aortic valve stenosis. 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  LVP-EDP 162/12 mmHg - 40 mmHg, and AoP-MAP  of 160/94-121 mmHg Mild to moderate reduction in Cardiac Output-Index by  Thermodilution (4.13-2.23) with mild by Fick (5.07-2.74) RECOMMENDATIONS Return to nursing for ongoing care.  We will discuss with Heart Team in conference in the morning 11/29/2021 to provide further recommendations on potential treatment options.  With CABG as a potential option, would hold off on Thienopyridine antiplatelet agent for now, however if not considered to be a CABG option, would load with diet.  He regardless of PCI or not. Aggressive guideline directed medical therapy as management of risk factors. Glenetta Hew, MD    Medications:    sodium chloride     sodium chloride Stopped (11/26/21 1445)   sodium chloride      aspirin EC  81 mg Oral Daily   atorvastatin  40 mg Oral Daily   carvedilol  12.5 mg Oral BID WC   Chlorhexidine Gluconate Cloth  6 each Topical Q0600   epoetin (EPOGEN/PROCRIT) injection  4,000 Units Intravenous Q T,Th,Sa-HD   feeding supplement  237 mL Oral TID BM   insulin aspart  0-9 Units Subcutaneous Q4H   levothyroxine  50 mcg Oral Q0600   linezolid  600 mg Oral Q12H   methadone  20 mg Oral Daily   multivitamin  1 tablet Per Tube QHS   pantoprazole  40 mg Oral Daily   sodium chloride flush  3 mL Intravenous Q12H   sodium chloride flush  3 mL Intravenous Q12H   sodium chloride, sodium chloride, acetaminophen, docusate sodium, fentaNYL (SUBLIMAZE) injection, labetalol, ondansetron (ZOFRAN) IV, mouth rinse, polyethylene glycol, sodium chloride flush, sodium chloride flush  Assessment/ Plan:  41 y.o. female with end-stage renal disease on peritoneal dialysis chronic pain syndrome, history of diabetes, now diet controlled, hypertension, hyperlipidemia    admitted on 11/20/2021 for Cardiac arrest [facility]) [I46.9]  #End-stage renal disease with generalized edema Peritoneal dialysis with severe MRSA exit site infection.     Dialysis received yesterday, UF 3L achieved. Next treatment scheduled for Saturday, seated in chair.   Due to increased pressures within  permcath, we will Cathflo her permcath overnight tonight to prepare for treatment tomorrow.  Renal navigator has confirmed outpatient clinic at [facility] on a TTS schedule.     #MRSA exit site infection (11/07/21) PD catheter removed on 11/22/21.  Completed Vancomycin. Will continue receiving linezolid.   #Anemia of chronic kidney disease Lab Results  Component Value Date   HGB 9.3 (L) 11/29/2021    Received blood transfusion this admission. Continue low-dose EPO with treatments   Secondary hyperparathyroidism of renal origin Lab Results  Component Value Date   CALCIUM 8.4 (L) 11/29/2021   PHOS 3.1 11/29/2021   Patient with hypocalcemia and hyperphosphatemia. Calcium and phosphorus corrected.   Acute respiratory failure with pleural effusion, bilateral.  Seen on chest x-ray.  Thoracentesis performed at bedside on 11/24/21 with yield 900 cc.     LOS: [place] 9/22/202310:09 [place], [place]

## 2021-11-29 NOTE — Progress Notes (Signed)
Physical Therapy Treatment Patient Details Name: Nichole Wiley MRN: [id number] DOB: 1981-01-14 Today's Date: 11/29/2021   History of Present Illness Pt admitted for cardiac arrest with complaints of severe SOB and became unresponsive with cardiac arrest with intubation 9/13-9/17. History includes ESRD, chronic pain, DM, HTN, HLD.    PT Comments    Patient is a agreeable to PT. She reports walking 3 laps with staff last night. She feels like her breathing has improved significantly overall with Sp02 in the 90's on room air with hallway ambulation. Ambulation with no physical assistance required. Occasional veering to the left with safety cues required. The patient tells me she wants to be discharged home today, and is planning to stay with her mother for support at this time. PT will continue to follow while in the hospital to maximize independence and decrease caregiver burden.    Recommendations for follow up therapy are one component of a multi-disciplinary discharge planning process, led by the attending physician.  Recommendations may be updated based on patient status, additional functional criteria and insurance authorization.  Follow Up Recommendations  No PT follow up     Assistance Recommended at Discharge PRN  Patient can return home with the following Assist for transportation;Help with stairs or ramp for entrance   Equipment Recommendations  None recommended by PT    Recommendations for Other Services       Precautions / Restrictions Precautions Precautions: Fall Restrictions Weight Bearing Restrictions: No     Mobility  Bed Mobility Overal bed mobility:  (not assessed as patient sitting up on arrival and post session)                  Transfers Overall transfer level: Needs assistance Equipment used: None Transfers: Sit to/from Stand Sit to Stand: Supervision           General transfer comment: good safety awareness demonstrated     Ambulation/Gait Ambulation/Gait assistance: Min guard, Supervision Gait Distance (Feet): 300 Feet Assistive device: None Gait Pattern/deviations: Step-through pattern, Drifts right/left Gait velocity: decreased     General Gait Details: occasional unsteadiness with ambulation that did not require external support with occasional cues for safety. Sp02 remained in the 90's on room air with activity. she reports she ambulated 3 laps with nursing staff last night   Stairs             Wheelchair Mobility    Modified Rankin (Stroke Patients Only)       Balance Overall balance assessment: Needs assistance Sitting-balance support: Feet supported, Bilateral upper extremity supported Sitting balance-Leahy Scale: Good     Standing balance support: No upper extremity supported Standing balance-Leahy Scale: Fair                              Cognition Arousal/Alertness: Awake/alert Behavior During Therapy: WFL for tasks assessed/performed Overall Cognitive Status: Within Functional Limits for tasks assessed                                 General Comments: patient is able to follow all commands without difficulty        Exercises      General Comments        Pertinent Vitals/Pain Pain Assessment Pain Assessment: No/denies pain    Home Living  Prior Function            PT Goals (current goals can now be found in the care plan section) Acute Rehab PT Goals Patient Stated Goal: to go home PT Goal Formulation: With patient Time For Goal Achievement: 12/11/21 Potential to Achieve Goals: Good Progress towards PT goals: Progressing toward goals    Frequency    Min 2X/week      PT Plan Current plan remains appropriate    Co-evaluation              AM-PAC PT "6 Clicks" Mobility   Outcome Measure  Help needed turning from your back to your side while in a flat bed without using  bedrails?: None Help needed moving from lying on your back to sitting on the side of a flat bed without using bedrails?: None Help needed moving to and from a bed to a chair (including a wheelchair)?: None Help needed standing up from a chair using your arms (e.g., wheelchair or bedside chair)?: None Help needed to walk in hospital room?: A Little Help needed climbing 3-5 steps with a railing? : A Little 6 Click Score: 22    End of Session   Activity Tolerance: Patient tolerated treatment well Patient left: in chair;with call bell/phone within reach;with family/visitor present Nurse Communication: Mobility status PT Visit Diagnosis: Unsteadiness on feet (R26.81)     Time: 0600-4599 PT Time Calculation (min) (ACUTE ONLY): 17 min  Charges:  $Therapeutic Activity: 8-22 mins                     Minna Merritts, PT, MPT    Percell Locus 11/29/2021, 12:44 PM

## 2021-11-29 NOTE — Assessment & Plan Note (Signed)
Most likely secondary to sepsis and sudden cardiac arrest.  Resolved. -Continue to monitor

## 2021-11-29 NOTE — Assessment & Plan Note (Signed)
Resolved. Patient initially required ventilator support as an attempt for CPR with cardiac arrest.  Recurrent flash pulmonary edema with acute HFrEF. -Continue to monitor -Continue with supplemental oxygen-wean as tolerated.  No baseline oxygen use.

## 2021-11-29 NOTE — Discharge Summary (Signed)
Physician Discharge Summary   Patient: Nichole Wiley MRN: [id number] DOB: 03/13/1980  Admit date:     11/20/2021  Discharge date: 11/29/21  Discharge Physician: Lorella Nimrod   PCP: [facility], [name]   Patient left AMA.  Please see my today's progress note for further information.  Recommendations at discharge:   Patient left AMA stating that she talked with her dialysis center and she can go tomorrow.  According to our nephrology department that center cannot take her until Tuesday. She will be very high risk for acute exacerbation of her heart failure if not get dialysis. She also need to have a close follow-up with cardiology.   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)   Toxic metabolic encephalopathy   ESRD (end stage renal disease) on dialysis ([place])   Acute respiratory failure with hypoxia (HCC)   Pleural effusion on right   HTN (hypertension)   Hypothyroidism   Dilated cardiomyopathy ([name])  Resolved Problems:   * No resolved hospital problems. [facility] Course: ICU Transfer:  Taken from prior notes.  41 y.o. female whose medical history is most notable for chronic end-stage renal disease on peritoneal dialysis, as well as chronic pain syndrome, pelvic floor dysfunction, prior history of opioid addiction who presented to the hospital on 9/11 for the evaluation of infected peritoneal dialysis cathter. Her nephrologist is Dr. Candiss Wiley who sent her in for an evaluation. She was discharged home the following day and re-admitted on 9/13 to the ED and suffered a cardiac arrest. She was intubated and started on CRRT given multiple electrolyte abnormalities and worsening renal function.  Her plan to remove  peritoneal dialysis got delayed due to death of her fianc.  She presented to ER on 9/13 with severe shortness of breath and later became unresponsive and have an acute sudden cardiac arrest requiring resuscitation and she was placed on ventilator. She was extubated on 11/25/2021.  Developed another severe hypoxia with flash pulmonary edema on 11/26/2021 requiring BiPAP which was thought to be due to volume overload. Respiratory status improved with dialysis.  She also received IV Lasix.  Patient also received 2 days of CRRT while in ICU.  Now doing regular dialysis. Her infected PD catheter was removed by general surgery in OR on 11/22/2021.  Catheter tip growing MRSA.  Blood cultures remain negative.  Patient also received 1 unit of PRBC for anemia with appropriate response.  Echocardiogram done on 9/16 shows concern of HFrEF with EF of 40 to 45%, indeterminate diastolic parameters, normal right ventricular systolic function.  Cardiology was consulted, patient needs ischemic work-up.  TRH to resume care on 11/26/2021.  9/20: Cardiology saw her and  right and left cardiac catheterization is scheduled for tomorrow.  Repeat troponin continued to rise, currently at 4204, cardiology started her on heparin infusion.  No chest pain. Permanent tunneled catheter was placed by vascular surgery yesterday.  Psych was consulted for concern of methadone withdrawal, apparently she was taking quite high dose at [name].  This started her on a low-dose of 20 mg daily, patient with prolonged QTc and history of recent cardiac arrest, also on dialysis. Hemoglobin with 1 unit drop to 8.8 today.  Leukocytosis resolved.  9/21: Patient remained hemodynamically stable, hemoglobin  improved to 9.4, troponin trending up after decreasing.  No chest pain.  Going for right and left cardiac catheterization later today.  9/22: Right and left cardiac catheterization shows moderate to severe three-vessel diffuse coronary  artery disease without reasonable PCI targets.  Left heart and pulmonary artery pressures were severely elevated.  Cardiac surgery was consulted to discuss CABG which should be pursued in the future as outpatient.  Patient need optimization of fluid removal for her heart failure which should be obtained with dialysis. Labs seems stable except mild hypokalemia with potassium of 3.1.  Patient seems stable with no chest pain, completed heparin infusion and wants to be discharged but there is no outpatient dialysis spot available for her routine dialysis tomorrow.  Patient is pretty volume up and cardiology would like diuresed well which is being managed with dialysis in her. Patient can be discharged home after getting dialysis tomorrow morning so she can resume her routine and close outpatient cardiology follow-up.  Assessment and Plan: * Cardiac arrest [name]) Sudden cardiac arrest s/p CPR and vent support.  Extubated on 11/25/2021  Troponin elevated which peaked at 532, can be due to demand ischemia secondary to cardiac arrest.  No obvious reason. Echocardiogram with EF of 40 to 45%, global hypokinesis and indeterminate diastolic function.  Cardiology was consulted from ICU for ischemic work-up. Now also concern of NSTEMI so she was started on heparin infusion. -Going for right and left cardiac catheterization tomorrow     NSTEMI (non-ST elevated myocardial infarction) (HCC) No chest pain, at rest or with ambulation. Completed 48 hours of heparin infusion. Right and left cardiac catheterization with diffuse three-vessel disease, patient will need CABG which will be done and being evaluated as an outpatient by cardiothoracic surgery. -Continue aspirin, carvedilol and Lipitor  Acute HFrEF (heart failure with reduced ejection fraction) ([name]) In the setting of sudden cardiac arrest.Recurrent pulmonary edema which improved with IV Lasix along with dialysis. Right and left cardiac catheterization with  diffuse three-vessel disease, no targets for PCI. Patient need outpatient cardiothoracic evaluation for CABG. also found to have severely elevated left ventricular and pulmonary vascular pressures, she need optimization of her volume which is being managed with dialysis. Also need to have optimization of her medications for heart failure and CAD. Remained chest pain-free even with ambulation. -Continue carvedilol -Will get benefit from [facility]- will let cardiology decide.  Severe sepsis ([place]) Secondary to infected peritoneal catheter which was removed by general surgery on 11/22/2021.  Tip culture with MRSA.  Blood cultures remain negative. -Continue linezolid -ID on board  Methadone dependence [facility]) Patient was apparently taking quite high-dose methadone for the past 10 years, started experiencing some withdrawal so psych was consulted.  She was started on low-dose methadone at 20 mg as she was not taking it for the past many days.  Currently seems stable on this dose. 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  Resolved. -Continue to monitor  ESRD (end stage renal disease) on dialysis [facility]) Patient was initially doing peritoneal dialysis, catheter was removed on 11/22/2021 for concern of infection.  Currently getting dialysis with temporary HD catheter which is being switched to permanent catheter today. Received dialysis today with removal of 3.5 L of fluid. -Nephrology is on board -Continue with hemodialysis  Acute respiratory failure with hypoxia ([place]) Resolved. Patient initially required ventilator support as an attempt for CPR  with cardiac arrest.  Recurrent flash pulmonary edema with acute HFrEF. -Continue to monitor -Continue with supplemental oxygen-wean as tolerated.  No baseline oxygen use.  Pleural effusion on  right Most likely secondary to heart failure with recurrent pulmonary edema. S/p thoracentesis on 9/17. -Continue with supportive care  HTN (hypertension) Blood pressure mildly elevated. Also had tachycardia. Amlodipine and labetalol were listed in her chart but apparently she was not taking them. -Start her on low-dose metoprolol. -Continue to monitor  -Patient will need ACE inhibitor or ARB based on acute HFrEF, defer that decision to cardiology   Hypothyroidism TSH was elevated in May 2023 at 14.53 with low free T4 at 0.7.  Patient was apparently started on low-dose Synthroid which she was not taking for about a month.  Repeat TSH with marked elevation at 74.02 and decrease free T4 at 0.44 Patient was restarted on home dose yesterday. -Increasing the home dose to 50 -Patient will need repeat labs in 3 to 4 weeks by PCP         Consultants: Cardiology, PCCM, nephrology Procedures performed: Right and left cardiac catheterization Disposition:  Patient left AMA Diet recommendation:   DISCHARGE MEDICATION:   The results of significant diagnostics from this hospitalization (including imaging, microbiology, ancillary and laboratory) are listed below for reference.   Imaging Studies: CARDIAC CATHETERIZATION  Result Date: 11/28/2021   Ost LM lesion is 30% stenosed.   Prox LAD lesion is 45% stenosed.   Prox LAD to Mid LAD lesion is 65% stenosed with 90% stenosed side branch in 1st Diag.   Mid LAD lesion is 40% stenosed.  Dist LAD lesion is 50% stenosed.   Prox Cx to Mid Cx lesion is 60% stenosed.   Ost Cx to Prox Cx lesion is 30% stenosed.   Dist RCA lesion is 50% stenosed.   RV Branch lesion is 90% stenosed.  Acute Mrg lesion is 80% stenosed.   RPAV lesion is 70% stenosed with 90% stenosed side branch in 1st RPL.   LV end diastolic pressure is severely elevated.   Hemodynamic findings consistent with moderate-severe pulmonary hypertension.   There is no aortic valve stenosis.  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  LVP-EDP 162/12 mmHg - 40 mmHg, and AoP-MAP of 160/94-121 mmHg Mild to moderate reduction in Cardiac Output-Index by Thermodilution (4.13-2.23) with mild by Fick (5.07-2.74) RECOMMENDATIONS Return to nursing for ongoing care.  We will discuss with Heart Team in conference in the morning 11/29/2021 to provide further recommendations on potential treatment options.  With CABG as a potential option, would hold off on Thienopyridine antiplatelet agent for now, however if not considered to be a CABG option, would load with diet.  He regardless of PCI or not. Aggressive guideline directed medical therapy as management of risk factors. Glenetta Hew, MD  CT Angio Chest Pulmonary Embolism (PE) W or WO Contrast  Result Date: 11/26/2021 CLINICAL DATA:  Insert epic diagnosis and indication EXAM: CT ANGIOGRAPHY CHEST WITH CONTRAST TECHNIQUE: Multidetector CT imaging of the chest was performed using the standard protocol during bolus administration of intravenous contrast. Multiplanar CT  image reconstructions and MIPs were obtained to evaluate the vascular anatomy. RADIATION DOSE REDUCTION: This exam was performed according to the departmental dose-optimization program which includes automated exposure control, adjustment of the mA and/or kV according to patient size and/or use of iterative reconstruction technique. CONTRAST:  61m OMNIPAQUE IOHEXOL 350 MG/ML SOLN COMPARISON:  Radiograph yesterday.  Chest CTA 6 days ago FINDINGS: Cardiovascular: There are no filling defects within the  pulmonary arteries to suggest pulmonary embolus. Normal caliber thoracic aorta with mild atherosclerosis. Cannot assess for dissection given phase of contrast tailored to pulmonary artery assessment. Mild cardiomegaly. Small pericardial effusion has minimally increased. Contrast refluxes into the hepatic veins and IVC. New left-sided internal jugular dialysis catheter, tip at the atrial caval junction. Gas in the overlying left supraclavicular soft tissues likely related to recent placement. Right internal jugular central venous catheter tip in the lower SVC. Mediastinum/Nodes: No suspicious adenopathy or mass. No mediastinal hemorrhage. Interval extubation. Patulous esophagus. Lungs/Pleura: Moderate bilateral pleural effusions have slightly diminished from prior exam. There is subtotal consolidation throughout the left lower lobe. Airspace disease in the dependent right lower lobe may represent compressive atelectasis. Perihilar consolidations have improved. Persistent but improved septal thickening and patchy ground-glass typical of pulmonary edema no pneumothorax. Upper Abdomen: Mild contrast refluxing into the hepatic veins and IVC. Vascular calcifications in the upper abdomen. No acute upper abdominal findings. Musculoskeletal: Again seen buckle fracture of the sternum. No new osseous findings. Review of the MIP images confirms the above findings. IMPRESSION: 1. No pulmonary embolus. 2. Moderate bilateral pleural effusions have slightly diminished from prior exam. 3. Persistent but diminished septal thickening and patchy ground-glass typical of resolving pulmonary edema. 4. Subtotal consolidation throughout the left lower lobe with air bronchograms, atelectasis over pneumonia. Airspace disease in the dependent right lower lobe may represent compressive atelectasis. Improving perihilar consolidations. 5. New left-sided internal jugular dialysis catheter, tip at the atrial caval junction. Gas in the overlying  left supraclavicular soft tissues likely related to recent placement. 6. Mild cardiomegaly. Contrast refluxing into the hepatic veins and IVC consistent with elevated right heart pressures. Small pericardial effusion has minimally increased. Aortic Atherosclerosis (ICD10-I70.0). Electronically Signed   By: MKeith RakeM.D.   On: 11/26/2021 18:25   PERIPHERAL VASCULAR CATHETERIZATION  Result Date: 11/26/2021 See surgical note for result.  DG Chest Port 1 View  Result Date: 11/25/2021 CLINICAL DATA:  Shortness of breath EXAM: PORTABLE CHEST 1 VIEW COMPARISON:  11/24/2021 FINDINGS: Temporary dialysis catheter is again noted on the right and stable. Cardiac shadow is enlarged but stable. Increased central vascular congestion is noted with airspace opacity consistent with pulmonary edema. Small left effusion is noted. No bony abnormality is noted. IMPRESSION: Changes consistent with vascular congestion and pulmonary edema. The overall appearance is stable from the previous day. Electronically Signed   By: MInez CatalinaM.D.   On: 11/25/2021 23:45   DG Chest Port 1 View  Result Date: 11/24/2021 CLINICAL DATA:  Short of breath post thoracentesis EXAM: PORTABLE CHEST 1 VIEW COMPARISON:  Chest 11/24/2021 FINDINGS: No pneumothorax post thoracentesis. Bilateral airspace disease compatible with pulmonary edema with mild progression. Bibasilar airspace disease left greater than right unchanged. Small bilateral effusions unchanged. Central venous catheter tip at the cavoatrial junction unchanged. IMPRESSION: No pneumothorax post thoracentesis. Diffuse bilateral airspace disease compatible with pulmonary edema. Mild progression. Electronically Signed   By: CFranchot GalloM.D.   On: 11/24/2021 13:33   DG Chest Port 1 View  Result Date: 11/24/2021 CLINICAL DATA:  Chest pain, shortness of breath EXAM: PORTABLE CHEST 1 VIEW COMPARISON:  Previous studies including the examination of 11/23/2021 FINDINGS: There is  interval removal of endotracheal tube and NG tube. Tip of right IJ central venous catheter is seen at the junction of superior vena cava and right atrium. Central pulmonary vessels are prominent. Increased interstitial markings are seen in both lungs. There is small to moderate left pleural effusion. Haziness in the right lower lung field may suggest layering of small effusion. There is no pneumothorax. IMPRESSION: Cardiomegaly. CHF. Pulmonary edema. Bilateral pleural effusions, more so on the left side. Electronically Signed   By: Elmer Picker M.D.   On: 11/24/2021 11:47   ECHOCARDIOGRAM COMPLETE  Result Date: 11/23/2021    ECHOCARDIOGRAM REPORT   Patient Name:   YESENIA LOCURTO Date of Exam: 11/23/2021 Medical Rec #:  [id number]       Height:       67.0 in Accession #:    [id number]      Weight:       208.1 lb Date of Birth:  [birth year]       BSA:          2.057 m Patient Age:    21 years        BP:           123/66 mmHg Patient Gender: F               HR:           61 bpm. Exam Location:  [facility] Procedure: 2D Echo Indications:     Cardiac Arrest  History:         Patient has no prior history of Echocardiogram examinations.                  Arrythmias:LBBB; Risk Factors:Hypertension, Diabetes and CKD.  Sonographer:     L. Thornton-Maynard Referring Phys:  Bradly Bienenstock Diagnosing Phys: Fransico Him MD IMPRESSIONS  1. Left ventricular ejection fraction, by estimation, is 40 to 45%. The left ventricle has mildly decreased function. The left ventricle demonstrates global hypokinesis. Left ventricular diastolic parameters are indeterminate. Elevated left ventricular end-diastolic pressure.  2. Right ventricular systolic function is normal. The right ventricular size is normal.  3. Large pleural effusion in the left lateral region.  4. The mitral valve is normal in structure. Trivial mitral valve regurgitation. No evidence of mitral stenosis.  5. The aortic valve is tricuspid. There is moderate calcification  of the aortic valve. Aortic valve regurgitation is not visualized. Aortic valve sclerosis/calcification is present, without any evidence of aortic stenosis. Aortic valve area, by VTI measures 2.39 cm. Aortic valve mean gradient measures 3.0 mmHg. Aortic valve Vmax measures 1.23 m/s.  6. The inferior vena cava is dilated in size with <50% respiratory variability, suggesting right atrial pressure of 15 mmHg. FINDINGS  Left Ventricle: Left ventricular ejection fraction, by estimation, is 40 to 45%. The left ventricle has mildly decreased function. The left ventricle demonstrates global hypokinesis. The left ventricular internal cavity size was normal in size. There is  no left ventricular hypertrophy. Left ventricular diastolic parameters are indeterminate. Elevated left ventricular end-diastolic pressure. Right Ventricle: The right ventricular size is normal. No increase in right ventricular wall thickness. Right ventricular systolic function is normal. Left Atrium: Left atrial size was normal in size. Right Atrium: Right atrial size was normal in size. Pericardium: Trivial pericardial effusion is present. The pericardial effusion is circumferential. Mitral Valve: The mitral valve  is normal in structure. Trivial mitral valve regurgitation. No evidence of mitral valve stenosis. Tricuspid Valve: The tricuspid valve is normal in structure. Tricuspid valve regurgitation is trivial. No evidence of tricuspid stenosis. Aortic Valve: The aortic valve is tricuspid. There is moderate calcification of the aortic valve. Aortic valve regurgitation is not visualized. Aortic valve sclerosis/calcification is present, without any evidence of aortic stenosis. Aortic valve mean gradient measures 3.0 mmHg. Aortic valve peak gradient measures 6.1 mmHg. Aortic valve area, by VTI measures 2.39 cm. Pulmonic Valve: The pulmonic valve was normal in structure. Pulmonic valve regurgitation is trivial. No evidence of pulmonic stenosis. Aorta:  The aortic root is normal in size and structure. Venous: The inferior vena cava is dilated in size with less than 50% respiratory variability, suggesting right atrial pressure of 15 mmHg. IAS/Shunts: No atrial level shunt detected by color flow Doppler. Additional Comments: There is a large pleural effusion in the left lateral region.  LEFT VENTRICLE PLAX 2D LVIDd:         5.10 cm      Diastology LVIDs:         3.30 cm      LV e' medial:    5.98 cm/s LV PW:         1.30 cm      LV E/e' medial:  17.7 LV IVS:        1.10 cm      LV e' lateral:   7.72 cm/s LVOT diam:     2.00 cm      LV E/e' lateral: 13.7 LV SV:         58 LV SV Index:   28 LVOT Area:     3.14 cm  LV Volumes (MOD) LV vol d, MOD A2C: 119.0 ml LV vol d, MOD A4C: 115.0 ml LV vol s, MOD A2C: 70.2 ml LV vol s, MOD A4C: 61.4 ml LV SV MOD A2C:     48.8 ml LV SV MOD A4C:     115.0 ml LV SV MOD BP:      53.0 ml RIGHT VENTRICLE RV S prime:     10.90 cm/s TAPSE (M-mode): 1.9 cm LEFT ATRIUM             Index        RIGHT ATRIUM           Index LA diam:        3.40 cm 1.65 cm/m   RA Area:     12.10 cm LA Vol (A2C):   34.5 ml 16.77 ml/m  RA Volume:   24.70 ml  12.01 ml/m LA Vol (A4C):   49.8 ml 24.21 ml/m LA Biplane Vol: 42.5 ml 20.66 ml/m  AORTIC VALVE                    PULMONIC VALVE AV Area (Vmax):    2.35 cm     PV Vmax:       0.90 m/s AV Area (Vmean):   2.27 cm     PV Peak grad:  3.2 mmHg AV Area (VTI):     2.39 cm AV Vmax:           123.00 cm/s AV Vmean:          84.800 cm/s AV VTI:            0.243 m AV Peak Grad:      6.1 mmHg AV Mean Grad:      3.0 mmHg  LVOT Vmax:         92.20 cm/s LVOT Vmean:        61.300 cm/s LVOT VTI:          0.185 m LVOT/AV VTI ratio: 0.76  AORTA Ao Root diam: 2.90 cm Ao Asc diam:  2.90 cm MITRAL VALVE MV Area (PHT): 4.49 cm     SHUNTS MV Decel Time: 169 msec     Systemic VTI:  0.18 m MV E velocity: 106.00 cm/s  Systemic Diam: 2.00 cm MV A velocity: 82.30 cm/s MV E/A ratio:  1.29 Fransico Him MD Electronically signed by  Fransico Him MD Signature Date/Time: 11/23/2021/10:31:03 PM    Final    DG Chest Port 1 View  Result Date: 11/23/2021 CLINICAL DATA:  Acute respiratory failure. EXAM: PORTABLE CHEST 1 VIEW COMPARISON:  Chest x-ray dated November 20, 2021. FINDINGS: Unchanged endotracheal and enteric tubes. Unchanged right internal jugular dialysis catheter. Stable cardiomediastinal silhouette. Hazy density at both lung bases likely reflecting layering pleural effusions and atelectasis, similar to slightly improved. No pneumothorax. No acute osseous abnormality. IMPRESSION: 1. Stable to slightly improved layering bilateral pleural effusions and bibasilar atelectasis. Electronically Signed   By: Titus Dubin M.D.   On: 11/23/2021 09:01   DG Abd 1 View  Result Date: 11/20/2021 CLINICAL DATA:  Status post OG tube placement. EXAM: ABDOMEN - 1 VIEW COMPARISON:  November 04, 2021 FINDINGS: An orogastric tube is seen with its distal tip overlying the body of the stomach. The distal side hole sits approximately 5.1 cm distal to the expected region of the gastroesophageal junction. The bowel gas pattern is normal. No radio-opaque calculi or other significant radiographic abnormality are seen. IMPRESSION: Orogastric tube positioning, as described above. Electronically Signed   By: Virgina Norfolk M.D.   On: 11/20/2021 21:45   DG Chest 1 View  Result Date: 11/20/2021 CLINICAL DATA:  Status post line placement. EXAM: CHEST  1 VIEW COMPARISON:  November 20, 2021 FINDINGS: There is stable endotracheal tube and nasogastric tube positioning. Interval right internal jugular venous catheter placement is seen with its distal tip at the junction of the superior vena cava and right atrium. The cardiac silhouette is mildly enlarged and unchanged in size. Stable marked severity bilateral airspace disease is seen. Small bilateral pleural effusions are again noted. No pneumothorax is identified. The visualized skeletal structures are  unremarkable. IMPRESSION: 1. Interval right internal jugular venous catheter placement positioning, as described above. 2. Stable endotracheal tube and nasogastric tube positioning. Withdrawal of the ET tube by approximately 2 cm is recommended. 3. Stable marked severity bilateral airspace disease. Electronically Signed   By: Virgina Norfolk M.D.   On: 11/20/2021 21:44   CT Angio Chest PE W/Cm &/Or Wo Cm  Result Date: 11/20/2021 CLINICAL DATA:  Cardiac arrest; respiratory distress rule out PE EXAM: CT ANGIOGRAPHY CHEST WITH CONTRAST TECHNIQUE: Multidetector CT imaging of the chest was performed using the standard protocol during bolus administration of intravenous contrast. Multiplanar CT image reconstructions and MIPs were obtained to evaluate the vascular anatomy. RADIATION DOSE REDUCTION: This exam was performed according to the departmental dose-optimization program which includes automated exposure control, adjustment of the mA and/or kV according to patient size and/or use of iterative reconstruction technique. CONTRAST:  35m OMNIPAQUE IOHEXOL 350 MG/ML SOLN COMPARISON:  Radiographs earlier today FINDINGS: Cardiovascular: Satisfactory opacification of the pulmonary arteries to the segmental level. No evidence of pulmonary embolism. Mild cardiomegaly. Trace pericardial effusion. Aortic and coronary artery atherosclerotic calcification. Mediastinum/Nodes: No  enlarged mediastinal, hilar, or axillary lymph nodes. ETT within the low intrathoracic trachea projecting toward the right mainstem bronchus with tip 6 mm from the carina. Subdiaphragmatic enteric tube. Lungs/Pleura: Moderate-large bilateral pleural effusions and associated atelectasis/consolidation. Interlobular septal thickening greatest in the upper lungs. Diffuse peribronchovascular consolidation. Upper Abdomen: No acute abnormality. Musculoskeletal: Buckle fracture of the anterior sternal wall. The posterior sternal wall is intact. No acute rib  fracture. Review of the MIP images confirms the above findings. IMPRESSION: 1. Negative for acute pulmonary embolism. 2. Moderate-large bilateral pleural effusions. 3. Peribronchovascular infiltrates bilaterally favored infectious/inflammatory likely with superimposed edema. 4. The ETT tip is within the low intrathoracic trachea approximately 6 mm from the carina. Recommend repositioning. 5. Buckle fracture of the anterior sternal wall. The posterior sternal wall is intact. 6. Cardiomegaly. 7. Aortic and coronary artery atherosclerotic calcification Electronically Signed   By: Placido Sou M.D.   On: 11/20/2021 19:40   CT HEAD WO CONTRAST (5MM)  Result Date: 11/20/2021 CLINICAL DATA:  New onset seizure EXAM: CT HEAD WITHOUT CONTRAST TECHNIQUE: Contiguous axial images were obtained from the base of the skull through the vertex without intravenous contrast. RADIATION DOSE REDUCTION: This exam was performed according to the departmental dose-optimization program which includes automated exposure control, adjustment of the mA and/or kV according to patient size and/or use of iterative reconstruction technique. COMPARISON:  None Available. FINDINGS: Brain: No evidence of acute infarction, hemorrhage, hydrocephalus, extra-axial collection or mass lesion/mass effect. Vascular: Negative for hyperdense vessel Skull: Negative Sinuses/Orbits: Paranasal sinuses clear.  Negative orbit Other: None IMPRESSION: Negative CT head Electronically Signed   By: Franchot Gallo M.D.   On: 11/20/2021 19:34   DG Chest Portable 1 View  Result Date: 11/20/2021 CLINICAL DATA:  Intubation.  ET tube, OG tube placement EXAM: PORTABLE CHEST 1 VIEW COMPARISON:  11/18/2021 FINDINGS: Endotracheal tube 1.5 cm above the carina. OG tube is in the stomach. Cardiomegaly. Diffuse bilateral airspace disease, worsening since prior study, favor edema. Suspect small right effusion. No acute bony abnormality. IMPRESSION: Worsening bilateral airspace  disease most likely edema/CHF. Small right effusion. Electronically Signed   By: Rolm Baptise M.D.   On: 11/20/2021 18:13   DG Chest 2 View  Result Date: 11/18/2021 CLINICAL DATA:  Chest pain and dyspnea EXAM: CHEST - 2 VIEW COMPARISON:  Radiographs 10/30/2021 FINDINGS: Similar cardiomegaly decreased pulmonary vascular and interstitial. Decreased streaky right basilar opacities. Small right pleural effusion. IMPRESSION: 1. Decreased streaky opacities in the right lung base. 2. Similar cardiomegaly. Decreased pulmonary vascular congestion and interstitial. 3. Small right pleural effusion. Electronically Signed   By: Placido Sou M.D.   On: 11/18/2021 20:29   DG Abd 1 View  Result Date: 11/05/2021 CLINICAL DATA:  Abdominal pain. EXAM: ABDOMEN - 1 VIEW COMPARISON:  September 28, 2015 FINDINGS: The bowel gas pattern is normal. Extensive bowel content is identified throughout colon. Tubular structure projects over the right abdomen. IMPRESSION: No bowel obstruction. Extensive bowel content identified throughout colon consistent with constipation. Electronically Signed   By: Abelardo Diesel M.D.   On: 11/05/2021 13:12   DG Chest Portable 1 View  Result Date: 10/30/2021 CLINICAL DATA:  Weakness EXAM: PORTABLE CHEST 1 VIEW COMPARISON:  10/13/2021, 09/30/2021 FINDINGS: Cardiomegaly with vascular congestion and mild diffuse interstitial opacity. Small right effusion. Streaky right basilar opacity. IMPRESSION: 1. Mild cardiomegaly with slight central congestion and minimal edema. Small right-sided effusion. 2. Streaky basilar opacity on the right suggestive of atelectasis Electronically Signed   By: Donavan Foil  M.D.   On: 10/30/2021 17:53    Microbiology: Results for orders placed or performed during the hospital encounter of 11/20/21  Blood Culture (routine x 2)     Status: None   Collection Time: 11/20/21  5:54 PM   Specimen: BLOOD  Result Value Ref Range Status   Specimen Description BLOOD LEFT ANTECUBITAL   Final   Special Requests   Final    BOTTLES DRAWN AEROBIC AND ANAEROBIC Blood Culture adequate volume   Culture   Final    NO GROWTH 5 DAYS Performed at [facility], [address] [place], [place] [address]    Report Status 11/25/2021 FINAL  Final  SARS Coronavirus 2 by RT PCR (hospital order, performed in [facility] hospital lab) *cepheid single result test* Anterior Nasal Swab     Status: None   Collection Time: 11/20/21  6:14 PM   Specimen: Anterior Nasal Swab  Result Value Ref Range Status   SARS Coronavirus 2 by RT PCR NEGATIVE NEGATIVE Final    Comment: (NOTE) SARS-CoV-2 target nucleic acids are NOT DETECTED.  The SARS-CoV-2 RNA is generally detectable in upper and lower respiratory specimens during the acute phase of infection. The lowest concentration of SARS-CoV-2 viral copies this assay can detect is 250 copies / mL. A negative result does not preclude SARS-CoV-2 infection and should not be used as the sole basis for treatment or other patient management decisions.  A negative result may occur with improper specimen collection / handling, submission of specimen other than nasopharyngeal swab, presence of viral mutation(s) within the areas targeted by this assay, and inadequate number of viral copies (<250 copies / mL). A negative result must be combined with clinical observations, patient history, and epidemiological information.  Fact Sheet for Patients:   https://www.patel.info/  Fact Sheet for Healthcare Providers: https://hall.com/  This test is not yet approved or  cleared by the [place] FDA and has been authorized for detection and/or diagnosis of SARS-CoV-2 by FDA under an Emergency Use Authorization (EUA).  This EUA will remain in effect (meaning this test can be used) for the duration of the COVID-19 declaration under Section 564(b)(1) of the Act, 21 U.S.C. section 360bbb-3(b)(1), unless the  authorization is terminated or revoked sooner.  Performed at [facility], [address], [place], [place] [address]   Urine Culture     Status: None   Collection Time: 11/20/21  6:30 PM   Specimen: Urine, Random  Result Value Ref Range Status   Specimen Description   Final    URINE, RANDOM Performed at [facility], [address], [place], [place] [address]    Special Requests   Final    NONE Performed at [facility], [address], [place], [place] [address]    Culture   Final    NO GROWTH Performed at [facility], [place] [address], [place],  [address]    Report Status 11/22/2021 FINAL  Final  MRSA Next Gen by PCR, Nasal     Status: Abnormal   Collection Time: 11/20/21  7:50 PM   Specimen: Nasal Mucosa; Nasal Swab  Result Value Ref Range Status   MRSA by PCR Next Gen DETECTED (A) NOT DETECTED Final    Comment: RESULT CALLED TO, READ BACK BY AND VERIFIED WITH: BETH BUONO AT 2210 ON 11/20/21 BY SS (NOTE) The GeneXpert MRSA Assay (FDA approved for NASAL specimens only), is one component of a comprehensive MRSA colonization surveillance program. It is  not intended to diagnose MRSA infection nor to guide or monitor treatment for MRSA infections. Test performance is not FDA approved in patients less than 80 years old. Performed at [facility], [address] [place], [place] [address]   Blood Culture (routine x 2)     Status: None   Collection Time: 11/20/21  9:55 PM   Specimen: BLOOD  Result Value Ref Range Status   Specimen Description BLOOD CENTRAL LINE  Final   Special Requests   Final    BOTTLES DRAWN AEROBIC AND ANAEROBIC Blood Culture results may not be optimal due to an inadequate volume of blood received in culture bottles   Culture   Final    NO GROWTH 5 DAYS Performed at [facility], [address] [place], [place] [address]    Report Status 11/25/2021 FINAL  Final  Cath Tip Culture      Status: None   Collection Time: 11/22/21  4:45 PM   Specimen: Catheter Tip  Result Value Ref Range Status   Specimen Description CATH TIP  Final   Special Requests   Final    NONE Performed at [facility], [place] [address], [place], [place] [address]    Culture   Final    MODERATE METHICILLIN RESISTANT STAPHYLOCOCCUS AUREUS   Report Status 11/24/2021 FINAL  Final   Organism ID, Bacteria METHICILLIN RESISTANT STAPHYLOCOCCUS AUREUS  Final      Susceptibility   Methicillin resistant staphylococcus aureus - MIC*    CIPROFLOXACIN >=8 RESISTANT Resistant     ERYTHROMYCIN <=0.25 SENSITIVE Sensitive     GENTAMICIN <=0.5 SENSITIVE Sensitive     OXACILLIN >=4 RESISTANT Resistant     TETRACYCLINE <=1 SENSITIVE Sensitive     VANCOMYCIN 1 SENSITIVE Sensitive     TRIMETH/SULFA <=10 SENSITIVE Sensitive     CLINDAMYCIN <=0.25 SENSITIVE Sensitive     RIFAMPIN <=0.5 SENSITIVE Sensitive     Inducible Clindamycin NEGATIVE Sensitive     * MODERATE METHICILLIN RESISTANT STAPHYLOCOCCUS AUREUS  Pleural fluid culture w Gram Stain     Status: None   Collection Time: 11/24/21 11:09 AM   Specimen: Pleural Fluid  Result Value Ref Range Status   Specimen Description   Final    PLEURAL Performed at [facility], [address] [place], [place] [address]    Special Requests   Final    NONE Performed at [facility], [place], [place] [address]    Gram Stain NO WBC SEEN NO ORGANISMS SEEN   Final   Culture   Final    NO GROWTH 3 DAYS Performed at [facility], [place] [address] [place], [place] [address]    Report Status 11/28/2021 FINAL  Final    Labs: CBC: Recent Labs  Lab 11/25/21 0427 11/26/21 0515 11/27/21 0012 11/28/21 0453 11/29/21 0536  WBC 16.0* 15.7* 9.9 10.1 7.9  HGB 9.3* 9.9* 8.8* 9.4* 9.3*  HCT 29.4* 32.4* 29.1* 30.4* 30.4*  MCV 90.2 92.8 93.0 92.7 92.4  PLT 183 228 187 196 694   Basic Metabolic Panel: Recent Labs  Lab  11/25/21 0427 11/26/21 0515 11/27/21 0012 11/27/21 0427 11/28/21 0453 11/29/21 0536  NA 137 138  --  136 134* 137  K 3.8 3.7  --  3.4* 2.8* 3.1*  CL 97* 97*  --  98 97* 101  CO2 25 24  --  '27 26 26  '$ GLUCOSE 111* 134*  --  104* 114* 102*  BUN 22* 20  --  20 30* 25*  CREATININE 4.39* 3.76*  --  3.38* 4.84* 4.18*  CALCIUM 7.7* 8.2*  --  8.2* 8.4* 8.4*  MG 2.0 2.2 1.8  --  1.9 2.0  PHOS 5.1* 5.4*  --  3.6 3.2 3.1   Liver Function Tests: Recent Labs  Lab 11/25/21 0427 11/26/21 0515 11/27/21 0427 11/28/21 0453 11/29/21 0536  ALBUMIN 1.9* 2.3* 2.1* 2.4* 2.5*   CBG: Recent Labs  Lab 11/28/21 1930 11/29/21 0024 11/29/21 0408 11/29/21 0755 11/29/21 1134  GLUCAP 201* 114* 104* 110* 212*    Discharge time spent:  30 minutes.  Signed: Lorella Nimrod, MD [facility] 11/29/2021

## 2021-11-29 NOTE — Progress Notes (Signed)
[facility] Interventional Cardiology Note  Date: 11/29/21 Today: 7:30 AM  The patient's catheterization results were discussed at interventional cardiology/cardiac surgery "Heart Team" meeting today.  Coronary angiography shows moderate to severe three-vessel coronary artery disease that is diffuse without reasonable PCI target.  Left heart and pulmonary artery pressures were severely elevated.  Cardiac surgery consultation for to discuss CABG with the patient could be pursued in the future, though [name] needs further fluid removal via dialysis as well as medical optimization.  The heart team recommends close outpatient follow-up with cardiology in the office and outpatient consultation with cardiac surgery once she has established a routine hemodialysis regimen and optimized on a heart failure/antianginal regimen.  Nichole Bush, MD [facility]

## 2021-11-29 NOTE — Assessment & Plan Note (Signed)
In the setting of sudden cardiac arrest.Recurrent pulmonary edema which improved with IV Lasix along with dialysis. Right and left cardiac catheterization with diffuse three-vessel disease, no targets for PCI. Patient need outpatient cardiothoracic evaluation for CABG. also found to have severely elevated left ventricular and pulmonary vascular pressures, she need optimization of her volume which is being managed with dialysis. Also need to have optimization of her medications for heart failure and CAD. Remained chest pain-free even with ambulation. -Continue carvedilol -Will get benefit from [facility]- will let cardiology decide.

## 2021-11-30 LAB — LIPOPROTEIN A (LPA): Lipoprotein (a): 86.3 nmol/L — ABNORMAL HIGH (ref <75.0)

## 2021-12-03 LAB — MISC LABCORP TEST (SEND OUT): Labcorp test code: 9985

## 2021-12-04 LAB — BLOOD GAS, ARTERIAL
Acid-base deficit: 0.9 mmol/L (ref 0.0–2.0)
Bicarbonate: 22 mmol/L (ref 20.0–28.0)
Delivery systems: POSITIVE
Expiratory PAP: 8 cmH2O
FIO2: 100 %
Inspiratory PAP: 10 cmH2O
O2 Saturation: 93.4 %
Patient temperature: 37
pCO2 arterial: 31 mmHg — ABNORMAL LOW (ref 32–48)
pH, Arterial: 7.46 — ABNORMAL HIGH (ref 7.35–7.45)
pO2, Arterial: 60 mmHg — ABNORMAL LOW (ref 83–108)

## 2021-12-11 ENCOUNTER — Encounter: Payer: Self-pay | Admitting: Internal Medicine

## 2021-12-11 ENCOUNTER — Ambulatory Visit: Payer: Medicaid Other | Attending: Internal Medicine | Admitting: Internal Medicine

## 2021-12-11 ENCOUNTER — Other Ambulatory Visit
Admission: RE | Admit: 2021-12-11 | Discharge: 2021-12-11 | Disposition: A | Discharge status: Home / Self Care | Payer: Medicaid Other | Source: Ambulatory Visit | Attending: Internal Medicine | Admitting: Internal Medicine

## 2021-12-11 VITALS — BP 142/74 | HR 96 | Ht 67.0 in | Wt 176.6 lb

## 2021-12-11 DIAGNOSIS — I469 Cardiac arrest, cause unspecified: Secondary | ICD-10-CM | POA: Insufficient documentation

## 2021-12-11 DIAGNOSIS — E782 Mixed hyperlipidemia: Secondary | ICD-10-CM | POA: Diagnosis present

## 2021-12-11 DIAGNOSIS — I10 Essential (primary) hypertension: Secondary | ICD-10-CM | POA: Diagnosis present

## 2021-12-11 DIAGNOSIS — N186 End stage renal disease: Secondary | ICD-10-CM | POA: Insufficient documentation

## 2021-12-11 DIAGNOSIS — E1069 Type 1 diabetes mellitus with other specified complication: Secondary | ICD-10-CM | POA: Diagnosis present

## 2021-12-11 DIAGNOSIS — R9431 Abnormal electrocardiogram [ECG] [EKG]: Secondary | ICD-10-CM | POA: Diagnosis present

## 2021-12-11 DIAGNOSIS — I2510 Atherosclerotic heart disease of native coronary artery without angina pectoris: Secondary | ICD-10-CM | POA: Diagnosis present

## 2021-12-11 DIAGNOSIS — I5021 Acute systolic (congestive) heart failure: Secondary | ICD-10-CM | POA: Diagnosis present

## 2021-12-11 LAB — BASIC METABOLIC PANEL
Anion gap: 11 (ref 5–15)
BUN: 26 mg/dL — ABNORMAL HIGH (ref 6–20)
CO2: 25 mmol/L (ref 22–32)
Calcium: 8.4 mg/dL — ABNORMAL LOW (ref 8.9–10.3)
Chloride: 101 mmol/L (ref 98–111)
Creatinine, Ser: 6.53 mg/dL — ABNORMAL HIGH (ref 0.44–1.00)
GFR, Estimated: 8 mL/min — ABNORMAL LOW (ref >60)
Glucose, Bld: 211 mg/dL — ABNORMAL HIGH (ref 70–99)
Potassium: 3.5 mmol/L (ref 3.5–5.1)
Sodium: 137 mmol/L (ref 135–145)

## 2021-12-11 LAB — MAGNESIUM: Magnesium: 1.9 mg/dL (ref 1.7–2.4)

## 2021-12-11 MED ORDER — LOSARTAN POTASSIUM 25 MG PO TABS: 25.0000 mg | ORAL_TABLET | Freq: Every day | ORAL | 3 refills | Status: AC

## 2021-12-11 NOTE — Patient Instructions (Signed)
Medication Instructions:   Your physician has recommended you make the following change in your medication:    START taking your Torsemide 100 MG once a day.  2.    TAKE your Metolazone only as needed for swelling or shortness of breath. You would  take 1 tablet 30 mins prior to your Torsemide dose.  3.     START taking Losartan 25 MG once a day.   *If you need a refill on your cardiac medications before your next appointment, please call your pharmacy*   Lab Work:  Please go to the [facility] for a lab draw (BMP, Magnesium) after your appointment today.   Follow-Up: At [facility], you and your health needs are our priority.  As part of our continuing mission to provide you with exceptional heart care, we have created designated Provider Care Teams.  These Care Teams include your primary Cardiologist (physician) and Advanced Practice Providers (APPs -  Physician Assistants and Nurse Practitioners) who all work together to provide you with the care you need, when you need it.  We recommend signing up for the patient portal called "MyChart".  Sign up information is provided on this After Visit Summary.  MyChart is used to connect with patients for Virtual Visits (Telemedicine).  Patients are able to view lab/test results, encounter notes, upcoming appointments, etc.  Non-urgent messages can be sent to your provider as well.   To learn more about what you can do with MyChart, go to [email or web address].    Your next appointment:   2-3 week(s)  The format for your next appointment:   In Person  Provider:   You may see Nelva Bush, MD or one of the following Advanced Practice Providers on your designated Care Team:   Murray Hodgkins, NP Christell Faith, PA-C Cadence Kathlen Mody, PA-C Gerrie Nordmann, NP    Other Instructions   Important Information About Sugar

## 2021-12-11 NOTE — Progress Notes (Signed)
Follow-up Outpatient Visit Date: 12/11/2021  Primary Care Provider: [name] [place] [address]  Chief Complaint: Follow-up CAD and HFrEF  HPI:  Ms. Bodkins is a 41 y.o. female with history of longstanding diabetes mellitus complicated by Lawrie Tunks-stage renal disease previously on peritoneal dialysis complicated by PD catheter failure and peritonitis requiring transition to hemodialysis, severe multivessel coronary artery disease, ischemic cardiomyopathy with recent acute heart failure with mid range ejection fraction, type 1 diabetes mellitus, chronic pain, and prior opioid addiction on chronic methadone, who presents for follow-up of coronary artery disease and heart failure.  She was hospitalized last month at [facility] due to worsening fluid retention in the setting of PD catheter failure.  In the ED, she had cardiac arrest, attributed to marked electrolyte/metabolic derangements.  She had a significant troponin elevation, which led to cardiac catheterization following stabilization of her renal failure and respiratory failure.  Catheterization showed moderate to severe multivessel disease as well as moderately-severely elevated left heart and pulmonary artery pressures.  Her case was reviewed at our heart team meeting and it was felt that the patient needed further medical optimization, at which point outpatient cardiac surgery consultation could be considered.  Today, Ms. Mcdonagh reports that she is feeling fairly well, much better than during her hospitalization last month.  Her energy continues to improve.  Her leg swelling is also getting better, though it has not entirely resolved.  She still has some mild shortness of breath at times as well as chest wall soreness from CPR.  She does not have any chest pain with activity.  She denies palpitations, lightheadedness, and syncope.  She is on methadone 20 mg daily, which is a significant reduction in dose from her prior regimen before  her hospitalization.  She is concerned about withdrawal symptoms.  She reports being compliant with her medications, though on further questioning, she has not restarted every other day metolazone nor daily torsemide as instructed at hospital discharge.  She is going to dialysis 3 days a week and is tolerating this well without hypotension.  --------------------------------------------------------------------------------------------------  Past Medical History:  Diagnosis Date   Abdominal mass    Arthritis    back   Chronic kidney disease    Colon polyp    Coronary artery disease 11/2021   Diabetes 1.5, managed as type 1 ([place])    Drug addiction ([place])    hx (pain pills after c-sect)   GERD (gastroesophageal reflux disease)    Heart failure with mildly reduced ejection fraction (HFmrEF) ([place]) 11/2021   Hypertension    Pelvic floor dysfunction    Urine incontinence    Past Surgical History:  Procedure Laterality Date   ABDOMINAL HYSTERECTOMY  2005   CAPD REMOVAL N/A 11/22/2021   Procedure: CONTINUOUS AMBULATORY PERITONEAL DIALYSIS  (CAPD) CATHETER REMOVAL;  Surgeon: Benjamine Sprague, DO;  Location: [facility];  Service: General;  Laterality: N/A;   CESAREAN SECTION  2003   COLONOSCOPY WITH PROPOFOL N/A 09/14/2015   Procedure: COLONOSCOPY WITH PROPOFOL;  Surgeon: Lucilla Lame, MD;  Location: [place];  Service: Endoscopy;  Laterality: N/A;  Diabetic - insulin   CYSTOSCOPY W/ RETROGRADES Right 07/06/2017   Procedure: CYSTOSCOPY WITH RETROGRADE PYELOGRAM;  Surgeon: Hollice Espy, MD;  Location: [facility];  Service: Urology;  Laterality: Right;   CYSTOSCOPY W/ URETERAL STENT PLACEMENT Right 06/06/2017   Procedure: CYSTOSCOPY WITH RETROGRADE PYELOGRAM/URETERAL STENT PLACEMENT;  Surgeon: Lucas Mallow, MD;  Location: [facility];  Service: Urology;  Laterality: Right;   CYSTOSCOPY W/ URETERAL STENT PLACEMENT Right 07/06/2017   Procedure: CYSTOSCOPY WITH STENT REPLACEMENT;  Surgeon: Hollice Espy, MD;  Location: [facility];  Service: Urology;  Laterality: Right;   DIALYSIS/PERMA CATHETER INSERTION N/A 11/26/2021   Procedure: DIALYSIS/PERMA CATHETER INSERTION;  Surgeon: Katha Cabal, MD;  Location: [place] [facility];  Service: Cardiovascular;  Laterality: N/A;   ESOPHAGOGASTRODUODENOSCOPY (EGD) WITH PROPOFOL N/A 06/19/2016   Procedure: ESOPHAGOGASTRODUODENOSCOPY (EGD) WITH PROPOFOL;  Surgeon: Jonathon Bellows, MD;  Location: [facility];  Service: Endoscopy;  Laterality: N/A;   LAPAROSCOPY     LYSIS OF ADHESION     POLYPECTOMY  09/14/2015   Procedure: POLYPECTOMY;  Surgeon: Lucilla Lame, MD;  Location: [facility];  Service: Endoscopy;;   RIGHT OOPHORECTOMY  2012   RIGHT/LEFT HEART CATH AND CORONARY ANGIOGRAPHY N/A 11/28/2021   Procedure: RIGHT/LEFT HEART CATH AND CORONARY ANGIOGRAPHY;  Surgeon: Leonie Man, MD;  Location: [place] [facility];  Service: Cardiovascular;  Laterality: N/A;   TEMPORARY DIALYSIS CATHETER N/A 10/01/2021   Procedure: TEMPORARY DIALYSIS CATHETER;  Surgeon: Algernon Huxley, MD;  Location: [place] [facility];  Service: Cardiovascular;  Laterality: N/A;   TONSILLECTOMY  1987   and adenoids   TUBAL LIGATION  2004   VULVECTOMY  2011   lipoma    Current Meds  Medication Sig   aspirin EC 81 MG tablet Take 81 mg by mouth daily.   atorvastatin (LIPITOR) 80 MG tablet Take 80 mg by mouth daily.   carvedilol (COREG) 12.5 MG tablet Take 12.5 mg by mouth 2 (two) times daily.   levothyroxine (SYNTHROID) 25 MCG tablet Take 1 tablet (25 mcg total) by mouth daily.   methadone (DOLOPHINE) 10 MG/ML solution Take 20 mg by mouth daily. Changed by Dr Posey Pronto to get the right home dose w/o sending rx   metolazone (ZAROXOLYN) 2.5 MG tablet Take 1 tablet (2.5 mg total) by mouth every other day.   nicotine (NICODERM CQ - DOSED IN MG/24 HOURS) 21 mg/24hr patch 21 mg daily.   [DISCONTINUED] methadone (DOLOPHINE) 10 MG tablet Take 20 mg by mouth daily.    Allergies:  Cephalexin and Penicillins  Social History   Tobacco Use   Smoking status: Every Day    Packs/day: 0.25    Years: 24.00    Total pack years: 6.00    Types: Cigarettes   Smokeless tobacco: Never   Tobacco comments:    has cut down from PPD   Vaping Use   Vaping Use: Never used  Substance Use Topics   Alcohol use: No   Drug use: No    Family History  Problem Relation Age of Onset   Depression Mother    Diabetes Father    Heart disease Maternal Grandmother    Depression Maternal Grandmother    Dementia Maternal Grandmother    Diabetes Maternal Grandmother    Diabetes Paternal Grandmother     Review of Systems: A 12-system review of systems was performed and was negative except as noted in the HPI.  --------------------------------------------------------------------------------------------------  Physical Exam: BP (!) 142/74 (BP Location: Left Arm, Patient Position: Sitting)   Pulse 96   Ht '5\' 7"'$  (1.702 m)   Wt 176 lb 9.6 oz (80.1 kg)   SpO2 98%   BMI 27.66 kg/m   General:  NAD. Neck: No JVD or HJR.  Left internal jugular tunneled hemodialysis catheter in place. Lungs: Clear to auscultation bilaterally without wheezes or crackles. Heart: Regular rate and  rhythm without murmurs, rubs, or gallops. Abdomen: Soft, nontender, nondistended.  Abdominal wall subcutaneous nodules noted at site of prior peritoneal dialysis catheter. Extremities: 1-2+ woody edema in both calves.  EKG: Normal sinus rhythm with anterolateral T wave inversions, nonspecific ST segment changes, and mild QT prolongation (QTc 510 ms).  Compared with prior tracing from 11/21/2021, anterolateral T wave inversions are new.  Lab Results  Component Value Date   WBC 7.9 11/29/2021   HGB 9.3 (L) 11/29/2021   HCT 30.4 (L) 11/29/2021   MCV 92.4 11/29/2021   PLT 197 11/29/2021    Lab Results  Component Value Date   NA 137 11/29/2021   K 3.1 (L) 11/29/2021   CL 101 11/29/2021   CO2 26 11/29/2021    BUN 25 (H) 11/29/2021   CREATININE 4.18 (H) 11/29/2021   GLUCOSE 102 (H) 11/29/2021   ALT 23 11/20/2021    Lab Results  Component Value Date   CHOL 321 (H) 11/26/2021   HDL 50 11/26/2021   LDLCALC 239 (H) 11/26/2021   TRIG 160 (H) 11/26/2021   CHOLHDL 6.4 11/26/2021    --------------------------------------------------------------------------------------------------  ASSESSMENT AND PLAN: Cardiac arrest and QT prolongation: Occurred in the setting of ineffective peritoneal dialysis and marked electrolyte/metabolic derangements.  QT prolongation is again noted on EKG today, similar to when she presented with cardiac arrest.  I worry that electrolyte abnormalities and continued methadone use are contributing.  Ms. Colvin asks about increasing her methadone to help combat opioid withdrawal symptoms.  I advised against this and would favor weaning her off methadone altogether, as tolerated.  We will recheck a BMP and magnesium level today.  I would favor targeting potassium and magnesium levels around 4.0 and 2.0, respectively.  Coronary artery disease without angina: Patient was recently hospitalized with cardiac arrest and found to have significantly elevated troponin as well as moderately reduced LVEF.  Catheterization showed moderate to severe multivessel CAD most suggestive of supply-demand mismatch.  No good target for PCI was identified.  If she were to have recurrent angina or further decline in her LVEF, surgical revascularization would be an option based on heart team discussion.  However, her volume status needs to be improved with continued hemodialysis.  Her chronic pain and opioid dependence will also need to optimized.  Continue aspirin and atorvastatin for secondary prevention.  Acute HFrEF: Echo during recent admission showed LVEF of 40-45% in the setting of multivessel CAD.  I have asked Ms. Cratty to restart torsemide 100 mg daily.  She can take metolazone 2.5 mg every other day as  needed for inadequate response to torsemide alone as long as she still has leg edema.  She will need to continue hemodialysis to assist with volume management.  We will continue labetalol 100 mg 3 times daily for now, though long-term I would favor transitioning her to an evidence-based beta-blocker.  We will add losartan 25 mg daily.  I will check a BMP and magnesium level today.  BMP will need to be repeated when she follows up in 2-3 weeks.  Aldosterone antagonist and SGLT2 inhibitor are precluded by her ESRD.  Hyperlipidemia associated with type 1 diabetes mellitus: Continue atorvastatin 80 mg daily.  Ongoing management of DM per PCP.  ESRD: Continue hemodialysis per nephrology.  Based on BMP results, I will reach out to [name] to consider allowing her potassium to be a bit higher in order to help minimize QT prolongation.  Polysubstance abuse: Ms. Rapaport has a history of opioid dependence  and is on methadone.  As outlined above, I do not think she would do well with escalation of her methadone at this time given that her QT interval is already slightly prolonged.  Continued weaning versus transitioning to another agent such as Suboxone should be considered.  I encouraged Ms. Fuerstenberg to discuss this with her methadone dispenser.  Follow-up: Return to clinic in 2-3 weeks.  Nelva Bush, MD 12/11/2021 11:14 AM

## 2021-12-18 ENCOUNTER — Emergency Department: Payer: Medicaid Other

## 2021-12-18 ENCOUNTER — Emergency Department
Admission: EM | Admit: 2021-12-18 | Discharge: 2021-12-18 | Discharge status: Against Medical Advice | Payer: Medicaid Other | Attending: Emergency Medicine | Admitting: Emergency Medicine

## 2021-12-18 ENCOUNTER — Other Ambulatory Visit: Payer: Self-pay

## 2021-12-18 DIAGNOSIS — R0602 Shortness of breath: Secondary | ICD-10-CM | POA: Diagnosis present

## 2021-12-18 DIAGNOSIS — Z5321 Procedure and treatment not carried out due to patient leaving prior to being seen by health care provider: Secondary | ICD-10-CM | POA: Diagnosis not present

## 2021-12-18 DIAGNOSIS — Z992 Dependence on renal dialysis: Secondary | ICD-10-CM | POA: Diagnosis not present

## 2021-12-18 LAB — CBC
HCT: 34.8 % — ABNORMAL LOW (ref 36.0–46.0)
Hemoglobin: 10.7 g/dL — ABNORMAL LOW (ref 12.0–15.0)
MCH: 28.5 pg (ref 26.0–34.0)
MCHC: 30.7 g/dL (ref 30.0–36.0)
MCV: 92.6 fL (ref 80.0–100.0)
Platelets: 243 10*3/uL (ref 150–400)
RBC: 3.76 MIL/uL — ABNORMAL LOW (ref 3.87–5.11)
RDW: 15.6 % — ABNORMAL HIGH (ref 11.5–15.5)
WBC: 6.2 10*3/uL (ref 4.0–10.5)
nRBC: 0 % (ref 0.0–0.2)

## 2021-12-18 LAB — BASIC METABOLIC PANEL
Anion gap: 10 (ref 5–15)
BUN: 23 mg/dL — ABNORMAL HIGH (ref 6–20)
CO2: 24 mmol/L (ref 22–32)
Calcium: 8.7 mg/dL — ABNORMAL LOW (ref 8.9–10.3)
Chloride: 100 mmol/L (ref 98–111)
Creatinine, Ser: 6.37 mg/dL — ABNORMAL HIGH (ref 0.44–1.00)
GFR, Estimated: 8 mL/min — ABNORMAL LOW (ref >60)
Glucose, Bld: 116 mg/dL — ABNORMAL HIGH (ref 70–99)
Potassium: 3.9 mmol/L (ref 3.5–5.1)
Sodium: 134 mmol/L — ABNORMAL LOW (ref 135–145)

## 2021-12-18 LAB — BRAIN NATRIURETIC PEPTIDE: B Natriuretic Peptide: >4500 pg/mL — ABNORMAL HIGH (ref 0.0–100.0)

## 2021-12-18 LAB — TROPONIN I (HIGH SENSITIVITY)
Troponin I (High Sensitivity): 29 ng/L — ABNORMAL HIGH (ref <18)
Troponin I (High Sensitivity): 30 ng/L — ABNORMAL HIGH (ref <18)

## 2021-12-18 NOTE — ED Notes (Signed)
No answer in lobby when called for vital signs.  Pt left according to registration

## 2021-12-18 NOTE — ED Notes (Signed)
Pts mother states that pt is "drowning in her own fluids and that this is an emergency room and that we need to see her daughter emergently". Explained that pt does not appear in any acute distress as viewed by this RN. Pts O2 sats were checked at desk with 99 on RA. Pts mother then states that "this place is ridiculous and that she will be calling [facility] to see what their wait is". Explained that the staff are doing best to provide care to all and that we will get to her for triage as soon as possible.

## 2021-12-18 NOTE — ED Provider Triage Note (Signed)
  Emergency Medicine Provider Triage Evaluation Note  Nichole Wiley , a 41 y.o.female,  was evaluated in triage.  Pt complains of shortness of breath.  Patient states that her shortness of breath started today.  She typically undergoes hemodialysis on [facility].  She was sent here by her doctor to have dialysis here today.   Review of Systems  Positive: Shortness of breath Negative: Denies fever, chest pain, vomiting  Physical Exam   Vitals:   12/18/21 1556  BP: (!) 151/82  Pulse: 84  Resp: 20  Temp: 97.6 F (36.4 C)  SpO2: 97%   Gen:   Awake, no distress   Resp:  Normal effort  MSK:   Moves extremities without difficulty  Other:    Medical Decision Making  Given the patient's initial medical screening exam, the following diagnostic evaluation has been ordered. The patient will be placed in the appropriate treatment space, once one is available, to complete the evaluation and treatment. I have discussed the plan of care with the patient and I have advised the patient that an ED physician or mid-level practitioner will reevaluate their condition after the test results have been received, as the results may give them additional insight into the type of treatment they may need.    Diagnostics: Labs, EKG, CXR  Treatments: none immediately   Teodoro Spray, [place] 12/18/21 1622

## 2021-12-18 NOTE — ED Triage Notes (Signed)
Pt arrives with SOB that has started today. Pt is a HD and goes [facility]. Per pt, her MD sent her here to have fluid pulled off.

## 2021-12-20 LAB — MISC LABCORP TEST (SEND OUT)
LabCorp test name: 5367
Labcorp test code: 9985

## 2021-12-24 ENCOUNTER — Other Ambulatory Visit: Payer: Self-pay

## 2021-12-24 ENCOUNTER — Emergency Department: Payer: Medicaid Other

## 2021-12-24 ENCOUNTER — Inpatient Hospital Stay
Admission: EM | Admit: 2021-12-24 | Discharge: 2022-01-08 | DRG: 296 | Disposition: E | Payer: Medicaid Other | Attending: Internal Medicine | Admitting: Internal Medicine

## 2021-12-24 ENCOUNTER — Inpatient Hospital Stay (HOSPITAL_COMMUNITY)
Admit: 2021-12-24 | Discharge: 2021-12-24 | Disposition: A | Discharge status: Home / Self Care | Payer: Medicaid Other | Attending: Internal Medicine | Admitting: Internal Medicine

## 2021-12-24 DIAGNOSIS — Z794 Long term (current) use of insulin: Secondary | ICD-10-CM | POA: Diagnosis not present

## 2021-12-24 DIAGNOSIS — G9341 Metabolic encephalopathy: Secondary | ICD-10-CM | POA: Diagnosis present

## 2021-12-24 DIAGNOSIS — Z8674 Personal history of sudden cardiac arrest: Secondary | ICD-10-CM

## 2021-12-24 DIAGNOSIS — Z79899 Other long term (current) drug therapy: Secondary | ICD-10-CM

## 2021-12-24 DIAGNOSIS — Z7189 Other specified counseling: Secondary | ICD-10-CM | POA: Diagnosis not present

## 2021-12-24 DIAGNOSIS — R9431 Abnormal electrocardiogram [ECG] [EKG]: Secondary | ICD-10-CM

## 2021-12-24 DIAGNOSIS — E1022 Type 1 diabetes mellitus with diabetic chronic kidney disease: Secondary | ICD-10-CM | POA: Diagnosis present

## 2021-12-24 DIAGNOSIS — Z1152 Encounter for screening for COVID-19: Secondary | ICD-10-CM

## 2021-12-24 DIAGNOSIS — I3139 Other pericardial effusion (noninflammatory): Secondary | ICD-10-CM | POA: Diagnosis present

## 2021-12-24 DIAGNOSIS — R569 Unspecified convulsions: Secondary | ICD-10-CM | POA: Diagnosis not present

## 2021-12-24 DIAGNOSIS — Z7982 Long term (current) use of aspirin: Secondary | ICD-10-CM

## 2021-12-24 DIAGNOSIS — K219 Gastro-esophageal reflux disease without esophagitis: Secondary | ICD-10-CM | POA: Diagnosis present

## 2021-12-24 DIAGNOSIS — N186 End stage renal disease: Secondary | ICD-10-CM | POA: Diagnosis present

## 2021-12-24 DIAGNOSIS — G40901 Epilepsy, unspecified, not intractable, with status epilepticus: Secondary | ICD-10-CM | POA: Diagnosis present

## 2021-12-24 DIAGNOSIS — Z515 Encounter for palliative care: Secondary | ICD-10-CM | POA: Diagnosis not present

## 2021-12-24 DIAGNOSIS — I132 Hypertensive heart and chronic kidney disease with heart failure and with stage 5 chronic kidney disease, or end stage renal disease: Secondary | ICD-10-CM | POA: Diagnosis present

## 2021-12-24 DIAGNOSIS — D631 Anemia in chronic kidney disease: Secondary | ICD-10-CM | POA: Diagnosis present

## 2021-12-24 DIAGNOSIS — Z880 Allergy status to penicillin: Secondary | ICD-10-CM

## 2021-12-24 DIAGNOSIS — I2510 Atherosclerotic heart disease of native coronary artery without angina pectoris: Secondary | ICD-10-CM | POA: Diagnosis present

## 2021-12-24 DIAGNOSIS — Z66 Do not resuscitate: Secondary | ICD-10-CM | POA: Diagnosis not present

## 2021-12-24 DIAGNOSIS — J9601 Acute respiratory failure with hypoxia: Secondary | ICD-10-CM | POA: Diagnosis present

## 2021-12-24 DIAGNOSIS — I255 Ischemic cardiomyopathy: Secondary | ICD-10-CM | POA: Diagnosis present

## 2021-12-24 DIAGNOSIS — M1990 Unspecified osteoarthritis, unspecified site: Secondary | ICD-10-CM | POA: Diagnosis present

## 2021-12-24 DIAGNOSIS — I25118 Atherosclerotic heart disease of native coronary artery with other forms of angina pectoris: Secondary | ICD-10-CM | POA: Diagnosis not present

## 2021-12-24 DIAGNOSIS — Z881 Allergy status to other antibiotic agents status: Secondary | ICD-10-CM

## 2021-12-24 DIAGNOSIS — J9602 Acute respiratory failure with hypercapnia: Secondary | ICD-10-CM | POA: Diagnosis present

## 2021-12-24 DIAGNOSIS — G931 Anoxic brain damage, not elsewhere classified: Secondary | ICD-10-CM | POA: Diagnosis present

## 2021-12-24 DIAGNOSIS — Z833 Family history of diabetes mellitus: Secondary | ICD-10-CM

## 2021-12-24 DIAGNOSIS — I469 Cardiac arrest, cause unspecified: Principal | ICD-10-CM | POA: Diagnosis present

## 2021-12-24 DIAGNOSIS — F17210 Nicotine dependence, cigarettes, uncomplicated: Secondary | ICD-10-CM | POA: Diagnosis present

## 2021-12-24 DIAGNOSIS — I5022 Chronic systolic (congestive) heart failure: Secondary | ICD-10-CM | POA: Diagnosis not present

## 2021-12-24 DIAGNOSIS — F1120 Opioid dependence, uncomplicated: Secondary | ICD-10-CM | POA: Diagnosis present

## 2021-12-24 DIAGNOSIS — Z992 Dependence on renal dialysis: Secondary | ICD-10-CM

## 2021-12-24 DIAGNOSIS — Z8719 Personal history of other diseases of the digestive system: Secondary | ICD-10-CM

## 2021-12-24 DIAGNOSIS — I5023 Acute on chronic systolic (congestive) heart failure: Secondary | ICD-10-CM | POA: Diagnosis present

## 2021-12-24 DIAGNOSIS — R7989 Other specified abnormal findings of blood chemistry: Secondary | ICD-10-CM | POA: Diagnosis not present

## 2021-12-24 DIAGNOSIS — Z90721 Acquired absence of ovaries, unilateral: Secondary | ICD-10-CM

## 2021-12-24 DIAGNOSIS — Z8249 Family history of ischemic heart disease and other diseases of the circulatory system: Secondary | ICD-10-CM

## 2021-12-24 DIAGNOSIS — Z79890 Hormone replacement therapy (postmenopausal): Secondary | ICD-10-CM

## 2021-12-24 DIAGNOSIS — Z90710 Acquired absence of both cervix and uterus: Secondary | ICD-10-CM

## 2021-12-24 LAB — URINALYSIS, COMPLETE (UACMP) WITH MICROSCOPIC
Bacteria, UA: NONE SEEN
Bilirubin Urine: NEGATIVE
Glucose, UA: 150 mg/dL — AB
Hgb urine dipstick: NEGATIVE
Ketones, ur: NEGATIVE mg/dL
Leukocytes,Ua: NEGATIVE
Nitrite: NEGATIVE
Protein, ur: >=300 mg/dL — AB
Specific Gravity, Urine: 1.007 (ref 1.005–1.030)
pH: 9 — ABNORMAL HIGH (ref 5.0–8.0)

## 2021-12-24 LAB — URINE DRUG SCREEN, QUALITATIVE (ARMC ONLY)
Amphetamines, Ur Screen: NOT DETECTED
Barbiturates, Ur Screen: NOT DETECTED
Benzodiazepine, Ur Scrn: NOT DETECTED
Cannabinoid 50 Ng, Ur ~~LOC~~: NOT DETECTED
Cocaine Metabolite,Ur ~~LOC~~: NOT DETECTED
MDMA (Ecstasy)Ur Screen: NOT DETECTED
Methadone Scn, Ur: NOT DETECTED
Opiate, Ur Screen: NOT DETECTED
Phencyclidine (PCP) Ur S: NOT DETECTED
Tricyclic, Ur Screen: NOT DETECTED

## 2021-12-24 LAB — COMPREHENSIVE METABOLIC PANEL
ALT: 29 U/L (ref 0–44)
AST: 42 U/L — ABNORMAL HIGH (ref 15–41)
Albumin: 2.7 g/dL — ABNORMAL LOW (ref 3.5–5.0)
Alkaline Phosphatase: 174 U/L — ABNORMAL HIGH (ref 38–126)
Anion gap: 13 (ref 5–15)
BUN: 16 mg/dL (ref 6–20)
CO2: 26 mmol/L (ref 22–32)
Calcium: 8.5 mg/dL — ABNORMAL LOW (ref 8.9–10.3)
Chloride: 100 mmol/L (ref 98–111)
Creatinine, Ser: 4.99 mg/dL — ABNORMAL HIGH (ref 0.44–1.00)
GFR, Estimated: 10 mL/min — ABNORMAL LOW (ref >60)
Glucose, Bld: 162 mg/dL — ABNORMAL HIGH (ref 70–99)
Potassium: 4.1 mmol/L (ref 3.5–5.1)
Sodium: 139 mmol/L (ref 135–145)
Total Bilirubin: 1 mg/dL (ref 0.3–1.2)
Total Protein: 5.9 g/dL — ABNORMAL LOW (ref 6.5–8.1)

## 2021-12-24 LAB — LACTIC ACID, PLASMA
Lactic Acid, Venous: >9 mmol/L (ref 0.5–1.9)
Lactic Acid, Venous: 2.7 mmol/L (ref 0.5–1.9)

## 2021-12-24 LAB — RESP PANEL BY RT-PCR (FLU A&B, COVID) ARPGX2
Influenza A by PCR: NEGATIVE
Influenza B by PCR: NEGATIVE
SARS Coronavirus 2 by RT PCR: NEGATIVE

## 2021-12-24 LAB — PROCALCITONIN: Procalcitonin: 0.23 ng/mL

## 2021-12-24 LAB — BASIC METABOLIC PANEL
Anion gap: 15 (ref 5–15)
BUN: 13 mg/dL (ref 6–20)
CO2: 20 mmol/L — ABNORMAL LOW (ref 22–32)
Calcium: 7.7 mg/dL — ABNORMAL LOW (ref 8.9–10.3)
Chloride: 103 mmol/L (ref 98–111)
Creatinine, Ser: 4.82 mg/dL — ABNORMAL HIGH (ref 0.44–1.00)
GFR, Estimated: 11 mL/min — ABNORMAL LOW (ref >60)
Glucose, Bld: 233 mg/dL — ABNORMAL HIGH (ref 70–99)
Potassium: 3.6 mmol/L (ref 3.5–5.1)
Sodium: 138 mmol/L (ref 135–145)

## 2021-12-24 LAB — CBC WITH DIFFERENTIAL/PLATELET
Abs Immature Granulocytes: 0.02 10*3/uL (ref 0.00–0.07)
Basophils Absolute: 0.1 10*3/uL (ref 0.0–0.1)
Basophils Relative: 1 %
Eosinophils Absolute: 0.3 10*3/uL (ref 0.0–0.5)
Eosinophils Relative: 3 %
HCT: 37 % (ref 36.0–46.0)
Hemoglobin: 10.9 g/dL — ABNORMAL LOW (ref 12.0–15.0)
Immature Granulocytes: 0 %
Lymphocytes Relative: 27 %
Lymphs Abs: 2.2 10*3/uL (ref 0.7–4.0)
MCH: 28.2 pg (ref 26.0–34.0)
MCHC: 29.5 g/dL — ABNORMAL LOW (ref 30.0–36.0)
MCV: 95.9 fL (ref 80.0–100.0)
Monocytes Absolute: 0.4 10*3/uL (ref 0.1–1.0)
Monocytes Relative: 5 %
Neutro Abs: 5.2 10*3/uL (ref 1.7–7.7)
Neutrophils Relative %: 64 %
Platelets: 217 10*3/uL (ref 150–400)
RBC: 3.86 MIL/uL — ABNORMAL LOW (ref 3.87–5.11)
RDW: 16.4 % — ABNORMAL HIGH (ref 11.5–15.5)
WBC: 8.1 10*3/uL (ref 4.0–10.5)
nRBC: 0 % (ref 0.0–0.2)

## 2021-12-24 LAB — BLOOD GAS, ARTERIAL
Acid-Base Excess: 0.2 mmol/L (ref 0.0–2.0)
Bicarbonate: 24.6 mmol/L (ref 20.0–28.0)
FIO2: 30 %
MECHVT: 490 mL
Mechanical Rate: 18
O2 Saturation: 98.7 %
PEEP: 5 cmH2O
Patient temperature: 37
pCO2 arterial: 38 mmHg (ref 32–48)
pH, Arterial: 7.42 (ref 7.35–7.45)
pO2, Arterial: 82 mmHg — ABNORMAL LOW (ref 83–108)

## 2021-12-24 LAB — TROPONIN I (HIGH SENSITIVITY)
Troponin I (High Sensitivity): 63 ng/L — ABNORMAL HIGH (ref <18)
Troponin I (High Sensitivity): 211 ng/L (ref <18)

## 2021-12-24 LAB — GLUCOSE, CAPILLARY: Glucose-Capillary: 107 mg/dL — ABNORMAL HIGH (ref 70–99)

## 2021-12-24 LAB — MRSA NEXT GEN BY PCR, NASAL: MRSA by PCR Next Gen: NOT DETECTED

## 2021-12-24 LAB — PROTIME-INR
INR: 1.2 (ref 0.8–1.2)
Prothrombin Time: 15.3 seconds — ABNORMAL HIGH (ref 11.4–15.2)

## 2021-12-24 LAB — APTT: aPTT: 31 seconds (ref 24–36)

## 2021-12-24 LAB — MAGNESIUM: Magnesium: 1.8 mg/dL (ref 1.7–2.4)

## 2021-12-24 MED ORDER — DOCUSATE SODIUM 100 MG PO CAPS: 100.0000 mg | ORAL_CAPSULE | Freq: Two times a day (BID) | ORAL | Status: DC | PRN

## 2021-12-24 MED ORDER — MIDAZOLAM HCL 2 MG/2ML IJ SOLN
INTRAMUSCULAR | Status: AC
  Administered 2021-12-24: 2 mg via INTRAVENOUS
  Filled 2021-12-24: qty 2

## 2021-12-24 MED ORDER — ETOMIDATE 2 MG/ML IV SOLN
INTRAVENOUS | Status: AC | PRN
  Administered 2021-12-24: 20 mg via INTRAVENOUS

## 2021-12-24 MED ORDER — MIDAZOLAM HCL 2 MG/2ML IJ SOLN: 2.0000 mg | INTRAMUSCULAR | Status: DC | PRN

## 2021-12-24 MED ORDER — VANCOMYCIN HCL 1750 MG/350ML IV SOLN
1750.0000 mg | Freq: Once | INTRAVENOUS | Status: AC
  Administered 2021-12-24: 1750 mg via INTRAVENOUS
  Filled 2021-12-24: qty 350

## 2021-12-24 MED ORDER — DOCUSATE SODIUM 50 MG/5ML PO LIQD: 100.0000 mg | Freq: Two times a day (BID) | ORAL | Status: DC | PRN

## 2021-12-24 MED ORDER — PROPOFOL 1000 MG/100ML IV EMUL
5.0000 ug/kg/min | INTRAVENOUS | Status: DC
  Administered 2021-12-24: 80 ug/kg/min via INTRAVENOUS
  Administered 2021-12-25: 50 ug/kg/min via INTRAVENOUS
  Administered 2021-12-25 (×3): 80 ug/kg/min via INTRAVENOUS
  Administered 2021-12-25: 70 ug/kg/min via INTRAVENOUS
  Filled 2021-12-24 (×8): qty 100

## 2021-12-24 MED ORDER — POLYETHYLENE GLYCOL 3350 17 G PO PACK
17.0000 g | PACK | Freq: Every day | ORAL | Status: DC
  Administered 2021-12-25: 17 g
  Filled 2021-12-24: qty 1

## 2021-12-24 MED ORDER — CALCIUM CHLORIDE 10 % IV SOLN
INTRAVENOUS | Status: AC | PRN
  Administered 2021-12-24: 1 g via INTRAVENOUS

## 2021-12-24 MED ORDER — LEVETIRACETAM IN NACL 500 MG/100ML IV SOLN
500.0000 mg | Freq: Once | INTRAVENOUS | Status: AC
  Administered 2021-12-24: 500 mg via INTRAVENOUS
  Filled 2021-12-24: qty 100

## 2021-12-24 MED ORDER — MIDAZOLAM-SODIUM CHLORIDE 100-0.9 MG/100ML-% IV SOLN
15.0000 mg/h | INTRAVENOUS | Status: DC
  Administered 2021-12-24: 2 mg/h via INTRAVENOUS
  Administered 2021-12-25 (×2): 15 mg/h via INTRAVENOUS
  Filled 2021-12-24 (×4): qty 100

## 2021-12-24 MED ORDER — DOCUSATE SODIUM 50 MG/5ML PO LIQD
100.0000 mg | Freq: Two times a day (BID) | ORAL | Status: DC
  Administered 2021-12-25: 100 mg
  Filled 2021-12-24: qty 10

## 2021-12-24 MED ORDER — HEPARIN SODIUM (PORCINE) 5000 UNIT/ML IJ SOLN
5000.0000 [IU] | Freq: Three times a day (TID) | INTRAMUSCULAR | Status: DC
  Administered 2021-12-24 – 2021-12-25 (×3): 5000 [IU] via SUBCUTANEOUS
  Filled 2021-12-24 (×3): qty 1

## 2021-12-24 MED ORDER — SODIUM CHLORIDE 0.9 % IV SOLN
2.0000 g | Freq: Once | INTRAVENOUS | Status: AC
  Administered 2021-12-24: 2 g via INTRAVENOUS
  Filled 2021-12-24: qty 12.5

## 2021-12-24 MED ORDER — PROPOFOL 1000 MG/100ML IV EMUL
INTRAVENOUS | Status: AC
  Administered 2021-12-24: 5 ug/kg/min via INTRAVENOUS
  Filled 2021-12-24: qty 100

## 2021-12-24 MED ORDER — ACETAMINOPHEN 325 MG PO TABS: 650.0000 mg | ORAL_TABLET | ORAL | Status: DC | PRN

## 2021-12-24 MED ORDER — SODIUM CHLORIDE 0.9% FLUSH
3.0000 mL | Freq: Two times a day (BID) | INTRAVENOUS | Status: DC
  Administered 2021-12-24 – 2021-12-25 (×2): 3 mL via INTRAVENOUS

## 2021-12-24 MED ORDER — ROCURONIUM BROMIDE 50 MG/5ML IV SOLN
INTRAVENOUS | Status: AC | PRN
  Administered 2021-12-24: 100 mg via INTRAVENOUS

## 2021-12-24 MED ORDER — PERFLUTREN LIPID MICROSPHERE
1.0000 mL | INTRAVENOUS | Status: AC | PRN
  Administered 2021-12-24: 2 mL via INTRAVENOUS

## 2021-12-24 MED ORDER — FENTANYL CITRATE PF 50 MCG/ML IJ SOSY: 50.0000 ug | PREFILLED_SYRINGE | INTRAMUSCULAR | Status: DC | PRN

## 2021-12-24 MED ORDER — SODIUM CHLORIDE 0.9 % IV SOLN: 250.0000 mL | INTRAVENOUS | Status: DC | PRN

## 2021-12-24 MED ORDER — SODIUM CHLORIDE 0.9 % IV SOLN
250.0000 mg | Freq: Two times a day (BID) | INTRAVENOUS | Status: DC
  Administered 2021-12-25: 250 mg via INTRAVENOUS
  Filled 2021-12-24 (×2): qty 2.5

## 2021-12-24 MED ORDER — FENTANYL 2500MCG IN NS 250ML (10MCG/ML) PREMIX INFUSION
25.0000 ug/h | INTRAVENOUS | Status: DC
  Administered 2021-12-24: 100 ug/h via INTRAVENOUS
  Filled 2021-12-24: qty 250

## 2021-12-24 MED ORDER — MAGNESIUM SULFATE IN D5W 1-5 GM/100ML-% IV SOLN
1.0000 g | Freq: Once | INTRAVENOUS | Status: AC
  Administered 2021-12-24: 1 g via INTRAVENOUS
  Filled 2021-12-24: qty 100

## 2021-12-24 MED ORDER — IOHEXOL 350 MG/ML SOLN
75.0000 mL | Freq: Once | INTRAVENOUS | Status: AC | PRN
  Administered 2021-12-24: 75 mL via INTRAVENOUS

## 2021-12-24 MED ORDER — VALPROATE SODIUM 100 MG/ML IV SOLN
1500.0000 mg | Freq: Once | INTRAVENOUS | Status: AC
  Administered 2021-12-24: 1500 mg via INTRAVENOUS
  Filled 2021-12-24: qty 15

## 2021-12-24 MED ORDER — CHLORHEXIDINE GLUCONATE CLOTH 2 % EX PADS
6.0000 | MEDICATED_PAD | Freq: Every day | CUTANEOUS | Status: DC
  Administered 2021-12-24 – 2021-12-25 (×2): 6 via TOPICAL

## 2021-12-24 MED ORDER — MIDAZOLAM BOLUS VIA INFUSION: 2.0000 mg | INTRAVENOUS | Status: DC | PRN

## 2021-12-24 MED ORDER — LEVETIRACETAM IN NACL 1000 MG/100ML IV SOLN
1000.0000 mg | Freq: Once | INTRAVENOUS | Status: AC
  Administered 2021-12-24: 1000 mg via INTRAVENOUS
  Filled 2021-12-24: qty 100

## 2021-12-24 MED ORDER — MIDAZOLAM HCL 2 MG/2ML IJ SOLN
2.0000 mg | INTRAMUSCULAR | Status: DC | PRN
  Filled 2021-12-24: qty 2

## 2021-12-24 MED ORDER — POLYETHYLENE GLYCOL 3350 17 G PO PACK: 17.0000 g | PACK | Freq: Every day | ORAL | Status: DC | PRN

## 2021-12-24 MED ORDER — MIDAZOLAM HCL 2 MG/2ML IJ SOLN: 2.0000 mg | Freq: Once | INTRAMUSCULAR | Status: AC

## 2021-12-24 MED ORDER — ORAL CARE MOUTH RINSE
15.0000 mL | OROMUCOSAL | Status: DC
  Administered 2021-12-25 (×8): 15 mL via OROMUCOSAL

## 2021-12-24 MED ORDER — SODIUM CHLORIDE 0.9% FLUSH: 3.0000 mL | INTRAVENOUS | Status: DC | PRN

## 2021-12-24 MED ORDER — MIDAZOLAM HCL 2 MG/2ML IJ SOLN
2.0000 mg | Freq: Once | INTRAMUSCULAR | Status: AC
  Administered 2021-12-24: 2 mg via INTRAVENOUS

## 2021-12-24 MED ORDER — PANTOPRAZOLE 2 MG/ML SUSPENSION: 40.0000 mg | Freq: Every day | ORAL | Status: DC

## 2021-12-24 MED ORDER — ORAL CARE MOUTH RINSE: 15.0000 mL | OROMUCOSAL | Status: DC | PRN

## 2021-12-24 MED ORDER — PANTOPRAZOLE SODIUM 40 MG IV SOLR
40.0000 mg | Freq: Every day | INTRAVENOUS | Status: DC
  Administered 2021-12-24: 40 mg via INTRAVENOUS
  Filled 2021-12-24: qty 10

## 2021-12-24 NOTE — Progress Notes (Signed)
Patient intubated at 1535 by ED MD with 7.5 ETT, 25 at lip. Patient placed on transport vent, 400/18/30%/5+. Awaiting CT.

## 2021-12-24 NOTE — ED Notes (Signed)
ICU provider @ the bedside.

## 2021-12-24 NOTE — ED Notes (Addendum)
[place] @ the bedside for Echo Exam. Family provided an update.

## 2021-12-24 NOTE — ED Notes (Signed)
Pt had a large soft BM - pt cleaned with assistance from EDT Jonelle Sidle).

## 2021-12-24 NOTE — ED Notes (Signed)
ICU provider at the bedside- pt experiencing "jumping/bouncing" of her whole body per family. Observed by this RN & provider - see MAR for interventions.

## 2021-12-24 NOTE — ED Notes (Signed)
Dr. Mortimer Fries at bedside. Verbal orders to stop propofol at this time.

## 2021-12-24 NOTE — ED Notes (Signed)
This RN walked patient back to room at this time.

## 2021-12-24 NOTE — ED Notes (Signed)
Pt transported to CT with this RN, Presenter, broadcasting, and RT

## 2021-12-24 NOTE — Consult Note (Signed)
PHARMACY -  BRIEF ANTIBIOTIC NOTE   Pharmacy has received consult(s) for vancomycin from an ED provider.  The patient's profile has been reviewed for ht/wt/allergies/indication/available labs.    One time order(s) placed for vancomycin 1,750 mg IV x 1  Further antibiotics/pharmacy consults should be ordered by admitting physician if indicated.                       Thank you, Dara Hoyer, PharmD PGY-1 Pharmacy Resident 12/13/2021 4:31 PM

## 2021-12-24 NOTE — ED Notes (Signed)
Pt starting to bite tube and move foot. Family stating "she's kicking me." Propofol restarted

## 2021-12-24 NOTE — Consult Note (Signed)
PHARMACY CONSULT NOTE - FOLLOW UP  Pharmacy Consult for Electrolyte Monitoring and Replacement   Recent Labs: Potassium (mmol/L)  Date Value  12/27/2021 3.6  01/20/2012 3.5   Magnesium (mg/dL)  Date Value  12/11/2021 1.9   Calcium (mg/dL)  Date Value  01/05/2022 7.7 (L)   Calcium, Total (mg/dL)  Date Value  01/20/2012 8.7   Albumin (g/dL)  Date Value  11/29/2021 2.5 (L)  01/20/2012 2.9 (L)   Phosphorus (mg/dL)  Date Value  11/29/2021 3.1   Sodium (mmol/L)  Date Value  12/09/2021 138  01/20/2012 127 (L)   Assessment: 41 yo F with PMH ESRD-HD, ESRD-HD, CAD, ICM, HFmrEF (EF 40-45%), T1DM, chronic pain, h/o opioid addiction presents with acute hypoxic and hypercapnic respiratory failure and cardiac arrest. Pt was at HD earlier today when suddenly became SOB and hypoxic, and EMS found her to be severely hypoxic with O2Sats in the 70s. Pt then had cardiopulmonary arrest that required 5 rounds of epinephrine, 1 dose of sodium bicarb, and 20 minutes of CPR before ROSC was achieved.  Pt is currently intubated and unresponsive. Pharmacy has been consulted to manage electrolytes  Goal of Therapy:  WNL  Plan:  K 3.6 >> WNL, no replacement at this time Mg >> lab ordered Ca 8.6 (corrected for recent albumin 2.9) >> WNL, no replacement at this time Phos >> lab ordered  Dara Hoyer, PharmD PGY-1 Pharmacy Resident 12/15/2021 5:20 PM

## 2021-12-24 NOTE — ED Provider Notes (Signed)
[facility] Provider Note    Event Date/Time   First MD Initiated Contact with Patient 01/05/2022 1531     (approximate)   History   Cardiac Arrest   HPI  Nichole Wiley is a 41 y.o. female   Past medical history of end-stage renal disease on dialysis, CHF, substance use who presents to the emergency department in cardiac arrest, return of spontaneous circulation achieved by EMS in route.  She was at dialysis and went through most of her session when she became acutely short of breath and EMS was transporting her when she lost pulses.  They report approximately 5 rounds of ACLS with 2 epinephrine and 1 bicarb, she was initially in PEA but then got spontaneous return of circulation and a STEMI alert based on EKG.  Arrival to the emergency department she was taking some respirations on her own, agonal, unresponsive, and was intubated, see procedure note.  She was normotensive and tachycardic to the low 100s.  After intubation she was sedated with propofol.  Cardiology was at bedside and decided not to go to the catheterization lab due to her EKG reviewed, and recent catheterization performed last month when she presented with cardiac arrest as well.  I gave her empiric antibiotics for coverage sepsis, also calcium in case of hyperkalemia, and ordered a CAT scan of her head, CTA chest for PE, abdomen pelvis.  Admitted to ICU.   History was obtained via EMS and a review of external medical notes.      Physical Exam   Triage Vital Signs: ED Triage Vitals  Enc Vitals Group     BP 12/20/2021 1532 131/89     Pulse Rate 12/23/2021 1530 (!) 105     Resp 01/04/2022 1530 15     Temp 12/30/2021 1547 (!) 97.3 F (36.3 C)     Temp Source 12/13/2021 1547 Bladder     SpO2 01/04/2022 1532 100 %     Weight 01/03/2022 1548 178 lb 2.1 oz (80.8 kg)     Height 12/20/2021 1548 '5\' 7"'$  (1.702 m)     Head Circumference --      Peak Flow --      Pain Score --      Pain Loc --      Pain  Edu? --      Excl. in [place]? --     Most recent vital signs: Vitals:   12/13/2021 1815 12/31/2021 1830  BP: (!) 155/90 (!) 156/91  Pulse: 81 80  Resp: 20 19  Temp:    SpO2: 99% 99%    General: Unresponsive, agonal respirations. CV:  Tachycardic Resp:  Intubated Abd:  No distention.   ED Results / Procedures / Treatments   Labs (all labs ordered are listed, but only abnormal results are displayed) Labs Reviewed  BASIC METABOLIC PANEL - Abnormal; Notable for the following components:      Result Value   CO2 20 (*)    Glucose, Bld 233 (*)    Creatinine, Ser 4.82 (*)    Calcium 7.7 (*)    GFR, Estimated 11 (*)    All other components within normal limits  CBC WITH DIFFERENTIAL/PLATELET - Abnormal; Notable for the following components:   RBC 3.86 (*)    Hemoglobin 10.9 (*)    MCHC 29.5 (*)    RDW 16.4 (*)    All other components within normal limits  PROTIME-INR - Abnormal; Notable for the following components:   Prothrombin Time  15.3 (*)    All other components within normal limits  LACTIC ACID, PLASMA - Abnormal; Notable for the following components:   Lactic Acid, Venous >9.0 (*)    All other components within normal limits  LACTIC ACID, PLASMA - Abnormal; Notable for the following components:   Lactic Acid, Venous 2.7 (*)    All other components within normal limits  URINALYSIS, COMPLETE (UACMP) WITH MICROSCOPIC - Abnormal; Notable for the following components:   Color, Urine STRAW (*)    APPearance CLEAR (*)    pH 9.0 (*)    Glucose, UA 150 (*)    Protein, ur >=300 (*)    All other components within normal limits  TROPONIN I (HIGH SENSITIVITY) - Abnormal; Notable for the following components:   Troponin I (High Sensitivity) 63 (*)    All other components within normal limits  RESP PANEL BY RT-PCR (FLU A&B, COVID) ARPGX2  CULTURE, BLOOD (ROUTINE X 2)  CULTURE, BLOOD (ROUTINE X 2)  URINE CULTURE  APTT  COMPREHENSIVE METABOLIC PANEL  MAGNESIUM  URINE DRUG  SCREEN, QUALITATIVE ([facility] ONLY)  CBC  RENAL FUNCTION PANEL  HEPATIC FUNCTION PANEL  PROCALCITONIN  BASIC METABOLIC PANEL  MAGNESIUM  PHOSPHORUS  TROPONIN I (HIGH SENSITIVITY)     I reviewed labs and they are notable for glucose of 233 troponin 63  EKG  ED ECG REPORT I, Lucillie Garfinkel, the attending physician, personally viewed and interpreted this ECG.   Date: 12/28/2021  EKG Time: 1530  Rate: 106  Rhythm: sinus tachycardia  Axis: normal  Intervals:TWI v3-5 STE ii, iii    RADIOLOGY I independently reviewed and interpreted chest x-ray and see bilateral opacities concerning for pulmonary edema   PROCEDURES:  Critical Care performed: Yes, see critical care procedure note(s)  .Critical Care  Performed by: Lucillie Garfinkel, MD Authorized by: Lucillie Garfinkel, MD   Critical care provider statement:    Critical care time (minutes):  30   Critical care was necessary to treat or prevent imminent or life-threatening deterioration of the following conditions:  Circulatory failure   Critical care was time spent personally by me on the following activities:  Development of treatment plan with patient or surrogate, discussions with consultants, evaluation of patient's response to treatment, examination of patient, ordering and review of laboratory studies, ordering and review of radiographic studies, ordering and performing treatments and interventions, pulse oximetry, re-evaluation of patient's condition and review of old charts   Care discussed with: admitting provider   Procedure Name: Intubation Date/Time: 12/18/2021 6:53 PM  Performed by: Lucillie Garfinkel, MDPre-anesthesia Checklist: Patient identified, Emergency Drugs available, Suction available, Timeout performed and Patient being monitored Oxygen Delivery Method: Ambu bag Preoxygenation: Pre-oxygenation with 100% oxygen Induction Type: Rapid sequence Ventilation: Oral airway inserted - appropriate to patient size Laryngoscope Size:  Glidescope and 3 Tube size: 7.5 mm Number of attempts: 1 Airway Equipment and Method: Video-laryngoscopy Placement Confirmation: ETT inserted through vocal cords under direct vision, Breath sounds checked- equal and bilateral, Positive ETCO2 and CO2 detector Secured at: 25 cm Tube secured with: ETT holder Future Recommendations: Recommend- induction with short-acting agent, and alternative techniques readily available       MEDICATIONS ORDERED IN ED: Medications  propofol (DIPRIVAN) 1000 MG/100ML infusion (20 mcg/kg/min  79.8 kg Intravenous Rate/Dose Change 01/02/2022 1757)  sodium chloride flush (NS) 0.9 % injection 3 mL (has no administration in time range)  sodium chloride flush (NS) 0.9 % injection 3 mL (has no administration in time range)  0.9 %  sodium chloride infusion (has no administration in time range)  acetaminophen (TYLENOL) tablet 650 mg (has no administration in time range)  polyethylene glycol (MIRALAX / GLYCOLAX) packet 17 g (has no administration in time range)  heparin injection 5,000 Units (has no administration in time range)  pantoprazole (PROTONIX) injection 40 mg (has no administration in time range)  docusate (COLACE) 50 MG/5ML liquid 100 mg (has no administration in time range)  etomidate (AMIDATE) injection (20 mg Intravenous Given 12/19/2021 1531)  rocuronium (ZEMURON) injection (100 mg Intravenous Given 12/11/2021 1531)  calcium chloride injection (1 g Intravenous Given 12/23/2021 1537)  ceFEPIme (MAXIPIME) 2 g in sodium chloride 0.9 % 100 mL IVPB (0 g Intravenous Stopped 12/23/2021 1749)  iohexol (OMNIPAQUE) 350 MG/ML injection 75 mL (75 mLs Intravenous Contrast Given 12/30/2021 1624)  vancomycin (VANCOREADY) IVPB 1750 mg/350 mL (0 mg Intravenous Stopped 12/16/2021 1849)    Consultants:  I spoke with ICU Dr.Kasa Regarding care plan for this patient.   IMPRESSION / MDM / ASSESSMENT AND PLAN / ED COURSE  I reviewed the triage vital signs and the nursing notes.                               Differential diagnosis includes, but is not limited to, cardiac arrest due to respiratory causes, PE, infection, hyperkalemia   The patient is on the cardiac monitor to evaluate for evidence of arrhythmia and/or significant heart rate changes.  MDM: Differential as above, ROSC achieved in the EMS, work-up pending, admitted to ICU   Patient's presentation is most consistent with acute presentation with potential threat to life or bodily function.       FINAL CLINICAL IMPRESSION(S) / ED DIAGNOSES   Final diagnoses:  None     Rx / DC Orders   ED Discharge Orders     None        Note:  This document was prepared using Dragon voice recognition software and may include unintentional dictation errors.    Lucillie Garfinkel, MD 12/15/2021 [phone]

## 2021-12-24 NOTE — H&P (Signed)
NAME:  Nichole Wiley, MRN:  [id number], DOB:  09/14/1980, LOS: 0 ADMISSION DATE:  12/09/2021 CONSULTATION DATE:  12/23/2021   CHIEF COMPLAINT:  acute cardiac arrest    History of Present Illness:   41 y.o. female with a hx of  diabetes mellitus complicated by end-stage renal disease previously on peritoneal dialysis complicated by PD catheter failure and peritonitis requiring transition to hemodialysis,   severe multivessel coronary artery disease, ischemic cardiomyopathy with recent acute heart failure with mid range ejection fraction, type 1 diabetes mellitus, chronic pain, and prior opioid addiction on chronic methadone complicated by QT prolongation, who is being seen 01/04/2022 for the evaluation of cardiac arrest PCCM ASKED TO ADMIT   Patient is currently intubated and unresponsive. At hemodialysis earlier today when she suddenly became short of breath and hypoxic about 3/4 of the way through her dialysis treatment.    EMS was summoned and found her to be severely hypoxic with oxygen saturations in the 70s.    She suddenly had cardiopulmonary arrest that required 5 rounds of epinephrine, 1 dose of sodium bicarbonate, and approximately 20 minutes of CPR before ROSC was achieved.  CODE STEMI CALLED   PREVIOUS HOSPITALIZATION Last month suffered cardiac arrest in the setting of significant electrolyte derangements from a malfunctioning peritoneal dialysis catheter and acute peritonitis.  +polymorphic VT.  Echo showed moderately reduced LVEF.   right and left heart catheterization that showed moderate to severe three-vessel disease not well suited to PCI as well as moderately-severely elevated left and right heart filling pressures.    Patient now intubated, weaning of sedation Showing signs of severe brain damage based on physical examination Absent COUGH/GAG/CORNEAL REFLEXES and [place] [facility] Events: Including procedures, antibiotic start and  stop dates in addition to other pertinent events   10/17 admitted for acute cardiac arrest ?etiology signs of brain damage     Micro Data:  COVID PENDING  Antimicrobials:   Antibiotics Given (last 72 hours)     Date/Time Action Medication Dose Rate   12/27/2021 1643 New Bag/Given   ceFEPIme (MAXIPIME) 2 g in sodium chloride 0.9 % 100 mL IVPB 2 g 200 mL/hr   12/20/2021 1648 New Bag/Given   vancomycin (VANCOREADY) IVPB 1750 mg/350 mL 1,750 mg 175 mL/hr            Objective   Blood pressure (!) 159/103, pulse (!) 106, temperature (!) 97.3 F (36.3 C), temperature source Bladder, resp. rate 19, height '5\' 7"'$  (1.702 m), weight 80.8 kg, SpO2 94 %.    FiO2 (%):  [30 %] 30 % Set Rate:  [18 bmp] 18 bmp Vt Set:  [400 mL] 400 mL PEEP:  [5 cmH20] 5 cmH20  No intake or output data in the 24 hours ending 01/06/2022 1655 Filed Weights   01/03/2022 1548  Weight: 80.8 kg     REVIEW OF SYSTEMS  PATIENT IS UNABLE TO PROVIDE COMPLETE REVIEW OF SYSTEMS DUE TO SEVERE CRITICAL ILLNESS   PHYSICAL EXAMINATION:  GENERAL:critically ill appearing, +resp distress EYES: Pupils equal, round, reactive to light.  No scleral icterus.  MOUTH: Moist mucosal membrane. INTUBATED NECK: Supple.  PULMONARY: Lungs clear to auscultation, +rhonchi, +wheezing CARDIOVASCULAR: S1 and S2.  Regular rate and rhythm GASTROINTESTINAL: Soft, nontender, -distended. Positive bowel sounds.  MUSCULOSKELETAL: No swelling, clubbing, or edema.  NEUROLOGIC: obtunded,sedated SKIN:normal, warm to touch, Capillary refill delayed  Pulses present bilaterally   Labs/imaging that I havepersonally reviewed  (right click and "Reselect all SmartList  Selections" daily)     ASSESSMENT AND PLAN SYNOPSIS  41 yo white female with multiple comorbid conditions DM and  with 3VD severe CAD with acute and severe hypoxic resp failure leading to acute and sudden cardiac arrest  with severe multiorgan failure, at this time findings are  concerning for brain damage    Severe ACUTE Hypoxic and Hypercapnic Respiratory Failure -continue Mechanical Ventilator support -continue Bronchodilator Therapy -Wean Fio2 and PEEP as tolerated -VAP/VENT bundle implementation Wean off sedation   FiO2 (%):  [30 %] 30 % Set Rate:  [18 bmp] 18 bmp Vt Set:  [400 mL] 400 mL PEEP:  [5 cmH20] 5 cmH20 All CT scans pending   CARDIAC FAILURE-acute combined systolic/diastolic dysfunction Follow up cardiology recs Last echo 11/2021  Left ventricular ejection fraction, by estimation, is 40 to 45%. The  left ventricle has mildly decreased function. The left ventricle  demonstrates global hypokinesis. Left ventricular diastolic parameters are indeterminate. Elevated left ventricular end-diastolic pressure.  -oxygen as needed -follow up cardiac enzymes as indicated Prognosis is very poor High risk for recurrent cardiac arrest  CARDIAC ICU monitoring   ESRD ON HD -continue Foley Catheter-assess need -Avoid nephrotoxic agents -Follow urine output, BMP -Ensure adequate renal perfusion, optimize oxygenation -Renal dose medications HOLD HD FOR NOW    NEUROLOGY Severe and Acute  metabolic encephalopathy Signs of brain damage Plan for TTM with normothermia protocol Await CT scans   INFECTIOUS DISEASE -continue antibiotics as prescribed -follow up cultures  ENDO - ICU hypoglycemic\Hyperglycemia protocol -check FSBS per protocol   GI GI PROPHYLAXIS as indicated  NUTRITIONAL STATUS DIET-->NPO Constipation protocol as indicated   ELECTROLYTES -follow labs as needed -replace as needed -pharmacy consultation and following   ACUTE ANEMIA- TRANSFUSE AS NEEDED CONSIDER TRANSFUSION  IF HGB<7     Best practice (right click and "Reselect all SmartList Selections" daily)  Diet: NPO Pain/Anxiety/Delirium protocol (if indicated): No VAP protocol (if indicated): Yes DVT prophylaxis: Subcutaneous Heparin GI prophylaxis:  PPI Mobility:  bed rest  Code Status:  FULL Disposition:ICU  Labs   CBC: Recent Labs  Lab 12/18/21 1612 12/15/2021 1533  WBC 6.2 8.1  NEUTROABS  --  5.2  HGB 10.7* 10.9*  HCT 34.8* 37.0  MCV 92.6 95.9  PLT 243 834    Basic Metabolic Panel: Recent Labs  Lab 12/18/21 1612 12/31/2021 1533  NA 134* 138  K 3.9 3.6  CL 100 103  CO2 24 20*  GLUCOSE 116* 233*  BUN 23* 13  CREATININE 6.37* 4.82*  CALCIUM 8.7* 7.7*   GFR: Estimated Creatinine Clearance: 16.5 mL/min (A) (by C-G formula based on SCr of 4.82 mg/dL (H)). Recent Labs  Lab 12/18/21 1612 12/14/2021 1533  WBC 6.2 8.1    Liver Function Tests: No results for input(s): "AST", "ALT", "ALKPHOS", "BILITOT", "PROT", "ALBUMIN" in the last 168 hours. No results for input(s): "LIPASE", "AMYLASE" in the last 168 hours. No results for input(s): "AMMONIA" in the last 168 hours.  ABG    Component Value Date/Time   PHART 7.46 (H) 11/25/2021 2323   PCO2ART 31 (L) 11/25/2021 2323   PO2ART 60 (L) 11/25/2021 2323   HCO3 22.0 11/25/2021 2323   ACIDBASEDEF 0.9 11/25/2021 2323   O2SAT 93.4 11/25/2021 2323     Coagulation Profile: Recent Labs  Lab 12/30/2021 1533  INR 1.2    Cardiac Enzymes: No results for input(s): "CKTOTAL", "CKMB", "CKMBINDEX", "TROPONINI" in the last 168 hours.  HbA1C: Hemoglobin A1C  Date/Time Value Ref Range Status  04/27/2021 12:00 AM 5  Final    Comment:    care everywhere   Hgb A1c MFr Bld  Date/Time Value Ref Range Status  10/01/2021 02:55 AM 4.8 4.8 - 5.6 % Final    Comment:    (NOTE) Pre diabetes:          5.7%-6.4%  Diabetes:              >6.4%  Glycemic control for   <7.0% adults with diabetes     CBG: No results for input(s): "GLUCAP" in the last 168 hours.   Past Medical History:  She,  has a past medical history of Abdominal mass, Arthritis, Chronic kidney disease, Colon polyp, Coronary artery disease (11/2021), Diabetes 1.5, managed as type 1 ([place]), Drug addiction ([place]),  GERD (gastroesophageal reflux disease), Heart failure with mildly reduced ejection fraction (HFmrEF) ([place]) (11/2021), Hypertension, Pelvic floor dysfunction, and Urine incontinence.   Surgical History:   Past Surgical History:  Procedure Laterality Date   ABDOMINAL HYSTERECTOMY  2005   CAPD REMOVAL N/A 11/22/2021   Procedure: CONTINUOUS AMBULATORY PERITONEAL DIALYSIS  (CAPD) CATHETER REMOVAL;  Surgeon: Benjamine Sprague, DO;  Location: [facility];  Service: General;  Laterality: N/A;   CESAREAN SECTION  2003   COLONOSCOPY WITH PROPOFOL N/A 09/14/2015   Procedure: COLONOSCOPY WITH PROPOFOL;  Surgeon: Lucilla Lame, MD;  Location: [place];  Service: Endoscopy;  Laterality: N/A;  Diabetic - insulin   CYSTOSCOPY W/ RETROGRADES Right 07/06/2017   Procedure: CYSTOSCOPY WITH RETROGRADE PYELOGRAM;  Surgeon: Hollice Espy, MD;  Location: [facility];  Service: Urology;  Laterality: Right;   CYSTOSCOPY W/ URETERAL STENT PLACEMENT Right 06/06/2017   Procedure: CYSTOSCOPY WITH RETROGRADE PYELOGRAM/URETERAL STENT PLACEMENT;  Surgeon: Lucas Mallow, MD;  Location: [facility];  Service: Urology;  Laterality: Right;   CYSTOSCOPY W/ URETERAL STENT PLACEMENT Right 07/06/2017   Procedure: CYSTOSCOPY WITH STENT REPLACEMENT;  Surgeon: Hollice Espy, MD;  Location: [facility];  Service: Urology;  Laterality: Right;   DIALYSIS/PERMA CATHETER INSERTION N/A 11/26/2021   Procedure: DIALYSIS/PERMA CATHETER INSERTION;  Surgeon: Katha Cabal, MD;  Location: [place] [facility];  Service: Cardiovascular;  Laterality: N/A;   ESOPHAGOGASTRODUODENOSCOPY (EGD) WITH PROPOFOL N/A 06/19/2016   Procedure: ESOPHAGOGASTRODUODENOSCOPY (EGD) WITH PROPOFOL;  Surgeon: Jonathon Bellows, MD;  Location: [facility];  Service: Endoscopy;  Laterality: N/A;   LAPAROSCOPY     LYSIS OF ADHESION     POLYPECTOMY  09/14/2015   Procedure: POLYPECTOMY;  Surgeon: Lucilla Lame, MD;  Location: [place];  Service: Endoscopy;;   RIGHT  OOPHORECTOMY  2012   RIGHT/LEFT HEART CATH AND CORONARY ANGIOGRAPHY N/A 11/28/2021   Procedure: RIGHT/LEFT HEART CATH AND CORONARY ANGIOGRAPHY;  Surgeon: Leonie Man, MD;  Location: [place] [facility];  Service: Cardiovascular;  Laterality: N/A;   TEMPORARY DIALYSIS CATHETER N/A 10/01/2021   Procedure: TEMPORARY DIALYSIS CATHETER;  Surgeon: Algernon Huxley, MD;  Location: [place] [facility];  Service: Cardiovascular;  Laterality: N/A;   TONSILLECTOMY  1987   and adenoids   TUBAL LIGATION  2004   VULVECTOMY  2011   lipoma     Social History:   reports that she has been smoking cigarettes. She has a 6.00 pack-year smoking history. She has never used smokeless tobacco. She reports that she does not drink alcohol and does not use drugs.   Family History:  Her family history includes Dementia in her maternal grandmother; Depression in her maternal grandmother and mother; Diabetes in her father,  maternal grandmother, and paternal grandmother; Heart disease in her maternal grandmother.   Allergies Allergies  Allergen Reactions   Cephalexin Other (See Comments) and Rash    Unknown   Penicillins Rash    Has patient had a PCN reaction causing immediate rash, facial/tongue/throat swelling, SOB or lightheadedness with hypotension: No Has patient had a PCN reaction causing severe rash involving mucus membranes or skin necrosis: No Has patient had a PCN reaction that required hospitalization: No Has patient had a PCN reaction occurring within the last 10 years: No If all of the above answers are "NO", then may proceed with Cephalosporin use.     Home Medications  Prior to Admission medications   Medication Sig Start Date End Date Taking? Authorizing Provider  aspirin EC 81 MG tablet Take 81 mg by mouth daily.   Yes [provider]  atorvastatin (LIPITOR) 80 MG tablet Take 80 mg by mouth daily. 12/04/21  Yes [provider]  carvedilol (COREG) 12.5 MG tablet Take 12.5 mg by  mouth 2 (two) times daily. 12/04/21  Yes [provider]  levothyroxine (SYNTHROID) 25 MCG tablet Take 1 tablet (25 mcg total) by mouth daily. 08/08/21  Yes Baity, Coralie Keens, NP  losartan (COZAAR) 25 MG tablet Take 1 tablet (25 mg total) by mouth daily. 12/11/21 04/10/22 Yes End, Harrell Gave, MD  metolazone (ZAROXOLYN) 2.5 MG tablet Take 1 tablet (2.5 mg total) by mouth every other day. 10/29/21  Yes Jearld Fenton, NP  sertraline (ZOLOFT) 25 MG tablet Take 1 tablet by mouth daily. 12/06/21  Yes [provider]  Accu-Chek Softclix Lancets lancets Use to check blood sugar daily for type 2 diabetes. E11.9 Patient not taking: Reported on 12/11/2021 06/27/21   Jearld Fenton, NP  amLODipine (NORVASC) 10 MG tablet Take 1 tablet (10 mg total) by mouth daily. Patient not taking: Reported on 11/18/2021 10/04/21   Fritzi Mandes, MD  gentamicin cream (GARAMYCIN) 0.1 % Apply 1 Application topically daily. Patient not taking: Reported on 11/18/2021 10/04/21   Fritzi Mandes, MD  glucose blood (ACCU-CHEK GUIDE) test strip 1 each by Other route See admin instructions. Patient not taking: Reported on 12/11/2021 07/08/21   Jearld Fenton, NP  labetalol (NORMODYNE) 100 MG tablet Take 1 tablet (100 mg total) by mouth 3 (three) times daily. Patient not taking: Reported on 11/18/2021 10/03/21   Fritzi Mandes, MD  methadone (DOLOPHINE) 10 MG/ML solution Take 20 mg by mouth daily. Changed by Dr Posey Pronto to get the right home dose w/o sending rx Patient not taking: Reported on 12/09/2021    [provider]  naloxone [facility]) nasal spray 4 mg/0.1 mL One spray in either nostril once for known/suspected opioid overdose. May repeat every 2-3 minutes in alternating nostril til EMS arrives Patient not taking: Reported on 12/11/2021 04/29/21   [provider]  nicotine (NICODERM CQ - DOSED IN MG/24 HOURS) 21 mg/24hr patch 21 mg daily. 12/05/21   [provider]  omeprazole (PRILOSEC) 20 MG capsule Take 1  capsule (20 mg total) by mouth daily. Patient not taking: Reported on 12/11/2021 08/07/21   Jearld Fenton, NP  torsemide (DEMADEX) 100 MG tablet Take 1 tablet (100 mg total) by mouth daily. Patient not taking: Reported on 12/11/2021 11/19/21   Hinda Kehr, MD       Critical Care Time devoted to patient care services described in this note is 75 minutes.   Critical care was necessary to treat /prevent imminent and life-threatening deterioration.  PATIENT WITH VERY POOR PROGNOSIS I ANTICIPATE PROLONGED ICU LOS  Patient is critically ill. Patient with Multiorgan failure and at high risk for cardiac arrest and death.    Corrin Parker, M.D.  [facility]  Medical Director [place] Director [facility] Cardio-Pulmonary Department

## 2021-12-24 NOTE — Progress Notes (Signed)
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 Propofol increased, patient loaded with 1 g of Keppra (given ESRD) and versed IVP provided. - discussed with [place] on call Neurologist, requesting approval for Ceribell, Dr. Cheral Marker in agreement - patient requiring Propofol at max rate, still with intermittent full body jerking requiring additional versed IVP - discussed with Dr. Hortense Ramal who relayed that the [place] appears to be epileptic - discussed with Rx will add an additional 500 mg of Keppra to provide close to a 20 mg/kg load as well as a 20 mg/ kg load of valproic acid - continue propofol, add fentanyl & versed drips wean as tolerated - EEG ordered in AM - Neurology consulted, appreciate input  Addendum: No clinical seizure activity post above interventions. Upon re-evaluation of Ceribell however, EEG remains abnormal per Dr. Cheral Marker with burst spikes. Recommendations included: - 10 mg versed bolus - increase versed drip to 15 mg continuously   Additional Critical Care time 30 minutes   Domingo Pulse [place], AGACNP-BC Acute Care Nurse Practitioner [facility]   [phone] / [phone] Please see Amion for pager details.

## 2021-12-24 NOTE — Progress Notes (Signed)
Responded to code Stemi, ( canceled). Checked in on patient who is intubated, no family present. Please page chaplain if needed.

## 2021-12-24 NOTE — Consult Note (Signed)
Cardiology Consultation   Patient ID: PAYTIENCE BURES MRN: [id number]; DOB: Nov 13, 1980  Admit date: 12/09/2021 Date of Consult: 12/16/2021  PCP:  [facility] Cardiologist:  Nelva Bush, MD      Patient Profile:   Nichole Wiley is a 41 y.o. female with a hx of longstanding diabetes mellitus complicated by Cesar Rogerson-stage renal disease previously on peritoneal dialysis complicated by PD catheter failure and peritonitis requiring transition to hemodialysis, severe multivessel coronary artery disease, ischemic cardiomyopathy with recent acute heart failure with mid range ejection fraction, type 1 diabetes mellitus, chronic pain, and prior opioid addiction on chronic methadone complicated by QT prolongation, who is being seen 12/12/2021 for the evaluation of cardiac arrest and possible STEMI at the request of Dr. Jacelyn Grip.  History of Present Illness:   History is from EMS and per review of the chart as the patient is currently intubated and unresponsive.  Ms. Barse was reportedly in her usual state of health at hemodialysis earlier today when she suddenly became short of breath and hypoxic about 3/4 of the way through her dialysis treatment.  EMS was summoned and found her to be hypoxic with oxygen saturations in the 70s.  She suddenly had cardiopulmonary arrest that required 2 rounds of epinephrine, 1 dose of sodium bicarbonate, and approximately 10 minutes of CPR before ROSC was achieved.  Per EMS, patient did not have any rhythm changes consistent with pulseless electrical activity.  Immediate post ROSC EKG showed 2-3 mm of anterior ST segment elevation that improved on repeat tracings.  Review of the chart is notable for Ms. Schnitzler presenting to the ED 6 days ago complaining of shortness of breath though she left from the waiting area before being seen by a provider.  Labs that day were notable for sodium 134, potassium 3.9, BNP greater than 45,000,  high-sensitivity troponin I 29, 30, and hemoglobin 10.7.  Ms. Nell had a prolonged hospitalization at [facility] last month after she suffered cardiac arrest in the setting of significant electrolyte derangements from a malfunctioning peritoneal dialysis catheter and acute peritonitis.  At that time, she had polymorphic ventricular tachycardia.  Echo showed moderately reduced LVEF.  She had prolonged intubation and required initiation of hemodialysis to aid with fluid removal.  She ultimately underwent right and left heart catheterization that showed moderate to severe three-vessel disease not well suited to PCI as well as moderately-severely elevated left and right heart filling pressures.  I saw her in follow-up on 12/11/2021, at which time she was actually feeling fairly well and much better than at her prior hospitalization.  I recommended initiation of losartan and more aggressive fluid removal with torsemide as well as hemodialysis.  She was subsequently seen at [facility] by outpatient cardiology, at which time she was felt to be mildly volume overloaded.  Further volume removal by hemodialysis was recommended.  Past Medical History:  Diagnosis Date   Abdominal mass    Arthritis    back   Chronic kidney disease    Colon polyp    Coronary artery disease 11/2021   Diabetes 1.5, managed as type 1 ([place])    Drug addiction (HCC)    hx (pain pills after c-sect)   GERD (gastroesophageal reflux disease)    Heart failure with mildly reduced ejection fraction (HFmrEF) ([place]) 11/2021   Hypertension    Pelvic floor dysfunction    Urine incontinence     Past Surgical History:  Procedure Laterality Date  ABDOMINAL HYSTERECTOMY  2005   CAPD REMOVAL N/A 11/22/2021   Procedure: CONTINUOUS AMBULATORY PERITONEAL DIALYSIS  (CAPD) CATHETER REMOVAL;  Surgeon: Benjamine Sprague, DO;  Location: [facility];  Service: General;  Laterality: N/A;   CESAREAN SECTION  2003   COLONOSCOPY WITH PROPOFOL N/A 09/14/2015   Procedure:  COLONOSCOPY WITH PROPOFOL;  Surgeon: Lucilla Lame, MD;  Location: [place];  Service: Endoscopy;  Laterality: N/A;  Diabetic - insulin   CYSTOSCOPY W/ RETROGRADES Right 07/06/2017   Procedure: CYSTOSCOPY WITH RETROGRADE PYELOGRAM;  Surgeon: Hollice Espy, MD;  Location: [facility];  Service: Urology;  Laterality: Right;   CYSTOSCOPY W/ URETERAL STENT PLACEMENT Right 06/06/2017   Procedure: CYSTOSCOPY WITH RETROGRADE PYELOGRAM/URETERAL STENT PLACEMENT;  Surgeon: Lucas Mallow, MD;  Location: [facility];  Service: Urology;  Laterality: Right;   CYSTOSCOPY W/ URETERAL STENT PLACEMENT Right 07/06/2017   Procedure: CYSTOSCOPY WITH STENT REPLACEMENT;  Surgeon: Hollice Espy, MD;  Location: [facility];  Service: Urology;  Laterality: Right;   DIALYSIS/PERMA CATHETER INSERTION N/A 11/26/2021   Procedure: DIALYSIS/PERMA CATHETER INSERTION;  Surgeon: Katha Cabal, MD;  Location: [place] [facility];  Service: Cardiovascular;  Laterality: N/A;   ESOPHAGOGASTRODUODENOSCOPY (EGD) WITH PROPOFOL N/A 06/19/2016   Procedure: ESOPHAGOGASTRODUODENOSCOPY (EGD) WITH PROPOFOL;  Surgeon: Jonathon Bellows, MD;  Location: [facility];  Service: Endoscopy;  Laterality: N/A;   LAPAROSCOPY     LYSIS OF ADHESION     POLYPECTOMY  09/14/2015   Procedure: POLYPECTOMY;  Surgeon: Lucilla Lame, MD;  Location: [place];  Service: Endoscopy;;   RIGHT OOPHORECTOMY  2012   RIGHT/LEFT HEART CATH AND CORONARY ANGIOGRAPHY N/A 11/28/2021   Procedure: RIGHT/LEFT HEART CATH AND CORONARY ANGIOGRAPHY;  Surgeon: Leonie Man, MD;  Location: [place] [facility];  Service: Cardiovascular;  Laterality: N/A;   TEMPORARY DIALYSIS CATHETER N/A 10/01/2021   Procedure: TEMPORARY DIALYSIS CATHETER;  Surgeon: Algernon Huxley, MD;  Location: [place] [facility];  Service: Cardiovascular;  Laterality: N/A;   TONSILLECTOMY  1987   and adenoids   TUBAL LIGATION  2004   VULVECTOMY  2011   lipoma     Home Medications:  Prior to  Admission medications   Medication Sig Start Date Sanaii Caporaso Date Taking? Authorizing Provider  aspirin EC 81 MG tablet Take 81 mg by mouth daily.   Yes [provider]  atorvastatin (LIPITOR) 80 MG tablet Take 80 mg by mouth daily. 12/04/21  Yes [provider]  carvedilol (COREG) 12.5 MG tablet Take 12.5 mg by mouth 2 (two) times daily. 12/04/21  Yes [provider]  levothyroxine (SYNTHROID) 25 MCG tablet Take 1 tablet (25 mcg total) by mouth daily. 08/08/21  Yes Baity, Coralie Keens, NP  losartan (COZAAR) 25 MG tablet Take 1 tablet (25 mg total) by mouth daily. 12/11/21 04/10/22 Yes Alcides Nutting, Harrell Gave, MD  metolazone (ZAROXOLYN) 2.5 MG tablet Take 1 tablet (2.5 mg total) by mouth every other day. 10/29/21  Yes Jearld Fenton, NP  sertraline (ZOLOFT) 25 MG tablet Take 1 tablet by mouth daily. 12/06/21  Yes [provider]  Accu-Chek Softclix Lancets lancets Use to check blood sugar daily for type 2 diabetes. E11.9 Patient not taking: Reported on 12/11/2021 06/27/21   Jearld Fenton, NP  amLODipine (NORVASC) 10 MG tablet Take 1 tablet (10 mg total) by mouth daily. Patient not taking: Reported on 11/18/2021 10/04/21   Fritzi Mandes, MD  gentamicin cream (GARAMYCIN) 0.1 % Apply 1 Application topically daily. Patient not taking: Reported on  11/18/2021 10/04/21   Fritzi Mandes, MD  glucose blood (ACCU-CHEK GUIDE) test strip 1 each by Other route See admin instructions. Patient not taking: Reported on 12/11/2021 07/08/21   Jearld Fenton, NP  labetalol (NORMODYNE) 100 MG tablet Take 1 tablet (100 mg total) by mouth 3 (three) times daily. Patient not taking: Reported on 11/18/2021 10/03/21   Fritzi Mandes, MD  methadone (DOLOPHINE) 10 MG/ML solution Take 20 mg by mouth daily. Changed by Dr Posey Pronto to get the right home dose w/o sending rx Patient not taking: Reported on 01/02/2022    [provider]  naloxone [facility]) nasal spray 4 mg/0.1 mL One spray in either nostril once for known/suspected  opioid overdose. May repeat every 2-3 minutes in alternating nostril til EMS arrives Patient not taking: Reported on 12/11/2021 04/29/21   [provider]  nicotine (NICODERM CQ - DOSED IN MG/24 HOURS) 21 mg/24hr patch 21 mg daily. 12/05/21   [provider]  omeprazole (PRILOSEC) 20 MG capsule Take 1 capsule (20 mg total) by mouth daily. Patient not taking: Reported on 12/11/2021 08/07/21   Jearld Fenton, NP  torsemide (DEMADEX) 100 MG tablet Take 1 tablet (100 mg total) by mouth daily. Patient not taking: Reported on 12/11/2021 11/19/21   Hinda Kehr, MD    Inpatient Medications: Scheduled Meds:  Continuous Infusions:  propofol     PRN Meds: propofol  Allergies:    Allergies  Allergen Reactions   Cephalexin Other (See Comments) and Rash    Unknown   Penicillins Rash    Has patient had a PCN reaction causing immediate rash, facial/tongue/throat swelling, SOB or lightheadedness with hypotension: No Has patient had a PCN reaction causing severe rash involving mucus membranes or skin necrosis: No Has patient had a PCN reaction that required hospitalization: No Has patient had a PCN reaction occurring within the last 10 years: No If all of the above answers are "NO", then may proceed with Cephalosporin use.    Social History:   Social History   Tobacco Use   Smoking status: Every Day    Packs/day: 0.25    Years: 24.00    Total pack years: 6.00    Types: Cigarettes   Smokeless tobacco: Never   Tobacco comments:    has cut down from PPD   Vaping Use   Vaping Use: Never used  Substance Use Topics   Alcohol use: No   Drug use: No      Family History:   Family History  Problem Relation Age of Onset   Depression Mother    Diabetes Father    Heart disease Maternal Grandmother    Depression Maternal Grandmother    Dementia Maternal Grandmother    Diabetes Maternal Grandmother    Diabetes Paternal Grandmother      ROS:  Unable to obtain as the  patient is intubated and sedated.  Physical Exam/Data:   Vitals:   12/17/2021 1530 01/06/2022 1532  BP:  131/89  Pulse: (!) 105 (!) 112  Resp: 15 16  SpO2:  100%   No intake or output data in the 24 hours ending 12/18/2021 1544    12/18/2021    4:00 PM 12/11/2021   10:50 AM 11/28/2021   11:30 AM  Last 3 Weights  Weight (lbs) 176 lb 176 lb 9.6 oz 164 lb 10.9 oz  Weight (kg) 79.833 kg 80.105 kg 74.7 kg     There is no height or weight on file to calculate BMI.  General:  Unresponsive woman, intubated, lying on stretcher in the ED. HEENT: Endotracheal tube present Neck: To assess JVP due to patient positioning and support devices. Cardiac:  normal S1, S2; RRR.  No murmurs. Lungs:  clear anteriorly. Abd: soft, nontender, no hepatomegaly  Ext: Warm extremities with 1+ pretibial edema bilaterally.  IO catheter in place in the left tibia. Skin: warm and dry  Neuro: Intubated and sedated.  Occasionally tries to overbreathing the ventilator.  EKG:  The EKG was personally reviewed and demonstrates: Normal sinus rhythm with left atrial enlargement, anterolateral T wave inversions, and prolonged QT. Telemetry:  Telemetry was personally reviewed and demonstrates: Sinus tachycardia  Relevant CV Studies: R/LHC (11/28/2021): Diffuse multivessel disease with very small caliber vessels no obvious culprit lesion: Notable lesions include: 90% and 80% RV marginal branches, 90% PLV 1, 65% LAD with 90% D1, 60% LCx-OM was with diffuse 40 and 60% mid to distal LAD LAD => small caliber vessels make CABG likely to be unfavorable, but will discuss with [facility] Moderate to severe pulmonary hypertension with mean PAP 43 mmHg  with PCWP 34 mmHg.  LVP-EDP 162/12 mmHg - 40 mmHg, and AoP-MAP of 160/94-121 mmHg Mild to moderate reduction in Cardiac Output-Index by Thermodilution (4.13-2.23) with mild by Fick (5.07-2.74)  TTE (11/23/2021):  1. Left ventricular ejection fraction, by estimation, is 40 to 45%. The   left ventricle has mildly decreased function. The left ventricle  demonstrates global hypokinesis. Left ventricular diastolic parameters are  indeterminate. Elevated left ventricular  Oday Ridings-diastolic pressure.   2. Right ventricular systolic function is normal. The right ventricular  size is normal.   3. Large pleural effusion in the left lateral region.   4. The mitral valve is normal in structure. Trivial mitral valve  regurgitation. No evidence of mitral stenosis.   5. The aortic valve is tricuspid. There is moderate calcification of the  aortic valve. Aortic valve regurgitation is not visualized. Aortic valve  sclerosis/calcification is present, without any evidence of aortic  stenosis. Aortic valve area, by VTI  measures 2.39 cm. Aortic valve mean gradient measures 3.0 mmHg. Aortic  valve Vmax measures 1.23 m/s.   6. The inferior vena cava is dilated in size with <50% respiratory  variability, suggesting right atrial pressure of 15 mmHg.   Laboratory Data:  High Sensitivity Troponin:   Recent Labs  Lab 11/27/21 0427 11/27/21 1155 11/28/21 0453 12/18/21 1612 12/18/21 1857  TROPONINIHS 4,204* 2,534* 3,006* 29* 30*     Chemistry Recent Labs  Lab 12/18/21 1612  NA 134*  K 3.9  CL 100  CO2 24  GLUCOSE 116*  BUN 23*  CREATININE 6.37*  CALCIUM 8.7*  GFRNONAA 8*  ANIONGAP 10    No results for input(s): "PROT", "ALBUMIN", "AST", "ALT", "ALKPHOS", "BILITOT" in the last 168 hours. Lipids No results for input(s): "CHOL", "TRIG", "HDL", "LABVLDL", "LDLCALC", "CHOLHDL" in the last 168 hours.  Hematology Recent Labs  Lab 12/18/21 1612  WBC 6.2  RBC 3.76*  HGB 10.7*  HCT 34.8*  MCV 92.6  MCH 28.5  MCHC 30.7  RDW 15.6*  PLT 243   Thyroid No results for input(s): "TSH", "FREET4" in the last 168 hours.  BNP Recent Labs  Lab 12/18/21 1612  BNP >4,500.0*    DDimer No results for input(s): "DDIMER" in the last 168 hours.   Radiology/Studies:  No results  found.   Assessment and Plan:   Cardiac arrest: Patient had sudden onset of shortness of breath at hemodialysis with PEA.  She is tachycardic post ROSC.  Given her recent prolonged hospitalization with ICU stay, pulmonary embolism is a significant concern.  Acute on chronic HFrEF is also a possibility, given concerns for progressive fluid accumulation at recent outpatient visits and known reduced LVEF with moderately-severely elevated filling pressures by catheterization last month. -Recommend obtaining CTA chest. -If no evidence of pulmonary embolism, I would advocate for aggressive fluid removal; initiation of CVVH or daily intermittent hemodialysis strongly recommended. -Though no evidence of amorphic ventricular tachycardia at this time, she previously suffered cardiac arrest due to this.  Her QTc remains prolonged.  She is in the process of being weaned from methadone and transition to Suboxone as an outpatient.  QT prolonging medications should be avoided. -Repeat limited echocardiogram to reassess LVEF. -Consider targeted temperature management at the discretion of critical care team.  Coronary artery disease: Ms. Angert has known moderate-severe multivessel coronary artery disease.  Her initial EMS EKG after ROSC showed anterior ST elevation, which is not evident on subsequent tracings by EMS and in the ED.  I suspect she had ischemia in the setting of her known CAD and cardiac arrest.  She does not meet STEMI criteria; emergent cardiac catheterization is not recommended as she does not have good targets for PCI. -Trend high-sensitivity troponin I; consider addition of heparin if troponin increases significantly and there is no evidence of severe anemia or ongoing blood loss. -Continue aspirin and statin therapy. -Reintroduce amlodipine and carvedilol for antianginal therapy as blood pressure allows.  Chronic HFrEF: LVEF noted to be 40-45% with moderately-severely elevated left and right  heart filling pressures by catheterization last month. -Repeat limited echo to ensure LVEF has not declined further in the setting of cardiac arrest. -Judiciously reintroduce carvedilol and losartan as blood pressure allow. -I suspect Ms. Pompa is significantly volume overloaded.  Recommend escalation of ultrafiltration via CVVH or intermittent HD.  Augmentation with high-dose IV furosemide +/- metolazone could be employed at was well.  QT prolongation: Patient is in the process of being transitioned from methadone to Suboxone in the setting of QT prolongation. -Recommend continued weaning of methadone as tolerated. -Avoid other QT prolonging medications including antiemetics such as ondansetron. -Pleat potassium and magnesium to maintain levels greater than 4.0 and 2.0, respectively.  Type 1 diabetes mellitus: -Per primary team.    For questions or updates, please contact [place] Please consult [email or web address] for contact info under [facility] Cardiology.  Signed, Nelva Bush, MD  01/06/2022 3:44 PM

## 2021-12-24 NOTE — Progress Notes (Signed)
Pt transported to ICU on the vent without incident. Pt remains on the vent and is tol well at this time. Report given to ICU RT. 

## 2021-12-24 NOTE — ED Triage Notes (Signed)
Pt via EMS from [facility]. Pt was about 2/3 into her treatment with a sudden onset of SOB. EMS arrival and pt had some "seizure like activity." En route EMS initiated CPR and did 5 rounds of CPR, 3 EPI, and 1 amp of sodium bicarbonate. On arrival, pt is being bagged. Pt has pulses.

## 2021-12-24 NOTE — Progress Notes (Signed)
Patient transported to CT and back without complication. 

## 2021-12-25 ENCOUNTER — Inpatient Hospital Stay: Payer: Medicaid Other

## 2021-12-25 DIAGNOSIS — R569 Unspecified convulsions: Secondary | ICD-10-CM | POA: Diagnosis not present

## 2021-12-25 DIAGNOSIS — I469 Cardiac arrest, cause unspecified: Secondary | ICD-10-CM | POA: Diagnosis not present

## 2021-12-25 DIAGNOSIS — Z7189 Other specified counseling: Secondary | ICD-10-CM | POA: Diagnosis not present

## 2021-12-25 LAB — CBC
HCT: 32.7 % — ABNORMAL LOW (ref 36.0–46.0)
Hemoglobin: 10.2 g/dL — ABNORMAL LOW (ref 12.0–15.0)
MCH: 28.6 pg (ref 26.0–34.0)
MCHC: 31.2 g/dL (ref 30.0–36.0)
MCV: 91.6 fL (ref 80.0–100.0)
Platelets: 182 10*3/uL (ref 150–400)
RBC: 3.57 MIL/uL — ABNORMAL LOW (ref 3.87–5.11)
RDW: 16.4 % — ABNORMAL HIGH (ref 11.5–15.5)
WBC: 8.9 10*3/uL (ref 4.0–10.5)
nRBC: 0 % (ref 0.0–0.2)

## 2021-12-25 LAB — MAGNESIUM
Magnesium: 1.9 mg/dL (ref 1.7–2.4)
Magnesium: 1.9 mg/dL (ref 1.7–2.4)

## 2021-12-25 LAB — ECHOCARDIOGRAM LIMITED
Area-P 1/2: 4.39 cm2
Height: 67 in
S' Lateral: 4.1 cm
Weight: 2850.11 [oz_av]

## 2021-12-25 LAB — RENAL FUNCTION PANEL
Albumin: 2.6 g/dL — ABNORMAL LOW (ref 3.5–5.0)
Anion gap: 11 (ref 5–15)
BUN: 19 mg/dL (ref 6–20)
CO2: 24 mmol/L (ref 22–32)
Calcium: 8.3 mg/dL — ABNORMAL LOW (ref 8.9–10.3)
Chloride: 107 mmol/L (ref 98–111)
Creatinine, Ser: 5.29 mg/dL — ABNORMAL HIGH (ref 0.44–1.00)
GFR, Estimated: 10 mL/min — ABNORMAL LOW (ref >60)
Glucose, Bld: 96 mg/dL (ref 70–99)
Phosphorus: 3.3 mg/dL (ref 2.5–4.6)
Potassium: 3.4 mmol/L — ABNORMAL LOW (ref 3.5–5.1)
Sodium: 142 mmol/L (ref 135–145)

## 2021-12-25 LAB — GLUCOSE, CAPILLARY
Glucose-Capillary: 67 mg/dL — ABNORMAL LOW (ref 70–99)
Glucose-Capillary: 70 mg/dL (ref 70–99)
Glucose-Capillary: 74 mg/dL (ref 70–99)
Glucose-Capillary: 86 mg/dL (ref 70–99)
Glucose-Capillary: 91 mg/dL (ref 70–99)

## 2021-12-25 LAB — HEPATIC FUNCTION PANEL
ALT: 24 U/L (ref 0–44)
AST: 34 U/L (ref 15–41)
Albumin: 2.5 g/dL — ABNORMAL LOW (ref 3.5–5.0)
Alkaline Phosphatase: 150 U/L — ABNORMAL HIGH (ref 38–126)
Bilirubin, Direct: <0.1 mg/dL (ref 0.0–0.2)
Total Bilirubin: 0.7 mg/dL (ref 0.3–1.2)
Total Protein: 5.2 g/dL — ABNORMAL LOW (ref 6.5–8.1)

## 2021-12-25 LAB — BASIC METABOLIC PANEL
Anion gap: 11 (ref 5–15)
BUN: 20 mg/dL (ref 6–20)
CO2: 24 mmol/L (ref 22–32)
Calcium: 8.5 mg/dL — ABNORMAL LOW (ref 8.9–10.3)
Chloride: 106 mmol/L (ref 98–111)
Creatinine, Ser: 5.22 mg/dL — ABNORMAL HIGH (ref 0.44–1.00)
GFR, Estimated: 10 mL/min — ABNORMAL LOW (ref >60)
Glucose, Bld: 98 mg/dL (ref 70–99)
Potassium: 3.4 mmol/L — ABNORMAL LOW (ref 3.5–5.1)
Sodium: 141 mmol/L (ref 135–145)

## 2021-12-25 LAB — MRSA NEXT GEN BY PCR, NASAL: MRSA by PCR Next Gen: NOT DETECTED

## 2021-12-25 LAB — TROPONIN I (HIGH SENSITIVITY)
Troponin I (High Sensitivity): 241 ng/L (ref <18)
Troponin I (High Sensitivity): 177 ng/L (ref <18)
Troponin I (High Sensitivity): 149 ng/L (ref <18)

## 2021-12-25 LAB — PROCALCITONIN: Procalcitonin: 0.58 ng/mL

## 2021-12-25 LAB — PHOSPHORUS: Phosphorus: 3.3 mg/dL (ref 2.5–4.6)

## 2021-12-25 LAB — TRIGLYCERIDES: Triglycerides: 188 mg/dL — ABNORMAL HIGH (ref <150)

## 2021-12-25 MED ORDER — OSMOLITE 1.5 CAL PO LIQD: 1000.0000 mL | ORAL | Status: DC

## 2021-12-25 MED ORDER — MIDAZOLAM BOLUS VIA INFUSION
10.0000 mg | Freq: Once | INTRAVENOUS | Status: AC
  Administered 2021-12-25: 10 mg via INTRAVENOUS
  Filled 2021-12-25: qty 10

## 2021-12-25 MED ORDER — LIDOCAINE HCL (PF) 1 % IJ SOLN: 5.0000 mL | INTRAMUSCULAR | Status: DC | PRN

## 2021-12-25 MED ORDER — ALTEPLASE 2 MG IJ SOLR: 2.0000 mg | Freq: Once | INTRAMUSCULAR | Status: DC | PRN

## 2021-12-25 MED ORDER — ONDANSETRON HCL 4 MG/2ML IJ SOLN: 4.0000 mg | Freq: Four times a day (QID) | INTRAMUSCULAR | Status: DC | PRN

## 2021-12-25 MED ORDER — FREE WATER: 20.0000 mL | Status: DC

## 2021-12-25 MED ORDER — ANTICOAGULANT SODIUM CITRATE 4% (200MG/5ML) IV SOLN: 5.0000 mL | Status: DC | PRN

## 2021-12-25 MED ORDER — LIDOCAINE-PRILOCAINE 2.5-2.5 % EX CREA: 1.0000 | TOPICAL_CREAM | CUTANEOUS | Status: DC | PRN

## 2021-12-25 MED ORDER — PENTAFLUOROPROP-TETRAFLUOROETH EX AERO: 1.0000 | INHALATION_SPRAY | CUTANEOUS | Status: DC | PRN

## 2021-12-25 MED ORDER — VITAL HIGH PROTEIN PO LIQD: 1000.0000 mL | ORAL | Status: DC

## 2021-12-25 MED ORDER — ACETAMINOPHEN 650 MG RE SUPP: 650.0000 mg | Freq: Four times a day (QID) | RECTAL | Status: DC | PRN

## 2021-12-25 MED ORDER — DEXTROSE 50 % IV SOLN
12.5000 g | Freq: Once | INTRAVENOUS | Status: AC
  Administered 2021-12-25: 12.5 g via INTRAVENOUS

## 2021-12-25 MED ORDER — HEPARIN SODIUM (PORCINE) 1000 UNIT/ML DIALYSIS: 1000.0000 [IU] | INTRAMUSCULAR | Status: DC | PRN

## 2021-12-25 MED ORDER — SODIUM CHLORIDE 0.9 % IV SOLN
1560.0000 mg | Freq: Once | INTRAVENOUS | Status: AC
  Administered 2021-12-25: 1560 mg via INTRAVENOUS
  Filled 2021-12-25: qty 12

## 2021-12-25 MED ORDER — DEXTROSE 10 % IV SOLN: INTRAVENOUS | Status: DC

## 2021-12-25 MED ORDER — PROSOURCE TF20 ENFIT COMPATIBL EN LIQD: 60.0000 mL | Freq: Two times a day (BID) | ENTERAL | Status: DC

## 2021-12-25 MED ORDER — POLYVINYL ALCOHOL 1.4 % OP SOLN: 1.0000 [drp] | Freq: Four times a day (QID) | OPHTHALMIC | Status: DC | PRN

## 2021-12-25 MED ORDER — NOREPINEPHRINE 4 MG/250ML-% IV SOLN
2.0000 ug/min | INTRAVENOUS | Status: DC
  Filled 2021-12-25: qty 250

## 2021-12-25 MED ORDER — GLYCOPYRROLATE 0.2 MG/ML IJ SOLN: 0.2000 mg | INTRAMUSCULAR | Status: DC | PRN

## 2021-12-25 MED ORDER — ROCURONIUM BROMIDE 10 MG/ML (PF) SYRINGE
50.0000 mg | PREFILLED_SYRINGE | Freq: Once | INTRAVENOUS | Status: AC
  Administered 2021-12-25: 50 mg via INTRAVENOUS
  Filled 2021-12-25: qty 10

## 2021-12-25 MED ORDER — SODIUM CHLORIDE 0.9 % IV SOLN
250.0000 mL | INTRAVENOUS | Status: DC
  Administered 2021-12-25: 250 mL via INTRAVENOUS

## 2021-12-27 ENCOUNTER — Ambulatory Visit: Payer: Medicaid Other | Admitting: Medical

## 2021-12-27 LAB — URINE CULTURE: Culture: 3000 — AB

## 2021-12-29 LAB — CULTURE, BLOOD (ROUTINE X 2)
Culture: NO GROWTH
Culture: NO GROWTH
Special Requests: ADEQUATE

## 2022-01-02 ENCOUNTER — Encounter: Payer: Self-pay | Admitting: Surgery

## 2022-01-08 NOTE — Progress Notes (Signed)
Rounding Note    Patient Name: Nichole Wiley Date of Encounter: 24-Jan-2022  [place] Cardiologist: Nelva Bush, MD   Subjective   Patient seen on AM rounds. Remains intubated and sedated. Potassium 3.4 and Mg 1.9, slightly low this morning.   Inpatient Medications    Scheduled Meds:  Chlorhexidine Gluconate Cloth  6 each Topical Q0600   docusate  100 mg Per Tube BID   heparin  5,000 Units Subcutaneous Q8H   mouth rinse  15 mL Mouth Rinse Q2H   pantoprazole (PROTONIX) IV  40 mg Intravenous QHS   polyethylene glycol  17 g Per Tube Daily   sodium chloride flush  3 mL Intravenous Q12H   Continuous Infusions:  sodium chloride     sodium chloride     dextrose 20 mL/hr at January 24, 2022 0605   fentaNYL infusion INTRAVENOUS 100 mcg/hr (01-24-2022 0400)   levETIRAcetam     midazolam 15 mg/hr (01/24/22 0539)   norepinephrine (LEVOPHED) Adult infusion     propofol (DIPRIVAN) infusion 80 mcg/kg/min (01-24-22 0641)   PRN Meds: sodium chloride, acetaminophen, docusate, midazolam, mouth rinse, polyethylene glycol, sodium chloride flush   Vital Signs    Vitals:   2022/01/24 0400 2022/01/24 0429 2022/01/24 0500 01-24-22 0600  BP:   102/70 92/65  Pulse:   90 91  Resp:   18 18  Temp: 98.6 F (37 C)  98.6 F (37 C) 98.6 F (37 C)  TempSrc:      SpO2:  99% 99% 99%  Weight:      Height:        Intake/Output Summary (Last 24 hours) at 01/24/22 0738 Last data filed at 24-Jan-2022 0400 Gross per 24 hour  Intake 686.21 ml  Output 565 ml  Net 121.21 ml      January 24, 2022    3:52 AM 12/27/2021    9:45 PM 12/27/2021    3:48 PM  Last 3 Weights  Weight (lbs) 176 lb 12.9 oz 178 lb 2.1 oz 178 lb 2.1 oz  Weight (kg) 80.2 kg 80.8 kg 80.8 kg      Telemetry    Sinus with rates in the 80's - Personally Reviewed  ECG    No new tracings - Personally Reviewed  Physical Exam   GEN: No acute distress. Remains intubated and sedated. Neck: Unable to assess JVD due to ETT  holder and lines Cardiac: RRR, no murmurs, rubs, or gallops.  Respiratory: Clear to auscultation bilaterally. Respirations are vent assisted.  GI: Soft, nontender, non-distended, OGT intact MS: 1+ pretibial edema; No deformity. Neuro:  Unable to assess, intubated and sedated, pupils are nonreactive and unequal (R 83m and L 394m Psych: Unable to assess as she remains intubated and sedated  Labs    High Sensitivity Troponin:   Recent Labs  Lab 12/18/21 1857 01/05/2022 1533 01/07/2022 1925 [id number] 10November 17, [id number]  TROPONINIHS 30* 63* 211* 241* 177*     Chemistry Recent Labs  Lab 12/20/2021 1533 12/17/2021 1925 [id number]  NA 138 139 141  142  K 3.6 4.1 3.4*  3.4*  CL 103 100 106  107  CO2 20* '26 24  24  '$ GLUCOSE 233* 162* 98  96  BUN '13 16 20  19  '$ CREATININE 4.82* 4.99* 5.22*  5.29*  CALCIUM 7.7* 8.5* 8.5*  8.3*  MG  --  1.8 1.9  PROT  --  5.9* 5.2*  ALBUMIN  --  2.7* 2.5*  2.6*  AST  --  42*  34  ALT  --  29 24  ALKPHOS  --  174* 150*  BILITOT  --  1.0 0.7  GFRNONAA 11* 10* 10*  10*  ANIONGAP '15 13 11  11    '$ Lipids  Recent Labs  Lab 01/01/22 0047  TRIG 188*    Hematology Recent Labs  Lab 12/18/21 1612 01/06/2022 1533 2022/01/01 0047  WBC 6.2 8.1 8.9  RBC 3.76* 3.86* 3.57*  HGB 10.7* 10.9* 10.2*  HCT 34.8* 37.0 32.7*  MCV 92.6 95.9 91.6  MCH 28.5 28.2 28.6  MCHC 30.7 29.5* 31.2  RDW 15.6* 16.4* 16.4*  PLT 243 217 182   Thyroid No results for input(s): "TSH", "FREET4" in the last 168 hours.  BNP Recent Labs  Lab 12/18/21 1612  BNP >4,500.0*    DDimer No results for input(s): "DDIMER" in the last 168 hours.   Radiology    ECHOCARDIOGRAM LIMITED  Result Date: 01/01/2022    ECHOCARDIOGRAM LIMITED REPORT   Patient Name:   Nichole Wiley Date of Exam: 12/30/2021 Medical Rec #:  [id number]       Height:       67.0 in Accession #:    [id number]      Weight:       178.1 lb Date of Birth:  1981-02-02       BSA:          1.925 m Patient Age:    41  years        BP:           145/90 mmHg Patient Gender: F               HR:           78 bpm. Exam Location:  [facility] Procedure: 2D Echo, Limited Echo, Limited Color Doppler and Intracardiac            Opacification Agent Indications:     I46.9 Cardiac Arrest  History:         Patient has prior history of Echocardiogram examinations, most                  recent 11/23/2021. CAD; Risk Factors:Hypertension. Chronic                  kidney disease.  Sonographer:     Cresenciano Lick RDCS Referring Phys:  [id number] CHRISTOPHER END Diagnosing Phys: Nelva Bush MD IMPRESSIONS  1. Left ventricular ejection fraction, by estimation, is 25 to 30%. The left ventricle has severely decreased function. The left ventricle demonstrates regional wall motion abnormalities (see scoring diagram/findings for description). The left ventricular internal cavity size was mildly dilated. Left ventricular diastolic parameters are consistent with Grade I diastolic dysfunction (impaired relaxation). There is global hypokinesis with relative sparing of the basal segments.  2. Right ventricular systolic function is low normal. The right ventricular size is normal. Tricuspid regurgitation signal is inadequate for assessing PA pressure.  3. A small pericardial effusion is present. The pericardial effusion is circumferential. There is no evidence of cardiac tamponade.  4. No evidence of mitral valve regurgitation. No evidence of mitral stenosis.  5. There is mild calcification of the aortic valve. There is moderate thickening of the aortic valve. FINDINGS  Left Ventricle: Left ventricular ejection fraction, by estimation, is 25 to 30%. The left ventricle has severely decreased function. The left ventricle demonstrates regional wall motion abnormalities. Definity contrast agent was given IV to delineate the left ventricular endocardial borders. The left  ventricular internal cavity size was mildly dilated. There is no left ventricular hypertrophy.  Left ventricular diastolic parameters are consistent with Grade I diastolic dysfunction (impaired relaxation).  LV Wall Scoring: There is global hypokinesis with relative sparing of the basal segments. Right Ventricle: The right ventricular size is normal. Right ventricular systolic function is low normal. Tricuspid regurgitation signal is inadequate for assessing PA pressure. Pericardium: A small pericardial effusion is present. The pericardial effusion is circumferential. There is no evidence of cardiac tamponade. Mitral Valve: There is mild thickening of the mitral valve leaflet(s). No evidence of mitral valve stenosis. Tricuspid Valve: The tricuspid valve is not well visualized. Tricuspid valve regurgitation is not demonstrated. Aortic Valve: There is mild calcification of the aortic valve. There is moderate thickening of the aortic valve. Venous: IVC assessment for right atrial pressure unable to be performed due to mechanical ventilation. LEFT VENTRICLE PLAX 2D LVIDd:         5.60 cm Diastology LVIDs:         4.10 cm LV e' medial:    4.35 cm/s LV PW:         1.00 cm LV E/e' medial:  11.8 LV IVS:        0.80 cm LV e' lateral:   4.79 cm/s                        LV E/e' lateral: 10.7  RIGHT VENTRICLE             IVC RV S prime:     11.90 cm/s  IVC diam: 2.10 cm TAPSE (M-mode): 1.7 cm LEFT ATRIUM         Index LA diam:    3.60 cm 1.87 cm/m   AORTA Ao Root diam: 3.20 cm MITRAL VALVE MV Area (PHT): 4.39 cm MV Decel Time: 173 msec MV E velocity: 51.30 cm/s MV A velocity: 65.30 cm/s MV E/A ratio:  0.79 Christopher End MD Electronically signed by Nelva Bush MD Signature Date/Time: 01/14/22/7:04:25 AM    Final    CT HEAD WO CONTRAST (5MM)  Result Date: Jan 14, 2022 CLINICAL DATA:  Seizure disorder, clinical change EXAM: CT HEAD WITHOUT CONTRAST TECHNIQUE: Contiguous axial images were obtained from the base of the skull through the vertex without intravenous contrast. RADIATION DOSE REDUCTION: This exam was  performed according to the departmental dose-optimization program which includes automated exposure control, adjustment of the mA and/or kV according to patient size and/or use of iterative reconstruction technique. COMPARISON:  12/14/2021 FINDINGS: Brain: No acute intracranial abnormality. Specifically, no hemorrhage, hydrocephalus, mass lesion, acute infarction, or significant intracranial injury. Vascular: No hyperdense vessel or unexpected calcification. Skull: No acute calvarial abnormality. Sinuses/Orbits: Mucosal thickening throughout the paranasal sinuses. Air-fluid level in the right maxillary sinus. Other: None IMPRESSION: No acute intracranial abnormality. Electronically Signed   By: Rolm Baptise M.D.   On: 01/14/2022 00:27   CT Angio Chest PE W/Cm &/Or Wo Cm  Result Date: 12/27/2021 CLINICAL DATA:  Pulmonary embolism suspected. Longstanding diabetes mellitus with end-stage renal disease on peritoneal dialysis. Complicated by peritoneal dialysis catheter failure and peritenonitis. Abdominal pain EXAM: CT ANGIOGRAPHY CHEST CT ABDOMEN AND PELVIS WITH CONTRAST TECHNIQUE: Multidetector CT imaging of the chest was performed using the standard protocol during bolus administration of intravenous contrast. Multiplanar CT image reconstructions and MIPs were obtained to evaluate the vascular anatomy. Multidetector CT imaging of the abdomen and pelvis was performed using the standard protocol during bolus administration of intravenous contrast.  RADIATION DOSE REDUCTION: This exam was performed according to the departmental dose-optimization program which includes automated exposure control, adjustment of the mA and/or kV according to patient size and/or use of iterative reconstruction technique. CONTRAST:  68m OMNIPAQUE IOHEXOL 350 MG/ML SOLN COMPARISON:  None Available. FINDINGS: CTA CHEST FINDINGS Cardiovascular: Satisfactory opacification of the pulmonary arteries to the segmental level. No evidence of  pulmonary embolism. Normal heart size. No pericardial effusion. Mediastinum/Nodes: No enlarged mediastinal, hilar, or axillary lymph nodes. Endotracheal tube and feeding tube in place. Thyroid gland, trachea, and esophagus demonstrate no significant findings. Lungs/Pleura: Large bilateral pleural effusions with dependent atelectasis of the bilateral lower lobes. No evidence of pneumonia. Smooth interlobular septal thickening concerning for pulmonary edema. Musculoskeletal: Moderate generalized anasarca. No acute or significant osseous findings. Review of the MIP images confirms the above findings. CT ABDOMEN and PELVIS FINDINGS Hepatobiliary: No focal liver abnormality is seen. No gallstones, gallbladder wall thickening, or biliary dilatation. Pancreas: Mild generalized pancreatic atrophy. No pancreatic ductal dilatation or surrounding inflammatory changes. Spleen: Normal in size without focal abnormality. Adrenals/Urinary Tract: Adrenal glands are unremarkable. Bilateral renal cortical atrophy. No evidence of nephrolithiasis or hydronephrosis. Bladder is contracted about Foley's catheter. Stomach/Bowel: Stomach is normal with NG tube in the body of the stomach. Small bowel loops are normal in caliber. There are fluid-filled colonic loops concerning for ileus. No bowel wall thickening. Vascular/Lymphatic: Moderate aortic atherosclerosis of infrarenal abdominal aorta and branch vessels. No enlarged abdominal or pelvic lymph nodes. Reproductive: Status post hysterectomy. No adnexal masses. Left adnexal cyst measuring up to 1 point 4 cm. Other: Small pelvic ascites. Moderate generalized anasarca of the abdominal wall. Small fat containing umbilical hernia. Musculoskeletal: No fracture is seen. Review of the MIP images confirms the above findings. IMPRESSION: 1. No evidence of pulmonary embolism. 2. Large bilateral pleural effusions with dependent atelectasis of the bilateral lower lobes. 3. Smooth interlobular septal  thickening concerning for pulmonary edema. 4. Moderate generalized anasarca of the chest  and abdominal wall. 5. Fluid-filled colonic loops concerning for ileus. No evidence of bowel obstruction. 6. Small pelvic ascites. 7. Bilateral renal cortical atrophy. 8. Aortic Atherosclerosis (ICD10-I70.0). Electronically Signed   By: IKeane PoliceD.O.   On: 12/21/2021 17:08   CT Abdomen Pelvis W Contrast  Result Date: 12/08/2021 CLINICAL DATA:  Pulmonary embolism suspected. Longstanding diabetes mellitus with end-stage renal disease on peritoneal dialysis. Complicated by peritoneal dialysis catheter failure and peritenonitis. Abdominal pain EXAM: CT ANGIOGRAPHY CHEST CT ABDOMEN AND PELVIS WITH CONTRAST TECHNIQUE: Multidetector CT imaging of the chest was performed using the standard protocol during bolus administration of intravenous contrast. Multiplanar CT image reconstructions and MIPs were obtained to evaluate the vascular anatomy. Multidetector CT imaging of the abdomen and pelvis was performed using the standard protocol during bolus administration of intravenous contrast. RADIATION DOSE REDUCTION: This exam was performed according to the departmental dose-optimization program which includes automated exposure control, adjustment of the mA and/or kV according to patient size and/or use of iterative reconstruction technique. CONTRAST:  769mOMNIPAQUE IOHEXOL 350 MG/ML SOLN COMPARISON:  None Available. FINDINGS: CTA CHEST FINDINGS Cardiovascular: Satisfactory opacification of the pulmonary arteries to the segmental level. No evidence of pulmonary embolism. Normal heart size. No pericardial effusion. Mediastinum/Nodes: No enlarged mediastinal, hilar, or axillary lymph nodes. Endotracheal tube and feeding tube in place. Thyroid gland, trachea, and esophagus demonstrate no significant findings. Lungs/Pleura: Large bilateral pleural effusions with dependent atelectasis of the bilateral lower lobes. No evidence of  pneumonia. Smooth interlobular septal thickening concerning  for pulmonary edema. Musculoskeletal: Moderate generalized anasarca. No acute or significant osseous findings. Review of the MIP images confirms the above findings. CT ABDOMEN and PELVIS FINDINGS Hepatobiliary: No focal liver abnormality is seen. No gallstones, gallbladder wall thickening, or biliary dilatation. Pancreas: Mild generalized pancreatic atrophy. No pancreatic ductal dilatation or surrounding inflammatory changes. Spleen: Normal in size without focal abnormality. Adrenals/Urinary Tract: Adrenal glands are unremarkable. Bilateral renal cortical atrophy. No evidence of nephrolithiasis or hydronephrosis. Bladder is contracted about Foley's catheter. Stomach/Bowel: Stomach is normal with NG tube in the body of the stomach. Small bowel loops are normal in caliber. There are fluid-filled colonic loops concerning for ileus. No bowel wall thickening. Vascular/Lymphatic: Moderate aortic atherosclerosis of infrarenal abdominal aorta and branch vessels. No enlarged abdominal or pelvic lymph nodes. Reproductive: Status post hysterectomy. No adnexal masses. Left adnexal cyst measuring up to 1 point 4 cm. Other: Small pelvic ascites. Moderate generalized anasarca of the abdominal wall. Small fat containing umbilical hernia. Musculoskeletal: No fracture is seen. Review of the MIP images confirms the above findings. IMPRESSION: 1. No evidence of pulmonary embolism. 2. Large bilateral pleural effusions with dependent atelectasis of the bilateral lower lobes. 3. Smooth interlobular septal thickening concerning for pulmonary edema. 4. Moderate generalized anasarca of the chest  and abdominal wall. 5. Fluid-filled colonic loops concerning for ileus. No evidence of bowel obstruction. 6. Small pelvic ascites. 7. Bilateral renal cortical atrophy. 8. Aortic Atherosclerosis (ICD10-I70.0). Electronically Signed   By: Keane Police D.O.   On: 12/21/2021 17:08   CT Head  Wo Contrast  Result Date: 12/16/2021 CLINICAL DATA:  Mental status change, unknown cause EXAM: CT HEAD WITHOUT CONTRAST TECHNIQUE: Contiguous axial images were obtained from the base of the skull through the vertex without intravenous contrast. RADIATION DOSE REDUCTION: This exam was performed according to the departmental dose-optimization program which includes automated exposure control, adjustment of the mA and/or kV according to patient size and/or use of iterative reconstruction technique. COMPARISON:  Head CT 11/20/2021. FINDINGS: Brain: No evidence of acute intracranial hemorrhage or extra-axial collection. No loss of gray-white matter differentiation.No evidence of mass lesion/concerning mass effect.The ventricles are normal in size. Vascular: No hyperdense vessel or unexpected calcification. Skull: Normal. Negative for fracture or focal lesion. Sinuses/Orbits: Mild paranasal sinus mucosal thickening worst in the ethmoid air cells. Other: None. IMPRESSION: No acute intracranial abnormality. Electronically Signed   By: Maurine Simmering M.D.   On: 01/06/2022 16:40   DG Abd Portable 1 View  Result Date: 12/26/2021 CLINICAL DATA:  NG placement EXAM: PORTABLE ABDOMEN - 1 VIEW COMPARISON:  11/20/2021 FINDINGS: NG tube enters the stomach with the tip in the mid body of the stomach. No dilated bowel loops identified. IMPRESSION: NG tip in the body the stomach. Electronically Signed   By: Franchot Gallo M.D.   On: 12/09/2021 16:03   DG Chest Portable 1 View  Result Date: 12/31/2021 CLINICAL DATA:  Intubation EXAM: PORTABLE CHEST 1 VIEW COMPARISON:  Chest 12/18/2021 FINDINGS: Endotracheal tube in good position. NG tube in the stomach. Left jugular dual lumen central venous catheter with tips in the right atrium unchanged. Diffuse bilateral airspace disease has developed since the prior study. Probable edema. Small bilateral effusions. Mild bibasilar atelectasis IMPRESSION: Endotracheal tube in good position.  Diffuse bilateral airspace disease has developed most likely pulmonary edema. Small pleural effusions. Electronically Signed   By: Franchot Gallo M.D.   On: 01/07/2022 16:01    Cardiac Studies  TTE 12/23/21 1. Left ventricular ejection fraction, by  estimation, is 25 to 30%. The  left ventricle has severely decreased function. The left ventricle  demonstrates regional wall motion abnormalities (see scoring  diagram/findings for description). The left  ventricular internal cavity size was mildly dilated. Left ventricular  diastolic parameters are consistent with Grade I diastolic dysfunction  (impaired relaxation). There is global hypokinesis with relative sparing  of the basal segments.   2. Right ventricular systolic function is low normal. The right  ventricular size is normal. Tricuspid regurgitation signal is inadequate  for assessing PA pressure.   3. A small pericardial effusion is present. The pericardial effusion is  circumferential. There is no evidence of cardiac tamponade.   4. No evidence of mitral valve regurgitation. No evidence of mitral  stenosis.   5. There is mild calcification of the aortic valve. There is moderate  thickening of the aortic valve.  R/LHC (11/28/2021): Diffuse multivessel disease with very small caliber vessels no obvious culprit lesion: Notable lesions include: 90% and 80% RV marginal branches, 90% PLV 1, 65% LAD with 90% D1, 60% LCx-OM was with diffuse 40 and 60% mid to distal LAD LAD => small caliber vessels make CABG likely to be unfavorable, but will discuss with [facility] Moderate to severe pulmonary hypertension with mean PAP 43 mmHg  with PCWP 34 mmHg.  LVP-EDP 162/12 mmHg - 40 mmHg, and AoP-MAP of 160/94-121 mmHg Mild to moderate reduction in Cardiac Output-Index by Thermodilution (4.13-2.23) with mild by Fick (5.07-2.74)   TTE (11/23/2021):  1. Left ventricular ejection fraction, by estimation, is 40 to 45%. The  left ventricle has mildly  decreased function. The left ventricle  demonstrates global hypokinesis. Left ventricular diastolic parameters are  indeterminate. Elevated left ventricular  end-diastolic pressure.   2. Right ventricular systolic function is normal. The right ventricular  size is normal.   3. Large pleural effusion in the left lateral region.   4. The mitral valve is normal in structure. Trivial mitral valve  regurgitation. No evidence of mitral stenosis.   5. The aortic valve is tricuspid. There is moderate calcification of the  aortic valve. Aortic valve regurgitation is not visualized. Aortic valve  sclerosis/calcification is present, without any evidence of aortic  stenosis. Aortic valve area, by VTI  measures 2.39 cm. Aortic valve mean gradient measures 3.0 mmHg. Aortic  valve Vmax measures 1.23 m/s.   6. The inferior vena cava is dilated in size with <50% respiratory  variability, suggesting right atrial pressure of 15 mmHg.   Patient Profile     42 y.o. female with a history of longstanding diabetes complicated by end-stage renal disease who was previously on PD complicated by catheter failure and peritonitis requiring HD, severe multivessel coronary artery disease, ischemic cardiomyopathy with acute recent heart failure, type 1 diabetes, chronic pain syndrome, prior opioid addiction on chronic methadone complicated by QT prolongation, who is being seen and evaluated s/p cardiac arrest with possible STEMI.  Assessment & Plan    S/p Cardiac arrest -patient with sudden onset of shortness of breath while at hemodialysis with PEA arrest -remained tachycardiac post ROSC -CTA of the chest negative for PE, reveals large bilateral pleural effusions -limited echocardiogram completed reveals LVEF 25-30%, G1DD, small pericardial effusion without tamponade -recommend fluid removal with CVVH or HD -vent management per CCM -remains intubated and sedated -Neurology following, concerns for unequal pupil size  and myoclonic seizure like movements when off sedation, propofol placed on hold this morning for neuro examination -continuous EEG at the bedside noted  to have minimal activity during examination  -palliative care consult for goals of care  Coronary artery disease -Hs troponins trended 63, 211, 241, and 177; trended flat -no heparin drip ordered at this time, not a significant increase of Hs troponins -continue asa and statin therapies -restart amlodipine and carvedilol as blood pressure allows -continue with cardiac monitoring   Chronic HFrEF -LVEF 25-30% s/p cardiac arrest on limited echocardiogram -restart GDMT as blood pressure allows -currently blood pressure is soft not on pressors yet -volume up on exam -daily weight, I&O -will likely need daily HD or CVVH to remove accumulated excess fluid, CTA of the chest revealed large bilateral pleural effusions, pulmonary edema, moderate generalized anasarca of the chest and abd wall  Abnormal EKG with QT prolongation -avoid QT prolongation medications including antiemetics -monitor/trend/replete electrolytes as needed -recommend keeping K+ 4 and Mg 2, current K+ 3.4 and Mg 1.9 -continue cardiac monitoring  Type 1 diabetes -management per primary team      For questions or updates, please contact [place] Please consult [email or web address] for contact info under        Signed, Manasvi Dickard, NP  2022-01-06, 7:38 AM

## 2022-01-08 NOTE — Procedures (Signed)
Patient Name: Nichole Wiley  MRN: [id number]  Epilepsy Attending: Lora Havens  Referring Physician/Provider: Rust-Chester, Huel Cote, NP  Date: 12-28-21 Duration: 53.13 mins  Patient history: 41 year old female status post cardiac arrest.  EEG to evaluate for seizure.  Level of alertness:  comatose  AEDs during EEG study: Propofol, Versed, Keppra  Technical aspects: This EEG study was done with scalp electrodes positioned according to the 10-20 International system of electrode placement. Electrical activity was reviewed with band pass filter of 1-'70Hz'$ , sensitivity of 7 uV/mm, display speed of 46m/sec with a '60Hz'$  notched filter applied as appropriate. EEG data were recorded continuously and digitally stored.  Video monitoring was available and reviewed as appropriate.  Description: At the end of the study, patient was noted to have near continuous jerking in bilateral upper and lower extremities as well as neck twitching. After rocuronium was administered, clinical twitching stopped. EEG showed generalized periodic epileptiform discharges at 3 to 4 Hz consistent with myoclonic status epilepticus. In between seizures, EEG showed background suppression. Hyperventilation and photic stimulation were not performed.     ABNORMALITY - Myoclonic status epilepticus, generalized - Background suppression, generalized  IMPRESSION: This study showed evidence of myoclonic status epilepticus as well as profound diffuse encephalopathy.  In the setting of cardiac arrest, this is most likely suggestive of anoxic/hypoxic brain injury with poor prognosis.    Dr. ARory Percywas notified.  Bridgid Printz OBarbra Sarks

## 2022-01-08 NOTE — IPAL (Signed)
  Interdisciplinary Goals of Care Family Meeting   Date carried out: Dec 28, 2021  Location of the meeting: Bedside  Member's involved: Nurse Practitioner, Bedside Registered Nurse, and Family Member or next of kin  Durable Power of Attorney or acting medical decision maker: Pts mother Clarisse Gouge and Daughter Jeananne Rama  Discussion: We discussed goals of care for [employer] . They have decided to transition pt to Comfort Measures Only   Code status: Full DNR  Disposition: In-patient comfort care  Time spent for the meeting: 15 minutes   Donell Beers, [place] Pager [phone] (please enter 7 digits) PCCM Consult Pager [phone] (please enter 7 digits)

## 2022-01-08 NOTE — Progress Notes (Signed)
An USGPIV (ultrasound guided PIV) has been placed for short-term vasopressor infusion. A correctly placed ivWatch must be used when administering Vasopressors. Should this treatment be needed beyond 72 hours, central line access should be obtained.  It will be the responsibility of the bedside nurse to follow best practice to prevent extravasations.   ?

## 2022-01-08 NOTE — Progress Notes (Signed)
Patient passed at 18:45.  Daughter and mother at bedside.

## 2022-01-08 NOTE — Consult Note (Signed)
Neurology Consultation  Reason for Consult: Postcardiac arrest seizures Referring Physician: Dr. Mortimer Fries, critical care  CC: Postcardiac arrest seizure-like activity, status epilepticus, myoclonus  History is obtained from: Chart review  HPI: CHIYO FAY is a 41 y.o. 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  She was at hemodialysis earlier yesterday when she suddenly became short of breath and hypoxic about three fourth of the way through her dialysis treatment, EMS was summoned and found her to be severely hypoxic with oxygen saturation in the 70s.  She had a sudden cardiopulmonary arrest that required 5 rounds of epinephrine, 1 dose of sodium bicarbonate and approximately 20 minutes of CPR before ROSC was achieved.  Code STEMI was also called.  Based on chart review, last month also had a cardiac arrest in the setting of significant electrolyte derangements from a malfunctioning peritoneal dialysis catheter and acute peritonitis.  Had polymorphic VT.  Echo showed moderately reduced LVEF.  Right and left heart catheterization showed moderate to severe three-vessel disease not well suited to PCI as well as moderately severely elevated left and right heart filling pressures.  Patient was emergently intubated in the ER, started on propofol.  As soon as propofol was lowered, she started exhibiting whole body  jerking movements for which propofol was increased and she was Given IV pushes of Versed but rhythmic jerking did not stop.  She was given 1 g of Keppra load and hooked up to rapid EEG which showed concern for seizure activity.  She was given additional 500 mg of Keppra to bring the loading dose closer to 20 mg/kg, was given an additional load of valproate 20 mg/kg and Versed drip was started.  Currently she is on propofol at the rate of 80, Versed at 15 mg/h.  She is also on Keppra to 50 mg twice daily.  Neurology consultation was obtained this morning for further recommendations     ROS: Unable to obtain due to altered mental status.   Past Medical History:  Diagnosis Date   Abdominal mass    Arthritis    back   Chronic kidney disease    Colon polyp    Coronary artery disease 11/2021   Diabetes 1.5, managed as type 1 (HCC)    Drug addiction (HCC)    hx (pain pills after c-sect)   GERD (gastroesophageal reflux disease)    Heart failure with mildly reduced ejection fraction (HFmrEF) ([facility]) 11/2021   Hypertension    Pelvic floor dysfunction    Urine incontinence      Family History  Problem Relation Age of Onset   Depression Mother    Diabetes Father    Heart disease Maternal Grandmother    Depression Maternal Grandmother    Dementia Maternal Grandmother    Diabetes Maternal Grandmother    Diabetes Paternal Grandmother      Social History:   reports that she has been smoking cigarettes. She has a 6.00 pack-year smoking history. She has never used smokeless tobacco. She reports that she does  not drink alcohol and does not use drugs.  Medications  Current Facility-Administered Medications:    0.9 %  sodium chloride infusion, 250 mL, Intravenous, PRN, Mortimer Fries, Kurian, MD   0.9 %  sodium chloride infusion, 250 mL, Intravenous, Continuous, Rust-Chester, Toribio Harbour L, NP   acetaminophen (TYLENOL) tablet 650 mg, 650 mg, Per Tube, Q4H PRN, Flora Lipps, MD   Chlorhexidine Gluconate  Cloth 2 % PADS 6 each, 6 each, Topical, Q0600, Flora Lipps, MD, 6 each at 2022-01-09 0542   dextrose 10 % infusion, , Intravenous, Continuous, Rust-Chester, Huel Cote, NP, Last Rate: 20 mL/hr at 2022-01-09 0605, Rate Change at 01-09-22 0605   docusate (COLACE) 50 MG/5ML liquid 100 mg, 100 mg, Per Tube, BID PRN, Flora Lipps, MD   docusate (COLACE) 50 MG/5ML liquid 100 mg, 100 mg, Per Tube, BID, Rust-Chester, Toribio Harbour L, NP   fentaNYL 2521mg in NS 2550m(1045mml) infusion-PREMIX, 25-200 mcg/hr, Intravenous, Continuous, Rust-Chester, Britton L, NP, Last Rate: 10 mL/hr at 12/2021/01/199, 100 mcg/hr at 12/18/00/202300   heparin injection 5,000 Units, 5,000 Units, Subcutaneous, Q8H, Kasa, KurMaretta BeesD, 5,000 Units at 10/11-04-2342   levETIRAcetam (KEPPRA) 250 mg in sodium chloride 0.9 % 100 mL IVPB, 250 mg, Intravenous, Q12H, Rust-Chester, BriToribio Harbour NP   midazolam (VERSED) 100 mg/100 mL (1 mg/mL) premix infusion, 15 mg/hr, Intravenous, Continuous, Rust-Chester, Britton L, NP, Last Rate: 15 mL/hr at 10/11-02-202339, 15 mg/hr at 12/18/00/2339   midazolam (VERSED) bolus via infusion 2-4 mg, 2-4 mg, Intravenous, Q1H PRN, Rust-Chester, BriToribio Harbour NP   norepinephrine (LEVOPHED) '4mg'$  in 250m85m.016 mg/mL) premix infusion, 2-10 mcg/min, Intravenous, Titrated, Rust-Chester, BritToribio HarbourNP   Oral care mouth rinse, 15 mL, Mouth Rinse, Q2H, Kasa, Kurian, MD, 15 mL at 10/1November 02, 20236   Oral care mouth rinse, 15 mL, Mouth Rinse, PRN, KasaFlora Lipps   pantoprazole (PROTONIX) injection 40 mg, 40 mg, Intravenous, QHS, Kasa, Kurian, MD, 40 mg at 12/29/2021 2114   polyethylene glycol (MIRALAX / GLYCOLAX) packet 17 g, 17 g, Per Tube, Daily PRN, KasaFlora Lipps   polyethylene glycol (MIRALAX / GLYCOLAX) packet 17 g, 17 g, Per Tube, Daily, Rust-Chester, BritToribio HarbourNP   propofol (DIPRIVAN) 1000 MG/100ML infusion, 5-80 mcg/kg/min, Intravenous, Continuous, WongLucillie Garfinkel, Last Rate: 38.3 mL/hr at 10/111-02-20231, 80 mcg/kg/min at 10/102-Nov-202341   sodium chloride flush (NS) 0.9 % injection 3 mL, 3 mL, Intravenous, Q12H, Kasa, Kurian, MD, 3 mL at 12/30/2021 2111   sodium chloride flush (NS) 0.9 % injection 3 mL, 3 mL, Intravenous, PRN, KasaFlora Lipps  Exam: Current vital signs: BP 92/65   Pulse 91   Temp 98.6 F (37 C)   Resp 18   Ht '5\' 7"'$  (1.702 m)   Wt 80.2 kg   SpO2 99%   BMI 27.69 kg/m  Vital signs in last 24 hours: Temp:  [97.1 F (36.2 C)-98.6 F (37 C)] 98.6 F (37 C) (10/18 0600) Pulse Rate:  [78-120] 91 (10/18 0600) Resp:  [15-21] 18 (10/18 0600) BP: (92-191)/(65-129) 92/65 (10/18 0600) SpO2:  [91 %-100 %] 99 % (10/18 0751) FiO2 (%):  [30 %] 30 % (10/18 0751) Weight:  [80.2 kg-80.8 kg] 80.2 kg (10/18 0352) General: Intubated, sedated on propofol and Versed.  Propofol was held for exam. HEENT: Normocephalic atraumatic CVS: Regular rhythm Respiratory: Vented Abdomen nondistended nontender Neurological exam Sedated intubated Propofol held for exam but continues to be on Versed Upon holding propofol for 5 minutes or so started having whole  body twitching which was mild with rapid EEG not showing the characteristic myoclonic spikes-looks more but suppressed with long suppressions. Her pupils are mildly asymmetric with the right pupil 2 mm nonreactive and left pupil 3 mm nonreactive. Corneal reflexes are absent She is breathing over the vent off-and-on by 1 or so breaths per minute. No spontaneous movement of the extremities other than the twitching described above No withdrawal to noxious stimulation.  Labs I have reviewed labs in epic and the results pertinent to this consultation are: CBC    Component Value Date/Time   WBC 8.9 01-24-2022 0047   RBC 3.57 (L) 24-Jan-2022 0047   HGB 10.2 (L) Jan 24, 2022 0047   HGB 12.8 01/20/2012 1610   HCT 32.7 (L) 01-24-22 0047   HCT 36.8 01/20/2012 1610   PLT 182 01/24/2022 0047   PLT 95 (L) 01/20/2012 1610   MCV 91.6 01-24-2022 0047   MCV 89 01/20/2012 1610    MCH 28.6 January 24, 2022 0047   MCHC 31.2 2022-01-24 0047   RDW 16.4 (H) 2022/01/24 0047   RDW 12.6 01/20/2012 1610   LYMPHSABS 2.2 12/23/2021 1533   MONOABS 0.4 12/15/2021 1533   EOSABS 0.3 12/26/2021 1533   BASOSABS 0.1 12/09/2021 1533    CMP     Component Value Date/Time   NA 142 2022-01-24 0047   NA 141 January 24, 2022 0047   NA 127 (L) 01/20/2012 1610   K 3.4 (L) 01/24/22 0047   K 3.4 (L) 2022-01-24 0047   K 3.5 01/20/2012 1610   CL 107 2022/01/24 0047   CL 106 Jan 24, 2022 0047   CL 91 (L) 01/20/2012 1610   CO2 24 2022-01-24 0047   CO2 24 01-24-2022 0047   CO2 26 01/20/2012 1610   GLUCOSE 96 01-24-2022 0047   GLUCOSE 98 01/24/2022 0047   GLUCOSE 560 (HH) 01/20/2012 1610   BUN 19 Jan 24, 2022 0047   BUN 20 January 24, 2022 0047   BUN 10 01/20/2012 1610   CREATININE 5.29 (H) 01-24-22 0047   CREATININE 5.22 (H) 2022/01/24 0047   CREATININE 17.25 (H) 10/29/2021 1547   CALCIUM 8.3 (L) 01-24-2022 0047   CALCIUM 8.5 (L) January 24, 2022 0047   CALCIUM 8.7 01/20/2012 1610   PROT 5.2 (L) 2022-01-24 0047   PROT 7.5 01/20/2012 1610   ALBUMIN 2.6 (L) 2022/01/24 0047   ALBUMIN 2.5 (L) 01/24/2022 0047   ALBUMIN 2.9 (L) 01/20/2012 1610   AST 34 Jan 24, 2022 0047   AST 23 01/20/2012 1610   ALT 24 01/24/22 0047   ALT 43 01/20/2012 1610   ALKPHOS 150 (H) 24-Jan-2022 0047   ALKPHOS 235 (H) 01/20/2012 1610   BILITOT 0.7 Jan 24, 2022 0047   BILITOT 0.9 01/20/2012 1610   GFRNONAA 10 (L) 01/24/22 0047   GFRNONAA 10 (L) 01-24-2022 0047   GFRNONAA 71 09/28/2015 1524   GFRAA 35 (L) 11/23/2018 1719   GFRAA 82 09/28/2015 1524   Imaging I have reviewed the images obtained:  CT-head: No acute intracranial abnormality noted.  Assessment:  41 year old woman past history of diabetes, ESRD on hemodialysis now after complications with PD including peritonitis which required transition to HD, hypertension, opiate dependence, ischemic cardiomyopathy, multivessel coronary artery disease, acute heart  failure with midrange ejection fraction amongst other comorbidities brought in after cardiac arrest that happened while at dialysis with a 20-minute downtime and multiple rounds of epi prior to ROSC. She started exhibiting whole body twitching movements concerning for myoclonus for which she was loaded with Keppra and Depakote, started on Keppra standing doses and sedated  with propofol at 80 mcg/kg/min and Versed 15 mg/h. On holding sedation with propofol, I was not able to elicit pupillary responses or corneal reflexes.  She is breathing over the ventilator at this time.  No other purposeful movement was elicitable. Spoke with the daughter over the phone-her uremic myoclonic movements from July had subsided after correction of her renal function. I suspect that the myoclonic looking movements are likely related to anoxic brain injury.  Usually myoclonic jerking right after cardiac arrest portends poor prognosis in terms of neurologically meaningful recovery but given her young age and the current guidelines, we will give her  Impression: Postcardiac arrest myoclonic, myoclonic status epilepticus Evaluate for anoxic brain injury  Recommendations: Continue current sedation and Keppra Obtain routine EEG this morning.  We are going to try to stop propofol while she is getting the EEG.  We do not have the means to do long-term EEG monitoring but we will try to do a longer EEG than the routine 20 minutes to see if there is any indication for LTM EEG for which she would need transfer to the tertiary care center. If the myoclonic jerks continue with reduced sedation, may need to put her on standing doses of Depakote. We will check a Depakote level for this morning.  For now not on standing doses of Depakote. Other options would be adding Klonopin. Too early to prognosticate but as above, polymyoclonus usually portends poor chances of meaningful recovery from a neurological standpoint. Spoke with the  daughter and updated her on her mother's condition and my assessment as well. We will follow  Plan discussed with Donell Beers and Dr. Rich Number, in the ICU.   -- Amie Portland, MD Neurologist [facility] Pager: [phone]  CRITICAL CARE ATTESTATION Performed by: Amie Portland, MD Total critical care time: 44 minutes Critical care time was exclusive of separately billable procedures and treating other patients and/or supervising APPs/Residents/Students Critical care was necessary to treat or prevent imminent or life-threatening deterioration due to myoclonic status epilepticus, postcardiac anoxic brain injury This patient is critically ill and at significant risk for neurological worsening and/or death and care requires constant monitoring. Critical care was time spent personally by me on the following activities: development of treatment plan with patient and/or surrogate as well as nursing, discussions with consultants, evaluation of patient's response to treatment, examination of patient, obtaining history from patient or surrogate, ordering and performing treatments and interventions, ordering and review of laboratory studies, ordering and review of radiographic studies, pulse oximetry, re-evaluation of patient's condition, participation in multidisciplinary rounds and medical decision making of high complexity in the care of this patient.

## 2022-01-08 NOTE — Progress Notes (Signed)
Initial Nutrition Assessment  DOCUMENTATION CODES:   Not applicable  INTERVENTION:   -Initiate TF via OGT:   Initiate Osmolite 1.5 @ 20 ml/hr and increase by 10 ml every 4 hours to goal rate of 30 ml/hr.   60 ml Prosource TF BID.   20 ml free water flush every 4 hours to maintain tube patency   Tube feeding regimen provides 1400 kcal, 125 grams of protein, and 549 ml of H2O. Total free water: 669 ml  TF + Propofol to provide 2034 kcals daily  NUTRITION DIAGNOSIS:   Inadequate oral intake related to inability to eat as evidenced by NPO status.  GOAL:   Patient will meet greater than or equal to 90% of their needs  MONITOR:   Vent status, TF tolerance  REASON FOR ASSESSMENT:   Ventilator    ASSESSMENT:   41 yo white female with multiple comorbid conditions DM and  with 3VD severe CAD with acute and severe hypoxic resp failure leading to acute and sudden cardiac arrest  with severe multiorgan failure, at this time findings are concerning for brain damage  Patient is currently intubated on ventilator support MV: 11.5 L/min Temp (24hrs), Avg:98.1 F (36.7 C), Min:97.1 F (36.2 C), Max:98.8 F (37.1 C)  Propofol: 24 ml/hr (provides 634 kcals daily)  Case discussed with MD, RN, and during ICU rounds. Pt experienced cardiac arrest after HD yesterday. Pt still having seizures ad there is concern for brain damage. Per family wishes, if EEG is abnormal, they are requesting transfer to [facility].   Per MD pt with poor prognosis. Plan to start TF today. Palliative care consult pending.   Reviewed wt hx; pt has experienced a 5.4% wt loss over the past 3 months, which is not significant for time frame. Unsure of EDW. Suspect edema may also be masking further weight loss as well as fat and muscle depletions.   Medications reviewed and include colace, miralax, keppra, versed,  dextrose 10% infusion @ 20 ml/hr.   Labs reviewed: K: 3.4, CBGS: 67-86 (inpatient orders for  glycemic control are none).    NUTRITION - FOCUSED PHYSICAL EXAM:  Flowsheet Row Most Recent Value  Orbital Region No depletion  Upper Arm Region No depletion  Thoracic and Lumbar Region No depletion  Buccal Region No depletion  Temple Region No depletion  Clavicle Bone Region No depletion  Clavicle and Acromion Bone Region No depletion  Scapular Bone Region No depletion  Dorsal Hand No depletion  Patellar Region No depletion  Anterior Thigh Region No depletion  Posterior Calf Region No depletion  Edema (RD Assessment) Mild  Hair Reviewed  Eyes Reviewed  Mouth Reviewed  Skin Reviewed  Nails Reviewed       Diet Order:   Diet Order             Diet NPO time specified  Diet effective now                   EDUCATION NEEDS:   Not appropriate for education at this time  Skin:  Skin Assessment: Reviewed RN Assessment  Last BM:  12/09/2021  Height:   Ht Readings from Last 1 Encounters:  12/10/2021 '5\' 7"'$  (1.702 m)    Weight:   Wt Readings from Last 1 Encounters:  2022/01/17 80.2 kg    Ideal Body Weight:  61.4 kg  BMI:  Body mass index is 27.69 kg/m.  Estimated Nutritional Needs:   Kcal:  1773  Protein:  > 125  grams  Fluid:  1000 ml + UOP    Loistine Chance, RD, LDN, [place] Registered Dietitian II Certified Diabetes Care and Education Specialist Please refer to [facility] for RD and/or RD on-call/weekend/after hours pager

## 2022-01-08 NOTE — Progress Notes (Signed)
Prolonged eeg done

## 2022-01-08 NOTE — Progress Notes (Signed)
Pt transported to and from MRI with no complications.

## 2022-01-08 NOTE — IPAL (Signed)
  Interdisciplinary Goals of Care Family Meeting   Date carried out: 01/01/22  Location of the meeting: Phone conference  Member's involved: Physician and Family Member or next of kin     GOALS OF CARE DISCUSSION  The Clinical status was relayed to family in detail-  Updated and notified of patients medical condition- Patient remains unresponsive and will not open eyes to command.   Patient is having a weak cough and struggling to remove secretions.   Patient with increased WOB and using accessory muscles to breathe Explained to family course of therapy and the modalities   Patient with Progressive multiorgan failure with a very high probablity of a very minimal chance of meaningful recovery despite all aggressive and optimal medical therapy.   +Severe Brain damage All organs failing Patient is suffering   Mother understands the situation.  She has  consented and agreed to DNR  status  Family are satisfied with Plan of action and management. All questions answered  Additional CC time 32 mins   Jaslynn Thome Patricia Pesa, M.D.  [facility]  Medical Director [place] Director [facility] Cardio-Pulmonary Department

## 2022-01-08 NOTE — Death Summary Note (Signed)
DEATH SUMMARY   Patient Details  Name: Nichole Wiley MRN: [id number] DOB: Sep 22, 1980  Admission/Discharge Information   Admit Date:  2021/12/30  Date of Death:  12/31/21   Time of Death:  1843-05-28  Length of Stay: 1  Referring Physician: Care, [name] Primary   Reason(s) for Hospitalization  CARDIAC ARREST  Diagnoses  Preliminary cause of death: ISCHEMIC CARDIOMYOPATHY Secondary Diagnoses (including complications and co-morbidities):  Principal Problem:   Cardiac arrest [facility]) Active Problems:   Seizure [facility])   Brief Hospital Course (including significant findings, care, treatment, and services provided and events leading to death)     CHIEF COMPLAINT:  acute cardiac arrest   History of Present Illness/SYNOPSIS    41 y.o. female with a hx of  diabetes mellitus complicated by end-stage renal disease previously on peritoneal dialysis complicated by PD catheter failure and peritonitis requiring transition to hemodialysis,    severe multivessel coronary artery disease, ischemic cardiomyopathy with recent acute heart failure with mid range ejection fraction, type 1 diabetes mellitus, chronic pain, and prior opioid addiction on chronic methadone complicated by QT prolongation, who is being seen 2021/12/30 for the evaluation of cardiac arrest PCCM ASKED TO ADMIT     Patient is currently intubated and unresponsive. At hemodialysis earlier today when she suddenly became short of breath and hypoxic about 3/4 of the way through her dialysis treatment.     EMS was summoned and found her to be severely hypoxic with oxygen saturations in the 70s.     She suddenly had cardiopulmonary arrest that required 5 rounds of epinephrine, 1 dose of sodium bicarbonate, and approximately 20 minutes of CPR before ROSC was achieved.  CODE STEMI CALLED     PREVIOUS HOSPITALIZATION Last month suffered cardiac arrest in the setting of significant electrolyte derangements from a malfunctioning  peritoneal dialysis catheter and acute peritonitis.  +polymorphic VT.  Echo showed moderately reduced LVEF.   right and left heart catheterization that showed moderate to severe three-vessel disease not well suited to PCI as well as moderately-severely elevated left and right heart filling pressures.     Patient now intubated, weaning of sedation Showing signs of severe brain damage based on physical examination Absent COUGH/GAG/CORNEAL REFLEXES and [facility] Events: Including procedures, antibiotic start and stop dates in addition to other pertinent events   12-31-2022 admitted for acute cardiac arrest ?etiology signs of brain damage 2023-01-01 +seizures +brain damage, multiorgan failure         Micro Data:  COVID PENDING   Antimicrobials:    Antibiotics Given (last 72 hours)       Date/Time Action Medication Dose Rate    Dec 30, 2021 1643 New Bag/Given   ceFEPIme (MAXIPIME) 2 g in sodium chloride 0.9 % 100 mL IVPB 2 g 200 mL/hr    12-30-21 1648 New Bag/Given   vancomycin (VANCOREADY) IVPB 1750 mg/350 mL 1,750 mg 175 mL/hr                     Objective   Blood pressure 92/65, pulse 91, temperature 98.6 F (37 C), resp. rate 18, height '5\' 7"'$  (1.702 m), weight 80.2 kg, SpO2 99 %.     >  Vent Mode: PRVC FiO2 (%):  [30 %] 30 % Set Rate:  [18 bmp] 18 bmp Vt Set:  [400 mL-490 mL] 490 mL PEEP:  [5 cmH20] 5 cmH20 Plateau Pressure:  [18 cmH20] 18 cmH20      Intake/Output  Summary (Last 24 hours) at 01/15/22 0744 Last data filed at 01/15/22 0400    Gross per 24 hour  Intake 686.21 ml  Output 565 ml  Net 121.21 ml         Filed Weights    12/11/2021 1548 12/15/2021 2145 01-15-2022 0352  Weight: 80.8 kg 80.8 kg 80.2 kg        REVIEW OF SYSTEMS   PATIENT IS UNABLE TO PROVIDE COMPLETE REVIEW OF SYSTEMS DUE TO SEVERE CRITICAL ILLNESS    PHYSICAL EXAMINATION:   GENERAL:critically ill appearing, +resp distress EYES: Pupils equal, round,  reactive to light.  No scleral icterus.  MOUTH: Moist mucosal membrane. INTUBATED NECK: Supple.  PULMONARY: Lungs clear to auscultation, +rhonchi, +wheezing CARDIOVASCULAR: S1 and S2.  Regular rate and rhythm GASTROINTESTINAL: Soft, nontender, -distended. Positive bowel sounds.  MUSCULOSKELETAL: No swelling, clubbing, or edema.  NEUROLOGIC: obtunded,sedated SKIN:normal, warm to touch, Capillary refill delayed  Pulses present bilaterally     Labs/imaging that I havepersonally reviewed  (right click and "Reselect all SmartList Selections" daily)        ASSESSMENT AND PLAN SYNOPSIS   41 yo white female with multiple comorbid conditions DM and  with 3VD severe CAD with acute and severe hypoxic resp failure leading to acute and sudden cardiac arrest  with severe multiorgan failure, at this time findings are concerning for brain damage   Severe ACUTE Hypoxic and Hypercapnic Respiratory Failure -continue Mechanical Ventilator support -Wean Fio2 and PEEP as tolerated -VAP/VENT bundle implementation - Wean PEEP & FiO2 as tolerated, maintain SpO2 > 88% - Head of bed elevated 30 degrees, VAP protocol in place - Plateau pressures less than 30 cm H20  - Intermittent chest x-ray & ABG PRN - Ensure adequate pulmonary hygiene     >  Vent Mode: PRVC FiO2 (%):  [30 %] 30 % Set Rate:  [18 bmp] 18 bmp Vt Set:  [400 mL-490 mL] 490 mL PEEP:  [5 cmH20] 5 cmH20 Plateau Pressure:  [18 cmH20] 18 cmH20       NEUROLOGY ACUTE METABOLIC ENCEPHALOPATHY STATUS EPILEPTICUS Follow up neurology recs On ceribell, full EEG to follow Plan for TTM with normothermia protocol     CARDIAC FAILURE-acute combined systolic/diastolic dysfunction Follow up cardiology recs Last echo 11/2021  Left ventricular ejection fraction, by estimation, is 40 to 45%. The  left ventricle has mildly decreased function. The left ventricle  demonstrates global hypokinesis. Left ventricular diastolic parameters are  indeterminate. Elevated left ventricular end-diastolic pressure.  High risk for recurrent cardiac arrest vent support Pressors as needed   CARDIAC ICU monitoring   ESRD ON HD -continue Foley Catheter-assess need -Avoid nephrotoxic agents -Follow urine output, BMP -Ensure adequate renal perfusion, optimize oxygenation -Renal dose medications NO HD FOR NOW-WILL INDUCE CARDIAC ARREST AT THIS TIME     Intake/Output Summary (Last 24 hours) at January 15, 2022 0751 Last data filed at 2022/01/15 0400    Gross per 24 hour  Intake 686.21 ml  Output 565 ml  Net 121.21 ml        INFECTIOUS DISEASE -continue antibiotics as prescribed -follow up cultures     ENDO - ICU hypoglycemic\Hyperglycemia protocol -check FSBS per protocol     GI GI PROPHYLAXIS as indicated   NUTRITIONAL STATUS DIET-->TF's as tolerated Constipation protocol as indicated     ELECTROLYTES -follow labs as needed -replace as needed -pharmacy consultation and following   ACUTE ANEMIA- TRANSFUSE AS NEEDED CONSIDER TRANSFUSION  IF HGB<7  GOALS OF CARE DISCUSSION   The Clinical status was relayed to family in detail-   Updated and notified of patients medical condition- Patient remains unresponsive and will not open eyes to command.   Patient is having a weak cough and struggling to remove secretions.   Patient with increased WOB and using accessory muscles to breathe Explained to family course of therapy and the modalities    Patient with Progressive multiorgan failure with a very high probablity of a very minimal chance of meaningful recovery despite all aggressive and optimal medical therapy.    +Severe Brain damage All organs failing Patient is suffering    Mother understands the situation.   She has  consented and agreed to DNR  status   Family are satisfied with Plan of action and management. All questions answered  MRI AND EXAM C/W BRAIN DAMAGE AND SEIZURES NEUROLOGY AND  CARDIOLOGY UPDATED PATIENT PATIENT IS SUFFERING AND DYING PROCESS  PATIENTS FAMILY HAVE DECIDED ON COMFORT CARE MEASURES  PATIENT DIED 1845     Pertinent Labs and Studies  Significant Diagnostic Studies MR BRAIN WO CONTRAST  Addendum Date: Dec 27, 2021   ADDENDUM REPORT: 12-27-21 16:17 ADDENDUM: I mistakenly described acute infarction/restricted diffusion affecting the globus palladi. In fact this affects the putamena. In addition to the involvement of the caudate nuclei, this is a typical corpus striatum insult pattern. Electronically Signed   By: Nelson Chimes M.D.   On: 12-27-21 16:17   Result Date: December 27, 2021 CLINICAL DATA:  Anoxic brain injury. EXAM: MRI HEAD WITHOUT CONTRAST TECHNIQUE: Multiplanar, multiecho pulse sequences of the brain and surrounding structures were obtained without intravenous contrast. COMPARISON:  Head CT same day. FINDINGS: Brain: Diffusion imaging shows restricted diffusion in both globus palladi consistent with anoxic injury. Probable involvement also of the bodies and heads of the caudate nuclei otherwise, the brain is normal except for an old white matter infarction in the left frontal white matter. No mass, hemorrhage, hydrocephalus or extra-axial collection. Mesial temporal lobes appear normal and symmetric. Vascular: Major vessels at the base of the brain show flow. Skull and upper cervical spine: Negative Sinuses/Orbits: Clear except for mucosal thickening and a small amount of fluid layering in the right maxillary sinus. Other: None IMPRESSION: Restricted diffusion in the globus palladi and caudate nuclei consistent with anoxic injury. No hemorrhage or mass effect. Electronically Signed: By: Nelson Chimes M.D. On: Dec 27, 2021 14:56   EEG adult  Result Date: 12-27-2021 Lora Havens, MD     2021/12/27 12:47 PM Patient Name: KAIYAH EBER MRN: [id number] Epilepsy Attending: Lora Havens Referring Physician/Provider: Rust-Chester, Huel Cote, NP Date:  27-Dec-2021 Duration: 53.13 mins Patient history: 41 year old female status post cardiac arrest.  EEG to evaluate for seizure. Level of alertness:  comatose AEDs during EEG study: Propofol, Versed, Keppra Technical aspects: This EEG study was done with scalp electrodes positioned according to the 10-20 International system of electrode placement. Electrical activity was reviewed with band pass filter of 1-'70Hz'$ , sensitivity of 7 uV/mm, display speed of 57m/sec with a '60Hz'$  notched filter applied as appropriate. EEG data were recorded continuously and digitally stored.  Video monitoring was available and reviewed as appropriate. Description: At the end of the study, patient was noted to have near continuous jerking in bilateral upper and lower extremities as well as neck twitching. After rocuronium was administered, clinical twitching stopped. EEG showed generalized periodic epileptiform discharges at 3 to 4 Hz consistent with myoclonic status epilepticus. In between seizures, EEG showed background suppression.  Hyperventilation and photic stimulation were not performed.   ABNORMALITY - Myoclonic status epilepticus, generalized - Background suppression, generalized IMPRESSION: This study showed evidence of myoclonic status epilepticus as well as profound diffuse encephalopathy.  In the setting of cardiac arrest, this is most likely suggestive of anoxic/hypoxic brain injury with poor prognosis.  Dr. Rory Percy was notified. Priyanka Barbra Sarks   Rapid EEG  Result Date: 2022/01/16 Lora Havens, MD     January 16, 2022 11:36 AM Patient Name: MONIFAH FREEHLING MRN: [id number] Epilepsy Attending: Lora Havens Referring Physician/Provider: Rust-Chester, Huel Cote, NP Duration: 01/02/2022 2200 to 12/26/2021 1035 Patient history: 41 year old female status post cardiac arrest.  EEG to evaluate for seizure. Level of alertness: comatose AEDs during EEG study: Propofol, Versed, Keppra Technical aspects: This EEG was obtained using a  10 lead EEG system positioned circumferentially without any parasagittal coverage (rapid EEG). Computer selected EEG is reviewed as  well as background features and all clinically significant events. Description: At the beginning of the study, EEG showed continuous generalized 3 to 5 Hz theta and delta slowing.  Generalized spikes were noted.  Although video was not available, reportedly patient had whole body jerking. This is concerning for myoclonic seizures in the setting of cardiac arrest.  As medications were adjusted, clinical jerking stopped. EEG then showed burst suppression pattern with bursts of sharply contoured 3 to 5 Hz theta-delta slowing lasting 1 to 2 seconds alternating with 2 to 5 seconds of generalized EEG suppression. On Jan 16, 2022 between 0810 to 0830, sedation was held.  EEG then showed near continuous 3 to 6 Hz theta and delta slowing.  Per neurologist, patient was noted to have twitching in trunk.  Concomitant EEG showed myogenic artifact but no definite epileptiform abnormality was noted.  Sedation was resumed and EEG then showed continuous generalized 3 to 6 Hz theta and delta slowing.  Hyperventilation and photic stimulation were not performed.   ABNORMALITY - Myoclonic seizure, generalized - Burst suppression, generalized IMPRESSION: This limited ceribell EEG was initially suggestive of epileptogenicity with generalized onset.  Reportedly patient had whole body jerking which in the setting of cardiac arrest and EEG findings is concerning for myoclonic seizures. As medications were adjusted, EEG was suggestive of severe to profound diffuse encephalopathy, likely related to sedation. On 16-Jan-2022 between 0810 to 0830, sedation was held.  Patient was noted to have twitching in trunk without concomitant EEG change.  These are most likely not epileptic seizures.  Please consider conventional EEG for further evaluation if clinically indicated. Ordering provider was notified. Lora Havens    ECHOCARDIOGRAM LIMITED  Result Date: Jan 16, 2022    ECHOCARDIOGRAM LIMITED REPORT   Patient Name:   KAITLYND PHILLIPS Blaydes Date of Exam: 12/14/2021 Medical Rec #:  [id number]       Height:       67.0 in Accession #:    [id number]      Weight:       178.1 lb Date of Birth:  05/08/1980       BSA:          1.925 m Patient Age:    59 years        BP:           145/90 mmHg Patient Gender: F               HR:           78 bpm. Exam Location:  [facility] Procedure: 2D Echo, Limited Echo, Limited Color Doppler and Intracardiac  Opacification Agent Indications:     I46.9 Cardiac Arrest  History:         Patient has prior history of Echocardiogram examinations, most                  recent 11/23/2021. CAD; Risk Factors:Hypertension. Chronic                  kidney disease.  Sonographer:     Cresenciano Lick RDCS Referring Phys:  [id number] CHRISTOPHER END Diagnosing Phys: Nelva Bush MD IMPRESSIONS  1. Left ventricular ejection fraction, by estimation, is 25 to 30%. The left ventricle has severely decreased function. The left ventricle demonstrates regional wall motion abnormalities (see scoring diagram/findings for description). The left ventricular internal cavity size was mildly dilated. Left ventricular diastolic parameters are consistent with Grade I diastolic dysfunction (impaired relaxation). There is global hypokinesis with relative sparing of the basal segments.  2. Right ventricular systolic function is low normal. The right ventricular size is normal. Tricuspid regurgitation signal is inadequate for assessing PA pressure.  3. A small pericardial effusion is present. The pericardial effusion is circumferential. There is no evidence of cardiac tamponade.  4. No evidence of mitral valve regurgitation. No evidence of mitral stenosis.  5. There is mild calcification of the aortic valve. There is moderate thickening of the aortic valve. FINDINGS  Left Ventricle: Left ventricular ejection fraction, by estimation, is  25 to 30%. The left ventricle has severely decreased function. The left ventricle demonstrates regional wall motion abnormalities. Definity contrast agent was given IV to delineate the left ventricular endocardial borders. The left ventricular internal cavity size was mildly dilated. There is no left ventricular hypertrophy. Left ventricular diastolic parameters are consistent with Grade I diastolic dysfunction (impaired relaxation).  LV Wall Scoring: There is global hypokinesis with relative sparing of the basal segments. Right Ventricle: The right ventricular size is normal. Right ventricular systolic function is low normal. Tricuspid regurgitation signal is inadequate for assessing PA pressure. Pericardium: A small pericardial effusion is present. The pericardial effusion is circumferential. There is no evidence of cardiac tamponade. Mitral Valve: There is mild thickening of the mitral valve leaflet(s). No evidence of mitral valve stenosis. Tricuspid Valve: The tricuspid valve is not well visualized. Tricuspid valve regurgitation is not demonstrated. Aortic Valve: There is mild calcification of the aortic valve. There is moderate thickening of the aortic valve. Venous: IVC assessment for right atrial pressure unable to be performed due to mechanical ventilation. LEFT VENTRICLE PLAX 2D LVIDd:         5.60 cm Diastology LVIDs:         4.10 cm LV e' medial:    4.35 cm/s LV PW:         1.00 cm LV E/e' medial:  11.8 LV IVS:        0.80 cm LV e' lateral:   4.79 cm/s                        LV E/e' lateral: 10.7  RIGHT VENTRICLE             IVC RV S prime:     11.90 cm/s  IVC diam: 2.10 cm TAPSE (M-mode): 1.7 cm LEFT ATRIUM         Index LA diam:    3.60 cm 1.87 cm/m   AORTA Ao Root diam: 3.20 cm MITRAL VALVE MV Area (PHT): 4.39 cm MV Decel Time: 173 msec MV E velocity: 51.30  cm/s MV A velocity: 65.30 cm/s MV E/A ratio:  0.79 Christopher End MD Electronically signed by Nelva Bush MD Signature Date/Time:  01-15-22/7:04:25 AM    Final    CT HEAD WO CONTRAST (5MM)  Result Date: 01/15/22 CLINICAL DATA:  Seizure disorder, clinical change EXAM: CT HEAD WITHOUT CONTRAST TECHNIQUE: Contiguous axial images were obtained from the base of the skull through the vertex without intravenous contrast. RADIATION DOSE REDUCTION: This exam was performed according to the departmental dose-optimization program which includes automated exposure control, adjustment of the mA and/or kV according to patient size and/or use of iterative reconstruction technique. COMPARISON:  12/26/2021 FINDINGS: Brain: No acute intracranial abnormality. Specifically, no hemorrhage, hydrocephalus, mass lesion, acute infarction, or significant intracranial injury. Vascular: No hyperdense vessel or unexpected calcification. Skull: No acute calvarial abnormality. Sinuses/Orbits: Mucosal thickening throughout the paranasal sinuses. Air-fluid level in the right maxillary sinus. Other: None IMPRESSION: No acute intracranial abnormality. Electronically Signed   By: Rolm Baptise M.D.   On: 01-15-22 00:27   CT Angio Chest PE W/Cm &/Or Wo Cm  Result Date: 01/04/2022 CLINICAL DATA:  Pulmonary embolism suspected. Longstanding diabetes mellitus with end-stage renal disease on peritoneal dialysis. Complicated by peritoneal dialysis catheter failure and peritenonitis. Abdominal pain EXAM: CT ANGIOGRAPHY CHEST CT ABDOMEN AND PELVIS WITH CONTRAST TECHNIQUE: Multidetector CT imaging of the chest was performed using the standard protocol during bolus administration of intravenous contrast. Multiplanar CT image reconstructions and MIPs were obtained to evaluate the vascular anatomy. Multidetector CT imaging of the abdomen and pelvis was performed using the standard protocol during bolus administration of intravenous contrast. RADIATION DOSE REDUCTION: This exam was performed according to the departmental dose-optimization program which includes automated exposure  control, adjustment of the mA and/or kV according to patient size and/or use of iterative reconstruction technique. CONTRAST:  61m OMNIPAQUE IOHEXOL 350 MG/ML SOLN COMPARISON:  None Available. FINDINGS: CTA CHEST FINDINGS Cardiovascular: Satisfactory opacification of the pulmonary arteries to the segmental level. No evidence of pulmonary embolism. Normal heart size. No pericardial effusion. Mediastinum/Nodes: No enlarged mediastinal, hilar, or axillary lymph nodes. Endotracheal tube and feeding tube in place. Thyroid gland, trachea, and esophagus demonstrate no significant findings. Lungs/Pleura: Large bilateral pleural effusions with dependent atelectasis of the bilateral lower lobes. No evidence of pneumonia. Smooth interlobular septal thickening concerning for pulmonary edema. Musculoskeletal: Moderate generalized anasarca. No acute or significant osseous findings. Review of the MIP images confirms the above findings. CT ABDOMEN and PELVIS FINDINGS Hepatobiliary: No focal liver abnormality is seen. No gallstones, gallbladder wall thickening, or biliary dilatation. Pancreas: Mild generalized pancreatic atrophy. No pancreatic ductal dilatation or surrounding inflammatory changes. Spleen: Normal in size without focal abnormality. Adrenals/Urinary Tract: Adrenal glands are unremarkable. Bilateral renal cortical atrophy. No evidence of nephrolithiasis or hydronephrosis. Bladder is contracted about Foley's catheter. Stomach/Bowel: Stomach is normal with NG tube in the body of the stomach. Small bowel loops are normal in caliber. There are fluid-filled colonic loops concerning for ileus. No bowel wall thickening. Vascular/Lymphatic: Moderate aortic atherosclerosis of infrarenal abdominal aorta and branch vessels. No enlarged abdominal or pelvic lymph nodes. Reproductive: Status post hysterectomy. No adnexal masses. Left adnexal cyst measuring up to 1 point 4 cm. Other: Small pelvic ascites. Moderate generalized  anasarca of the abdominal wall. Small fat containing umbilical hernia. Musculoskeletal: No fracture is seen. Review of the MIP images confirms the above findings. IMPRESSION: 1. No evidence of pulmonary embolism. 2. Large bilateral pleural effusions with dependent atelectasis of the bilateral lower lobes. 3. Smooth interlobular  septal thickening concerning for pulmonary edema. 4. Moderate generalized anasarca of the chest  and abdominal wall. 5. Fluid-filled colonic loops concerning for ileus. No evidence of bowel obstruction. 6. Small pelvic ascites. 7. Bilateral renal cortical atrophy. 8. Aortic Atherosclerosis (ICD10-I70.0). Electronically Signed   By: Keane Police D.O.   On: 12/16/2021 17:08   CT Abdomen Pelvis W Contrast  Result Date: 01/07/2022 CLINICAL DATA:  Pulmonary embolism suspected. Longstanding diabetes mellitus with end-stage renal disease on peritoneal dialysis. Complicated by peritoneal dialysis catheter failure and peritenonitis. Abdominal pain EXAM: CT ANGIOGRAPHY CHEST CT ABDOMEN AND PELVIS WITH CONTRAST TECHNIQUE: Multidetector CT imaging of the chest was performed using the standard protocol during bolus administration of intravenous contrast. Multiplanar CT image reconstructions and MIPs were obtained to evaluate the vascular anatomy. Multidetector CT imaging of the abdomen and pelvis was performed using the standard protocol during bolus administration of intravenous contrast. RADIATION DOSE REDUCTION: This exam was performed according to the departmental dose-optimization program which includes automated exposure control, adjustment of the mA and/or kV according to patient size and/or use of iterative reconstruction technique. CONTRAST:  42m OMNIPAQUE IOHEXOL 350 MG/ML SOLN COMPARISON:  None Available. FINDINGS: CTA CHEST FINDINGS Cardiovascular: Satisfactory opacification of the pulmonary arteries to the segmental level. No evidence of pulmonary embolism. Normal heart size. No  pericardial effusion. Mediastinum/Nodes: No enlarged mediastinal, hilar, or axillary lymph nodes. Endotracheal tube and feeding tube in place. Thyroid gland, trachea, and esophagus demonstrate no significant findings. Lungs/Pleura: Large bilateral pleural effusions with dependent atelectasis of the bilateral lower lobes. No evidence of pneumonia. Smooth interlobular septal thickening concerning for pulmonary edema. Musculoskeletal: Moderate generalized anasarca. No acute or significant osseous findings. Review of the MIP images confirms the above findings. CT ABDOMEN and PELVIS FINDINGS Hepatobiliary: No focal liver abnormality is seen. No gallstones, gallbladder wall thickening, or biliary dilatation. Pancreas: Mild generalized pancreatic atrophy. No pancreatic ductal dilatation or surrounding inflammatory changes. Spleen: Normal in size without focal abnormality. Adrenals/Urinary Tract: Adrenal glands are unremarkable. Bilateral renal cortical atrophy. No evidence of nephrolithiasis or hydronephrosis. Bladder is contracted about Foley's catheter. Stomach/Bowel: Stomach is normal with NG tube in the body of the stomach. Small bowel loops are normal in caliber. There are fluid-filled colonic loops concerning for ileus. No bowel wall thickening. Vascular/Lymphatic: Moderate aortic atherosclerosis of infrarenal abdominal aorta and branch vessels. No enlarged abdominal or pelvic lymph nodes. Reproductive: Status post hysterectomy. No adnexal masses. Left adnexal cyst measuring up to 1 point 4 cm. Other: Small pelvic ascites. Moderate generalized anasarca of the abdominal wall. Small fat containing umbilical hernia. Musculoskeletal: No fracture is seen. Review of the MIP images confirms the above findings. IMPRESSION: 1. No evidence of pulmonary embolism. 2. Large bilateral pleural effusions with dependent atelectasis of the bilateral lower lobes. 3. Smooth interlobular septal thickening concerning for pulmonary edema.  4. Moderate generalized anasarca of the chest  and abdominal wall. 5. Fluid-filled colonic loops concerning for ileus. No evidence of bowel obstruction. 6. Small pelvic ascites. 7. Bilateral renal cortical atrophy. 8. Aortic Atherosclerosis (ICD10-I70.0). Electronically Signed   By: IKeane PoliceD.O.   On: 12/17/2021 17:08   CT Head Wo Contrast  Result Date: 12/29/2021 CLINICAL DATA:  Mental status change, unknown cause EXAM: CT HEAD WITHOUT CONTRAST TECHNIQUE: Contiguous axial images were obtained from the base of the skull through the vertex without intravenous contrast. RADIATION DOSE REDUCTION: This exam was performed according to the departmental dose-optimization program which includes automated exposure control, adjustment of the mA and/or  kV according to patient size and/or use of iterative reconstruction technique. COMPARISON:  Head CT 11/20/2021. FINDINGS: Brain: No evidence of acute intracranial hemorrhage or extra-axial collection. No loss of gray-white matter differentiation.No evidence of mass lesion/concerning mass effect.The ventricles are normal in size. Vascular: No hyperdense vessel or unexpected calcification. Skull: Normal. Negative for fracture or focal lesion. Sinuses/Orbits: Mild paranasal sinus mucosal thickening worst in the ethmoid air cells. Other: None. IMPRESSION: No acute intracranial abnormality. Electronically Signed   By: Maurine Simmering M.D.   On: 12/26/2021 16:40   DG Abd Portable 1 View  Result Date: 12/20/2021 CLINICAL DATA:  NG placement EXAM: PORTABLE ABDOMEN - 1 VIEW COMPARISON:  11/20/2021 FINDINGS: NG tube enters the stomach with the tip in the mid body of the stomach. No dilated bowel loops identified. IMPRESSION: NG tip in the body the stomach. Electronically Signed   By: Franchot Gallo M.D.   On: 01/05/2022 16:03   DG Chest Portable 1 View  Result Date: 12/19/2021 CLINICAL DATA:  Intubation EXAM: PORTABLE CHEST 1 VIEW COMPARISON:  Chest 12/18/2021 FINDINGS:  Endotracheal tube in good position. NG tube in the stomach. Left jugular dual lumen central venous catheter with tips in the right atrium unchanged. Diffuse bilateral airspace disease has developed since the prior study. Probable edema. Small bilateral effusions. Mild bibasilar atelectasis IMPRESSION: Endotracheal tube in good position. Diffuse bilateral airspace disease has developed most likely pulmonary edema. Small pleural effusions. Electronically Signed   By: Franchot Gallo M.D.   On: 12/12/2021 16:01   DG Chest 2 View  Result Date: 12/18/2021 CLINICAL DATA:  Shortness of breath EXAM: CHEST - 2 VIEW COMPARISON:  Chest x-ray dated November 25, 2021 FINDINGS: Unchanged cardiomegaly. Left-sided dialysis catheter with tips positioned near the superior cavoatrial junction and in the right atrium. Trace bilateral pleural effusions and atelectasis. Minimal interstitial opacities of the lower lungs. No evidence of pneumothorax. IMPRESSION: 1. Minimal interstitial opacities of the lower lungs, likely mild pulmonary edema. 2. Trace bilateral pleural effusions and atelectasis. Electronically Signed   By: Yetta Glassman M.D.   On: 12/18/2021 16:43   CARDIAC CATHETERIZATION  Result Date: 11/28/2021   Ost LM lesion is 30% stenosed.   Prox LAD lesion is 45% stenosed.   Prox LAD to Mid LAD lesion is 65% stenosed with 90% stenosed side branch in 1st Diag.   Mid LAD lesion is 40% stenosed.  Dist LAD lesion is 50% stenosed.   Prox Cx to Mid Cx lesion is 60% stenosed.   Ost Cx to Prox Cx lesion is 30% stenosed.   Dist RCA lesion is 50% stenosed.   RV Branch lesion is 90% stenosed.  Acute Mrg lesion is 80% stenosed.   RPAV lesion is 70% stenosed with 90% stenosed side branch in 1st RPL.   LV end diastolic pressure is severely elevated.   Hemodynamic findings consistent with moderate-severe pulmonary hypertension.   There is no aortic valve stenosis. 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  LVP-EDP 162/12 mmHg - 40 mmHg, and AoP-MAP of 160/94-121 mmHg Mild to moderate reduction in Cardiac Output-Index  by Thermodilution (4.13-2.23) with mild by Fick (5.07-2.74) RECOMMENDATIONS Return to nursing for ongoing care.  We will discuss with Heart Team in conference in the morning 11/29/2021 to provide further recommendations on potential treatment options.  With CABG as a potential option, would hold off on Thienopyridine antiplatelet agent for now, however if not considered to be a CABG option, would load with diet.  He regardless of PCI or not. Aggressive guideline directed medical therapy as management of risk factors. Glenetta Hew, MD  CT Angio Chest Pulmonary Embolism (PE) W or WO Contrast  Result Date: 11/26/2021 CLINICAL DATA:  Insert epic diagnosis and indication EXAM: CT ANGIOGRAPHY CHEST WITH CONTRAST TECHNIQUE: Multidetector CT imaging of the chest was performed using the standard protocol during bolus administration of intravenous contrast. Multiplanar CT image reconstructions and MIPs were obtained to evaluate the vascular anatomy. RADIATION DOSE REDUCTION: This exam was performed according to the departmental dose-optimization program which includes automated exposure control, adjustment of the mA and/or kV according to patient size and/or use of iterative reconstruction technique. CONTRAST:  88m OMNIPAQUE IOHEXOL 350 MG/ML SOLN COMPARISON:  Radiograph yesterday.  Chest CTA 6 days ago FINDINGS: Cardiovascular: There are no filling defects within the pulmonary arteries to suggest pulmonary embolus. Normal caliber  thoracic aorta with mild atherosclerosis. Cannot assess for dissection given phase of contrast tailored to pulmonary artery assessment. Mild cardiomegaly. Small pericardial effusion has minimally increased. Contrast refluxes into the hepatic veins and IVC. New left-sided internal jugular dialysis catheter, tip at the atrial caval junction. Gas in the overlying left supraclavicular soft tissues likely related to recent placement. Right internal jugular central venous catheter tip in the lower SVC. Mediastinum/Nodes: No suspicious adenopathy or mass. No mediastinal hemorrhage. Interval extubation. Patulous esophagus. Lungs/Pleura: Moderate bilateral pleural effusions have slightly diminished from prior exam. There is subtotal consolidation throughout the left lower lobe. Airspace disease in the dependent right lower lobe may represent compressive atelectasis. Perihilar consolidations have improved. Persistent but improved septal thickening and patchy ground-glass typical of pulmonary edema no pneumothorax. Upper Abdomen: Mild contrast refluxing into the hepatic veins and IVC. Vascular calcifications in the upper abdomen. No acute upper abdominal findings. Musculoskeletal: Again seen buckle fracture of the sternum. No new osseous findings. Review of the MIP images confirms the above findings. IMPRESSION: 1. No pulmonary embolus. 2. Moderate bilateral pleural effusions have slightly diminished from prior exam. 3. Persistent but diminished septal thickening and patchy ground-glass typical of resolving pulmonary edema. 4. Subtotal consolidation throughout the left lower lobe with air bronchograms, atelectasis over pneumonia. Airspace disease in the dependent right lower lobe may represent compressive atelectasis. Improving perihilar consolidations. 5. New left-sided internal jugular dialysis catheter, tip at the atrial caval junction. Gas in the overlying left supraclavicular soft tissues likely related to recent  placement. 6. Mild cardiomegaly. Contrast refluxing into the hepatic veins and IVC consistent with elevated right heart pressures. Small pericardial effusion has minimally increased. Aortic Atherosclerosis (ICD10-I70.0). Electronically Signed   By: MKeith RakeM.D.   On: 11/26/2021 18:25   PERIPHERAL VASCULAR CATHETERIZATION  Result Date: 11/26/2021 See surgical note for result.  DG Chest Port 1 View  Result Date: 11/25/2021 CLINICAL DATA:  Shortness of breath EXAM: PORTABLE CHEST 1 VIEW COMPARISON:  11/24/2021 FINDINGS: Temporary dialysis catheter is again noted on the right and stable. Cardiac shadow is enlarged but stable. Increased central vascular congestion is noted with airspace opacity consistent with pulmonary edema. Small left effusion is noted. No bony abnormality is noted. IMPRESSION: Changes consistent  with vascular congestion and pulmonary edema. The overall appearance is stable from the previous day. Electronically Signed   By: Inez Catalina M.D.   On: 11/25/2021 23:45    Microbiology Recent Results (from the past 240 hour(s))  Urine Culture     Status: None (Preliminary result)   Collection Time: 01/01/2022  3:53 PM   Specimen: In/Out Cath Urine  Result Value Ref Range Status   Specimen Description   Final    IN/OUT CATH URINE Performed at [facility], [address] [place], [place] [address]    Special Requests   Final    NONE Performed at [facility], [address] [place], [place] [address]    Culture   Final    CULTURE REINCUBATED FOR BETTER GROWTH Performed at [facility], [place] [address] [place], [place] [address]    Report Status PENDING  Incomplete  Resp Panel by RT-PCR (Flu A&B, Covid) Anterior Nasal Swab     Status: None   Collection Time: 01/01/2022  3:53 PM   Specimen: Anterior Nasal Swab  Result Value Ref Range Status   SARS Coronavirus 2 by RT PCR NEGATIVE NEGATIVE Final    Comment: (NOTE) SARS-CoV-2 target nucleic  acids are NOT DETECTED.  The SARS-CoV-2 RNA is generally detectable in upper respiratory specimens during the acute phase of infection. The lowest concentration of SARS-CoV-2 viral copies this assay can detect is 138 copies/mL. A negative result does not preclude SARS-Cov-2 infection and should not be used as the sole basis for treatment or other patient management decisions. A negative result may occur with  improper specimen collection/handling, submission of specimen other than nasopharyngeal swab, presence of viral mutation(s) within the areas targeted by this assay, and inadequate number of viral copies(<138 copies/mL). A negative result must be combined with clinical observations, patient history, and epidemiological information. The expected result is Negative.  Fact Sheet for Patients:  EntrepreneurPulse.com.au  Fact Sheet for Healthcare Providers:  IncredibleEmployment.be  This test is no t yet approved or cleared by the [place] FDA and  has been authorized for detection and/or diagnosis of SARS-CoV-2 by FDA under an Emergency Use Authorization (EUA). This EUA will remain  in effect (meaning this test can be used) for the duration of the COVID-19 declaration under Section 564(b)(1) of the Act, 21 U.S.C.section 360bbb-3(b)(1), unless the authorization is terminated  or revoked sooner.       Influenza A by PCR NEGATIVE NEGATIVE Final   Influenza B by PCR NEGATIVE NEGATIVE Final    Comment: (NOTE) The Xpert Xpress SARS-CoV-2/FLU/RSV plus assay is intended as an aid in the diagnosis of influenza from Nasopharyngeal swab specimens and should not be used as a sole basis for treatment. Nasal washings and aspirates are unacceptable for Xpert Xpress SARS-CoV-2/FLU/RSV testing.  Fact Sheet for Patients: EntrepreneurPulse.com.au  Fact Sheet for Healthcare Providers: IncredibleEmployment.be  This  test is not yet approved or cleared by the [place] FDA and has been authorized for detection and/or diagnosis of SARS-CoV-2 by FDA under an Emergency Use Authorization (EUA). This EUA will remain in effect (meaning this test can be used) for the duration of the COVID-19 declaration under Section 564(b)(1) of the Act, 21 U.S.C. section 360bbb-3(b)(1), unless the authorization is terminated or revoked.  Performed at [facility], [address] [place], [place] [address]   Blood Culture (routine x 2)     Status: None (Preliminary result)   Collection Time: 12/27/2021  4:07 PM  Specimen: BLOOD  Result Value Ref Range Status   Specimen Description BLOOD BLOOD LEFT HAND  Final   Special Requests   Final    BOTTLES DRAWN AEROBIC AND ANAEROBIC Blood Culture results may not be optimal due to an inadequate volume of blood received in culture bottles   Culture   Final    NO GROWTH < 24 HOURS Performed at [facility], [address], [place], [place] [address]    Report Status PENDING  Incomplete  Blood Culture (routine x 2)     Status: None (Preliminary result)   Collection Time: 01/05/2022  4:07 PM   Specimen: BLOOD  Result Value Ref Range Status   Specimen Description BLOOD BLOOD RIGHT FOREARM  Final   Special Requests   Final    BOTTLES DRAWN AEROBIC AND ANAEROBIC Blood Culture adequate volume   Culture   Final    NO GROWTH < 24 HOURS Performed at [facility], [address], [place], [place] [address]    Report Status PENDING  Incomplete  MRSA Next Gen by PCR, Nasal     Status: None   Collection Time: 12/19/2021  8:43 PM   Specimen: Nasal Mucosa; Nasal Swab  Result Value Ref Range Status   MRSA by PCR Next Gen NOT DETECTED NOT DETECTED Final    Comment: (NOTE) The GeneXpert MRSA Assay (FDA approved for NASAL specimens only), is one component of a comprehensive MRSA colonization surveillance program. It is not intended to diagnose MRSA infection  nor to guide or monitor treatment for MRSA infections. Test performance is not FDA approved in patients less than 78 years old. Performed at [facility], [address] [place], [place] [address]   MRSA Next Gen by PCR, Nasal     Status: None   Collection Time: 12/09/2021 10:00 PM   Specimen: Nasal Mucosa; Nasal Swab  Result Value Ref Range Status   MRSA by PCR Next Gen NOT DETECTED NOT DETECTED Final    Comment: (NOTE) The GeneXpert MRSA Assay (FDA approved for NASAL specimens only), is one component of a comprehensive MRSA colonization surveillance program. It is not intended to diagnose MRSA infection nor to guide or monitor treatment for MRSA infections. Test performance is not FDA approved in patients less than 48 years old. Performed at [facility], [address] [place], [place] [address]     Lab Basic Metabolic Panel: Recent Labs  Lab 01/05/2022 1533 01/06/2022 1925 01-05-2022 0047 01/05/22 0530  NA 138 139 141  142  --   K 3.6 4.1 3.4*  3.4*  --   CL 103 100 106  107  --   CO2 20* '26 24  24  '$ --   GLUCOSE 233* 162* 98  96  --   BUN '13 16 20  19  '$ --   CREATININE 4.82* 4.99* 5.22*  5.29*  --   CALCIUM 7.7* 8.5* 8.5*  8.3*  --   MG  --  1.8 1.9 1.9  PHOS  --   --  3.3  3.3  --    Liver Function Tests: Recent Labs  Lab 12/23/2021 1925 05-Jan-2022 0047  AST 42* 34  ALT 29 24  ALKPHOS 174* 150*  BILITOT 1.0 0.7  PROT 5.9* 5.2*  ALBUMIN 2.7* 2.5*  2.6*   No results for input(s): "LIPASE", "AMYLASE" in the last 168 hours. No results for input(s): "AMMONIA" in the last 168 hours. CBC: Recent Labs  Lab 12/23/2021 1533 01/05/2022 0047  WBC 8.1 8.9  NEUTROABS 5.2  --   HGB 10.9* 10.2*  HCT 37.0 32.7*  MCV 95.9 91.6  PLT 217 182   Cardiac Enzymes: No results for input(s): "CKTOTAL", "CKMB", "CKMBINDEX", "TROPONINI" in the last 168 hours. Sepsis Labs: Recent Labs  Lab 12/17/2021 1533 12/21/2021 1721 01/03/2022 1925 12-30-2021 0047   PROCALCITON  --   --  0.23 0.58  WBC 8.1  --   --  8.9  LATICACIDVEN >9.0* 2.7*  --   --       Uchechi Denison Dec 30, 2021, 6:46 PM

## 2022-01-08 NOTE — Procedures (Signed)
Patient Name: Nichole Wiley  MRN: [id number]  Epilepsy Attending: Lora Havens  Referring Physician/Provider: Rust-Chester, Huel Cote, NP  Duration: 12/28/2021 2200 to 12/26/2021 1035  Patient history: 41 year old female status post cardiac arrest.  EEG to evaluate for seizure.  Level of alertness: comatose  AEDs during EEG study: Propofol, Versed, Keppra  Technical aspects: This EEG was obtained using a 10 lead EEG system positioned circumferentially without any parasagittal coverage (rapid EEG). Computer selected EEG is reviewed as  well as background features and all clinically significant events.  Description: At the beginning of the study, EEG showed continuous generalized 3 to 5 Hz theta and delta slowing.  Generalized spikes were noted.  Although video was not available, reportedly patient had whole body jerking. This is concerning for myoclonic seizures in the setting of cardiac arrest.  As medications were adjusted, clinical jerking stopped. EEG then showed burst suppression pattern with bursts of sharply contoured 3 to 5 Hz theta-delta slowing lasting 1 to 2 seconds alternating with 2 to 5 seconds of generalized EEG suppression. On 12-31-21 between 0810 to 0830, sedation was held.  EEG then showed near continuous 3 to 6 Hz theta and delta slowing.  Per neurologist, patient was noted to have twitching in trunk.  Concomitant EEG showed myogenic artifact but no definite epileptiform abnormality was noted.  Sedation was resumed and EEG then showed continuous generalized 3 to 6 Hz theta and delta slowing.  Hyperventilation and photic stimulation were not performed.     ABNORMALITY - Myoclonic seizure, generalized - Burst suppression, generalized  IMPRESSION: This limited ceribell EEG was initially suggestive of epileptogenicity with generalized onset.  Reportedly patient had whole body jerking which in the setting of cardiac arrest and EEG findings is concerning for myoclonic  seizures. As medications were adjusted, EEG was suggestive of severe to profound diffuse encephalopathy, likely related to sedation.  On 2021-12-31 between 0810 to 0830, sedation was held.  Patient was noted to have twitching in trunk without concomitant EEG change.  These are most likely not epileptic seizures.  Please consider conventional EEG for further evaluation if clinically indicated.  Ordering provider was notified.  Alonni Heimsoth Barbra Sarks

## 2022-01-08 NOTE — Progress Notes (Signed)
NAME:  Nichole Wiley, MRN:  [id number], DOB:  [birth year], LOS: 1 ADMISSION DATE:  12/16/2021 CONSULTATION DATE:  2022/01/08   CHIEF COMPLAINT:  acute cardiac arrest  History of Present Illness/SYNOPSIS   41 y.o. female with a hx of  diabetes mellitus complicated by end-stage renal disease previously on peritoneal dialysis complicated by PD catheter failure and peritonitis requiring transition to hemodialysis,   severe multivessel coronary artery disease, ischemic cardiomyopathy with recent acute heart failure with mid range ejection fraction, type 1 diabetes mellitus, chronic pain, and prior opioid addiction on chronic methadone complicated by QT prolongation, who is being seen 01/06/2022 for the evaluation of cardiac arrest PCCM ASKED TO ADMIT   Patient is currently intubated and unresponsive. At hemodialysis earlier today when she suddenly became short of breath and hypoxic about 3/4 of the way through her dialysis treatment.    EMS was summoned and found her to be severely hypoxic with oxygen saturations in the 70s.    She suddenly had cardiopulmonary arrest that required 5 rounds of epinephrine, 1 dose of sodium bicarbonate, and approximately 20 minutes of CPR before ROSC was achieved.  CODE STEMI CALLED   PREVIOUS HOSPITALIZATION Last month suffered cardiac arrest in the setting of significant electrolyte derangements from a malfunctioning peritoneal dialysis catheter and acute peritonitis.  +polymorphic VT.  Echo showed moderately reduced LVEF.   right and left heart catheterization that showed moderate to severe three-vessel disease not well suited to PCI as well as moderately-severely elevated left and right heart filling pressures.    Patient now intubated, weaning of sedation Showing signs of severe brain damage based on physical examination Absent COUGH/GAG/CORNEAL REFLEXES and [place] [facility] Events: Including procedures, antibiotic start and  stop dates in addition to other pertinent events   10/17 admitted for acute cardiac arrest ?etiology signs of brain damage 10/18 +seizures +brain damage, multiorgan failure     Micro Data:  COVID PENDING  Antimicrobials:   Antibiotics Given (last 72 hours)     Date/Time Action Medication Dose Rate   12/11/2021 1643 New Bag/Given   ceFEPIme (MAXIPIME) 2 g in sodium chloride 0.9 % 100 mL IVPB 2 g 200 mL/hr   12/22/2021 1648 New Bag/Given   vancomycin (VANCOREADY) IVPB 1750 mg/350 mL 1,750 mg 175 mL/hr            Objective   Blood pressure 92/65, pulse 91, temperature 98.6 F (37 C), resp. rate 18, height '5\' 7"'$  (1.702 m), weight 80.2 kg, SpO2 99 %.    Vent Mode: PRVC FiO2 (%):  [30 %] 30 % Set Rate:  [18 bmp] 18 bmp Vt Set:  [400 mL-490 mL] 490 mL PEEP:  [5 cmH20] 5 cmH20 Plateau Pressure:  [18 cmH20] 18 cmH20   Intake/Output Summary (Last 24 hours) at 2022-01-08 0744 Last data filed at 01/08/22 0400 Gross per 24 hour  Intake 686.21 ml  Output 565 ml  Net 121.21 ml   Filed Weights   12/08/2021 1548 12/09/2021 2145 01-08-2022 0352  Weight: 80.8 kg 80.8 kg 80.2 kg     REVIEW OF SYSTEMS  PATIENT IS UNABLE TO PROVIDE COMPLETE REVIEW OF SYSTEMS DUE TO SEVERE CRITICAL ILLNESS   PHYSICAL EXAMINATION:  GENERAL:critically ill appearing, +resp distress EYES: Pupils equal, round, reactive to light.  No scleral icterus.  MOUTH: Moist mucosal membrane. INTUBATED NECK: Supple.  PULMONARY: Lungs clear to auscultation, +rhonchi, +wheezing CARDIOVASCULAR: S1 and S2.  Regular rate and rhythm GASTROINTESTINAL: Soft, nontender, -distended.  Positive bowel sounds.  MUSCULOSKELETAL: No swelling, clubbing, or edema.  NEUROLOGIC: obtunded,sedated SKIN:normal, warm to touch, Capillary refill delayed  Pulses present bilaterally   Labs/imaging that I havepersonally reviewed  (right click and "Reselect all SmartList Selections" daily)     ASSESSMENT AND PLAN SYNOPSIS  41 yo  white female with multiple comorbid conditions DM and  with 3VD severe CAD with acute and severe hypoxic resp failure leading to acute and sudden cardiac arrest  with severe multiorgan failure, at this time findings are concerning for brain damage  Severe ACUTE Hypoxic and Hypercapnic Respiratory Failure -continue Mechanical Ventilator support -Wean Fio2 and PEEP as tolerated -VAP/VENT bundle implementation - Wean PEEP & FiO2 as tolerated, maintain SpO2 > 88% - Head of bed elevated 30 degrees, VAP protocol in place - Plateau pressures less than 30 cm H20  - Intermittent chest x-ray & ABG PRN - Ensure adequate pulmonary hygiene   Vent Mode: PRVC FiO2 (%):  [30 %] 30 % Set Rate:  [18 bmp] 18 bmp Vt Set:  [400 mL-490 mL] 490 mL PEEP:  [5 cmH20] 5 cmH20 Plateau Pressure:  [18 cmH20] 18 cmH20   NEUROLOGY ACUTE METABOLIC ENCEPHALOPATHY STATUS EPILEPTICUS Follow up neurology recs On ceribell, full EEG to follow Plan for TTM with normothermia protocol   CARDIAC FAILURE-acute combined systolic/diastolic dysfunction Follow up cardiology recs Last echo 11/2021  Left ventricular ejection fraction, by estimation, is 40 to 45%. The  left ventricle has mildly decreased function. The left ventricle  demonstrates global hypokinesis. Left ventricular diastolic parameters are indeterminate. Elevated left ventricular end-diastolic pressure.  High risk for recurrent cardiac arrest vent support Pressors as needed  CARDIAC ICU monitoring  ESRD ON HD -continue Foley Catheter-assess need -Avoid nephrotoxic agents -Follow urine output, BMP -Ensure adequate renal perfusion, optimize oxygenation -Renal dose medications NO HD FOR NOW-WILL INDUCE CARDIAC ARREST AT THIS TIME   Intake/Output Summary (Last 24 hours) at 01/07/22 0751 Last data filed at 01/07/2022 0400 Gross per 24 hour  Intake 686.21 ml  Output 565 ml  Net 121.21 ml     INFECTIOUS DISEASE -continue antibiotics as  prescribed -follow up cultures   ENDO - ICU hypoglycemic\Hyperglycemia protocol -check FSBS per protocol   GI GI PROPHYLAXIS as indicated  NUTRITIONAL STATUS DIET-->TF's as tolerated Constipation protocol as indicated   ELECTROLYTES -follow labs as needed -replace as needed -pharmacy consultation and following  ACUTE ANEMIA- TRANSFUSE AS NEEDED CONSIDER TRANSFUSION  IF HGB<7     Best practice (right click and "Reselect all SmartList Selections" daily)  Diet: NPO Pain/Anxiety/Delirium protocol (if indicated): No VAP protocol (if indicated): Yes DVT prophylaxis: Subcutaneous Heparin GI prophylaxis: PPI Mobility:  bed rest  Code Status:  FULL Disposition:ICU  Labs   CBC: Recent Labs  Lab 12/18/21 1612 12/18/2021 1533 01-07-22 0047  WBC 6.2 8.1 8.9  NEUTROABS  --  5.2  --   HGB 10.7* 10.9* 10.2*  HCT 34.8* 37.0 32.7*  MCV 92.6 95.9 91.6  PLT 243 217 182     Basic Metabolic Panel: Recent Labs  Lab 12/18/21 1612 12/09/2021 1533 12/23/2021 1925 01/07/22 0047  NA 134* 138 139 141  142  K 3.9 3.6 4.1 3.4*  3.4*  CL 100 103 100 106  107  CO2 24 20* '26 24  24  '$ GLUCOSE 116* 233* 162* 98  96  BUN 23* '13 16 20  19  '$ CREATININE 6.37* 4.82* 4.99* 5.22*  5.29*  CALCIUM 8.7* 7.7* 8.5* 8.5*  8.3*  MG  --   --  1.8 1.9  PHOS  --   --   --  3.3  3.3    GFR: Estimated Creatinine Clearance: 14.9 mL/min (A) (by C-G formula based on SCr of 5.29 mg/dL (H)). Recent Labs  Lab 12/18/21 1612 12/16/2021 1533 12/08/2021 1721 01/07/2022 1925 12/31/2021 0047  PROCALCITON  --   --   --  0.23 0.58  WBC 6.2 8.1  --   --  8.9  LATICACIDVEN  --  >9.0* 2.7*  --   --      Liver Function Tests: Recent Labs  Lab 12/15/2021 1925 2021/12/31 0047  AST 42* 34  ALT 29 24  ALKPHOS 174* 150*  BILITOT 1.0 0.7  PROT 5.9* 5.2*  ALBUMIN 2.7* 2.5*  2.6*   No results for input(s): "LIPASE", "AMYLASE" in the last 168 hours. No results for input(s): "AMMONIA" in the last 168  hours.  ABG    Component Value Date/Time   PHART 7.42 12/31/2021 1925   PCO2ART 38 12/20/2021 1925   PO2ART 82 (L) 01/03/2022 1925   HCO3 24.6 01/03/2022 1925   ACIDBASEDEF 0.9 11/25/2021 2323   O2SAT 98.7 12/23/2021 1925     Coagulation Profile: Recent Labs  Lab 12/10/2021 1533  INR 1.2     Cardiac Enzymes: No results for input(s): "CKTOTAL", "CKMB", "CKMBINDEX", "TROPONINI" in the last 168 hours.  HbA1C: Hemoglobin A1C  Date/Time Value Ref Range Status  04/27/2021 12:00 AM 5  Final    Comment:    care everywhere   Hgb A1c MFr Bld  Date/Time Value Ref Range Status  10/01/2021 02:55 AM 4.8 4.8 - 5.6 % Final    Comment:    (NOTE) Pre diabetes:          5.7%-6.4%  Diabetes:              >6.4%  Glycemic control for   <7.0% adults with diabetes     CBG: Recent Labs  Lab 12/19/2021 2143 Dec 31, 2021 0026 12/31/2021 0331 12-31-21 0553  GLUCAP 107* 91 74 67*     Past Medical History:  She,  has a past medical history of Abdominal mass, Arthritis, Chronic kidney disease, Colon polyp, Coronary artery disease (11/2021), Diabetes 1.5, managed as type 1 ([place]), Drug addiction ([place]), GERD (gastroesophageal reflux disease), Heart failure with mildly reduced ejection fraction (HFmrEF) ([place]) (11/2021), Hypertension, Pelvic floor dysfunction, and Urine incontinence.   Surgical History:   Past Surgical History:  Procedure Laterality Date   ABDOMINAL HYSTERECTOMY  2005   CAPD REMOVAL N/A 11/22/2021   Procedure: CONTINUOUS AMBULATORY PERITONEAL DIALYSIS  (CAPD) CATHETER REMOVAL;  Surgeon: Benjamine Sprague, DO;  Location: [facility];  Service: General;  Laterality: N/A;   CESAREAN SECTION  2003   COLONOSCOPY WITH PROPOFOL N/A 09/14/2015   Procedure: COLONOSCOPY WITH PROPOFOL;  Surgeon: Lucilla Lame, MD;  Location: [place];  Service: Endoscopy;  Laterality: N/A;  Diabetic - insulin   CYSTOSCOPY W/ RETROGRADES Right 07/06/2017   Procedure: CYSTOSCOPY WITH RETROGRADE PYELOGRAM;   Surgeon: Hollice Espy, MD;  Location: [facility];  Service: Urology;  Laterality: Right;   CYSTOSCOPY W/ URETERAL STENT PLACEMENT Right 06/06/2017   Procedure: CYSTOSCOPY WITH RETROGRADE PYELOGRAM/URETERAL STENT PLACEMENT;  Surgeon: Lucas Mallow, MD;  Location: [facility];  Service: Urology;  Laterality: Right;   CYSTOSCOPY W/ URETERAL STENT PLACEMENT Right 07/06/2017   Procedure: CYSTOSCOPY WITH STENT REPLACEMENT;  Surgeon: Hollice Espy, MD;  Location: [facility];  Service: Urology;  Laterality: Right;   DIALYSIS/PERMA CATHETER INSERTION N/A 11/26/2021  Procedure: DIALYSIS/PERMA CATHETER INSERTION;  Surgeon: Katha Cabal, MD;  Location: [place] [facility];  Service: Cardiovascular;  Laterality: N/A;   ESOPHAGOGASTRODUODENOSCOPY (EGD) WITH PROPOFOL N/A 06/19/2016   Procedure: ESOPHAGOGASTRODUODENOSCOPY (EGD) WITH PROPOFOL;  Surgeon: Jonathon Bellows, MD;  Location: [facility];  Service: Endoscopy;  Laterality: N/A;   LAPAROSCOPY     LYSIS OF ADHESION     POLYPECTOMY  09/14/2015   Procedure: POLYPECTOMY;  Surgeon: Lucilla Lame, MD;  Location: [place];  Service: Endoscopy;;   RIGHT OOPHORECTOMY  2012   RIGHT/LEFT HEART CATH AND CORONARY ANGIOGRAPHY N/A 11/28/2021   Procedure: RIGHT/LEFT HEART CATH AND CORONARY ANGIOGRAPHY;  Surgeon: Leonie Man, MD;  Location: [place] [facility];  Service: Cardiovascular;  Laterality: N/A;   TEMPORARY DIALYSIS CATHETER N/A 10/01/2021   Procedure: TEMPORARY DIALYSIS CATHETER;  Surgeon: Algernon Huxley, MD;  Location: [place] [facility];  Service: Cardiovascular;  Laterality: N/A;   TONSILLECTOMY  1987   and adenoids   TUBAL LIGATION  2004   VULVECTOMY  2011   lipoma     Social History:   reports that she has been smoking cigarettes. She has a 6.00 pack-year smoking history. She has never used smokeless tobacco. She reports that she does not drink alcohol and does not use drugs.   Family History:  Her family history includes Dementia in  her maternal grandmother; Depression in her maternal grandmother and mother; Diabetes in her father, maternal grandmother, and paternal grandmother; Heart disease in her maternal grandmother.   Allergies Allergies  Allergen Reactions   Cephalexin Other (See Comments) and Rash    Unknown   Penicillins Rash    Has patient had a PCN reaction causing immediate rash, facial/tongue/throat swelling, SOB or lightheadedness with hypotension: No Has patient had a PCN reaction causing severe rash involving mucus membranes or skin necrosis: No Has patient had a PCN reaction that required hospitalization: No Has patient had a PCN reaction occurring within the last 10 years: No If all of the above answers are "NO", then may proceed with Cephalosporin use.     Home Medications  Prior to Admission medications   Medication Sig Start Date End Date Taking? Authorizing Provider  aspirin EC 81 MG tablet Take 81 mg by mouth daily.   Yes [provider]  atorvastatin (LIPITOR) 80 MG tablet Take 80 mg by mouth daily. 12/04/21  Yes [provider]  carvedilol (COREG) 12.5 MG tablet Take 12.5 mg by mouth 2 (two) times daily. 12/04/21  Yes [provider]  levothyroxine (SYNTHROID) 25 MCG tablet Take 1 tablet (25 mcg total) by mouth daily. 08/08/21  Yes Baity, Coralie Keens, NP  losartan (COZAAR) 25 MG tablet Take 1 tablet (25 mg total) by mouth daily. 12/11/21 04/10/22 Yes End, Harrell Gave, MD  metolazone (ZAROXOLYN) 2.5 MG tablet Take 1 tablet (2.5 mg total) by mouth every other day. 10/29/21  Yes Jearld Fenton, NP  sertraline (ZOLOFT) 25 MG tablet Take 1 tablet by mouth daily. 12/06/21  Yes [provider]  Accu-Chek Softclix Lancets lancets Use to check blood sugar daily for type 2 diabetes. E11.9 Patient not taking: Reported on 12/11/2021 06/27/21   Jearld Fenton, NP  amLODipine (NORVASC) 10 MG tablet Take 1 tablet (10 mg total) by mouth daily. Patient not taking: Reported on 11/18/2021  10/04/21   Fritzi Mandes, MD  gentamicin cream (GARAMYCIN) 0.1 % Apply 1 Application topically daily. Patient not taking: Reported on 11/18/2021 10/04/21   Fritzi Mandes,  MD  glucose blood (ACCU-CHEK GUIDE) test strip 1 each by Other route See admin instructions. Patient not taking: Reported on 12/11/2021 07/08/21   Jearld Fenton, NP  labetalol (NORMODYNE) 100 MG tablet Take 1 tablet (100 mg total) by mouth 3 (three) times daily. Patient not taking: Reported on 11/18/2021 10/03/21   Fritzi Mandes, MD  methadone (DOLOPHINE) 10 MG/ML solution Take 20 mg by mouth daily. Changed by Dr Posey Pronto to get the right home dose w/o sending rx Patient not taking: Reported on 12/18/2021    [provider]  naloxone [facility]) nasal spray 4 mg/0.1 mL One spray in either nostril once for known/suspected opioid overdose. May repeat every 2-3 minutes in alternating nostril til EMS arrives Patient not taking: Reported on 12/11/2021 04/29/21   [provider]  nicotine (NICODERM CQ - DOSED IN MG/24 HOURS) 21 mg/24hr patch 21 mg daily. 12/05/21   [provider]  omeprazole (PRILOSEC) 20 MG capsule Take 1 capsule (20 mg total) by mouth daily. Patient not taking: Reported on 12/11/2021 08/07/21   Jearld Fenton, NP  torsemide (DEMADEX) 100 MG tablet Take 1 tablet (100 mg total) by mouth daily. Patient not taking: Reported on 12/11/2021 11/19/21   Hinda Kehr, MD       PATIENT WITH VERY POOR PROGNOSIS I ANTICIPATE PROLONGED ICU LOS    DVT/GI PRX  assessed I Assessed the need for Labs I Assessed the need for Foley I Assessed the need for Central Venous Line Family Discussion when available I Assessed the need for Mobilization I made an Assessment of medications to be adjusted accordingly Safety Risk assessment completed  CASE DISCUSSED IN MULTIDISCIPLINARY ROUNDS WITH ICU TEAM     Critical Care Time devoted to patient care services described in this note is 54 minutes.  Critical care was  necessary to treat /prevent imminent and life-threatening deterioration. Overall, patient is critically ill, prognosis is guarded.  Patient with Multiorgan failure and at high risk for cardiac arrest and death.    Corrin Parker, M.D.  [facility]  Medical Director [place] Director [facility] Cardio-Pulmonary Department

## 2022-01-08 NOTE — Progress Notes (Signed)
Same-day progress note  Upon holding sedation for the EEG, patient started having whole body myoclonic jerking.  EEG revealed myoclonic status epilepticus and profound diffuse encephalopathy with generalized ground suppression.  Given history of cardiac arrest and possible anoxic injury, likely EEG findings suggestive of anoxic/anoxic brain injury with poor prognosis.  I discussed the findings with the family-mother and daughter at the bedside.  The mother said that the patient had a strong will to live and would want everything done to survive.  The daughter on the other hand was more realistic and said that if there is no hope for survival or neurologically meaningful recovery, her mother would not want to live.  I offered transfer to [facility] for LTM to see what we can do to treat the myoclonic status epilepticus.  I have already ordered a load of phenobarbital and resumption of propofol for symptomatic management of the myoclonic seizures.  The family-daughter and mother are somewhat torn in making the decisions going forward but I did recommend that they call other family members who might want to see the patient.    The daughter specifically asked if transferring her to [facility] may change any outcome and looking at her clinical examination as well as EEG findings, I told her that realistically that might not be possible.  I did offer them the option of doing an MRI and if that already shows evidence of anoxic brain injury along with the poor clinical exam and the poor findings on EEG, that would tell us that the chances of neurologically meaningful recovery are extremely grim and at that point they can pursue comfort measures versus if the MRI did not show any acute changes, it might be worthwhile to monitor her on LTM and try to control the myoclonic status epilepticus and observe her for the next few days for any chances of clinical improvement.  The family was amenable.  MRI brain  has been ordered and is being done at this time. I will addend this note with my recommendations as soon as the MRI becomes available.  I discussed this plan in detail with the patient's daughter, patient's mother and Dr. Mortimer Fries, as our epileptologist Dr. Hortense Ramal over the phone.  Addendum  MRI brain completed and reviewed: There is evidence of restricted diffusion in the globus pallidus and cardiac nuclei bilaterally along with restricted diffusion in the external capsule bilaterally symmetrically consistent with anoxic brain injury.  After review of the MRI, it had a detailed discussion with the daughter and the mother.  They both understand that she is critically sick with very poor chances of neurological meaningful recovery especially because of her multisystem involvement including cardiac and renal derangements and now with postcardiac arrest anoxic brain injury which is evident by a poor neurological exam, EEG findings and corroborated by the MRI findings.  I offered them to meet with palliative medicine which they already are going to at this point and offered to consider withdrawing care because of no realistic chance of neurologically meaningful recovery past this point.  The family is strongly considering comfort measures only.  They are waiting for the cardiology team to speak with them.  I discussed my plan and details of my family meeting with Dr. Mortimer Fries, and Asencion Gowda from the palliative medicine team.  Please do not hesitate to call me with questions.  -- Amie Portland, MD Neurologist [facility] Pager: [phone]      Additional CC time 45 min
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
## 2022-01-08 DEATH — deceased

## 2022-01-22 ENCOUNTER — Ambulatory Visit: Payer: Medicaid Other | Admitting: Family Medicine

## 2022-05-06 IMAGING — US US BREAST*L* LIMITED INC AXILLA
1 series · 5 of 5 positions shown · non-contrast
Comparison: None.

CLINICAL DATA: Recent nipple pain and swelling bilaterally. The
symptoms have resolved.

EXAM:
DIGITAL DIAGNOSTIC BILATERAL MAMMOGRAM WITH CAD AND TOMO
ULTRASOUND LEFT BREAST

[Series 1: us breast*left* limited inc axilla · 0.07mm/px · 5 of 5 slices shown]
[im 1/5]
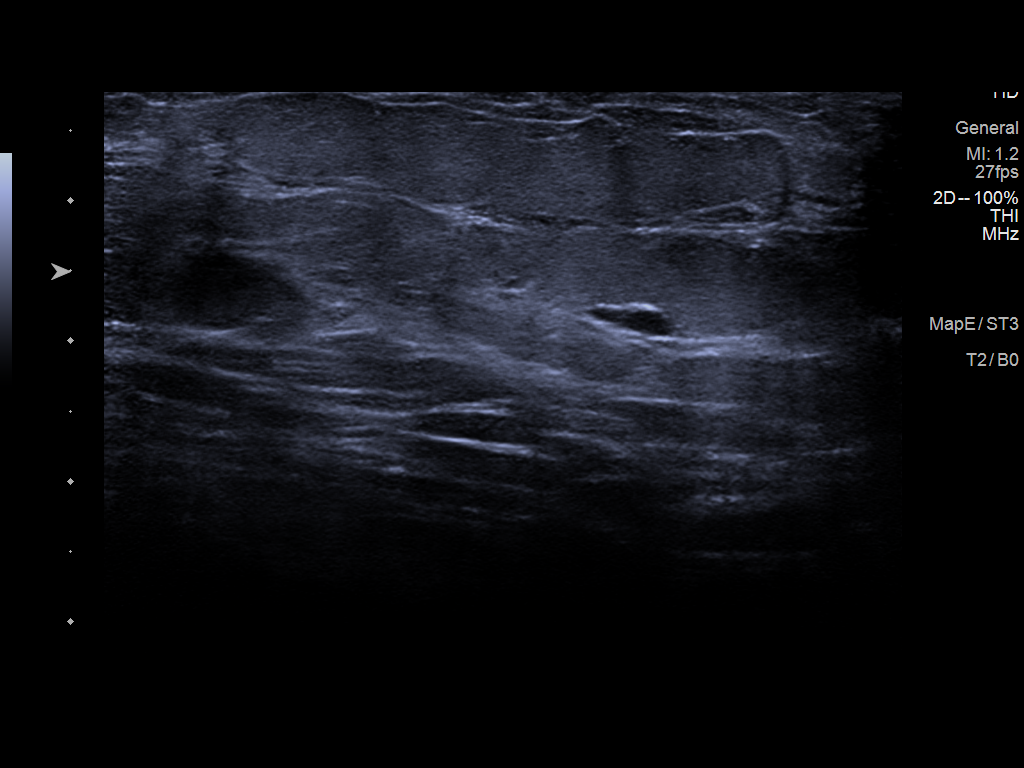
[im 2/5]
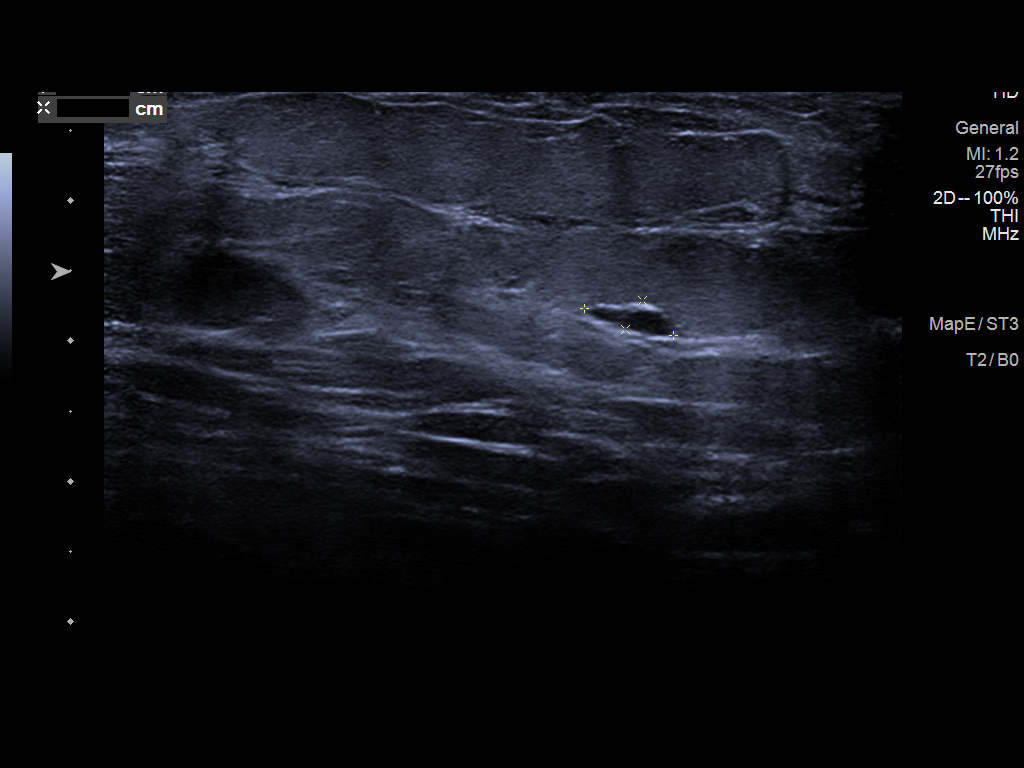
[im 3/5]
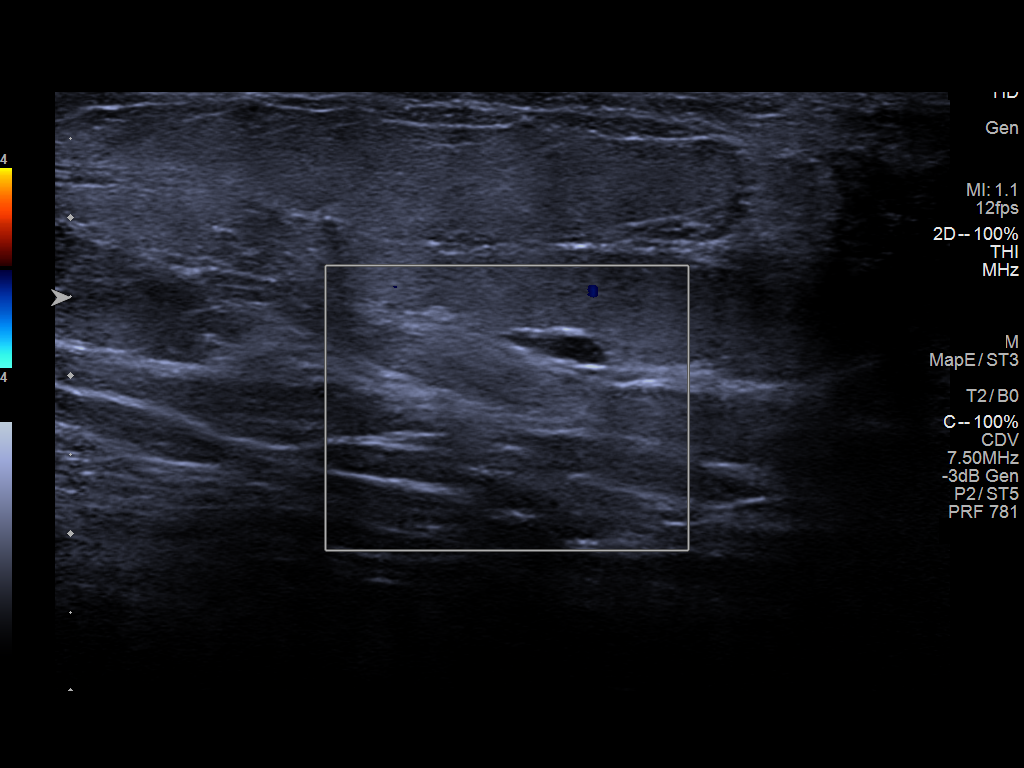
[im 4/5]
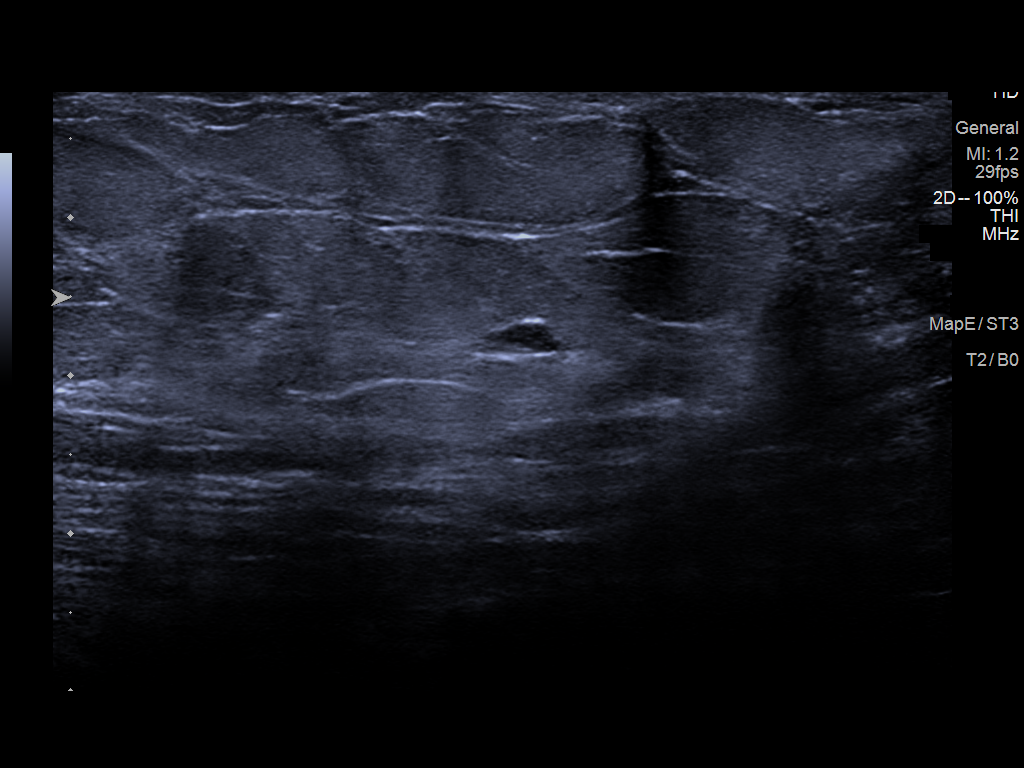
[im 5/5]
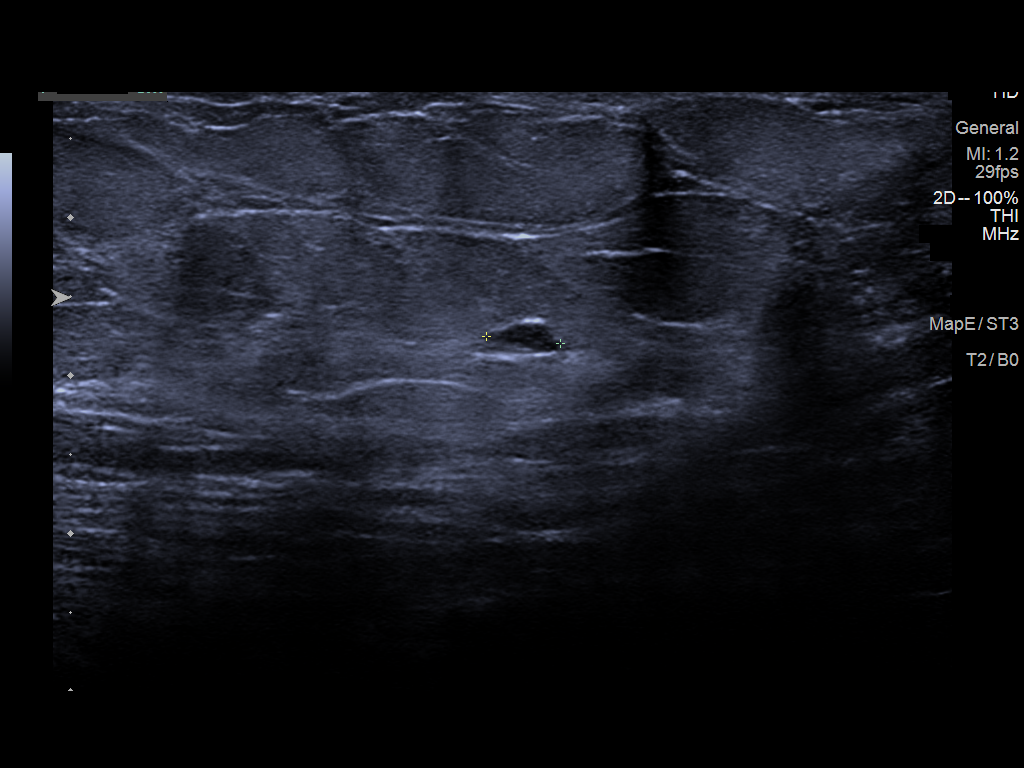

[5 of 5 positions shown; findings below may reference images not displayed]

ACR Breast Density Category c: The breast tissue is heterogeneously
dense, which may obscure small masses.
FINDINGS: There is a small obscured mass in the slightly medial left breast.
No other suspicious findings are seen in either breast.

Mammographic images were processed with CAD.

On physical exam, no suspicious lumps are identified.

Targeted ultrasound is performed, showing a small cyst at 9 o'clock
in the left breast correlating with the mammographic findings. No
evidence of malignancy.
IMPRESSION: No mammographic or sonographic evidence of malignancy.

RECOMMENDATION:
Annual screening mammography.

I have discussed the findings and recommendations with the patient.
If applicable, a reminder letter will be sent to the patient
regarding the next appointment.

BI-RADS CATEGORY  2: Benign.

## 2024-05-05 IMAGING — CT CT ABD-PELV W/O CM
2 of 4 series · 16 of 46 positions shown, 18 images · non-contrast
Comparison: 06/06/2017

CLINICAL DATA: Nausea and vomiting, anemia

EXAM:
CT ABDOMEN AND PELVIS WITHOUT CONTRAST
TECHNIQUE: Multidetector CT imaging of the abdomen and pelvis was performed
following the standard protocol without IV contrast. Unenhanced CT
was performed per clinician order. Lack of IV contrast limits
sensitivity and specificity, especially for evaluation of
abdominal/pelvic solid viscera.
RADIATION DOSE REDUCTION: This exam was performed according to the
departmental dose-optimization program which includes automated
exposure control, adjustment of the mA and/or kV according to
patient size and/or use of iterative reconstruction technique.

[Series 2: ap without · axial · non-contrast · 0.93mm/px · z∈[-1008,-518]mm · 13 of 110 slices shown, 15 images]
[im 6/110  soft-tissue]
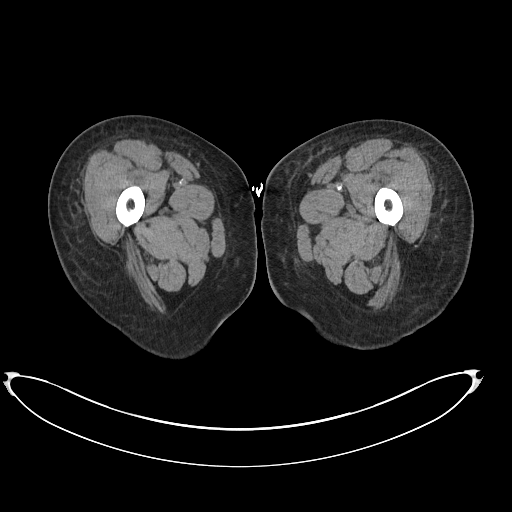
[im 6/110  bone]
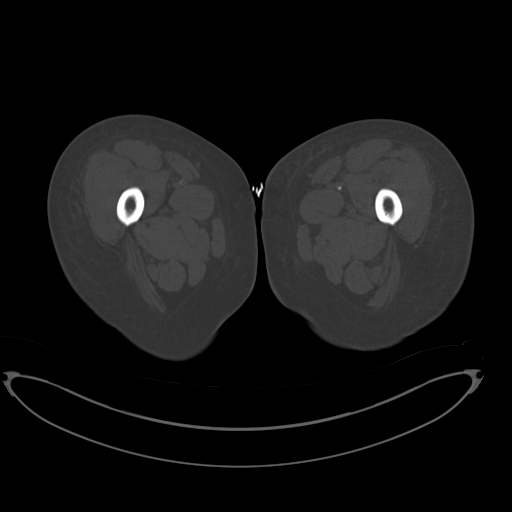
[im 18/110  soft-tissue]
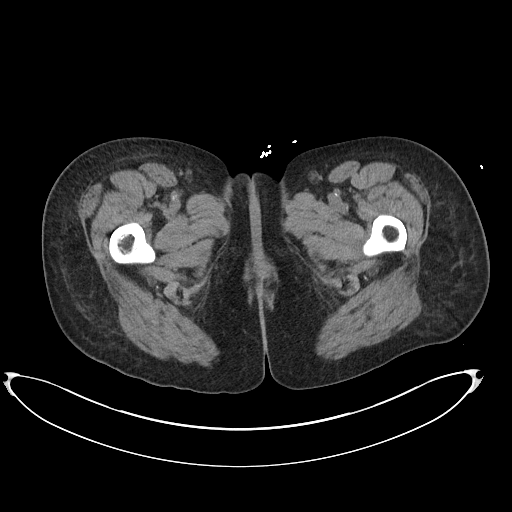
[im 23/110  soft-tissue]
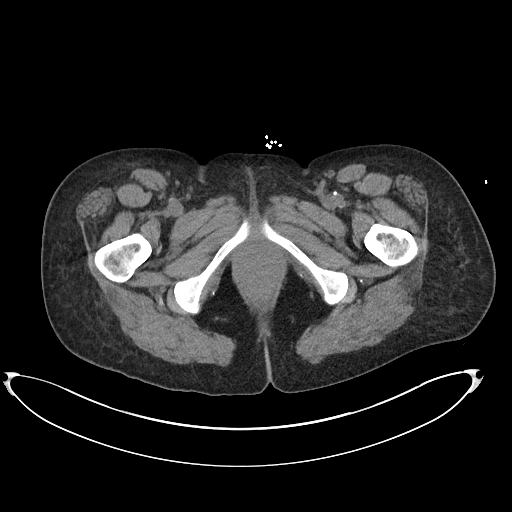
[im 29/110  soft-tissue]
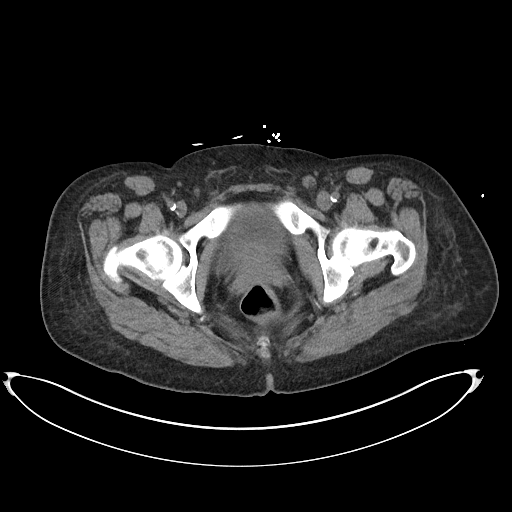
[im 41/110  soft-tissue]
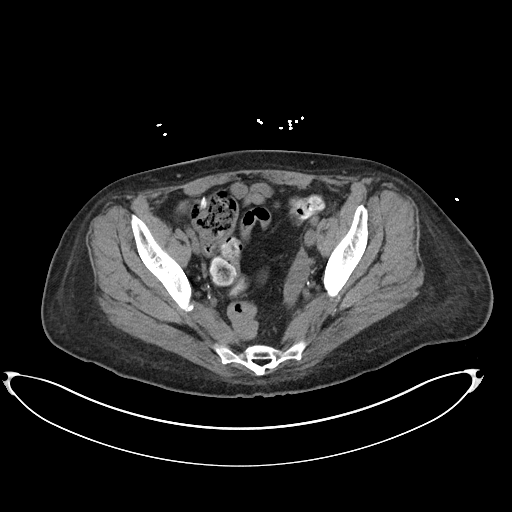
[im 46/110  soft-tissue]
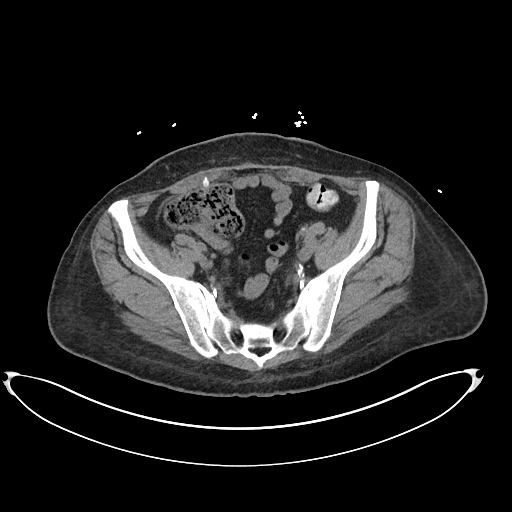
[im 58/110  soft-tissue]
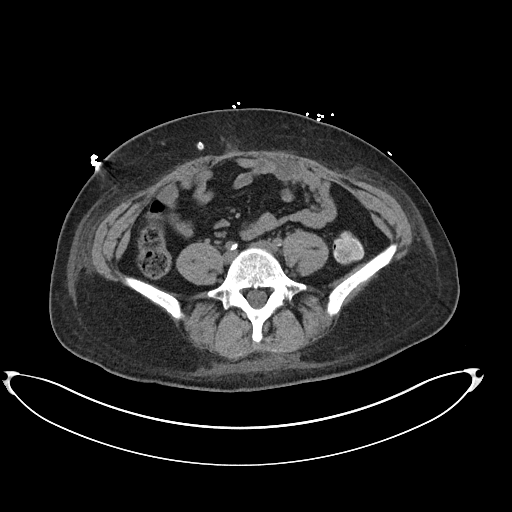
[im 64/110  soft-tissue]
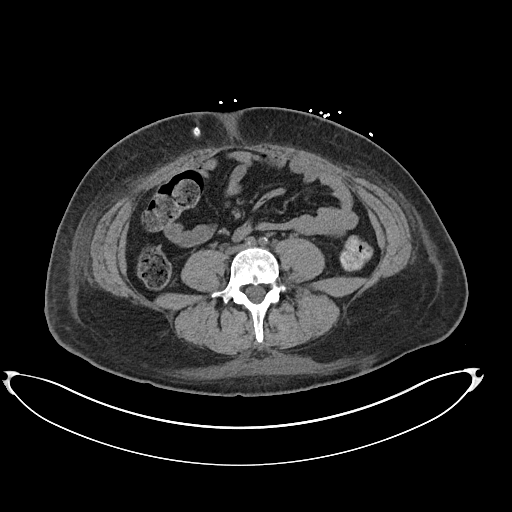
[im 69/110  soft-tissue]
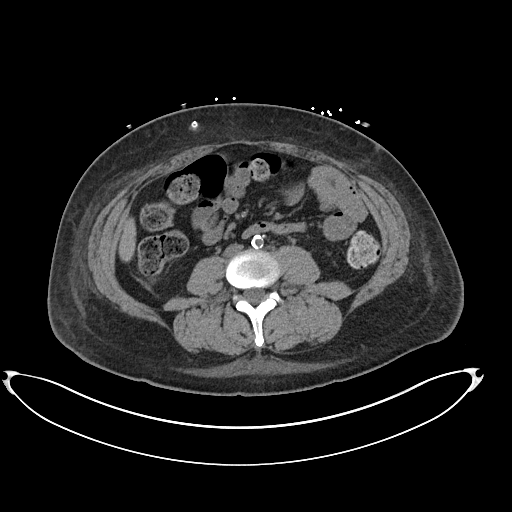
[im 69/110  bone]
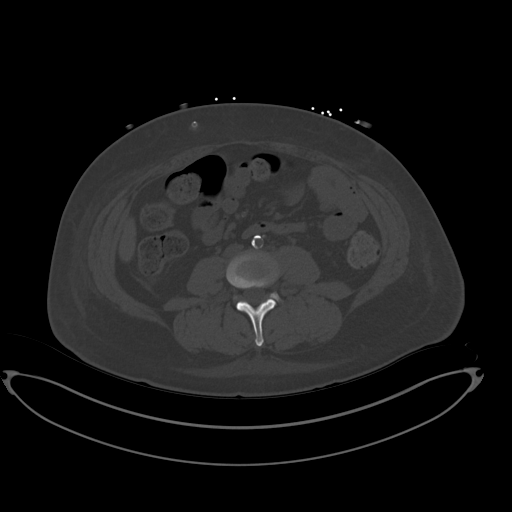
[im 81/110  soft-tissue]
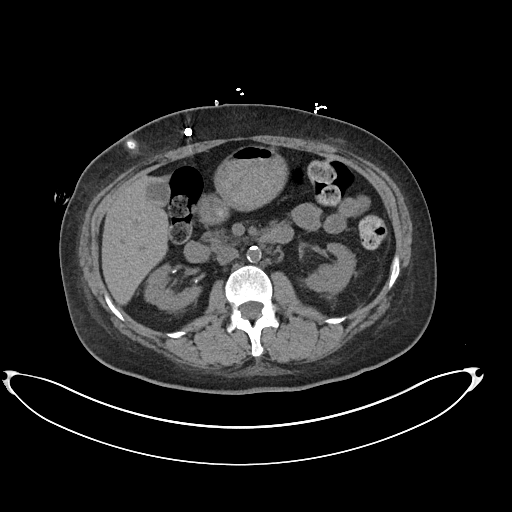
[im 87/110  soft-tissue]
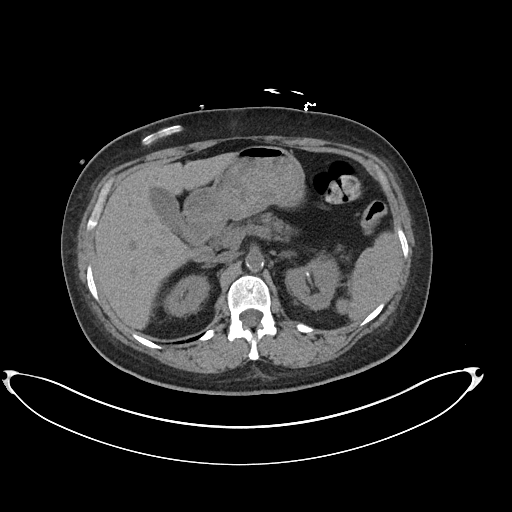
[im 92/110  soft-tissue]
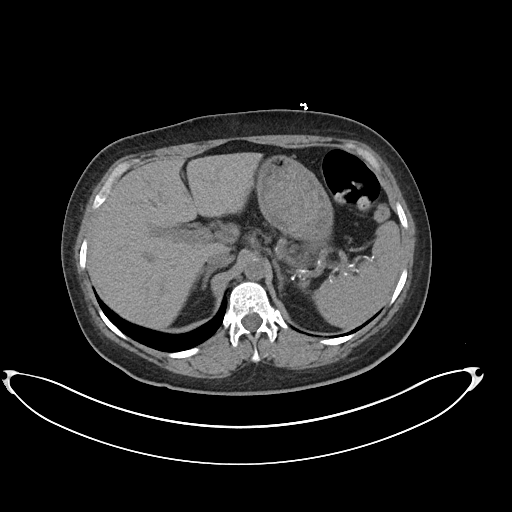
[im 104/110  soft-tissue]
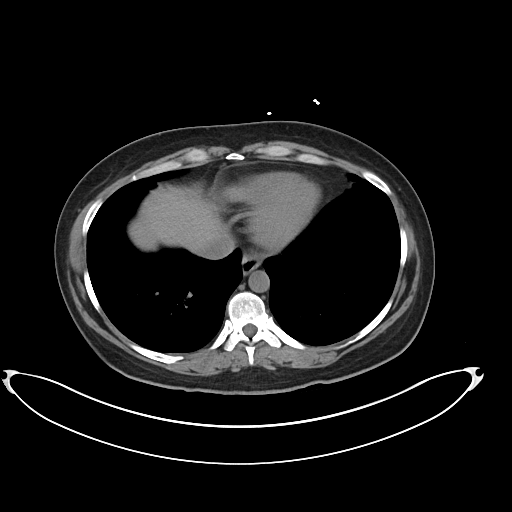

[Series 5: cor · coronal · 0.83mm/px · 3 of 95 slices shown]
[im 32/95  soft-tissue]
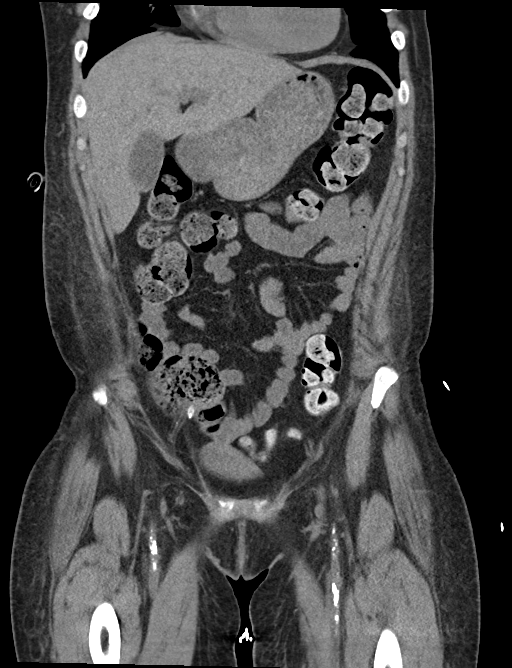
[im 42/95  soft-tissue]
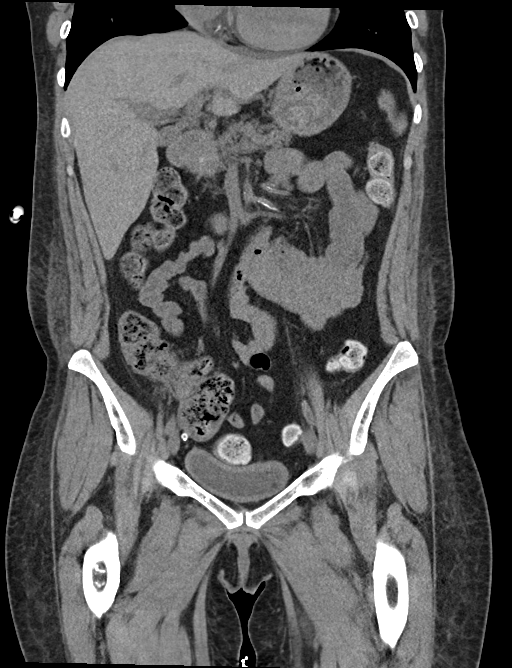
[im 53/95  soft-tissue]
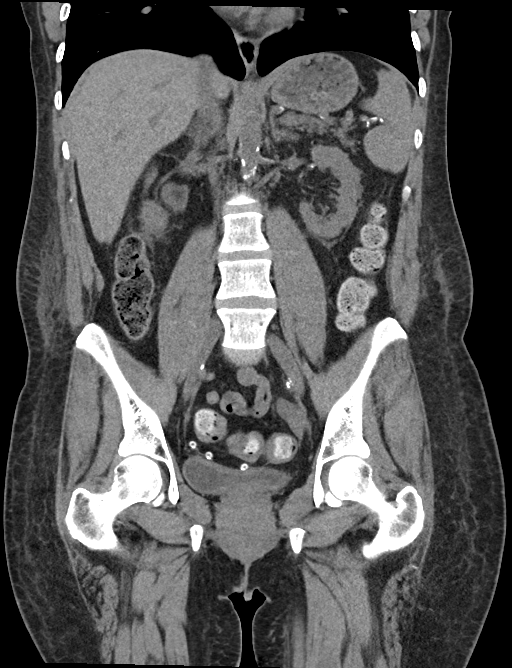

[16 of 46 positions shown; findings below may reference images not displayed]

FINDINGS: Lower chest: No acute pleural or parenchymal lung disease.

Hepatobiliary: Small gallstones within the gallbladder. No evidence
of acute cholecystitis. Unremarkable unenhanced appearance of the
liver.

Pancreas: Unremarkable unenhanced appearance.

Spleen: Unremarkable unenhanced appearance.

Adrenals/Urinary Tract: No urinary tract calculi or obstructive
uropathy within either kidney. The adrenals and bladder are
unremarkable.

Stomach/Bowel: No bowel obstruction or ileus. Normal retrocecal
appendix. No bowel wall thickening or inflammatory change.

Vascular/Lymphatic: Aortic atherosclerosis. No enlarged abdominal or
pelvic lymph nodes.

Reproductive: Status post hysterectomy. No adnexal masses.

Other: Trace pelvic free fluid. No free intraperitoneal gas.
Peritoneal dialysis catheter coiled within the lower pelvis. No
abdominal wall hernia.

Musculoskeletal: No acute or destructive bony lesions. Reconstructed
images demonstrate no additional findings.
IMPRESSION: 1. Cholelithiasis without acute cholecystitis.
2. No bowel obstruction or ileus.
3.  Aortic Atherosclerosis ([id number]).
